# Patient Record
Sex: Female | Born: 1965 | Race: Black or African American | Hispanic: No | Marital: Single | Smoking: Never smoker
Health system: Southern US, Community
[De-identification: ages and names within clinical notes are randomized; demographics above are authoritative.]

## PROBLEM LIST (undated history)

## (undated) DIAGNOSIS — I509 Heart failure, unspecified: Secondary | ICD-10-CM

## (undated) DIAGNOSIS — I471 Supraventricular tachycardia: Secondary | ICD-10-CM

## (undated) DIAGNOSIS — I499 Cardiac arrhythmia, unspecified: Secondary | ICD-10-CM

## (undated) DIAGNOSIS — I110 Hypertensive heart disease with heart failure: Secondary | ICD-10-CM

## (undated) DIAGNOSIS — Z95810 Presence of automatic (implantable) cardiac defibrillator: Secondary | ICD-10-CM

## (undated) DIAGNOSIS — I4719 Other supraventricular tachycardia: Secondary | ICD-10-CM

## (undated) DIAGNOSIS — N920 Excessive and frequent menstruation with regular cycle: Secondary | ICD-10-CM

## (undated) DIAGNOSIS — I5022 Chronic systolic (congestive) heart failure: Secondary | ICD-10-CM

## (undated) DIAGNOSIS — D649 Anemia, unspecified: Secondary | ICD-10-CM

## (undated) DIAGNOSIS — I10 Essential (primary) hypertension: Secondary | ICD-10-CM

## (undated) HISTORY — PX: TUBAL LIGATION: SHX77

## (undated) HISTORY — DX: Excessive and frequent menstruation with regular cycle: N92.0

## (undated) HISTORY — DX: Other supraventricular tachycardia: I47.19

## (undated) HISTORY — DX: Chronic systolic (congestive) heart failure: I50.22

## (undated) HISTORY — DX: Supraventricular tachycardia: I47.1

## (undated) HISTORY — DX: Hypertensive heart disease with heart failure: I11.0

## (undated) HISTORY — PX: APPENDECTOMY: SHX54

---

## 1999-05-02 ENCOUNTER — Inpatient Hospital Stay (HOSPITAL_COMMUNITY): Admission: EM | Admit: 1999-05-02 | Discharge: 1999-05-04 | Payer: Self-pay | Admitting: Emergency Medicine

## 1999-05-02 ENCOUNTER — Encounter: Payer: Self-pay | Admitting: General Surgery

## 1999-05-03 ENCOUNTER — Encounter: Payer: Self-pay | Admitting: General Surgery

## 2002-07-30 ENCOUNTER — Inpatient Hospital Stay (HOSPITAL_COMMUNITY): Admission: EM | Admit: 2002-07-30 | Discharge: 2002-07-31 | Payer: Self-pay | Admitting: Emergency Medicine

## 2002-07-30 ENCOUNTER — Encounter: Payer: Self-pay | Admitting: Orthopedic Surgery

## 2008-09-28 ENCOUNTER — Ambulatory Visit: Payer: Self-pay | Admitting: Internal Medicine

## 2008-09-28 LAB — CONVERTED CEMR LAB
Basophils Relative: 0 % (ref 0–1)
Direct LDL: 55 mg/dL
Lymphs Abs: 2.2 10*3/uL (ref 0.7–4.0)
Monocytes Relative: 4 % (ref 3–12)
Neutro Abs: 6.1 10*3/uL (ref 1.7–7.7)
Neutrophils Relative %: 67 % (ref 43–77)
Platelets: 374 10*3/uL (ref 150–400)
RBC: 4.2 M/uL (ref 3.87–5.11)
TSH: 1.681 microintl units/mL (ref 0.350–4.50)
WBC: 9 10*3/uL (ref 4.0–10.5)

## 2009-01-31 ENCOUNTER — Emergency Department (HOSPITAL_COMMUNITY): Admission: EM | Admit: 2009-01-31 | Discharge: 2009-01-31 | Payer: Self-pay | Admitting: Emergency Medicine

## 2009-02-02 ENCOUNTER — Emergency Department (HOSPITAL_COMMUNITY): Admission: EM | Admit: 2009-02-02 | Discharge: 2009-02-02 | Payer: Self-pay | Admitting: Family Medicine

## 2011-03-25 LAB — CULTURE, ROUTINE-ABSCESS
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
## 2012-03-08 HISTORY — PX: CARDIAC CATHETERIZATION: SHX172

## 2012-03-29 ENCOUNTER — Inpatient Hospital Stay (HOSPITAL_COMMUNITY)
Admission: EM | Admit: 2012-03-29 | Discharge: 2012-04-02 | DRG: 287 | Disposition: A | Discharge status: Home / Self Care | Payer: 59 | Attending: Cardiology | Admitting: Cardiology

## 2012-03-29 ENCOUNTER — Encounter (HOSPITAL_COMMUNITY): Payer: Self-pay | Admitting: *Deleted

## 2012-03-29 ENCOUNTER — Emergency Department (HOSPITAL_COMMUNITY): Payer: Self-pay

## 2012-03-29 DIAGNOSIS — I428 Other cardiomyopathies: Secondary | ICD-10-CM | POA: Diagnosis present

## 2012-03-29 DIAGNOSIS — N920 Excessive and frequent menstruation with regular cycle: Secondary | ICD-10-CM | POA: Diagnosis present

## 2012-03-29 DIAGNOSIS — Z7982 Long term (current) use of aspirin: Secondary | ICD-10-CM

## 2012-03-29 DIAGNOSIS — R9431 Abnormal electrocardiogram [ECG] [EKG]: Secondary | ICD-10-CM | POA: Diagnosis not present

## 2012-03-29 DIAGNOSIS — D500 Iron deficiency anemia secondary to blood loss (chronic): Secondary | ICD-10-CM | POA: Diagnosis present

## 2012-03-29 DIAGNOSIS — Z79899 Other long term (current) drug therapy: Secondary | ICD-10-CM

## 2012-03-29 DIAGNOSIS — R0600 Dyspnea, unspecified: Secondary | ICD-10-CM | POA: Diagnosis present

## 2012-03-29 DIAGNOSIS — I5021 Acute systolic (congestive) heart failure: Principal | ICD-10-CM | POA: Diagnosis present

## 2012-03-29 DIAGNOSIS — I509 Heart failure, unspecified: Secondary | ICD-10-CM

## 2012-03-29 DIAGNOSIS — D649 Anemia, unspecified: Secondary | ICD-10-CM | POA: Diagnosis present

## 2012-03-29 HISTORY — DX: Heart failure, unspecified: I50.9

## 2012-03-29 HISTORY — DX: Anemia, unspecified: D64.9

## 2012-03-29 LAB — COMPREHENSIVE METABOLIC PANEL
ALT: 32 U/L (ref 0–35)
BUN: 13 mg/dL (ref 6–23)
Calcium: 9.4 mg/dL (ref 8.4–10.5)
Creatinine, Ser: 0.65 mg/dL (ref 0.50–1.10)
GFR calc Af Amer: >90 mL/min (ref >90)
GFR calc non Af Amer: >90 mL/min (ref >90)
Glucose, Bld: 107 mg/dL — ABNORMAL HIGH (ref 70–99)
Sodium: 138 mEq/L (ref 135–145)
Total Protein: 6.9 g/dL (ref 6.0–8.3)

## 2012-03-29 LAB — RETICULOCYTES
RBC.: 4.19 MIL/uL (ref 3.87–5.11)
Retic Ct Pct: 3.4 % — ABNORMAL HIGH (ref 0.4–3.1)

## 2012-03-29 LAB — POCT I-STAT TROPONIN I: Troponin i, poc: 0.03 ng/mL (ref 0.00–0.08)

## 2012-03-29 LAB — PRO B NATRIURETIC PEPTIDE: Pro B Natriuretic peptide (BNP): 1829 pg/mL — ABNORMAL HIGH (ref 0–125)

## 2012-03-29 LAB — CBC
Hemoglobin: 11.6 g/dL — ABNORMAL LOW (ref 12.0–15.0)
Hemoglobin: 10.8 g/dL — ABNORMAL LOW (ref 12.0–15.0)
MCH: 25.9 pg — ABNORMAL LOW (ref 26.0–34.0)
MCH: 25.8 pg — ABNORMAL LOW (ref 26.0–34.0)
MCV: 79 fL (ref 78.0–100.0)
Platelets: 353 10*3/uL (ref 150–400)
RBC: 4.48 MIL/uL (ref 3.87–5.11)
RBC: 4.19 MIL/uL (ref 3.87–5.11)

## 2012-03-29 LAB — BASIC METABOLIC PANEL
CO2: 21 mEq/L (ref 19–32)
Calcium: 9.4 mg/dL (ref 8.4–10.5)
Creatinine, Ser: 0.65 mg/dL (ref 0.50–1.10)
GFR calc Af Amer: >90 mL/min (ref >90)
GFR calc non Af Amer: >90 mL/min (ref >90)
Glucose, Bld: 106 mg/dL — ABNORMAL HIGH (ref 70–99)

## 2012-03-29 LAB — TROPONIN I: Troponin I: <0.3 ng/mL (ref <0.30)

## 2012-03-29 LAB — PROTIME-INR
INR: 1.05 (ref 0.00–1.49)
Prothrombin Time: 13.9 seconds (ref 11.6–15.2)

## 2012-03-29 LAB — MAGNESIUM: Magnesium: 1.9 mg/dL (ref 1.5–2.5)

## 2012-03-29 LAB — APTT: aPTT: 30 seconds (ref 24–37)

## 2012-03-29 MED ORDER — ACETAMINOPHEN 325 MG PO TABS: 650.0000 mg | ORAL_TABLET | ORAL | Status: DC | PRN

## 2012-03-29 MED ORDER — GUAIFENESIN ER 600 MG PO TB12
600.0000 mg | ORAL_TABLET | Freq: Two times a day (BID) | ORAL | Status: DC
  Administered 2012-03-29 – 2012-04-02 (×8): 600 mg via ORAL
  Filled 2012-03-29 (×10): qty 1

## 2012-03-29 MED ORDER — CARVEDILOL 3.125 MG PO TABS
3.1250 mg | ORAL_TABLET | Freq: Two times a day (BID) | ORAL | Status: DC
  Administered 2012-03-29 – 2012-04-02 (×8): 3.125 mg via ORAL
  Filled 2012-03-29 (×9): qty 1

## 2012-03-29 MED ORDER — FUROSEMIDE 10 MG/ML IJ SOLN
40.0000 mg | Freq: Once | INTRAMUSCULAR | Status: AC
  Administered 2012-03-29: 40 mg via INTRAVENOUS
  Filled 2012-03-29: qty 4

## 2012-03-29 MED ORDER — CARVEDILOL 3.125 MG PO TABS
3.1250 mg | ORAL_TABLET | Freq: Two times a day (BID) | ORAL | Status: DC
  Filled 2012-03-29 (×2): qty 1

## 2012-03-29 MED ORDER — HYDROCODONE-ACETAMINOPHEN 5-325 MG PO TABS
1.0000 | ORAL_TABLET | Freq: Four times a day (QID) | ORAL | Status: AC | PRN
  Administered 2012-03-29 – 2012-03-30 (×2): 1 via ORAL
  Filled 2012-03-29 (×2): qty 1

## 2012-03-29 MED ORDER — ACETAMINOPHEN 325 MG PO TABS
ORAL_TABLET | ORAL | Status: AC
Start: 2012-03-29 — End: 2012-03-29
  Administered 2012-03-29: 650 mg via ORAL
  Filled 2012-03-29: qty 2

## 2012-03-29 MED ORDER — ASPIRIN 325 MG PO TABS
325.0000 mg | ORAL_TABLET | ORAL | Status: AC
  Administered 2012-03-29: 325 mg via ORAL
  Filled 2012-03-29: qty 1

## 2012-03-29 MED ORDER — ASPIRIN 81 MG PO CHEW: 324.0000 mg | CHEWABLE_TABLET | Freq: Once | ORAL | Status: DC

## 2012-03-29 MED ORDER — FUROSEMIDE 10 MG/ML IJ SOLN
40.0000 mg | Freq: Two times a day (BID) | INTRAMUSCULAR | Status: DC
  Administered 2012-03-29 – 2012-03-30 (×3): 40 mg via INTRAVENOUS
  Filled 2012-03-29 (×6): qty 4

## 2012-03-29 MED ORDER — ACETAMINOPHEN 325 MG PO TABS
650.0000 mg | ORAL_TABLET | Freq: Four times a day (QID) | ORAL | Status: DC | PRN
  Administered 2012-03-29: 650 mg via ORAL

## 2012-03-29 MED ORDER — SODIUM CHLORIDE 0.9 % IJ SOLN
3.0000 mL | INTRAMUSCULAR | Status: DC | PRN
  Administered 2012-03-31: 3 mL via INTRAVENOUS

## 2012-03-29 MED ORDER — POTASSIUM CHLORIDE CRYS ER 20 MEQ PO TBCR
20.0000 meq | EXTENDED_RELEASE_TABLET | Freq: Once | ORAL | Status: AC
  Administered 2012-03-30: 20 meq via ORAL

## 2012-03-29 MED ORDER — LISINOPRIL 5 MG PO TABS
5.0000 mg | ORAL_TABLET | Freq: Two times a day (BID) | ORAL | Status: DC
  Administered 2012-03-29 – 2012-04-02 (×8): 5 mg via ORAL
  Filled 2012-03-29 (×9): qty 1

## 2012-03-29 MED ORDER — HEPARIN SODIUM (PORCINE) 5000 UNIT/ML IJ SOLN
5000.0000 [IU] | Freq: Three times a day (TID) | INTRAMUSCULAR | Status: DC
  Administered 2012-03-29 – 2012-04-02 (×11): 5000 [IU] via SUBCUTANEOUS
  Filled 2012-03-29 (×14): qty 1

## 2012-03-29 MED ORDER — ASPIRIN EC 81 MG PO TBEC
81.0000 mg | DELAYED_RELEASE_TABLET | Freq: Every day | ORAL | Status: DC
  Administered 2012-03-29 – 2012-04-01 (×4): 81 mg via ORAL
  Filled 2012-03-29 (×4): qty 1

## 2012-03-29 MED ORDER — SODIUM CHLORIDE 0.9 % IV SOLN: 250.0000 mL | INTRAVENOUS | Status: DC | PRN

## 2012-03-29 MED ORDER — SODIUM CHLORIDE 0.9 % IJ SOLN
3.0000 mL | Freq: Two times a day (BID) | INTRAMUSCULAR | Status: DC
  Administered 2012-03-29 – 2012-04-01 (×7): 3 mL via INTRAVENOUS

## 2012-03-29 MED ORDER — ZOLPIDEM TARTRATE 5 MG PO TABS
5.0000 mg | ORAL_TABLET | Freq: Every evening | ORAL | Status: AC | PRN
  Administered 2012-03-30 – 2012-03-31 (×3): 5 mg via ORAL
  Filled 2012-03-29 (×3): qty 1

## 2012-03-29 MED ORDER — POTASSIUM CHLORIDE CRYS ER 20 MEQ PO TBCR
20.0000 meq | EXTENDED_RELEASE_TABLET | Freq: Two times a day (BID) | ORAL | Status: DC
  Administered 2012-03-30: 20 meq via ORAL
  Filled 2012-03-29 (×2): qty 1
  Filled 2012-03-29: qty 2

## 2012-03-29 MED ORDER — ONDANSETRON HCL 4 MG/2ML IJ SOLN: 4.0000 mg | Freq: Four times a day (QID) | INTRAMUSCULAR | Status: DC | PRN

## 2012-03-29 MED ORDER — ADULT MULTIVITAMIN W/MINERALS CH
1.0000 | ORAL_TABLET | Freq: Every day | ORAL | Status: DC
  Administered 2012-03-29 – 2012-04-02 (×5): 1 via ORAL
  Filled 2012-03-29 (×5): qty 1

## 2012-03-29 NOTE — ED Provider Notes (Signed)
History     CSN: [id number]  Arrival date & time 03/29/12  1359   First MD Initiated Contact with Patient 03/29/12 1458      Chief Complaint  Patient presents with  . Chest Pain  . Shortness of Breath    (Consider location/radiation/quality/duration/timing/severity/associated sxs/prior treatment) HPI Comments: Patient presents with one week of progressive shortness of breath, cough, chest pain has been getting worse over the past couple days. Her cough is worse at night and she has shortness of breath and difficulty lying flat. Chest pain is substernal radiates to both sides of her chest and lasts for one hour at a time. She denies any weakness, numbness, tingling. She's had no previous heart or lung problems. She's not smoke. She's been taking ibuprofen for chest pain with some relief. Her breathing worsens if she walks around and she has to rest after walking only a few feet.  The history is provided by the patient and the spouse.    History reviewed. No pertinent past medical history.  Past Surgical History  Procedure Date  . Tubal ligation     No family history on file.  History  Substance Use Topics  . Smoking status: Never Smoker   . Smokeless tobacco: Not on file  . Alcohol Use: Yes    OB History    Grav Para Term Preterm Abortions TAB SAB Ect Mult Living                  Review of Systems  Constitutional: Positive for activity change, appetite change and fatigue. Negative for fever.  HENT: Positive for congestion and rhinorrhea.   Eyes: Negative for visual disturbance.  Respiratory: Positive for cough and shortness of breath.   Cardiovascular: Positive for chest pain.  Gastrointestinal: Negative for nausea, vomiting and abdominal pain.  Genitourinary: Negative for dysuria, hematuria, vaginal bleeding and vaginal discharge.  Musculoskeletal: Negative for back pain.  Skin: Negative for rash.  Neurological: Negative for dizziness, weakness and headaches.     Allergies  Review of patient's allergies indicates no known allergies.  Home Medications   Current Outpatient Rx  Name Route Sig Dispense Refill  . IBUPROFEN 200 MG PO TABS Oral Take 200-400 mg by mouth every 6 (six) hours as needed. For pain/headache/fever    . ADULT MULTIVITAMIN W/MINERALS CH Oral Take 1 tablet by mouth daily.      BP 142/97  Pulse 116  Temp(Src) 98.5 F (36.9 C) (Oral)  Resp 24  SpO2 99%  LMP 03/22/2012  Physical Exam  Constitutional: She is oriented to person, place, and time. She appears well-developed and well-nourished. No distress.       Mild dyspnea with talking  HENT:  Head: Normocephalic and atraumatic.  Eyes: Conjunctivae and EOM are normal. Pupils are equal, round, and reactive to light.  Neck: Normal range of motion. JVD present.       JVD to  angle of jaw  Cardiovascular: Normal rate, regular rhythm and normal heart sounds.   Pulmonary/Chest: Effort normal. No respiratory distress. She has rales.       Bibasilar crackles, decrease breath sounds  Abdominal: Soft. There is no tenderness. There is no rebound and no guarding.  Musculoskeletal: Normal range of motion. She exhibits edema.       +2 pedal edema bilaterally, +2 DP and PT pulses  Neurological: She is alert and oriented to person, place, and time. No cranial nerve deficit.  Skin: Skin is warm.    ED Course  Procedures (including critical care time)  Labs Reviewed  CBC - Abnormal; Notable for the following:    Hemoglobin 11.6 (*)    HCT 35.4 (*)    MCH 25.9 (*)    RDW 16.8 (*)    All other components within normal limits  BASIC METABOLIC PANEL - Abnormal; Notable for the following:    Glucose, Bld 106 (*)    All other components within normal limits  PRO B NATRIURETIC PEPTIDE - Abnormal; Notable for the following:    Pro B Natriuretic peptide (BNP) 1829.0 (*)    All other components within normal limits  POCT I-STAT TROPONIN I  TROPONIN I  D-DIMER, QUANTITATIVE   MAGNESIUM   Dg Chest 2 View  03/29/2012  *RADIOLOGY REPORT*  Clinical Data: Chest pain and shortness of breath.  Cough.  CHEST - 2 VIEW  Comparison: None.  Findings: Patchy airspace disease is seen at the lung bases with small bilateral pleural effusions. The cardiopericardial silhouette is enlarged. Imaged bony structures of the thorax are intact.  IMPRESSION: Cardiomegaly with vascular congestion, basilar interstitial opacity and small bilateral pleural effusions. Failure would be a consideration.  Original Report Authenticated By: ERIC A. MANSELL, M.D.     1. CHF (congestive heart failure)   2. Prolonged Q-T interval on ECG       MDM  Progressive shortness of breath with dyspnea, orthopnea, PND and chest pain.  Bibasilar opacities on chest x-ray. Peripheral edema with elevated JVD. Suspect new-onset congestive heart failure. Will administer aspirin and Lasix.  Prolonged QT on EKG monitor and etiology. Magnesium normal. Admission d/w [facility].   Date: 03/29/2012  Rate: 110  Rhythm: sinus tachycardia  QRS Axis: left  Intervals: QT prolonged  ST/T Wave abnormalities: nonspecific ST/T changes  Conduction Disutrbances:right bundle branch block  Narrative Interpretation:   Old EKG Reviewed: none available        Glynn Octave, MD 03/30/12 [phone]

## 2012-03-29 NOTE — ED Notes (Signed)
Card MD at BS 

## 2012-03-29 NOTE — ED Notes (Signed)
Pt to floor on stretcher & monitor with RN & family.

## 2012-03-29 NOTE — ED Notes (Signed)
Card MD, returned page, will see, pending arrival.

## 2012-03-29 NOTE — ED Notes (Signed)
Family at [facility], no changes, eating, NAD, calm, admitting MD paged again for pain med orders, waiting on other meds from pharmacy.
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
## 2012-03-29 NOTE — Progress Notes (Signed)
RN called because pt is requesting something for sleep.  Pt takes unisom for sleep at home, ambien 5 prn ordered.  Pt's CP is better after vicodin.  K 3.3, pt has KCL 20 meq ordered, will give an additional 20 meq.  Cr is stable.

## 2012-03-29 NOTE — ED Notes (Signed)
Unable to transfer/give report on patient at this time due to shift change.

## 2012-03-29 NOTE — ED Notes (Signed)
Patient with cold for 1 week.  She reports onset of sob and chest pain on yesterday.  She reports some production with her cough.  She has been taking ibuprofen so she is unsure if she has had a fever.

## 2012-03-29 NOTE — ED Notes (Signed)
4700 not able to take report at this time.

## 2012-03-29 NOTE — ED Notes (Signed)
C/o CP, admitting MD paged for orders.

## 2012-03-29 NOTE — ED Notes (Signed)
[id number] Ready

## 2012-03-29 NOTE — ED Notes (Signed)
Patient C/O chest pain and shortness of breath that began 1 week ago. States that her appetite has waned over the past few days. Chest pain is mid sternal, without radiation.  Pressure on sternum exacerbates pain,. Crackles noted in her left posterior lung. C/O productive cough with yellow sputum.  Does not know if she has had a fever.

## 2012-03-29 NOTE — Progress Notes (Signed)
Called by ED RN for chest pain.  On eval of pt - she reports she has had chest pain for months, this pain is similar.  The pain is in the center of her chest, reproducible on palpation of her chest, worse with cough.  She reports that NSAIDs help the pain.  Given admission for presumed CHF, will not give NSAIDs.  Try tylenol now and pt can take vicodin if this does not help.  EKG on admission with nonspec ST changes and trop negative.

## 2012-03-30 DIAGNOSIS — I509 Heart failure, unspecified: Secondary | ICD-10-CM

## 2012-03-30 LAB — BASIC METABOLIC PANEL
Calcium: 9.1 mg/dL (ref 8.4–10.5)
Calcium: 9.2 mg/dL (ref 8.4–10.5)
GFR calc Af Amer: >90 mL/min (ref >90)
GFR calc Af Amer: >90 mL/min (ref >90)
GFR calc non Af Amer: >90 mL/min (ref >90)
GFR calc non Af Amer: >90 mL/min (ref >90)
Glucose, Bld: 146 mg/dL — ABNORMAL HIGH (ref 70–99)
Potassium: 3.6 mEq/L (ref 3.5–5.1)
Potassium: 3.5 mEq/L (ref 3.5–5.1)
Sodium: 138 mEq/L (ref 135–145)
Sodium: 138 mEq/L (ref 135–145)

## 2012-03-30 LAB — DIFFERENTIAL
Basophils Absolute: 0 10*3/uL (ref 0.0–0.1)
Eosinophils Relative: 3 % (ref 0–5)
Lymphocytes Relative: 20 % (ref 12–46)
Lymphs Abs: 1.5 10*3/uL (ref 0.7–4.0)
Neutro Abs: 5.2 10*3/uL (ref 1.7–7.7)
Neutrophils Relative %: 71 % (ref 43–77)

## 2012-03-30 LAB — CARDIAC PANEL(CRET KIN+CKTOT+MB+TROPI)
CK, MB: 3 ng/mL (ref 0.3–4.0)
CK, MB: 2.6 ng/mL (ref 0.3–4.0)
Relative Index: INVALID (ref 0.0–2.5)
Relative Index: INVALID (ref 0.0–2.5)
Total CK: 82 U/L (ref 7–177)
Total CK: 76 U/L (ref 7–177)
Troponin I: <0.3 ng/mL (ref <0.30)

## 2012-03-30 LAB — FOLATE: Folate: 11.2 ng/mL

## 2012-03-30 LAB — CBC
Platelets: 344 10*3/uL (ref 150–400)
RBC: 4.13 MIL/uL (ref 3.87–5.11)
RDW: 16.8 % — ABNORMAL HIGH (ref 11.5–15.5)
WBC: 7.4 10*3/uL (ref 4.0–10.5)

## 2012-03-30 LAB — IRON AND TIBC
Saturation Ratios: 6 % — ABNORMAL LOW (ref 20–55)
TIBC: 358 ug/dL (ref 250–470)

## 2012-03-30 LAB — LIPID PANEL
Cholesterol: 124 mg/dL (ref 0–200)
Total CHOL/HDL Ratio: 2.5 RATIO

## 2012-03-30 LAB — HEMOGLOBIN A1C
Hgb A1c MFr Bld: 5.8 % — ABNORMAL HIGH (ref <5.7)
Mean Plasma Glucose: 120 mg/dL — ABNORMAL HIGH (ref <117)

## 2012-03-30 LAB — MAGNESIUM: Magnesium: 1.8 mg/dL (ref 1.5–2.5)

## 2012-03-30 LAB — VITAMIN B12: Vitamin B-12: 319 pg/mL (ref 211–911)

## 2012-03-30 MED ORDER — POTASSIUM CHLORIDE CRYS ER 20 MEQ PO TBCR
20.0000 meq | EXTENDED_RELEASE_TABLET | Freq: Three times a day (TID) | ORAL | Status: DC
  Administered 2012-03-30 – 2012-04-02 (×10): 20 meq via ORAL
  Filled 2012-03-30 (×12): qty 1

## 2012-03-30 NOTE — Progress Notes (Signed)
  Echocardiogram 2D Echocardiogram has been performed.  Sydney Shaffer 03/30/2012, 1:44 PM

## 2012-03-30 NOTE — Progress Notes (Signed)
Subjective:  She is less dyspneic this am.  Voided large amount overnight. Repeat weight pending. Cough and chest soreness is better. Sputum is less and clearer in color. 2D echo pending.  Objective:  Vital Signs in the last 24 hours: Temp:  [97.2 F (36.2 C)-99 F (37.2 C)] 97.9 F (36.6 C) (04/23 0610) Pulse Rate:  [81-116] 97  (04/23 0610) Resp:  [18-30] 18  (04/23 0610) BP: (101-146)/(65-118) 112/73 mmHg (04/23 0610) SpO2:  [95 %-100 %] 100 % (04/23 0610) Weight:  [88.2 kg (194 lb 7.1 oz)] 88.2 kg (194 lb 7.1 oz) (04/22 2115)  Intake/Output from previous day: 04/22 0701 - 04/23 0700 In: 484 [P.O.:480; IV Piggyback:4] Out: 2700 [Urine:2700] Intake/Output from this shift:       . aspirin EC  81 mg Oral Daily  . aspirin  325 mg Oral STAT  . carvedilol  3.125 mg Oral Q12H  . furosemide  40 mg Intravenous Once  . furosemide  40 mg Intravenous Q12H  . guaiFENesin  600 mg Oral BID  . heparin  5,000 Units Subcutaneous Q8H  . lisinopril  5 mg Oral BID  . mulitivitamin with minerals  1 tablet Oral Daily  . potassium chloride  20 mEq Oral BID  . potassium chloride  20 mEq Oral Once  . sodium chloride  3 mL Intravenous Q12H  . DISCONTD: aspirin  324 mg Oral Once  . DISCONTD: carvedilol  3.125 mg Oral BID WC      Physical Exam: The patient appears to be in no distress.  Head and neck exam reveals that the pupils are equal and reactive.  The extraocular movements are full.  There is no scleral icterus.  Mouth and pharynx are benign.  No lymphadenopathy.  No carotid bruits.  The jugular venous pressure is normal.  Thyroid is not enlarged or tender.  Chest reveals bilateral rales unchanged from admission.  Heart reveals no abnormal lift or heave.  First and second heart sounds are normal.  There is no murmur gallop rub or click. The S3 gallop heard on admission is no longer present.  The abdomen is soft. The hepatic tenderness is less.  Bowel sounds are normoactive.  There  is no hepatosplenomegaly or mass.  There are no abdominal bruits.  Extremities reveal no phlebitis .  1-2+ edema bilaterally. Pedal pulses are good.  There is no cyanosis or clubbing.  Neurologic exam is normal strength and no lateralizing weakness.  No sensory deficits.  Integument reveals no rash  Lab Results:  Basename 03/29/12 2200 03/29/12 1430  WBC 10.8* 10.1  HGB 10.8* 11.6*  PLT 353 348    Basename 03/30/12 0700 03/29/12 2200  NA 138 138  K 3.6 3.3*  CL 104 104  CO2 25 25  GLUCOSE 94 107*  BUN 12 13  CREATININE 0.66 0.65    Basename 03/30/12 0700 03/29/12 2155  TROPONINI <0.30 <0.30   Hepatic Function Panel  Basename 03/29/12 2200  PROT 6.9  ALBUMIN 3.3*  AST 30  ALT 32  ALKPHOS 75  BILITOT 0.9  BILIDIR --  IBILI --    Basename 03/30/12 0700  CHOL 124   No results found for this basename: PROTIME in the last 72 hours  Imaging: Dg Chest 2 View  03/29/2012  *RADIOLOGY REPORT*  Clinical Data: Chest pain and shortness of breath.  Cough.  CHEST - 2 VIEW  Comparison: None.  Findings: Patchy airspace disease is seen at the lung bases with small bilateral  pleural effusions. The cardiopericardial silhouette is enlarged. Imaged bony structures of the thorax are intact.  IMPRESSION: Cardiomegaly with vascular congestion, basilar interstitial opacity and small bilateral pleural effusions. Failure would be a consideration.  Original Report Authenticated By: ERIC A. MANSELL, M.D.    Cardiac Studies: Telemetry shows NSR.  3 beats VT last night. Assessment/Plan:  Patient Active Hospital Problem List: Dyspnea (03/29/2012)   Assessment: Improving   Plan: Continue IV lasix.  Continue mucinex.  Increase Kdur  Congestive heart failure (03/29/2012)   Assessment: Improving   Plan: 2 D echo pending  Anemia (03/29/2012)   Assessment: No history of sickle cell.     Plan: Repeat CBC pending. Anemia panel pending.   LOS: 1 day    Sydney Shaffer 03/30/2012, 9:06  AM

## 2012-03-30 NOTE — Progress Notes (Signed)
Pt had 3 beats of vtach.  Pt asymptomatic at the time resting comfortably in bed.  MD has been made aware.  Will continue to monitor. Nino Glow RN

## 2012-03-30 NOTE — Plan of Care (Signed)
Problem: Phase I Progression Outcomes Goal: Voiding-avoid urinary catheter unless indicated Outcome: Completed/Met Date Met:  03/30/12 Patient voids using BSC

## 2012-03-30 NOTE — Progress Notes (Signed)
UR Completed. Araya Roel, RN, Nurse Case Manager [phone]     

## 2012-03-31 ENCOUNTER — Inpatient Hospital Stay (HOSPITAL_COMMUNITY): Payer: Self-pay

## 2012-03-31 ENCOUNTER — Other Ambulatory Visit: Payer: Self-pay

## 2012-03-31 LAB — CARDIAC PANEL(CRET KIN+CKTOT+MB+TROPI)
Relative Index: INVALID (ref 0.0–2.5)
Total CK: 74 U/L (ref 7–177)
Total CK: 73 U/L (ref 7–177)

## 2012-03-31 LAB — BASIC METABOLIC PANEL
CO2: 26 mEq/L (ref 19–32)
Calcium: 9.3 mg/dL (ref 8.4–10.5)
Chloride: 102 mEq/L (ref 96–112)
Glucose, Bld: 95 mg/dL (ref 70–99)
Potassium: 4.1 mEq/L (ref 3.5–5.1)
Sodium: 138 mEq/L (ref 135–145)

## 2012-03-31 MED ORDER — FERROUS SULFATE 325 (65 FE) MG PO TABS
325.0000 mg | ORAL_TABLET | Freq: Every day | ORAL | Status: DC
  Administered 2012-04-01 – 2012-04-02 (×2): 325 mg via ORAL
  Filled 2012-03-31 (×3): qty 1

## 2012-03-31 MED ORDER — MORPHINE SULFATE 2 MG/ML IJ SOLN
2.0000 mg | INTRAMUSCULAR | Status: DC | PRN
  Administered 2012-03-31: 2 mg via INTRAVENOUS
  Filled 2012-03-31: qty 1

## 2012-03-31 MED ORDER — FUROSEMIDE 40 MG PO TABS
40.0000 mg | ORAL_TABLET | Freq: Two times a day (BID) | ORAL | Status: DC
  Administered 2012-03-31 (×2): 40 mg via ORAL
  Filled 2012-03-31 (×5): qty 1

## 2012-03-31 NOTE — Progress Notes (Signed)
Pt c/o chest discomfort on both sides of upper chest.  Dr.McAlhany in to see pt  Notified of EKG NSR< possible Lt. Atrial enlargement, Lt. Axis deviation, Lt ventricular hypertrophy, T waave abnormality, consider inferolateral ischemia.  Prolonged QT.  Notified of BP 97/51, P83.  PRN mso4 to be given.  Amanda Pea, Charity fundraiser.

## 2012-03-31 NOTE — Progress Notes (Signed)
Pt co/o pain to to upper chest wall on both sides.  R Barrett notified and order EKG and stated will notify Dr. Clifton James. Will cont. To monitor.  EKG to be done.  Amanda Pea, Charity fundraiser.

## 2012-03-31 NOTE — Progress Notes (Signed)
Called to see patient who is having active chest pain. Pt appears to be in mild distress. BP 97/59. HR 83. EKG unchanged with diffuse T wave inversions. Examination shows reproducibility of chest pain with palpation over the sternum. Lungs clear. No evidence of loud murmurs. C xray this am with resolving pulm edema. Echo with LVEF of 20-25%.   Based on objective evidence, I do not think her chest pain is cardiac although her LV systolic function is reduced on admission. Will give morphine now. Will check EKG in am. Will cycle cardiac markers (which have been negative during admission).   Further plans per Dr. Patty Sermons in am. Stress Celine Ahr has been planned to exclude ischemia.   Jarreau Callanan 3:49 PM 03/31/2012

## 2012-03-31 NOTE — Progress Notes (Addendum)
Subjective:  She is less dyspneic this am.  Voided large amount overnight.Weight is down only 2 lb since admission but her edema is much improved. Cough and chest soreness is better. Sputum is less and clearer in color. 2D echo done yesterday but no report in chart yet. Serum iron is low.  She gives history of heavy menses. Will add FeSO4  Objective:  Vital Signs in the last 24 hours: Temp:  [97.8 F (36.6 C)-98.2 F (36.8 C)] 98.2 F (36.8 C) (04/24 0530) Pulse Rate:  [80-89] 86  (04/24 0530) Resp:  [16-18] 16  (04/24 0530) BP: (91-112)/(58-81) 91/58 mmHg (04/24 0530) SpO2:  [96 %-100 %] 96 % (04/24 0530) Weight:  [87.2 kg (192 lb 3.9 oz)-88.3 kg (194 lb 10.7 oz)] 87.2 kg (192 lb 3.9 oz) (04/24 0530)  Intake/Output from previous day: 04/23 0701 - 04/24 0700 In: 1211 [P.O.:1200; I.V.:3; IV Piggyback:8] Out: 550 [Urine:550] Intake/Output from this shift:       . aspirin EC  81 mg Oral Daily  . carvedilol  3.125 mg Oral Q12H  . ferrous sulfate  325 mg Oral Q breakfast  . furosemide  40 mg Oral BID  . guaiFENesin  600 mg Oral BID  . heparin  5,000 Units Subcutaneous Q8H  . lisinopril  5 mg Oral BID  . mulitivitamin with minerals  1 tablet Oral Daily  . potassium chloride  20 mEq Oral TID  . sodium chloride  3 mL Intravenous Q12H  . DISCONTD: furosemide  40 mg Intravenous Q12H  . DISCONTD: potassium chloride  20 mEq Oral BID      Physical Exam: The patient appears to be in no distress.  Head and neck exam reveals that the pupils are equal and reactive.  The extraocular movements are full.  There is no scleral icterus.  Mouth and pharynx are benign.  No lymphadenopathy.  No carotid bruits.  The jugular venous pressure is normal.  Thyroid is not enlarged or tender.  Chest reveals bilateral rales unchanged from admission.  Heart reveals no abnormal lift or heave.  First and second heart sounds are normal.  There is no murmur gallop rub or click. The S3 gallop heard on  admission is no longer present.  The abdomen is soft. The hepatic tenderness is less.  Bowel sounds are normoactive.  There is no hepatosplenomegaly or mass.  There are no abdominal bruits.  Extremities reveal no phlebitis .  trace edema bilaterally. Pedal pulses are good.  There is no cyanosis or clubbing.  Neurologic exam is normal strength and no lateralizing weakness.  No sensory deficits.  Integument reveals no rash  Lab Results:  Basename 03/30/12 0857 03/29/12 2200  WBC 7.4 10.8*  HGB 10.6* 10.8*  PLT 344 353    Basename 03/31/12 0540 03/30/12 0857  NA 138 138  K 4.1 3.5  CL 102 103  CO2 26 27  GLUCOSE 95 146*  BUN 16 13  CREATININE 0.78 0.72    Basename 03/30/12 1443 03/30/12 0700  TROPONINI <0.30 <0.30   Hepatic Function Panel  Basename 03/29/12 2200  PROT 6.9  ALBUMIN 3.3*  AST 30  ALT 32  ALKPHOS 75  BILITOT 0.9  BILIDIR --  IBILI --    Basename 03/30/12 0700  CHOL 124   No results found for this basename: PROTIME in the last 72 hours  Imaging: Dg Chest 2 View  03/29/2012  *RADIOLOGY REPORT*  Clinical Data: Chest pain and shortness of breath.  Cough.  CHEST - 2 VIEW  Comparison: None.  Findings: Patchy airspace disease is seen at the lung bases with small bilateral pleural effusions. The cardiopericardial silhouette is enlarged. Imaged bony structures of the thorax are intact.  IMPRESSION: Cardiomegaly with vascular congestion, basilar interstitial opacity and small bilateral pleural effusions. Failure would be a consideration.  Original Report Authenticated By: ERIC A. MANSELL, M.D.    Cardiac Studies: Telemetry shows NSR.  Assessment/Plan:  Patient Active Hospital Problem List: Dyspnea (03/29/2012)   Assessment: Improving   Plan: Change to oral lasix  Congestive heart failure (03/29/2012)   Assessment: Improving   Plan: Chest xray today. Track down echo results.  Anemia (03/29/2012)   Assessment: Iron deficiency probably secondary to heavy  menstrual flow.    Plan: Begin iron  Walk in hall today.  Make O2 prn only.  Possibly home in am  Addendum.  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  She has Dx of acute systolic CHF. For some reason echo report not showing up in results.  Printed report in shadow chart. Will do treadmill myoview to evaluate further.   LOS: 2 days    Cassell Clement 03/31/2012, 8:23 AM

## 2012-04-01 ENCOUNTER — Other Ambulatory Visit: Payer: Self-pay

## 2012-04-01 ENCOUNTER — Inpatient Hospital Stay (HOSPITAL_COMMUNITY): Payer: Self-pay

## 2012-04-01 DIAGNOSIS — R079 Chest pain, unspecified: Secondary | ICD-10-CM

## 2012-04-01 LAB — CARDIAC PANEL(CRET KIN+CKTOT+MB+TROPI)
Relative Index: INVALID (ref 0.0–2.5)
Total CK: 59 U/L (ref 7–177)

## 2012-04-01 LAB — BASIC METABOLIC PANEL
BUN: 18 mg/dL (ref 6–23)
CO2: 29 mEq/L (ref 19–32)
Calcium: 9.5 mg/dL (ref 8.4–10.5)
Creatinine, Ser: 0.94 mg/dL (ref 0.50–1.10)
Glucose, Bld: 98 mg/dL (ref 70–99)

## 2012-04-01 MED ORDER — ASPIRIN EC 81 MG PO TBEC: 81.0000 mg | DELAYED_RELEASE_TABLET | Freq: Every day | ORAL | Status: DC

## 2012-04-01 MED ORDER — FUROSEMIDE 40 MG PO TABS
40.0000 mg | ORAL_TABLET | Freq: Every day | ORAL | Status: DC
  Administered 2012-04-01 – 2012-04-02 (×2): 40 mg via ORAL
  Filled 2012-04-01: qty 1

## 2012-04-01 MED ORDER — TECHNETIUM TC 99M TETROFOSMIN IV KIT
10.0000 | PACK | Freq: Once | INTRAVENOUS | Status: AC | PRN
  Administered 2012-04-01: 10 via INTRAVENOUS

## 2012-04-01 MED ORDER — REGADENOSON 0.4 MG/5ML IV SOLN
0.4000 mg | Freq: Once | INTRAVENOUS | Status: AC
  Administered 2012-04-01: 0.4 mg via INTRAVENOUS
  Filled 2012-04-01: qty 5

## 2012-04-01 MED ORDER — TECHNETIUM TC 99M TETROFOSMIN IV KIT
30.0000 | PACK | Freq: Once | INTRAVENOUS | Status: AC | PRN
  Administered 2012-04-01: 30 via INTRAVENOUS

## 2012-04-01 MED ORDER — ASPIRIN 81 MG PO CHEW
324.0000 mg | CHEWABLE_TABLET | ORAL | Status: AC
  Administered 2012-04-02: 324 mg via ORAL
  Filled 2012-04-01: qty 4

## 2012-04-01 NOTE — Progress Notes (Signed)
Subjective:  No further chest pain. Enzymes negative. Chest xray yesterday improved. Weight down 1 more lb.  BP borderline low. Will reduce lasix  Objective:  Vital Signs in the last 24 hours: Temp:  [98.1 F (36.7 C)-98.2 F (36.8 C)] 98.2 F (36.8 C) (04/25 0553) Pulse Rate:  [82-90] 88  (04/25 0553) Resp:  [20] 20  (04/25 0553) BP: (95-104)/(58-68) 95/58 mmHg (04/25 0553) SpO2:  [100 %] 100 % (04/25 0553) Weight:  [86.7 kg (191 lb 2.2 oz)] 86.7 kg (191 lb 2.2 oz) (04/25 0553)  Intake/Output from previous day: 04/24 0701 - 04/25 0700 In: 723 [P.O.:720; I.V.:3] Out: 600 [Urine:600] Intake/Output from this shift:       . aspirin EC  81 mg Oral Daily  . carvedilol  3.125 mg Oral Q12H  . ferrous sulfate  325 mg Oral Q breakfast  . furosemide  40 mg Oral BID  . guaiFENesin  600 mg Oral BID  . heparin  5,000 Units Subcutaneous Q8H  . lisinopril  5 mg Oral BID  . mulitivitamin with minerals  1 tablet Oral Daily  . potassium chloride  20 mEq Oral TID  . sodium chloride  3 mL Intravenous Q12H  . DISCONTD: furosemide  40 mg Intravenous Q12H      Physical Exam: The patient appears to be in no distress.  Head and neck exam reveals that the pupils are equal and reactive.  The extraocular movements are full.  There is no scleral icterus.  Mouth and pharynx are benign.  No lymphadenopathy.  No carotid bruits.  The jugular venous pressure is normal.  Thyroid is not enlarged or tender.  Chest reveals bilateral rales unchanged from admission.  Heart reveals no abnormal lift or heave.  First and second heart sounds are normal.  There is no murmur gallop rub or click. The S3 gallop heard on admission is no longer present.  The abdomen is soft. The hepatic tenderness is less.  Bowel sounds are normoactive.  There is no hepatosplenomegaly or mass.  There are no abdominal bruits.  Extremities reveal no phlebitis .  trace edema bilaterally. Pedal pulses are good.  There is no  cyanosis or clubbing.  Neurologic exam is normal strength and no lateralizing weakness.  No sensory deficits.  Integument reveals no rash  Lab Results:  Basename 03/30/12 0857 03/29/12 2200  WBC 7.4 10.8*  HGB 10.6* 10.8*  PLT 344 353    Basename 04/01/12 0351 03/31/12 0540  NA 137 138  K 4.3 4.1  CL 100 102  CO2 29 26  GLUCOSE 98 95  BUN 18 16  CREATININE 0.94 0.78    Basename 04/01/12 0347 03/31/12 2108  TROPONINI <0.30 <0.30   Hepatic Function Panel  Basename 03/29/12 2200  PROT 6.9  ALBUMIN 3.3*  AST 30  ALT 32  ALKPHOS 75  BILITOT 0.9  BILIDIR --  IBILI --    Basename 03/30/12 0700  CHOL 124   No results found for this basename: PROTIME in the last 72 hours  Imaging: Dg Chest 2 View  03/31/2012  *RADIOLOGY REPORT*  Clinical Data: CHF.  Shortness of breath and chest pain.  CHEST - 2 VIEW  Comparison: 03/29/2012  Findings: Lateral view degraded by patient arm position.  Midline trachea.  Mild cardiomegaly. Mediastinal contours otherwise within normal limits.  No pleural effusion or pneumothorax. Interstitial edema is improved.  Mild pulmonary venous congestion remains. No lobar consolidation.  IMPRESSION: Cardiomegaly with improved / resolved interstitial edema.  Mild pulmonary venous congestion remains.  Original Report Authenticated By: Consuello Bossier, M.D.    Cardiac Studies: Telemetry shows NSR.  Assessment/Plan:  Patient Active Hospital Problem List: Dyspnea (03/29/2012)   Assessment: Improving   Plan: Reduce lasix  Congestive heart failure (03/29/2012)   Assessment: Acute systolic heart failure with EF 20-25%   Plan: Treadmill myoview today.  Anemia (03/29/2012)   Assessment: Iron deficiency probably secondary to heavy menstrual flow.    Plan: Begin iron  Possible home in am     LOS: 3 days    Cassell Clement 04/01/2012, 7:24 AM

## 2012-04-02 ENCOUNTER — Encounter (HOSPITAL_COMMUNITY): Payer: Self-pay | Admitting: Physician Assistant

## 2012-04-02 ENCOUNTER — Encounter (HOSPITAL_COMMUNITY)
Admission: EM | Disposition: A | Discharge status: Home / Self Care | Payer: Self-pay | Source: Home / Self Care | Attending: Cardiology

## 2012-04-02 DIAGNOSIS — I509 Heart failure, unspecified: Secondary | ICD-10-CM

## 2012-04-02 HISTORY — PX: LEFT HEART CATHETERIZATION WITH CORONARY ANGIOGRAM: SHX5451

## 2012-04-02 LAB — BASIC METABOLIC PANEL
BUN: 19 mg/dL (ref 6–23)
Calcium: 9.9 mg/dL (ref 8.4–10.5)
Creatinine, Ser: 0.85 mg/dL (ref 0.50–1.10)
GFR calc non Af Amer: 81 mL/min — ABNORMAL LOW (ref >90)
Glucose, Bld: 97 mg/dL (ref 70–99)
Sodium: 134 mEq/L — ABNORMAL LOW (ref 135–145)

## 2012-04-02 LAB — SURGICAL PCR SCREEN: MRSA, PCR: NEGATIVE

## 2012-04-02 SURGERY — LEFT HEART CATHETERIZATION WITH CORONARY ANGIOGRAM
Anesthesia: LOCAL

## 2012-04-02 MED ORDER — LIDOCAINE HCL (PF) 1 % IJ SOLN
INTRAMUSCULAR | Status: AC
  Filled 2012-04-02: qty 30

## 2012-04-02 MED ORDER — LISINOPRIL 5 MG PO TABS: 5.0000 mg | ORAL_TABLET | Freq: Two times a day (BID) | ORAL | Status: DC

## 2012-04-02 MED ORDER — HEPARIN (PORCINE) IN NACL 2-0.9 UNIT/ML-% IJ SOLN
INTRAMUSCULAR | Status: AC
  Filled 2012-04-02: qty 2000

## 2012-04-02 MED ORDER — FERROUS SULFATE 325 (65 FE) MG PO TABS: 325.0000 mg | ORAL_TABLET | Freq: Every day | ORAL | Status: DC

## 2012-04-02 MED ORDER — NITROGLYCERIN 0.2 MG/ML ON CALL CATH LAB
INTRAVENOUS | Status: AC
  Filled 2012-04-02: qty 1

## 2012-04-02 MED ORDER — POTASSIUM CHLORIDE CRYS ER 20 MEQ PO TBCR: 20.0000 meq | EXTENDED_RELEASE_TABLET | Freq: Every day | ORAL | Status: DC

## 2012-04-02 MED ORDER — ASPIRIN 81 MG PO TBEC: 81.0000 mg | DELAYED_RELEASE_TABLET | Freq: Every day | ORAL | Status: DC

## 2012-04-02 MED ORDER — MIDAZOLAM HCL 2 MG/2ML IJ SOLN
INTRAMUSCULAR | Status: AC
  Filled 2012-04-02: qty 2

## 2012-04-02 MED ORDER — FENTANYL CITRATE 0.05 MG/ML IJ SOLN
INTRAMUSCULAR | Status: AC
  Filled 2012-04-02: qty 2

## 2012-04-02 MED ORDER — SODIUM CHLORIDE 0.9 % IV SOLN
1.0000 mL/kg/h | INTRAVENOUS | Status: AC
  Administered 2012-04-02: 1 mL/kg/h via INTRAVENOUS

## 2012-04-02 MED ORDER — CARVEDILOL 3.125 MG PO TABS: 3.1250 mg | ORAL_TABLET | Freq: Two times a day (BID) | ORAL | Status: DC

## 2012-04-02 MED ORDER — FUROSEMIDE 40 MG PO TABS: 40.0000 mg | ORAL_TABLET | Freq: Every day | ORAL | Status: DC

## 2012-04-02 NOTE — Progress Notes (Signed)
Nursing Note: Pt to be discharged after bedrest complete post cardiac catheterization. Pt 's right groin within normal limits no hematoma, bleeding or swelling. Pt has received all discharge information including post cardiac catheterization instructions. Pt verbalizes understanding. Will continue to monitor pt and discharge appropriately. Jammy Stlouis Scientist, clinical (histocompatibility and immunogenetics).

## 2012-04-02 NOTE — Progress Notes (Signed)
Nursing Note: Pt ambulated from room (4715) to nursing station with no complaints. Pt's right groin checked before ambulation with no bleeding, swelling or hematoma and after ambulation with no hematoma, bleeding or swelling. Vital signs stable at 96/61, 72. Will discharge pt per MD's order. Violetta Lavalle Scientist, clinical (histocompatibility and immunogenetics).

## 2012-04-02 NOTE — CV Procedure (Signed)
   Cardiac Catheterization Procedure Note  Name: Sydney Shaffer MRN: [id number] DOB: 10-19-1966  Procedure: Left Heart Cath, Selective Coronary Angiography, LV angiography  Indication: 46 year old white female presents with new onset of congestive heart failure. She has an ejection fraction of 25%. Myoview study suggest anteroseptal and apical ischemia.   Procedural details: The right groin was prepped, draped, and anesthetized with 1% lidocaine. Using modified Seldinger technique, a 5 French sheath was introduced into the right femoral artery. Standard Judkins catheters were used for coronary angiography and left ventriculography. Catheter exchanges were performed over a guidewire. There were no immediate procedural complications. The patient was transferred to the post catheterization recovery area for further monitoring.  Procedural Findings: Hemodynamics:  AO 98/65 with a mean of 79 mmHg LV 94/20 mmHg   Coronary angiography: Coronary dominance: right  Left mainstem: Normal  Left anterior descending (LAD): Normal  Left circumflex (LCx): Normal  Right coronary artery (RCA): Normal  Left ventriculography: [name]  Final Conclusions:   1. Normal coronary anatomy.  Recommendations: Optimize medical therapy for congestive heart failure.  Eutha Cude [place] 04/02/2012, 8:14 AM

## 2012-04-02 NOTE — Interval H&P Note (Signed)
History and Physical Interval Note:  04/02/2012 7:31 AM  Sydney Shaffer  has presented today for surgery, with the diagnosis of Abnormal cardiac stress test and CHF.  The various methods of treatment have been discussed with the patient and family. After consideration of risks, benefits and other options for treatment, the patient has consented to  Procedure(s) (LRB): LEFT HEART CATHETERIZATION WITH CORONARY ANGIOGRAM ([name]) as a surgical intervention .  The patients' history has been reviewed, patient examined, no change in status, stable for surgery.  I have reviewed the patients' chart and labs.  Questions were answered to the patient's satisfaction.     Theron Arista [facility] 04/02/2012 7:31 AM

## 2012-04-02 NOTE — Progress Notes (Signed)
Nursing Note: Pt in cath lab upon start of 0700 am shift. Will assess pt upon arrival to unit post cath. Mayling Aber Scientist, clinical (histocompatibility and immunogenetics).

## 2012-04-02 NOTE — H&P (View-Only) (Signed)
 Subjective:  No further chest pain. Enzymes negative. Chest xray yesterday improved. Weight down 1 more lb.  BP borderline low. Will reduce lasix  Objective:  Vital Signs in the last 24 hours: Temp:  [98.1 F (36.7 C)-98.2 F (36.8 C)] 98.2 F (36.8 C) (04/25 0553) Pulse Rate:  [82-90] 88  (04/25 0553) Resp:  [20] 20  (04/25 0553) BP: (95-104)/(58-68) 95/58 mmHg (04/25 0553) SpO2:  [100 %] 100 % (04/25 0553) Weight:  [86.7 kg (191 lb 2.2 oz)] 86.7 kg (191 lb 2.2 oz) (04/25 0553)  Intake/Output from previous day: 04/24 0701 - 04/25 0700 In: 723 [P.O.:720; I.V.:3] Out: 600 [Urine:600] Intake/Output from this shift:       . aspirin EC  81 mg Oral Daily  . carvedilol  3.125 mg Oral Q12H  . ferrous sulfate  325 mg Oral Q breakfast  . furosemide  40 mg Oral BID  . guaiFENesin  600 mg Oral BID  . heparin  5,000 Units Subcutaneous Q8H  . lisinopril  5 mg Oral BID  . mulitivitamin with minerals  1 tablet Oral Daily  . potassium chloride  20 mEq Oral TID  . sodium chloride  3 mL Intravenous Q12H  . DISCONTD: furosemide  40 mg Intravenous Q12H      Physical Exam: The patient appears to be in no distress.  Head and neck exam reveals that the pupils are equal and reactive.  The extraocular movements are full.  There is no scleral icterus.  Mouth and pharynx are benign.  No lymphadenopathy.  No carotid bruits.  The jugular venous pressure is normal.  Thyroid is not enlarged or tender.  Chest reveals bilateral rales unchanged from admission.  Heart reveals no abnormal lift or heave.  First and second heart sounds are normal.  There is no murmur gallop rub or click. The S3 gallop heard on admission is no longer present.  The abdomen is soft. The hepatic tenderness is less.  Bowel sounds are normoactive.  There is no hepatosplenomegaly or mass.  There are no abdominal bruits.  Extremities reveal no phlebitis .  trace edema bilaterally. Pedal pulses are good.  There is no  cyanosis or clubbing.  Neurologic exam is normal strength and no lateralizing weakness.  No sensory deficits.  Integument reveals no rash  Lab Results:  Basename 03/30/12 0857 03/29/12 2200  WBC 7.4 10.8*  HGB 10.6* 10.8*  PLT 344 353    Basename 04/01/12 0351 03/31/12 0540  NA 137 138  K 4.3 4.1  CL 100 102  CO2 29 26  GLUCOSE 98 95  BUN 18 16  CREATININE 0.94 0.78    Basename 04/01/12 0347 03/31/12 2108  TROPONINI <0.30 <0.30   Hepatic Function Panel  Basename 03/29/12 2200  PROT 6.9  ALBUMIN 3.3*  AST 30  ALT 32  ALKPHOS 75  BILITOT 0.9  BILIDIR --  IBILI --    Basename 03/30/12 0700  CHOL 124   No results found for this basename: PROTIME in the last 72 hours  Imaging: Dg Chest 2 View  03/31/2012  *RADIOLOGY REPORT*  Clinical Data: CHF.  Shortness of breath and chest pain.  CHEST - 2 VIEW  Comparison: 03/29/2012  Findings: Lateral view degraded by patient arm position.  Midline trachea.  Mild cardiomegaly. Mediastinal contours otherwise within normal limits.  No pleural effusion or pneumothorax. Interstitial edema is improved.  Mild pulmonary venous congestion remains. No lobar consolidation.  IMPRESSION: Cardiomegaly with improved / resolved interstitial edema.    Mild pulmonary venous congestion remains.  Original Report Authenticated By: KYLE D. TALBOT, M.D.    Cardiac Studies: Telemetry shows NSR.  Assessment/Plan:  Patient Active Hospital Problem List: Dyspnea (03/29/2012)   Assessment: Improving   Plan: Reduce lasix  Congestive heart failure (03/29/2012)   Assessment: Acute systolic heart failure with EF 20-25%   Plan: Treadmill myoview today.  Anemia (03/29/2012)   Assessment: Iron deficiency probably secondary to heavy menstrual flow.    Plan: Begin iron  Possible home in am     LOS: 3 days    Kamani Lewter 04/01/2012, 7:24 AM    

## 2012-04-02 NOTE — Discharge Summary (Signed)
Patient seen and examined and history reviewed. Agree with above findings and plan. See cath note findings. CHF is well compensated. Will follow up as outpatient to optimize Rx.  Thedora Hinders 04/02/2012 10:44 AM

## 2012-04-02 NOTE — Progress Notes (Signed)
Nursing Note: Pt back to room post cath. Pt has no complaints of pain at this time. No signs of distress. Vitals signs 95/72 pulse 77. Pt lying in bed with right leg straight. Pt educated on post instructions and verbalizes understanding. Right groin no hematoma, bleeding, or swelling at site. Will continue to monitor and educate pt appropriately. Najeh Credit Scientist, clinical (histocompatibility and immunogenetics).

## 2012-04-02 NOTE — Discharge Summary (Signed)
Discharge Summary   Patient ID: Sydney Shaffer MRN: [id number], DOB/AGE: [birth year] 46 y.o. Admit date: 03/29/2012 D/C date:     04/02/2012   Primary Discharge Diagnoses:  1. New onset acute systolic CHF secondary to NICM/DCM - EF 20-25% by echo 03/30/12 (report not populating in epic - see below) - abnormal nuclear stress test 04/01/12 but normal coronary arteries by cath 04/02/12 2. Anemia felt secondary to heavy menstrual flow with hx of prior anemia  Hospital Course: 46 y/o F with no prior cardiac hx was admitted from the ER with complaints of SOB and intermittent CP for 2 weeks. She reported orthopnea, PND, bilateral peripheral edema. Se has been taking Aleve and ibupfoen for CP. She denied any hx of high blood pressure although her blood pressure on presentation was elevated. She denied any illicit drugs such as cocaine or marijuana. She did endorse occasional EtOH. CXR demonstrated cardiomegaly with vascular congestion, basilar interstitial opacity and small bilateral pleural effusions and it was felt she had likely CHF. Cardiac enzymes were repeatedly cycled and negative. prBNP was 1829. EKG showed sinus tach, ICRBBB, nonspecific ST-T changes. She had an S3 gallop on admission. She was slightly anemic and this was felt secondary to her heavy menstrual flow; iron was added. She had a cough with yellow sputum was not felt to have any pneumonia; she was afebrile. She was started on Lasix, ACEI. 2D echo was obtained with formal report not yet in chart, but Dr. Patty Sermons notes that it showed dilated CM with EF 20-25 %. She underwent Myoview stress testing to exclude CAD which was abnormal demonstrating reversible perfusion in the mid anteroseptal wall, apical thinning versus old, small apical infrct, and dilated LV and hypokinesis with EF 25%. On that same day her BP was borderline low requiring down-titration of Lasix. Because of her myoview, she underwent heart cath today 04/02/12 demonstrating normal  coronary anatomy. Dr. [place] recommended to optimize med rx, and thus recommended she continue current meds per [facility] including baby ASA, lisinopril, Coreg, and Lasix. She will be allowed to return to work 1 week per that discussion. The patient was seen and examined today and felt stable for discharge by Dr. [place] pending her post-cath observation period. She was instructed to follow up closely within the next few days with PCP regarding her anemia.  Please note - if her EF should continue to be persistently low she should be considered for ICD placement.  Discharge Vitals: Blood pressure 95/72, pulse 94, temperature 98.2 F (36.8 C), temperature source Oral, resp. rate 16, height 5\' 8"  (1.727 m), weight 190 lb 14.7 oz (86.6 kg), last menstrual period 03/22/2012, SpO2 97.00%.  Labs: Lab Results  Component Value Date   WBC 7.4 03/30/2012   HGB 10.6* 03/30/2012   HCT 32.2* 03/30/2012   MCV 78.0 03/30/2012   PLT 344 03/30/2012     Lab 04/02/12 0510 03/29/12 2200  NA 134* --  K 4.6 --  CL 100 --  CO2 27 --  BUN 19 --  CREATININE 0.85 --  CALCIUM 9.9 --  PROT -- 6.9  BILITOT -- 0.9  ALKPHOS -- 75  ALT -- 32  AST -- 30  GLUCOSE 97 --    Basename 04/01/12 0347 03/31/12 2108 03/31/12 1616 03/30/12 1443  CKTOTAL 59 73 74 76  CKMB 1.8 2.0 1.9 2.6  TROPONINI <0.30 <0.30 <0.30 <0.30   Lab Results  Component Value Date   CHOL 124 03/30/2012   HDL 50 03/30/2012  LDLCALC 59 03/30/2012   TRIG 74 03/30/2012   Lab Results  Component Value Date   DDIMER <0.22 03/29/2012    Diagnostic Studies/Procedures   1. Chest 2 View 03/31/2012  *RADIOLOGY REPORT*  Clinical Data: CHF.  Shortness of breath and chest pain.  CHEST - 2 VIEW  Comparison: 03/29/2012  Findings: Lateral view degraded by patient arm position.  Midline trachea.  Mild cardiomegaly. Mediastinal contours otherwise within normal limits.  No pleural effusion or pneumothorax. Interstitial edema is improved.  Mild pulmonary venous  congestion remains. No lobar consolidation.  IMPRESSION: Cardiomegaly with improved / resolved interstitial edema.  Mild pulmonary venous congestion remains.  Original Report Authenticated By: Consuello Bossier, M.D.   2. Chest 2 View 03/29/2012  *RADIOLOGY REPORT*  Clinical Data: Chest pain and shortness of breath.  Cough.  CHEST - 2 VIEW  Comparison: None.  Findings: Patchy airspace disease is seen at the lung bases with small bilateral pleural effusions. The cardiopericardial silhouette is enlarged. Imaged bony structures of the thorax are intact.  IMPRESSION: Cardiomegaly with vascular congestion, basilar interstitial opacity and small bilateral pleural effusions. Failure would be a consideration.  Original Report Authenticated By: ERIC A. MANSELL, M.D.   3. Nm Myocar Multi W/spect W/wall Motion / Ef 04/01/2012  *RADIOLOGY REPORT*  Clinical Data:  Shortness of breath and chest pain.  MYOCARDIAL IMAGING WITH SPECT (REST AND PHARMACOLOGIC-STRESS) GATED LEFT VENTRICULAR WALL MOTION STUDY LEFT VENTRICULAR EJECTION FRACTION  Technique:  Standard myocardial SPECT imaging was performed after resting intravenous injection of 10 mCi Tc-77m tetrofosmin. Subsequently, intravenous infusion of regadenoson was performed under the supervision of the Cardiology staff.  At peak effect of the drug, 30 mCi Tc-44m tetrofosmin was injected intravenously and standard myocardial SPECT  imaging was performed.  Quantitative gated imaging was also performed to evaluate left ventricular wall motion, and estimate left ventricular ejection fraction.  Comparison:  None.  Findings: In the mid anteroseptal wall, there is an area of apparent reversible perfusion.  The patient has some apical thinning versus an old apical infarct and mild breast attenuation. The left ventricle appears dilated. Wall motion is hypokinetic. End-diastolic volume is 184 ml and end-systolic volume of 138 ml per ejection fraction of 25%.  IMPRESSION:  1.  Reversible  perfusion in the mid anteroseptal wall. 2.  Apical thinning thinning versus old, small apical infarct. 3.  Dilated left ventricle and hypokinesis with decreased ejection fraction of 25%.  Original Report Authenticated By: Bernadene Bell. D'ALESSIO, M.D.   4. 2D echo 03/30/12 Study Conclusions (transcribed from paper copy in chart) LV: Cavity size was mildly dilated. Wall thickness was increased in a pattern of mild LVH. Systolic function was severely reduced. EF 20-25% with diffuse hypokinesis. MV: Mild MR. LA: Atrium was mildly dilated. RV: systolic function was mildly to moderately reduced. Pericardium: A small pericardial effusion was identified.  Discharge Medications   Medication List  As of 04/02/2012  9:45 AM   STOP taking these medications         ibuprofen 200 MG tablet         TAKE these medications         aspirin 81 MG EC tablet   Take 1 tablet (81 mg total) by mouth daily.      carvedilol 3.125 MG tablet   Commonly known as: COREG   Take 1 tablet (3.125 mg total) by mouth 2 (two) times daily with a meal.      ferrous sulfate 325 (65 FE) MG  tablet   Take 1 tablet (325 mg total) by mouth daily with breakfast.      furosemide 40 MG tablet   Commonly known as: LASIX   Take 1 tablet (40 mg total) by mouth daily.      lisinopril 5 MG tablet   Commonly known as: PRINIVIL,ZESTRIL   Take 1 tablet (5 mg total) by mouth 2 (two) times daily.      mulitivitamin with minerals Tabs   Take 1 tablet by mouth daily.      potassium chloride SA 20 MEQ tablet   Commonly known as: K-DUR,KLOR-CON   Take 1 tablet (20 mEq total) by mouth daily.            Disposition   The patient will be discharged in stable condition to home. Discharge Orders    Future Appointments: Provider: Department: Dept Phone: Center:   04/16/2012 9:00 AM Rosalio Macadamia, NP [facility] [phone] None     Future Orders Please Complete By Expires   Diet - low sodium heart healthy      Increase  activity slowly      Comments:   No driving for 2 days. No lifting over 5 lbs for 1 week. No sexual activity for 1 week. You may return to work on 04/09/12. Keep procedure site clean & dry. If you notice increased pain, swelling, bleeding or pus, call/return!  You may shower, but no soaking baths/hot tubs/pools for 1 week.     Discharge instructions      Comments:   You will need to follow with your primary care doctor closely for your anemia. If you notice any unusual bleeding including bloody stools, black stools, blood in your urine or anything else out of the ordinary, seek medical attention.     Follow-up Information    Follow up with Primary Care Doctor. (Please see your primary care doctor within the next few days to discuss further evaluation/monitoring of your anemia (your blood counts are low and this NEEDS to be followed!))       Follow up with Norma Fredrickson, NP. (Your first follow-up will be at [facility] on 04/16/12 at 8:30am)    Contact information:   [address], [address] [place] [address] [phone]            Duration of Discharge Encounter: Greater than 30 minutes including physician and PA time.  Signed, Ronie Spies PA-C 04/02/2012, 9:45 AM

## 2012-04-05 LAB — POCT ACTIVATED CLOTTING TIME: Activated Clotting Time: 100 seconds

## 2012-04-16 ENCOUNTER — Encounter: Payer: 59 | Admitting: Nurse Practitioner

## 2012-04-22 ENCOUNTER — Encounter: Payer: 59 | Admitting: Nurse Practitioner

## 2012-04-30 ENCOUNTER — Encounter: Payer: Self-pay | Admitting: *Deleted

## 2012-04-30 ENCOUNTER — Encounter: Payer: Self-pay | Admitting: Nurse Practitioner

## 2012-05-05 ENCOUNTER — Encounter: Payer: Self-pay | Admitting: Nurse Practitioner

## 2012-05-05 ENCOUNTER — Ambulatory Visit (INDEPENDENT_AMBULATORY_CARE_PROVIDER_SITE_OTHER): Payer: Self-pay | Admitting: Nurse Practitioner

## 2012-05-05 DIAGNOSIS — R0600 Dyspnea, unspecified: Secondary | ICD-10-CM

## 2012-05-05 DIAGNOSIS — I5022 Chronic systolic (congestive) heart failure: Secondary | ICD-10-CM | POA: Insufficient documentation

## 2012-05-05 DIAGNOSIS — I5023 Acute on chronic systolic (congestive) heart failure: Secondary | ICD-10-CM

## 2012-05-05 DIAGNOSIS — R0989 Other specified symptoms and signs involving the circulatory and respiratory systems: Secondary | ICD-10-CM

## 2012-05-05 DIAGNOSIS — R0609 Other forms of dyspnea: Secondary | ICD-10-CM

## 2012-05-05 DIAGNOSIS — I5020 Unspecified systolic (congestive) heart failure: Secondary | ICD-10-CM

## 2012-05-05 HISTORY — DX: Acute on chronic systolic (congestive) heart failure: I50.23

## 2012-05-05 LAB — CBC WITH DIFFERENTIAL/PLATELET
Basophils Absolute: 0.1 10*3/uL (ref 0.0–0.1)
Basophils Relative: 0.7 % (ref 0.0–3.0)
Eosinophils Absolute: 0.4 10*3/uL (ref 0.0–0.7)
Eosinophils Relative: 5.3 % — ABNORMAL HIGH (ref 0.0–5.0)
HCT: 36.9 % (ref 36.0–46.0)
Hemoglobin: 12.1 g/dL (ref 12.0–15.0)
Lymphocytes Relative: 33.6 % (ref 12.0–46.0)
Lymphs Abs: 2.4 10*3/uL (ref 0.7–4.0)
MCHC: 32.7 g/dL (ref 30.0–36.0)
MCV: 79.8 fl (ref 78.0–100.0)
Monocytes Absolute: 0.6 10*3/uL (ref 0.1–1.0)
Monocytes Relative: 8.1 % (ref 3.0–12.0)
Neutro Abs: 3.7 10*3/uL (ref 1.4–7.7)
Neutrophils Relative %: 52.3 % (ref 43.0–77.0)
Platelets: 241 10*3/uL (ref 150.0–400.0)
RBC: 4.63 Mil/uL (ref 3.87–5.11)
RDW: 18.6 % — ABNORMAL HIGH (ref 11.5–14.6)
WBC: 7.1 10*3/uL (ref 4.5–10.5)

## 2012-05-05 LAB — BRAIN NATRIURETIC PEPTIDE: Pro B Natriuretic peptide (BNP): 227 pg/mL — ABNORMAL HIGH (ref 0.0–100.0)

## 2012-05-05 LAB — BASIC METABOLIC PANEL
BUN: 20 mg/dL (ref 6–23)
CO2: 26 mEq/L (ref 19–32)
Calcium: 9.5 mg/dL (ref 8.4–10.5)
Chloride: 106 mEq/L (ref 96–112)
Creatinine, Ser: 0.9 mg/dL (ref 0.4–1.2)
GFR: 90.99 mL/min (ref >60.00)
Glucose, Bld: 93 mg/dL (ref 70–99)
Potassium: 4 mEq/L (ref 3.5–5.1)
Sodium: 138 mEq/L (ref 135–145)

## 2012-05-05 MED ORDER — CARVEDILOL 3.125 MG PO TABS: 6.2500 mg | ORAL_TABLET | Freq: Two times a day (BID) | ORAL | Status: DC

## 2012-05-05 MED ORDER — FUROSEMIDE 40 MG PO TABS: 20.0000 mg | ORAL_TABLET | Freq: Every day | ORAL | Status: DC

## 2012-05-05 NOTE — Progress Notes (Signed)
Sydney Shaffer Date of Birth: Jul 05, 1966 Medical Record #[id number]  History of Present Illness: Sydney Shaffer is seen back today for a post hospital visit. She is seen for Dr. [place]. She has had a recent admission for dyspnea and chest pain. Has been found to have a nonischemic CM. Her EF is 20 to 25%. Normal coronaries per cath after having an abnormal Myoview. She is anemic and is felt to be related to heavy menses.   She comes in today. She is here alone. She is doing well. Has returned to work. Feels better overall and is having less shortness of breath and just occasional swelling. Does have daily alcohol use and likes salt. Seems to be tolerating her medicines. She is walking some. Weights have been stable.   Current Outpatient Prescriptions on File Prior to Visit  Medication Sig Dispense Refill  . aspirin EC 81 MG EC tablet Take 1 tablet (81 mg total) by mouth daily.      . carvedilol (COREG) 3.125 MG tablet Take 1 tablet (3.125 mg total) by mouth 2 (two) times daily with a meal.  60 tablet  6  . ferrous sulfate 325 (65 FE) MG tablet Take 1 tablet (325 mg total) by mouth daily with breakfast.  30 tablet  2  . furosemide (LASIX) 40 MG tablet Take 1 tablet (40 mg total) by mouth daily.  30 tablet  6  . lisinopril (PRINIVIL,ZESTRIL) 5 MG tablet Take 1 tablet (5 mg total) by mouth 2 (two) times daily.  60 tablet  6  . Multiple Vitamin (MULITIVITAMIN WITH MINERALS) TABS Take 1 tablet by mouth daily.      . potassium chloride SA (K-DUR,KLOR-CON) 20 MEQ tablet Take 1 tablet (20 mEq total) by mouth daily.  30 tablet  6    No Known Allergies  Past Medical History  Diagnosis Date  . CHF (congestive heart failure)     Dx 03/2012 - dilated cardiomyopathy with EF 20-25% by echo (abnl nuc but normal coronaries 04/02/12 per cath.   . Anemia     felt to be due to heavy menstrual flow    Past Surgical History  Procedure Date  . Tubal ligation   . Appendectomy   . Cardiac catheterization April  2013    normal coronaries    History  Smoking status  . Never Smoker   Smokeless tobacco  . Not on file    History  Alcohol Use  . 1.2 oz/week  . 2 Glasses of wine per week    Family History  Problem Relation Age of Onset  . Breast cancer Mother   . Cancer Father     Review of Systems: The review of systems is per the HPI.  All other systems were reviewed and are negative.  Physical Exam: BP 98/78  Pulse 80  Ht 5\' 8"  (1.727 m)  Wt 192 lb (87.091 kg)  BMI 29.19 kg/m2 Patient is very pleasant and in no acute distress. Skin is warm and dry. Color is normal.  HEENT is unremarkable. Normocephalic/atraumatic. PERRL. Sclera are nonicteric. Neck is supple. No masses. No JVD. Lungs are clear. Cardiac exam shows a regular rate and rhythm. No S3 noted. Abdomen is soft. Extremities are without edema. Gait and ROM are intact. No gross neurologic deficits noted.   LABORATORY DATA:  Lab Results  Component Value Date   WBC 7.4 03/30/2012   HGB 10.6* 03/30/2012   HCT 32.2* 03/30/2012   PLT 344 03/30/2012   GLUCOSE 97  04/02/2012   CHOL 124 03/30/2012   TRIG 74 03/30/2012   HDL 50 03/30/2012   LDLDIRECT 55 09/28/2008   LDLCALC 59 03/30/2012   ALT 32 03/29/2012   AST 30 03/29/2012   NA 134* 04/02/2012   K 4.6 04/02/2012   CL 100 04/02/2012   CREATININE 0.85 04/02/2012   BUN 19 04/02/2012   CO2 27 04/02/2012   TSH 3.864 03/29/2012   INR 1.05 03/29/2012   HGBA1C 5.8* 03/30/2012   2D echo 03/30/12 Study Conclusions   LV: Cavity size was mildly dilated. Wall thickness was increased in a pattern of mild LVH.  Systolic function was severely reduced. EF 20-25% with diffuse hypokinesis.  MV: Mild MR.  LA: Atrium was mildly dilated.  RV: systolic function was mildly to moderately reduced.  Pericardium: A small pericardial effusion was identified.   Assessment / Plan:

## 2012-05-05 NOTE — Patient Instructions (Addendum)
We will check labs today  Continue with your current medicines except cut the Lasix in half and increase the Coreg to 6.25 mg two times a day (Use two of the 3.125 mg tabs to equal this dose)  Weigh yourself each morning and record.  No alcohol  Take extra dose of diuretic (Lasix) for weight gain of 3 pounds in 24 hours.   Limit sodium intake. Goal is to have less than 2000 mg (2gm) of salt per day.  Check your blood pressure occasionally and record.   We will see you in about 2 1/2 weeks.  Call the [facility] office at [phone] if you have any questions, problems or concerns.      2 Gram Low Sodium Diet A 2 gram sodium diet restricts the amount of sodium in the diet to no more than 2 g or 2000 mg daily. Limiting the amount of sodium is often used to help lower blood pressure. It is important if you have heart, liver, or kidney problems. Many foods contain sodium for flavor and sometimes as a preservative. When the amount of sodium in a diet needs to be low, it is important to know what to look for when choosing foods and drinks. The following includes some information and guidelines to help make it easier for you to adapt to a low sodium diet. QUICK TIPS  Do not add salt to food.   Avoid convenience items and fast food.   Choose unsalted snack foods.   Buy lower sodium products, often labeled as "lower sodium" or "no salt added."   Check food labels to learn how much sodium is in 1 serving.   When eating at a restaurant, ask that your food be prepared with less salt or none, if possible.  READING FOOD LABELS FOR SODIUM INFORMATION The nutrition facts label is a good place to find how much sodium is in foods. Look for products with no more than 500 to 600 mg of sodium per meal and no more than 150 mg per serving. Remember that 2 g = 2000 mg. The food label may also list foods as:  Sodium-free: Less than 5 mg in a serving.   Very low sodium: 35 mg or less in a  serving.   Low-sodium: 140 mg or less in a serving.   Light in sodium: 50% less sodium in a serving. For example, if a food that usually has 300 mg of sodium is changed to become light in sodium, it will have 150 mg of sodium.   Reduced sodium: 25% less sodium in a serving. For example, if a food that usually has 400 mg of sodium is changed to reduced sodium, it will have 300 mg of sodium.  CHOOSING FOODS Grains  Avoid: Salted crackers and snack items. Some cereals, including instant hot cereals. Bread stuffing and biscuit mixes. Seasoned rice or pasta mixes.   Choose: Unsalted snack items. Low-sodium cereals, oats, puffed wheat and rice, shredded wheat. English muffins and bread. Pasta.  Meats  Avoid: Salted, canned, smoked, spiced, pickled meats, including fish and poultry. Bacon, ham, sausage, cold cuts, hot dogs, anchovies.   Choose: Low-sodium canned tuna and salmon. Fresh or frozen meat, poultry, and fish.  Dairy  Avoid: Processed cheese and spreads. Cottage cheese. Buttermilk and condensed milk. Regular cheese.   Choose: Milk. Low-sodium cottage cheese. Yogurt. Sour cream. Low-sodium cheese.  Fruits and Vegetables  Avoid: Regular canned vegetables. Regular canned tomato sauce and paste. Frozen vegetables  in sauces. Olives. Rosita Fire. Relishes. Sauerkraut.   Choose: Low-sodium canned vegetables. Low-sodium tomato sauce and paste. Frozen or fresh vegetables. Fresh and frozen fruit.  Condiments  Avoid: Canned and packaged gravies. Worcestershire sauce. Tartar sauce. Barbecue sauce. Soy sauce. Steak sauce. Ketchup. Onion, garlic, and table salt. Meat flavorings and tenderizers.   Choose: Fresh and dried herbs and spices. Low-sodium varieties of mustard and ketchup. Lemon juice. Tabasco sauce. Horseradish.  SAMPLE 2 GRAM SODIUM MEAL PLAN Breakfast / Sodium (mg)  1 cup low-fat milk / 143 mg   2 slices whole-wheat toast / 270 mg   1 tbs heart-healthy margarine / 153 mg   1  hard-boiled egg / 139 mg   1 small orange / 0 mg  Lunch / Sodium (mg)  1 cup raw carrots / 76 mg    cup hummus / 298 mg   1 cup low-fat milk / 143 mg    cup red grapes / 2 mg   1 whole-wheat pita bread / 356 mg  Dinner / Sodium (mg)  1 cup whole-wheat pasta / 2 mg   1 cup low-sodium tomato sauce / 73 mg   3 oz lean ground beef / 57 mg   1 small side salad (1 cup raw spinach leaves,  cup cucumber,  cup yellow bell pepper) with 1 tsp olive oil and 1 tsp red wine vinegar / 25 mg  Snack / Sodium (mg)  1 container low-fat vanilla yogurt / 107 mg   3 graham cracker squares / 127 mg  Nutrient Analysis  Calories: 2033   Protein: 77 g   Carbohydrate: 282 g   Fat: 72 g   Sodium: 1971 mg  Document Released: 11/24/2005 Document Revised: 11/13/2011 Document Reviewed: 02/25/2010 [facility] Patient Information 2012 [place], [place].

## 2012-05-05 NOTE — Assessment & Plan Note (Addendum)
Patient has chronic systolic heart failure with an EF of 20 to 25%. She is on low dose ACE/beta blocker and diuretic. We have discussed her diagnosis in detail today and the rationale for her treatment plan. Plan is to try and optimize her medicines with repeat echo. If we do not see improvement, will refer for ICD and ? BiVi pacer.  I have cut the Lasix in half to just 20 mg. I have increased her Coreg to 6.25 mg BID. We do not have a lot of blood pressure to work with but hopefully we will be able to push her doses a litle. She will check some blood pressures at home for [place]. We have discussed the need for salt restriction and stopping alcohol. She will continue with daily weights. We will check follow up labs today. Will see her back in about 2 1/2 weeks. Patient is agreeable to this plan and will call if any problems develop in the interim.

## 2012-05-25 ENCOUNTER — Ambulatory Visit (INDEPENDENT_AMBULATORY_CARE_PROVIDER_SITE_OTHER): Payer: Self-pay | Admitting: Nurse Practitioner

## 2012-05-25 ENCOUNTER — Encounter: Payer: Self-pay | Admitting: Nurse Practitioner

## 2012-05-25 VITALS — BP 122/76 | HR 70 | Ht 67.0 in | Wt 194.4 lb

## 2012-05-25 DIAGNOSIS — I519 Heart disease, unspecified: Secondary | ICD-10-CM

## 2012-05-25 LAB — BASIC METABOLIC PANEL
BUN: 11 mg/dL (ref 6–23)
CO2: 26 mEq/L (ref 19–32)
Calcium: 9.3 mg/dL (ref 8.4–10.5)
Chloride: 106 mEq/L (ref 96–112)
Creatinine, Ser: 0.8 mg/dL (ref 0.4–1.2)
GFR: 103.35 mL/min (ref >60.00)
Glucose, Bld: 90 mg/dL (ref 70–99)
Potassium: 3.5 mEq/L (ref 3.5–5.1)
Sodium: 140 mEq/L (ref 135–145)

## 2012-05-25 MED ORDER — CARVEDILOL 25 MG PO TABS: 12.5000 mg | ORAL_TABLET | Freq: Two times a day (BID) | ORAL | Status: DC

## 2012-05-25 NOTE — Assessment & Plan Note (Addendum)
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 We are trying to optimize her medicines with plans for repeat echo and possible BiV/ICD in the future if needed. We are increasing her Coreg to 12. 5 mg BID. I will see her in 3 weeks. She is on a total of 10 mg of ACE already. Will recheck a BMET today as well. Salt restriction is encouraged. She is not using alcohol. Patient is agreeable to this plan and will call if any problems develop in the interim.

## 2012-05-25 NOTE — Progress Notes (Signed)
Sydney Shaffer Date of Birth: Apr 17, 1966 Medical Record #[id number]  History of Present Illness: Sydney Shaffer is seen back today for a 3 week check. Sydney Shaffer is seen for Dr. Sw[place]il[place]. Sydney Shaffer has a nonischemic CM. EF is 20 to 25%. Normal coronaries per cath. No etiology identified but Sydney Shaffer reports that Sydney Shaffer did have a very bad cold back in the fall which was slow to resolve. Maybe this is viral. Sydney Shaffer has also had anemia. We increased her Coreg at the last visit and cut her Lasix in half. Trying to optimize her medicines with plans for repeat echo and then possible ICD/BiV. Sydney Shaffer had daily alcohol use and was advised to stop along with salt restriction.  Sydney Shaffer comes in today. Sydney Shaffer is here alone. Sydney Shaffer is doing ok for the most part. Not short of breath. Weight is ok. Some occasional chest pain. Feels like Sydney Shaffer might have gotten more salt over this past weekend since her sister was cooking. Not dizzy or lightheaded. Feels ok on her medicines. Sydney Shaffer is working with no issues. Not drinking any alcohol.   Current Outpatient Prescriptions on File Prior to Visit  Medication Sig Dispense Refill  . aspirin EC 81 MG EC tablet Take 1 tablet (81 mg total) by mouth daily.      . carvedilol (COREG) 3.125 MG tablet Take 2 tablets (6.25 mg total) by mouth 2 (two) times daily with a meal.  60 tablet  6  . ferrous sulfate 325 (65 FE) MG tablet Take 1 tablet (325 mg total) by mouth daily with breakfast.  30 tablet  2  . furosemide (LASIX) 40 MG tablet Take 0.5 tablets (20 mg total) by mouth daily.  30 tablet  6  . lisinopril (PRINIVIL,ZESTRIL) 5 MG tablet Take 1 tablet (5 mg total) by mouth 2 (two) times daily.  60 tablet  6  . Multiple Vitamin (MULITIVITAMIN WITH MINERALS) TABS Take 1 tablet by mouth daily.      . potassium chloride SA (K-DUR,KLOR-CON) 20 MEQ tablet Take 1 tablet (20 mEq total) by mouth daily.  30 tablet  6    No Known Allergies  Past Medical History  Diagnosis Date  . CHF (congestive heart failure)     Dx 03/2012 -  dilated cardiomyopathy with EF 20-25% by echo (abnl nuc but normal coronaries 04/02/12 per cath.   . Anemia     felt to be due to heavy menstrual flow    Past Surgical History  Procedure Date  . Tubal ligation   . Appendectomy   . Cardiac catheterization April 2013    normal coronaries    History  Smoking status  . Never Smoker   Smokeless tobacco  . Not on file    History  Alcohol Use  . 1.2 oz/week  . 2 Glasses of wine per week    daily    Family History  Problem Relation Age of Onset  . Breast cancer Mother   . Cancer Father     Review of Systems: The review of systems is per the HPI.  All other systems were reviewed and are negative.  Physical Exam: Ht 5\' 7"  (1.702 m)  Wt 194 lb 6.4 oz (88.179 kg)  BMI 30.45 kg/m2 Weight is up just 2 pounds.  Patient is very pleasant and in no acute distress. Sydney Shaffer is obese. Skin is warm and dry. Color is normal.  HEENT is unremarkable. Normocephalic/atraumatic. PERRL. Sclera are nonicteric. Neck is supple. No masses. No JVD. Lungs are clear. Cardiac  exam shows a regular rate and rhythm. No S3 noted. Abdomen is obese but soft. Extremities are without edema. Gait and ROM are intact. No gross neurologic deficits noted.  LABORATORY DATA: BMET is pending.    Assessment / Plan:

## 2012-05-25 NOTE — Patient Instructions (Signed)
We are going to increase the Coreg to 12.5 mg two times a day. I have sent the prescription for the 25 mg tablet size. Break these in half and use half two times a day  We are going to recheck lab today to look at your potassium.   Weigh yourself each morning and record.  Take extra dose of diuretic for weight gain of 3 pounds in 24 hours.   Limit sodium intake. Goal is to have less than 2000 mg (2gm) of salt per day.   I will see you in about 3 weeks.   Call the [facility] office at [phone] if you have any questions, problems or concerns.

## 2012-06-15 ENCOUNTER — Ambulatory Visit (INDEPENDENT_AMBULATORY_CARE_PROVIDER_SITE_OTHER): Payer: Self-pay | Admitting: Nurse Practitioner

## 2012-06-15 ENCOUNTER — Telehealth: Payer: Self-pay | Admitting: Nurse Practitioner

## 2012-06-15 ENCOUNTER — Encounter: Payer: Self-pay | Admitting: Nurse Practitioner

## 2012-06-15 VITALS — BP 122/80 | HR 79 | Ht 67.0 in | Wt 193.6 lb

## 2012-06-15 DIAGNOSIS — R0600 Dyspnea, unspecified: Secondary | ICD-10-CM

## 2012-06-15 DIAGNOSIS — I5020 Unspecified systolic (congestive) heart failure: Secondary | ICD-10-CM

## 2012-06-15 DIAGNOSIS — R0609 Other forms of dyspnea: Secondary | ICD-10-CM

## 2012-06-15 DIAGNOSIS — R0989 Other specified symptoms and signs involving the circulatory and respiratory systems: Secondary | ICD-10-CM

## 2012-06-15 LAB — BASIC METABOLIC PANEL
BUN: 19 mg/dL (ref 6–23)
CO2: 24 mEq/L (ref 19–32)
Calcium: 9.2 mg/dL (ref 8.4–10.5)
Chloride: 107 mEq/L (ref 96–112)
Creatinine, Ser: 0.8 mg/dL (ref 0.4–1.2)
GFR: 96.09 mL/min (ref >60.00)
Glucose, Bld: 92 mg/dL (ref 70–99)
Potassium: 3.7 mEq/L (ref 3.5–5.1)
Sodium: 138 mEq/L (ref 135–145)

## 2012-06-15 MED ORDER — LISINOPRIL 10 MG PO TABS: 10.0000 mg | ORAL_TABLET | Freq: Two times a day (BID) | ORAL | Status: DC

## 2012-06-15 NOTE — Progress Notes (Signed)
Darien Ramus Date of Birth: 04-25-1966 Medical Record #[id number]  History of Present Illness: Ms. Sundberg is seen back today for a 3 week check. She is seen for Dr. [place]. She has a nonischemic CM with an EF of 20 to 25%. Normal coronaries per cath back in April. No etiology identified but she had had a bad cold last fall which was slow to resolve. She has had prior daily alcohol use. We have increased her Coreg at her last visit. Had cut her Lasix back but she never followed thru and has continued taking a whole tablet. We are trying to optimize her medicines with plans for repeat echo and possible ICD/BiV.   She comes in today. She is here alone. She continues to do very well. Energy level is good. Not short of breath. Tolerating her medicines. She is working and seems to be ok overall. She does have a little cough over the past few days but thinks that is due to some heavy dusting that she did over the weekend.   Current Outpatient Prescriptions on File Prior to Visit  Medication Sig Dispense Refill  . aspirin EC 81 MG EC tablet Take 1 tablet (81 mg total) by mouth daily.      . carvedilol (COREG) 25 MG tablet Take 0.5 tablets (12.5 mg total) by mouth 2 (two) times daily.  60 tablet  3  . ferrous sulfate 325 (65 FE) MG tablet Take 1 tablet (325 mg total) by mouth daily with breakfast.  30 tablet  2  . Multiple Vitamin (MULITIVITAMIN WITH MINERALS) TABS Take 1 tablet by mouth daily.      . potassium chloride SA (K-DUR,KLOR-CON) 20 MEQ tablet Take 1 tablet (20 mEq total) by mouth daily.  30 tablet  6  . DISCONTD: furosemide (LASIX) 40 MG tablet Take 0.5 tablets (20 mg total) by mouth daily.  30 tablet  6    No Known Allergies  Past Medical History  Diagnosis Date  . CHF (congestive heart failure)     Dx 03/2012 - dilated cardiomyopathy with EF 20-25% by echo (abnl nuc but normal coronaries 04/02/12 per cath.   . Anemia     felt to be due to heavy menstrual flow    Past Surgical  History  Procedure Date  . Tubal ligation   . Appendectomy   . Cardiac catheterization April 2013    normal coronaries    History  Smoking status  . Never Smoker   Smokeless tobacco  . Not on file    History  Alcohol Use  . 1.2 oz/week  . 2 Glasses of wine per week    daily    Family History  Problem Relation Age of Onset  . Breast cancer Mother   . Cancer Father     Review of Systems: The review of systems is per the HPI.  All other systems were reviewed and are negative.  Physical Exam: BP 122/80  Pulse 79  Ht 5\' 7"  (1.702 m)  Wt 193 lb 9.6 oz (87.816 kg)  BMI 30.32 kg/m2 Patient is very pleasant and in no acute distress. Skin is warm and dry. Color is normal.  HEENT is unremarkable. Normocephalic/atraumatic. PERRL. Sclera are nonicteric. Neck is supple. No masses. No JVD. Lungs are clear. Cardiac exam shows a regular rate and rhythm. No S3 noted. Abdomen is soft. Extremities are without edema. Gait and ROM are intact. No gross neurologic deficits noted.  LABORATORY DATA: BMET is pending.   Assessment /  Plan:

## 2012-06-15 NOTE — Patient Instructions (Addendum)
We are going to increase the Lisinopril to 10 mg two times a day. Use 2 of your 5 mg tabs two times a day to use them up. The prescription for the 10 mg tab is at the drug store.  We are going to set up an ultrasound of your heart for 3 weeks  See Dr. [place] in 4 weeks to discuss  Monitor your cough and let us know if it gets worse  Stay away from the salt.  Call the [facility] office at [phone] if you have any questions, problems or concerns.

## 2012-06-15 NOTE — Assessment & Plan Note (Signed)
She looks well compensated. We still have room to continue to up titrate her medicines. Will increase the Lisinopril today to 10 mg BID. Will plan on repeating her echo towards the end of the month. Hopefully we will see improvement. If not, will need to consider referral to EP for an ICD evaluation. Will check a BMET today. Will have her see Dr. [place] in about a month. Patient is agreeable to this plan and will call if any problems develop in the interim.

## 2012-06-15 NOTE — Assessment & Plan Note (Signed)
No complaints verbalized.

## 2012-06-15 NOTE — Telephone Encounter (Signed)
New problem:  Patient calling back to speak with Marchelle Folks.

## 2012-06-21 NOTE — Telephone Encounter (Signed)
Fu call Pt calling back to talk to you

## 2012-06-21 NOTE — Telephone Encounter (Signed)
Patient called no answer.LMTC. 

## 2012-06-22 NOTE — Telephone Encounter (Signed)
Patient called no answer.LMTC. 

## 2012-06-22 NOTE — Telephone Encounter (Signed)
Patient called was told echo scheduled 07/05/12 at 10:30 am.Appointment scheduled with Dr.Jordan 07/29/12 at 4:15 pm.

## 2012-07-05 ENCOUNTER — Ambulatory Visit (HOSPITAL_COMMUNITY): Payer: Self-pay | Attending: Cardiology

## 2012-07-05 DIAGNOSIS — I079 Rheumatic tricuspid valve disease, unspecified: Secondary | ICD-10-CM | POA: Insufficient documentation

## 2012-07-05 DIAGNOSIS — I5020 Unspecified systolic (congestive) heart failure: Secondary | ICD-10-CM

## 2012-07-05 DIAGNOSIS — I428 Other cardiomyopathies: Secondary | ICD-10-CM | POA: Insufficient documentation

## 2012-07-05 DIAGNOSIS — I059 Rheumatic mitral valve disease, unspecified: Secondary | ICD-10-CM | POA: Insufficient documentation

## 2012-07-05 NOTE — Progress Notes (Signed)
Echocardiogram performed.  

## 2012-07-29 ENCOUNTER — Ambulatory Visit (INDEPENDENT_AMBULATORY_CARE_PROVIDER_SITE_OTHER): Payer: Self-pay | Admitting: Cardiology

## 2012-07-29 ENCOUNTER — Encounter: Payer: Self-pay | Admitting: Cardiology

## 2012-07-29 VITALS — BP 102/62 | HR 74 | Ht 67.0 in | Wt 195.0 lb

## 2012-07-29 DIAGNOSIS — I420 Dilated cardiomyopathy: Secondary | ICD-10-CM

## 2012-07-29 DIAGNOSIS — D649 Anemia, unspecified: Secondary | ICD-10-CM

## 2012-07-29 DIAGNOSIS — I428 Other cardiomyopathies: Secondary | ICD-10-CM

## 2012-07-29 DIAGNOSIS — I509 Heart failure, unspecified: Secondary | ICD-10-CM

## 2012-07-29 NOTE — Progress Notes (Signed)
Sydney Shaffer Date of Birth: [birth year] Medical Record #[id number]  History of Present Illness: Sydney Shaffer is seen back today for a followup visit. She has a nonischemic CM with an EF of 20 to 25%. Normal coronaries per cath back in April. No etiology identified but she had had a bad cold last fall which was slow to resolve. She has had prior daily alcohol use. Her medications including carvedilol and fosinopril have been optimized. She still has good days and bad days. Some day she is more short of breath than others. She denies any increased swelling and reports that her weight has been stable but by her scales her weight has increased by 2 pounds. Her energy level is still fairly good. 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 She has 3 children who are supportive. She still complains of some cough in the evenings.   Current Outpatient Prescriptions on File Prior to Visit  Medication Sig Dispense Refill  . aspirin EC 81 MG EC tablet Take 1 tablet (81 mg total) by mouth daily.      . carvedilol (COREG) 25 MG tablet Take 0.5 tablets (12.5 mg total) by mouth 2 (two) times daily.  60 tablet  3  . ferrous sulfate 325 (65 FE) MG tablet Take 1 tablet (325 mg total) by mouth daily with breakfast.  30 tablet  2  . furosemide (LASIX) 40 MG tablet Take 40 mg by mouth daily.      Marland Kitchen lisinopril (PRINIVIL,ZESTRIL) 10 MG tablet Take 1 tablet (10 mg total) by mouth 2 (two) times daily.  60 tablet  11  . Multiple Vitamin (MULITIVITAMIN WITH MINERALS) TABS Take 1 tablet by mouth daily.      . potassium chloride SA (K-DUR,KLOR-CON) 20 MEQ tablet Take 1 tablet (20 mEq total) by mouth daily.  30 tablet  6    No Known Allergies  Past Medical History  Diagnosis Date  . CHF (congestive heart failure)     Dx 03/2012 - dilated cardiomyopathy with EF 20-25% by echo (abnl nuc but normal coronaries 04/02/12 per cath.   . Anemia     felt to be due to heavy  menstrual flow    Past Surgical History  Procedure Date  . Tubal ligation   . Appendectomy   . Cardiac catheterization April 2013    normal coronaries    History  Smoking status  . Never Smoker   Smokeless tobacco  . Not on file    History  Alcohol Use  . 1.2 oz/week  . 2 Glasses of wine per week    daily    Family History  Problem Relation Age of Onset  . Breast cancer Mother   . Cancer Father     Review of Systems: The review of systems is per the HPI.  All other systems were reviewed and are negative.  Physical Exam: BP 102/62  Pulse 74  Ht 5\' 7"  (1.702 m)  Wt 195 lb (88.451 kg)  BMI 30.54 kg/m2  SpO2 97% Patient is very pleasant and in no acute distress. Skin is warm and dry. Color is normal.  HEENT is unremarkable. Normocephalic/atraumatic. PERRL. Sclera are nonicteric. Neck is supple. No masses. No JVD. Lungs are clear. Cardiac exam shows a regular rate and rhythm. No S3 noted. Abdomen is soft. Extremities are without edema. Gait and ROM are intact. No gross neurologic deficits noted.  LABORATORY DATA: Transthoracic Echocardiography  Patient:  Sydney Shaffer, Sydney Shaffer MR #: [id number] Study Date: 07/05/2012 Gender: F Age: 9 Height: 170.2cm Weight: 87.5kg BSA: 1.47m^2 Pt. Status: Room:  ORDERING Sw[place], Peter ATTENDING Crenshaw, Arlys John PERFORMING [facility], [facility] ORDERING Tyrone Sage, Lori REFERRING Norma Fredrickson SONOGRAPHER Philomena Course, RDCS cc:  ------------------------------------------------------------ LV EF: 20% - 25%  ------------------------------------------------------------ Indications: Other primary cardiomyopathy 425.4  ------------------------------------------------------------ History: PMH: Acquired from the patient and from the patient's chart. Non-ischemic cardiomyopathy. Congestive heart failure.  ------------------------------------------------------------ Study Conclusions  - Left ventricle: The cavity size was mildly  dilated. Wall thickness was normal. Systolic function was severely reduced. The estimated ejection fraction was in the range of 20% to 25%. Diffuse hypokinesis. - Mitral valve: Mild regurgitation. - Left atrium: The atrium was mildly dilated. - Pulmonary arteries: Systolic pressure was mildly increased. PA peak pressure: 32mm Hg (S).  ------------------------------------------------------------ Labs, prior tests, procedures, and surgery: Catheterization (April 2013).  Transthoracic echocardiography. M-mode, complete 2D, spectral Doppler, and color Doppler. Height: Height: 170.2cm. Height: 67in. Weight: Weight: 87.5kg. Weight: 192.6lb. Body mass index: BMI: 30.2kg/m^2. Body surface area: BSA: 1.53m^2. Blood pressure: 122/80. Patient status: Outpatient. Location: [place] [facility]  ------------------------------------------------------------  ------------------------------------------------------------ Left ventricle: The cavity size was mildly dilated. Wall thickness was normal. Systolic function was severely reduced. The estimated ejection fraction was in the range of 20% to 25%. Diffuse hypokinesis.  ------------------------------------------------------------ Aortic valve: Trileaflet; mildly thickened leaflets. Cusp separation was normal. Doppler: Transvalvular velocity was within the normal range. There was no stenosis. No regurgitation.  ------------------------------------------------------------ Aorta: Aortic root: The aortic root was normal in size.  ------------------------------------------------------------ Mitral valve: Structurally normal valve. Leaflet separation was normal. Doppler: Transvalvular velocity was within the normal range. There was no evidence for stenosis. Mild regurgitation. Peak gradient: 4mm Hg (D).  ------------------------------------------------------------ Left atrium: The atrium was mildly  dilated.  ------------------------------------------------------------ Right ventricle: The cavity size was normal. Systolic function was normal.  ------------------------------------------------------------ Pulmonic valve: Structurally normal valve. Cusp separation was normal. Doppler: Transvalvular velocity was within the normal range. No significant regurgitation.  ------------------------------------------------------------ Tricuspid valve: Structurally normal valve. Leaflet separation was normal. Doppler: Transvalvular velocity was within the normal range. Trivial regurgitation.  ------------------------------------------------------------ Pulmonary artery: Systolic pressure was mildly increased.  ------------------------------------------------------------ Right atrium: The atrium was normal in size.  ------------------------------------------------------------ Pericardium: There was no pericardial effusion.  ------------------------------------------------------------ Systemic veins: Inferior vena cava: The vessel was normal in size; the respirophasic diameter changes were in the normal range (= 50%); findings are consistent with normal central venous pressure.  ------------------------------------------------------------  2D measurements Normal Doppler measurements Norma Left ventricle l LVID ED, 54.8 mm 43-52 Main pulmonary artery chord, Pressure, 32 mm Hg =30 PLAX S LVID ES, 45.1 mm 23-38 Left ventricle chord, Ea, lat 8.7 cm/s ----- PLAX ann, tiss 7 FS, chord, 18 % >29 DP PLAX E/Ea, lat 11. ----- LVPW, ED 10.8 mm ------ ann, tiss 86 IVS/LVPW 0.92 <1.3 DP ratio, ED Ea, med 6.0 cm/s ----- Ventricular septum ann, tiss 3 IVS, ED 9.98 mm ------ DP LVOT E/Ea, med 17. ----- Diam, S 22 mm ------ ann, tiss 25 Area 3.8 cm^2 ------ DP Diam 22 mm ------ LVOT Aorta Peak vel, 72. cm/s ----- Root diam, 28 mm ------ S 2 ED VTI, S 14. cm ----- Left atrium 1 AP dim 44  mm ------ HR 65 bpm ----- AP dim 2.21 cm/m^2 <2.2 Stroke vol 53. ml ----- index 6 Cardiac 3.5 L/min ----- output Cardiac 1.8 L/(min-m ----- index ^2) Stroke 26. ml/m^2 ----- index 9 Mitral valve Peak E vel 104  cm/s ----- Peak A vel 49. cm/s ----- 9 Decelerati 120 ms 150-2 on time 30 Peak 4 mm Hg ----- gradient, D Peak E/A 2.1 ----- ratio Tricuspid valve Regurg 258 cm/s ----- peak vel Peak RV-RA 27 mm Hg ----- gradient, S Systemic veins Estimated 5 mm Hg ----- CVP Right ventricle Pressure, 32 mm Hg <30 S Sa vel, 9.7 cm/s ----- lat ann, 6 tiss DP  ------------------------------------------------------------ Prepared and Electronically Authenticated by  Olga Millers 2013-07-29T13:[id number]:24.270   Assessment / Plan: 1. Congestive heart failure, chronic systolic. This is secondary to a dilated nonischemic cardiomyopathy. Despite optimization of medications her ejection fraction remains very poor at 20-25%. She has class II symptoms. I recommended a cardiopulmonary stress test to further assess her functional limitation and to calculate her max V02. If poor she will need to be considered for transfer failure therapies such as possible transplant evaluation. I've also recommended referral for electrophysiology evaluation for an ICD implant. I don't think she would be a candidate for biventricular pacing since she does not have significant QRS widening.

## 2012-07-29 NOTE — Patient Instructions (Signed)
Continue your current medication  We will schedule you for a cardiopulmonary stress test.  We will refer you to electrophysiology for consideration of a defibrillator.  I will see you after this

## 2012-08-03 ENCOUNTER — Ambulatory Visit (HOSPITAL_COMMUNITY): Payer: Self-pay | Attending: Cardiology

## 2012-08-03 DIAGNOSIS — D649 Anemia, unspecified: Secondary | ICD-10-CM

## 2012-08-03 DIAGNOSIS — R0602 Shortness of breath: Secondary | ICD-10-CM

## 2012-08-03 DIAGNOSIS — I509 Heart failure, unspecified: Secondary | ICD-10-CM

## 2012-08-03 DIAGNOSIS — I420 Dilated cardiomyopathy: Secondary | ICD-10-CM

## 2012-08-04 ENCOUNTER — Encounter: Payer: Self-pay | Admitting: Internal Medicine

## 2012-08-04 ENCOUNTER — Ambulatory Visit (INDEPENDENT_AMBULATORY_CARE_PROVIDER_SITE_OTHER): Payer: Self-pay | Admitting: Internal Medicine

## 2012-08-04 VITALS — BP 145/93 | HR 72 | Ht 67.0 in | Wt 196.8 lb

## 2012-08-04 DIAGNOSIS — I420 Dilated cardiomyopathy: Secondary | ICD-10-CM

## 2012-08-04 DIAGNOSIS — I110 Hypertensive heart disease with heart failure: Secondary | ICD-10-CM | POA: Insufficient documentation

## 2012-08-04 DIAGNOSIS — I5022 Chronic systolic (congestive) heart failure: Secondary | ICD-10-CM

## 2012-08-04 DIAGNOSIS — I428 Other cardiomyopathies: Secondary | ICD-10-CM

## 2012-08-04 DIAGNOSIS — I509 Heart failure, unspecified: Secondary | ICD-10-CM

## 2012-08-04 MED ORDER — SPIRONOLACTONE 12.5 MG HALF TABLET: 12.5000 mg | ORAL_TABLET | Freq: Every day | ORAL | Status: DC

## 2012-08-04 NOTE — Patient Instructions (Addendum)
Your physician has recommended that you have a defibrillator inserted. An implantable cardioverter defibrillator (ICD) is a small device that is placed in your chest or, in rare cases, your abdomen. This device uses electrical pulses or shocks to help control life-threatening, irregular heartbeats that could lead the heart to suddenly stop beating (sudden cardiac arrest). Leads are attached to the ICD that goes into your heart. This is done in the hospital and usually requires an overnight stay. Please see the instruction sheet given to you today for more information.  Your physician has recommended you make the following change in your medication:   Start aldactone 12.5 mg daily  YOU WILL RECEIVE A CALL FROM OFFICE FOR DATE/ TIME AND ADDITIONAL QUESTIONS FROM DR Odessa Fleming NURSE TODAY OR TOMORROW.

## 2012-08-04 NOTE — Assessment & Plan Note (Signed)
The addition of Aldactone will help her blood pressure.  Potassium in July 3.7 and renal function was normal

## 2012-08-04 NOTE — Assessment & Plan Note (Signed)
She is stable class II heart failure. We'll adjust medications as above

## 2012-08-04 NOTE — Assessment & Plan Note (Signed)
The patient has nonischemic cardiomyopathy with persistent left ventricular dysfunction and a narrow QRS. She has class II heart failure symptoms and as such is an appropriate candidate for consideration of ICD implantation.  Have reviewed the potential benefits and risks of ICD implantation including but not limited to death, perforation of heart or lung, lead dislodgement, infection,  device malfunction and inappropriate shocks.  The patient and family express understanding  and are willing to proceed.    In addition with her heart failure symptoms she is an appropriate candidate for the use of aldosterone receptor antagonist therapy. We will start her on Aldactone 12.5 mg. We reviewed side effects.

## 2012-08-04 NOTE — Progress Notes (Signed)
ELECTROPHYSIOLOGY CONSULT NOTE  Patient ID: Sydney Shaffer, MRN: [id number], DOB/AGE: [birth year] 46 y.o. Admit date: (Not on file) Date of Consult: 08/04/2012  Primary Physician: Sheila Oats, MD Primary Cardiologist: PJ  Chief Complaint: icd   HPI  Mr if I had him on an as s [place] isSeen at the request of Dr. [place] for consideration of ICD implantation for nonischemic cardiomyopathy. She underwent catheterization April 2013 demonstrating normal coronaries. There is some question as to whether she has a viral cardiomyopathy following a prolonged call last fall. Electrocardiogram April 2013 demonstrated a narrow QRS  She is short of breath with significant exertion. She says she can do most of her cleaning with pushing and carrying with only mild shortness of breath. She has occasional nocturnal dyspnea/orthopnea although she sleeps on 3 pillows chronically.  Chest intermittent palpitations which are associated with diaphoresis lightheadedness and shortness of breath. These lasted seconds. In   Past Medical History  Diagnosis Date  . CHF (congestive heart failure)     Dx 03/2012 - dilated cardiomyopathy with EF 20-25% by echo (abnl nuc but normal coronaries 04/02/12 per cath.   . Anemia     felt to be due to heavy menstrual flow  . Chronic systolic heart failure   . Hypertensive heart disease with CHF       Surgical History:  Past Surgical History  Procedure Date  . Tubal ligation   . Appendectomy   . Cardiac catheterization April 2013    normal coronaries     Home Meds: Prior to Admission medications   Medication Sig Start Date End Date Taking? Authorizing Provider  aspirin EC 81 MG EC tablet Take 1 tablet (81 mg total) by mouth daily. 04/02/12 04/02/13 Yes Dayna N Dunn, PA  carvedilol (COREG) 25 MG tablet Take 0.5 tablets (12.5 mg total) by mouth 2 (two) times daily. 05/25/12 05/25/13 Yes Rosalio Macadamia, NP  ferrous sulfate 325 (65 FE) MG tablet Take 1 tablet (325 mg  total) by mouth daily with breakfast. 04/02/12 04/02/13 Yes Dayna N Dunn, PA  furosemide (LASIX) 40 MG tablet Take 40 mg by mouth daily. 05/05/12 05/05/13 Yes Rosalio Macadamia, NP  lisinopril (PRINIVIL,ZESTRIL) 10 MG tablet Take 1 tablet (10 mg total) by mouth 2 (two) times daily. 06/15/12 06/15/13 Yes Rosalio Macadamia, NP  Multiple Vitamin (MULITIVITAMIN WITH MINERALS) TABS Take 1 tablet by mouth daily.   Yes Historical Provider, MD  potassium chloride SA (K-DUR,KLOR-CON) 20 MEQ tablet Take 1 tablet (20 mEq total) by mouth daily. 04/02/12 04/02/13 Yes Dayna N Dunn, PA      Allergies: No Known Allergies  History   Social History  . Marital Status: Single    Spouse Name: [name]    Number of Children: 3  . Years of Education: [name]   Occupational History  . receptionist   . office cleaning    Social History Main Topics  . Smoking status: Never Smoker   . Smokeless tobacco: Never Used  . Alcohol Use: 1.2 oz/week    2 Glasses of wine per week     daily  . Drug Use: No  . Sexually Active: Yes    Birth Control/ Protection: Condom   Other Topics Concern  . Not on file   Social History Narrative  . No narrative on file     Family History  Problem Relation Age of Onset  . Breast cancer Mother   . Cancer Father      ROS:  Please see  the history of present illness.     All other systems reviewed and negative.    Physical Exam: Blood pressure 145/93, pulse 72, height 5\' 7"  (1.702 m), weight 196 lb 12.8 oz (89.268 kg). General: Well developed, well nourished afircan [place] female in no acute distress. Head: Normocephalic, atraumatic, sclera non-icteric, no xanthomas, nares are without discharge. Lymph Nodes:  none Back: without scoliosis/kyphosis, no CVA tendersness Neck: Negative for carotid bruits. JVD not elevated. Lungs: Clear bilaterally to auscultation without wheezes, rales, or rhonchi. Breathing is unlabored. Heart: RRR with S1 S2. No / murmur , rubs, or gallops  appreciated. Abdomen: Soft, non-tender, non-distended with normoactive bowel sounds. No hepatomegaly. No rebound/guarding. No obvious abdominal masses. Msk:  Strength and tone appear normal for age. Extremities: No clubbing or cyanosis. No edema.  Distal pedal pulses are 2+ and equal bilaterally. Skin: Warm and Dry Neuro: Alert and oriented X 3. CN III-XII intact Grossly normal sensory and motor function . Psych:  Responds to questions appropriately with a normal affect.      Labs: Cardiac Enzymes No results found for this basename: CKTOTAL:4,CKMB:4,TROPONINI:4 in the last 72 hours CBC Lab Results  Component Value Date   WBC 7.1 05/05/2012   HGB 12.1 05/05/2012   HCT 36.9 05/05/2012   MCV 79.8 05/05/2012   PLT 241.0 05/05/2012   PROTIME: No results found for this basename: LABPROT:3,INR:3 in the last 72 hours Chemistry No results found for this basename: NA,K,CL,CO2,BUN,CREATININE,CALCIUM,LABALBU,PROT,BILITOT,ALKPHOS,ALT,AST,GLUCOSE in the last 168 hours Lipids Lab Results  Component Value Date   CHOL 124 03/30/2012   HDL 50 03/30/2012   LDLCALC 59 03/30/2012   TRIG 74 03/30/2012   BNP Pro B Natriuretic peptide (BNP)  Date/Time Value Range Status  05/05/2012  9:56 AM 227.0* 0.0 - 100.0 pg/mL Final  03/29/2012  3:09 PM 1829.0* 0 - 125 pg/mL Final   Miscellaneous Lab Results  Component Value Date   DDIMER <0.22 03/29/2012    Radiology/Studies:  No results found.  EKG: Sinus with narrow QRS   Assessment and Plan:  Sydney Shaffer

## 2012-08-05 ENCOUNTER — Telehealth: Payer: Self-pay | Admitting: *Deleted

## 2012-08-05 DIAGNOSIS — I5022 Chronic systolic (congestive) heart failure: Secondary | ICD-10-CM

## 2012-08-05 DIAGNOSIS — I420 Dilated cardiomyopathy: Secondary | ICD-10-CM

## 2012-08-05 NOTE — Telephone Encounter (Signed)
I left a message for the patient to call to schedule her ICD implant.

## 2012-08-06 ENCOUNTER — Encounter: Payer: Self-pay | Admitting: *Deleted

## 2012-08-06 ENCOUNTER — Encounter (HOSPITAL_COMMUNITY): Payer: Self-pay | Admitting: Pharmacy Technician

## 2012-08-06 NOTE — Telephone Encounter (Signed)
I spoke with the patient. She is scheduled for 08/13/12 with Dr. Graciela Husbands for her ICD implant. Pre-procedure instructions reviewed with the patient. She will come on 9/3 for labs to be done. She will also pick up a written copy of her instructions at the front desk then.

## 2012-08-06 NOTE — Telephone Encounter (Signed)
F/u   Pt calling for status on this issue,  She can be reached at [phone].

## 2012-08-06 NOTE — Telephone Encounter (Signed)
I left a message for the patient I will call her back.

## 2012-08-06 NOTE — Addendum Note (Signed)
Addended by: Sherri Rad C on: 08/06/2012 03:57 PM   Modules accepted: Orders

## 2012-08-06 NOTE — Telephone Encounter (Signed)
Patient returning nurse call regarding defib placement, she can be reached at [phone].

## 2012-08-10 ENCOUNTER — Ambulatory Visit (INDEPENDENT_AMBULATORY_CARE_PROVIDER_SITE_OTHER): Payer: Self-pay | Admitting: *Deleted

## 2012-08-10 DIAGNOSIS — I5022 Chronic systolic (congestive) heart failure: Secondary | ICD-10-CM

## 2012-08-10 DIAGNOSIS — I428 Other cardiomyopathies: Secondary | ICD-10-CM

## 2012-08-10 DIAGNOSIS — I420 Dilated cardiomyopathy: Secondary | ICD-10-CM

## 2012-08-10 LAB — CBC WITH DIFFERENTIAL/PLATELET
Eosinophils Relative: 3.7 % (ref 0.0–5.0)
HCT: 37.6 % (ref 36.0–46.0)
Hemoglobin: 12.1 g/dL (ref 12.0–15.0)
Lymphs Abs: 2.2 10*3/uL (ref 0.7–4.0)
MCV: 86.7 fl (ref 78.0–100.0)
Monocytes Absolute: 0.5 10*3/uL (ref 0.1–1.0)
Monocytes Relative: 5.3 % (ref 3.0–12.0)
Neutro Abs: 5.8 10*3/uL (ref 1.4–7.7)
RDW: 14.9 % — ABNORMAL HIGH (ref 11.5–14.6)
WBC: 8.8 10*3/uL (ref 4.5–10.5)

## 2012-08-10 LAB — BASIC METABOLIC PANEL
BUN: 16 mg/dL (ref 6–23)
Chloride: 102 mEq/L (ref 96–112)
GFR: 101.73 mL/min (ref >60.00)
Glucose, Bld: 109 mg/dL — ABNORMAL HIGH (ref 70–99)
Potassium: 3.8 mEq/L (ref 3.5–5.1)
Sodium: 134 mEq/L — ABNORMAL LOW (ref 135–145)

## 2012-08-11 ENCOUNTER — Other Ambulatory Visit: Payer: Self-pay | Admitting: Internal Medicine

## 2012-08-11 DIAGNOSIS — I428 Other cardiomyopathies: Secondary | ICD-10-CM

## 2012-08-12 MED ORDER — SODIUM CHLORIDE 0.9 % IR SOLN
80.0000 mg | Status: DC
  Filled 2012-08-12: qty 2

## 2012-08-12 MED ORDER — CEFAZOLIN SODIUM-DEXTROSE 2-3 GM-% IV SOLR
2.0000 g | INTRAVENOUS | Status: DC
  Filled 2012-08-12 (×2): qty 50

## 2012-08-13 ENCOUNTER — Encounter (HOSPITAL_COMMUNITY): Payer: Self-pay | Admitting: General Practice

## 2012-08-13 ENCOUNTER — Other Ambulatory Visit: Payer: Self-pay

## 2012-08-13 ENCOUNTER — Ambulatory Visit (HOSPITAL_COMMUNITY)
Admission: RE | Admit: 2012-08-13 | Discharge: 2012-08-14 | Disposition: A | Discharge status: Home / Self Care | Payer: MEDICAID | Source: Ambulatory Visit | Attending: Internal Medicine | Admitting: Internal Medicine

## 2012-08-13 ENCOUNTER — Encounter (HOSPITAL_COMMUNITY)
Admission: RE | Disposition: A | Discharge status: Home / Self Care | Payer: Self-pay | Source: Ambulatory Visit | Attending: Internal Medicine

## 2012-08-13 DIAGNOSIS — I509 Heart failure, unspecified: Secondary | ICD-10-CM | POA: Insufficient documentation

## 2012-08-13 DIAGNOSIS — I428 Other cardiomyopathies: Secondary | ICD-10-CM | POA: Insufficient documentation

## 2012-08-13 DIAGNOSIS — Z95810 Presence of automatic (implantable) cardiac defibrillator: Secondary | ICD-10-CM

## 2012-08-13 DIAGNOSIS — R0602 Shortness of breath: Secondary | ICD-10-CM

## 2012-08-13 DIAGNOSIS — I498 Other specified cardiac arrhythmias: Secondary | ICD-10-CM | POA: Insufficient documentation

## 2012-08-13 DIAGNOSIS — I110 Hypertensive heart disease with heart failure: Secondary | ICD-10-CM | POA: Insufficient documentation

## 2012-08-13 DIAGNOSIS — I5022 Chronic systolic (congestive) heart failure: Secondary | ICD-10-CM | POA: Insufficient documentation

## 2012-08-13 HISTORY — PX: OTHER SURGICAL HISTORY: SHX169

## 2012-08-13 HISTORY — DX: Cardiac arrhythmia, unspecified: I49.9

## 2012-08-13 HISTORY — DX: Essential (primary) hypertension: I10

## 2012-08-13 HISTORY — DX: Presence of automatic (implantable) cardiac defibrillator: Z95.810

## 2012-08-13 HISTORY — PX: IMPLANTABLE CARDIOVERTER DEFIBRILLATOR IMPLANT: SHX5473

## 2012-08-13 LAB — SURGICAL PCR SCREEN: Staphylococcus aureus: NEGATIVE

## 2012-08-13 SURGERY — IMPLANTABLE CARDIOVERTER DEFIBRILLATOR IMPLANT
Anesthesia: LOCAL

## 2012-08-13 MED ORDER — ONDANSETRON HCL 4 MG/2ML IJ SOLN
4.0000 mg | Freq: Four times a day (QID) | INTRAMUSCULAR | Status: DC | PRN
  Administered 2012-08-13: 4 mg via INTRAVENOUS
  Filled 2012-08-13: qty 2

## 2012-08-13 MED ORDER — MUPIROCIN 2 % EX OINT
TOPICAL_OINTMENT | Freq: Two times a day (BID) | CUTANEOUS | Status: DC
Start: 2012-08-13 — End: 2012-08-13
  Administered 2012-08-13: 1 via NASAL
  Administered 2012-08-13: 09:00:00 via NASAL

## 2012-08-13 MED ORDER — SODIUM CHLORIDE 0.9 % IV SOLN
250.0000 mL | INTRAVENOUS | Status: DC
  Administered 2012-08-13: 09:00:00 via INTRAVENOUS

## 2012-08-13 MED ORDER — HEPARIN SODIUM (PORCINE) 1000 UNIT/ML IJ SOLN
INTRAMUSCULAR | Status: AC
  Filled 2012-08-13: qty 1

## 2012-08-13 MED ORDER — MUPIROCIN 2 % EX OINT
TOPICAL_OINTMENT | CUTANEOUS | Status: AC
  Filled 2012-08-13: qty 22

## 2012-08-13 MED ORDER — PNEUMOCOCCAL VAC POLYVALENT 25 MCG/0.5ML IJ INJ
0.5000 mL | INJECTION | INTRAMUSCULAR | Status: AC
  Administered 2012-08-14: 0.5 mL via INTRAMUSCULAR
  Filled 2012-08-13: qty 0.5

## 2012-08-13 MED ORDER — FERROUS SULFATE 325 (65 FE) MG PO TABS
325.0000 mg | ORAL_TABLET | Freq: Every day | ORAL | Status: DC
  Administered 2012-08-14: 325 mg via ORAL
  Filled 2012-08-13 (×2): qty 1

## 2012-08-13 MED ORDER — POTASSIUM CHLORIDE CRYS ER 20 MEQ PO TBCR
20.0000 meq | EXTENDED_RELEASE_TABLET | Freq: Every day | ORAL | Status: DC
  Administered 2012-08-13 – 2012-08-14 (×2): 20 meq via ORAL
  Filled 2012-08-13 (×2): qty 1

## 2012-08-13 MED ORDER — LISINOPRIL 10 MG PO TABS
10.0000 mg | ORAL_TABLET | Freq: Two times a day (BID) | ORAL | Status: DC
  Administered 2012-08-13 – 2012-08-14 (×3): 10 mg via ORAL
  Filled 2012-08-13 (×5): qty 1

## 2012-08-13 MED ORDER — ADULT MULTIVITAMIN W/MINERALS CH
1.0000 | ORAL_TABLET | Freq: Every day | ORAL | Status: DC
  Administered 2012-08-13 – 2012-08-14 (×2): 1 via ORAL
  Filled 2012-08-13 (×2): qty 1

## 2012-08-13 MED ORDER — CARVEDILOL 12.5 MG PO TABS
12.5000 mg | ORAL_TABLET | Freq: Two times a day (BID) | ORAL | Status: DC
  Administered 2012-08-13 – 2012-08-14 (×2): 12.5 mg via ORAL
  Filled 2012-08-13 (×4): qty 1

## 2012-08-13 MED ORDER — SODIUM CHLORIDE 0.45 % IV SOLN
INTRAVENOUS | Status: DC
  Administered 2012-08-13: 09:00:00 via INTRAVENOUS

## 2012-08-13 MED ORDER — MIDAZOLAM HCL 5 MG/5ML IJ SOLN
INTRAMUSCULAR | Status: AC
  Filled 2012-08-13: qty 5

## 2012-08-13 MED ORDER — ASPIRIN EC 81 MG PO TBEC
81.0000 mg | DELAYED_RELEASE_TABLET | Freq: Every day | ORAL | Status: DC
  Administered 2012-08-13 – 2012-08-14 (×2): 81 mg via ORAL
  Filled 2012-08-13 (×2): qty 1

## 2012-08-13 MED ORDER — LIDOCAINE HCL (PF) 1 % IJ SOLN
INTRAMUSCULAR | Status: AC
  Filled 2012-08-13: qty 30

## 2012-08-13 MED ORDER — CHLORHEXIDINE GLUCONATE 4 % EX LIQD: 60.0000 mL | Freq: Once | CUTANEOUS | Status: DC

## 2012-08-13 MED ORDER — LIDOCAINE HCL (PF) 1 % IJ SOLN
INTRAMUSCULAR | Status: AC
  Filled 2012-08-13: qty 60

## 2012-08-13 MED ORDER — SPIRONOLACTONE 12.5 MG HALF TABLET
12.5000 mg | ORAL_TABLET | Freq: Every day | ORAL | Status: DC
  Administered 2012-08-13 – 2012-08-14 (×2): 12.5 mg via ORAL
  Filled 2012-08-13 (×2): qty 1

## 2012-08-13 MED ORDER — SODIUM CHLORIDE 0.9 % IJ SOLN: 3.0000 mL | Freq: Two times a day (BID) | INTRAMUSCULAR | Status: DC

## 2012-08-13 MED ORDER — CEFAZOLIN SODIUM 1-5 GM-% IV SOLN
1.0000 g | Freq: Four times a day (QID) | INTRAVENOUS | Status: AC
  Administered 2012-08-13 (×2): 1 g via INTRAVENOUS
  Filled 2012-08-13 (×2): qty 50

## 2012-08-13 MED ORDER — FUROSEMIDE 40 MG PO TABS
40.0000 mg | ORAL_TABLET | Freq: Every day | ORAL | Status: DC
  Administered 2012-08-13 – 2012-08-14 (×2): 40 mg via ORAL
  Filled 2012-08-13 (×2): qty 1

## 2012-08-13 MED ORDER — ACETAMINOPHEN 325 MG PO TABS: 325.0000 mg | ORAL_TABLET | ORAL | Status: DC | PRN

## 2012-08-13 MED ORDER — SODIUM CHLORIDE 0.9 % IJ SOLN: 3.0000 mL | INTRAMUSCULAR | Status: DC | PRN

## 2012-08-13 MED ORDER — FENTANYL CITRATE 0.05 MG/ML IJ SOLN
INTRAMUSCULAR | Status: AC
  Filled 2012-08-13: qty 2

## 2012-08-13 MED ORDER — OXYCODONE-ACETAMINOPHEN 5-325 MG PO TABS
1.0000 | ORAL_TABLET | Freq: Four times a day (QID) | ORAL | Status: DC | PRN
  Administered 2012-08-13 (×3): 1 via ORAL
  Administered 2012-08-14: 2 via ORAL
  Filled 2012-08-13 (×3): qty 1
  Filled 2012-08-13: qty 2

## 2012-08-13 MED ORDER — SODIUM CHLORIDE 0.9 % IV SOLN
INTRAVENOUS | Status: DC
  Administered 2012-08-13: 13:00:00 via INTRAVENOUS

## 2012-08-13 NOTE — Interval H&P Note (Signed)
History and Physical Interval Note:  08/13/2012 9:49 AM  Sydney Shaffer  has presented today for surgery, with the diagnosis of Heart block  The various methods of treatment have been discussed with the patient and family. After consideration of risks, benefits and other options for treatment, the patient has consented to  Procedure(s) (LRB) with comments: IMPLANTABLE CARDIOVERTER DEFIBRILLATOR IMPLANT ([name]) as a surgical intervention .  The patient's history has been reviewed, patient examined, no change in status, stable for surgery.  I have reviewed the patient's chart and labs.  Questions were answered to the patient's satisfaction.     Sherryl Manges

## 2012-08-13 NOTE — CV Procedure (Signed)
Virdia Ziesmer [id number]  [id number]  Preop NW:GNFA and Bradycardia  HR <50 Postop Dx same/  Procedure: dual  chamber ICD implantation with intraoperative defibrillation threshold testing  Following the obtaining of informed consent the patient was brought to the electrophysiology laboratory in place of the fluoroscopic table in the supine position. After routine prep and drape, lidocaine was infiltrated in the prepectoral subclavicular region and an incision was made and carried down to the layer of the prepectoral fascia using electrocautery and sharp dissection. A pocket was formed similarly.  Thereafter  attention was turned to gaining access to the extrathoracic left subclavian vein which was accomplished without  difficulty and without the aspiration of air or puncture of the artery.Two separate venipunctures were accomplished  Sequentially  A 9 French sheath  And 45F sheath were placed through which were   passed a [employer] single coil  active fixation defibrillator lead, model 0181 serial number [id number] and a Medtronic 5076 active fixation atrial lead, serial number [id number] .  They  Were   passed under fluoroscopic guidance to the right ventricular apex and R atrial appendage  respectively.    In its location the bipolar R wave was 9.9  millivolts, impedance was 681 ohms, the pacing threshold was 0.8 volts at 0.5 msec. Current at threshold was 1.2 mA.  There was no diaphragmatic pacing at 10 V. The current of injury was  MDERATE.  The bipolar P wave was 3.9 millivolts, impedance was 768 ohms, the pacing threshold was 0.8 volts at 0.5 msec. Current at threshold was 1.0 mA.  There was no diaphragmatic pacing at 10 V. The current of injury was brisk.   The leads were secured to the prepectoral fascia and then attached to a Medtronic ICD, serial number  [id number].  Through the device, the bipolar R wave was 10.6  millivolts, impedance was 513 ohms, the pacing threshold was 1.0 volts at  0.4 msec.  The bipolar P wave was 2.8 millivolts, impedance was 513 ohms, the pacing threshold was 0.5 volts at 0.4 msec. High-voltage impedance was  62 ohms.      The pocket was copiously irrigated with antibiotic containing saline solution. Hemostasis was assured, and the device and the leads were placed in the pocket and secured to the prepectoral fascia.  The wound was closed in 2 layers in normal fashion. The wound was washed dried and a benzoin Steri-Strips dressing was then applied. Needle counts, sponge counts and instrument counts were correct at the end of the procedure according to the staff.  At this point, I scrubbed out of the procedure and defibrillation threshold testing was undertaken.   VF was induced via the t-wave shock.   After a total duration of 8 sec, a 15.3 joule shock was delivered through a resistance of 68 ohms terminating VF and restoring sinus rhythm

## 2012-08-13 NOTE — H&P (View-Only) (Signed)
 ELECTROPHYSIOLOGY CONSULT NOTE  Patient ID: Sydney Shaffer, MRN: [id number], DOB/AGE: 47/25/1967 47 y.o. Admit date: (Not on file) Date of Consult: 08/04/2012  Primary Physician: DEFAULT,PROVIDER, MD Primary Cardiologist: PJ  Chief Complaint: icd   HPI  Mr if I had him on an as s [place] isSeen at the request of Dr. Jordan for consideration of ICD implantation for nonischemic cardiomyopathy. She underwent catheterization April 2013 demonstrating normal coronaries. There is some question as to whether she has a viral cardiomyopathy following a prolonged call last fall. Electrocardiogram April 2013 demonstrated a narrow QRS  She is short of breath with significant exertion. She says she can do most of her cleaning with pushing and carrying with only mild shortness of breath. She has occasional nocturnal dyspnea/orthopnea although she sleeps on 3 pillows chronically.  Chest intermittent palpitations which are associated with diaphoresis lightheadedness and shortness of breath. These lasted seconds. In   Past Medical History  Diagnosis Date  . CHF (congestive heart failure)     Dx 03/2012 - dilated cardiomyopathy with EF 20-25% by echo (abnl nuc but normal coronaries 04/02/12 per cath.   . Anemia     felt to be due to heavy menstrual flow  . Chronic systolic heart failure   . Hypertensive heart disease with CHF       Surgical History:  Past Surgical History  Procedure Date  . Tubal ligation   . Appendectomy   . Cardiac catheterization April 2013    normal coronaries     Home Meds: Prior to Admission medications   Medication Sig Start Date End Date Taking? Authorizing Provider  aspirin EC 81 MG EC tablet Take 1 tablet (81 mg total) by mouth daily. 04/02/12 04/02/13 Yes Dayna N Dunn, PA  carvedilol (COREG) 25 MG tablet Take 0.5 tablets (12.5 mg total) by mouth 2 (two) times daily. 05/25/12 05/25/13 Yes Lori C Gerhardt, NP  ferrous sulfate 325 (65 FE) MG tablet Take 1 tablet (325 mg  total) by mouth daily with breakfast. 04/02/12 04/02/13 Yes Dayna N Dunn, PA  furosemide (LASIX) 40 MG tablet Take 40 mg by mouth daily. 05/05/12 05/05/13 Yes Lori C Gerhardt, NP  lisinopril (PRINIVIL,ZESTRIL) 10 MG tablet Take 1 tablet (10 mg total) by mouth 2 (two) times daily. 06/15/12 06/15/13 Yes Lori C Gerhardt, NP  Multiple Vitamin (MULITIVITAMIN WITH MINERALS) TABS Take 1 tablet by mouth daily.   Yes Historical Provider, MD  potassium chloride SA (K-DUR,KLOR-CON) 20 MEQ tablet Take 1 tablet (20 mEq total) by mouth daily. 04/02/12 04/02/13 Yes Dayna N Dunn, PA      Allergies: No Known Allergies  History   Social History  . Marital Status: Single    Spouse Name: [name]    Number of Children: 3  . Years of Education: [name]   Occupational History  . receptionist   . office cleaning    Social History Main Topics  . Smoking status: Never Smoker   . Smokeless tobacco: Never Used  . Alcohol Use: 1.2 oz/week    2 Glasses of wine per week     daily  . Drug Use: No  . Sexually Active: Yes    Birth Control/ Protection: Condom   Other Topics Concern  . Not on file   Social History Narrative  . No narrative on file     Family History  Problem Relation Age of Onset  . Breast cancer Mother   . Cancer Father      ROS:  Please see   the history of present illness.     All other systems reviewed and negative.    Physical Exam: Blood pressure 145/93, pulse 72, height 5' 7" (1.702 m), weight 196 lb 12.8 oz (89.268 kg). General: Well developed, well nourished afircan [place] female in no acute distress. Head: Normocephalic, atraumatic, sclera non-icteric, no xanthomas, nares are without discharge. Lymph Nodes:  none Back: without scoliosis/kyphosis, no CVA tendersness Neck: Negative for carotid bruits. JVD not elevated. Lungs: Clear bilaterally to auscultation without wheezes, rales, or rhonchi. Breathing is unlabored. Heart: RRR with S1 S2. No / murmur , rubs, or gallops  appreciated. Abdomen: Soft, non-tender, non-distended with normoactive bowel sounds. No hepatomegaly. No rebound/guarding. No obvious abdominal masses. Msk:  Strength and tone appear normal for age. Extremities: No clubbing or cyanosis. No edema.  Distal pedal pulses are 2+ and equal bilaterally. Skin: Warm and Dry Neuro: Alert and oriented X 3. CN III-XII intact Grossly normal sensory and motor function . Psych:  Responds to questions appropriately with a normal affect.      Labs: Cardiac Enzymes No results found for this basename: CKTOTAL:4,CKMB:4,TROPONINI:4 in the last 72 hours CBC Lab Results  Component Value Date   WBC 7.1 05/05/2012   HGB 12.1 05/05/2012   HCT 36.9 05/05/2012   MCV 79.8 05/05/2012   PLT 241.0 05/05/2012   PROTIME: No results found for this basename: LABPROT:3,INR:3 in the last 72 hours Chemistry No results found for this basename: NA,K,CL,CO2,BUN,CREATININE,CALCIUM,LABALBU,PROT,BILITOT,ALKPHOS,ALT,AST,GLUCOSE in the last 168 hours Lipids Lab Results  Component Value Date   CHOL 124 03/30/2012   HDL 50 03/30/2012   LDLCALC 59 03/30/2012   TRIG 74 03/30/2012   BNP Pro B Natriuretic peptide (BNP)  Date/Time Value Range Status  05/05/2012  9:56 AM 227.0* 0.0 - 100.0 pg/mL Final  03/29/2012  3:09 PM 1829.0* 0 - 125 pg/mL Final   Miscellaneous Lab Results  Component Value Date   DDIMER <0.22 03/29/2012    Radiology/Studies:  No results found.  EKG: Sinus with narrow QRS   Assessment and Plan:  Steven Klein   

## 2012-08-14 ENCOUNTER — Ambulatory Visit (HOSPITAL_COMMUNITY): Payer: Self-pay

## 2012-08-14 DIAGNOSIS — I428 Other cardiomyopathies: Secondary | ICD-10-CM

## 2012-08-14 MED ORDER — OXYCODONE-ACETAMINOPHEN 5-325 MG PO TABS: 1.0000 | ORAL_TABLET | Freq: Four times a day (QID) | ORAL | Status: AC | PRN

## 2012-08-14 NOTE — Discharge Summary (Signed)
CARDIOLOGY DISCHARGE SUMMARY   Patient ID: Sydney Shaffer MRN: [id number] DOB/AGE: 1966/10/29 46 y.o.  Admit date: 08/13/2012 Discharge date: 08/14/2012  Primary Discharge Diagnosis:  Nonischemic cardiomyopathy and bradycardia - s/p Medtronic ICD, serial number [id number]  Secondary Discharge Diagnosis:  Past Medical History  Diagnosis Date  . CHF (congestive heart failure)     Dx 03/2012 - dilated cardiomyopathy with EF 20-25% by echo (abnl nuc but normal coronaries 04/02/12 per cath.   . Anemia     felt to be due to heavy menstrual flow  . Chronic systolic heart failure   . Hypertensive heart disease with CHF   . Hypertension   . Dysrhythmia     Bradycardia  . 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 Shortness of breath    Procedures: dual chamber ICD implantation with intraoperative defibrillation threshold testing  Hospital Course: Sydney Shaffer is a 46 year old female with a history of nonischemic cardiomyopathy. She also had some bradycardia. She was evaluated by Dr. Graciela Husbands and an ICD was indicated. She came to the hospital for the procedure on 08/13/2012.  She had successful implantation of a Medtronic, dual-lead ICD without immediate complication. On 08/14/2012, her chest x-ray did not show any acute abnormalities. The device was checked and functioning normally. Her pain was under adequate control with oral medications. She was seen by Dr. Dietrich Pates and considered stable for discharge, to follow up as an outpatient.  Labs:   Lab Results  Component Value Date   WBC 8.8 08/10/2012   HGB 12.1 08/10/2012   HCT 37.6 08/10/2012   MCV 86.7 08/10/2012   PLT 273.0 08/10/2012    Lab 08/10/12 1436  NA 134*  K 3.8  CL 102  CO2 23  BUN 16  CREATININE 0.8  CALCIUM 9.5  PROT --  BILITOT --  ALKPHOS --  ALT --  AST --  GLUCOSE 109*   Radiology: Dg Chest 2 View  08/14/2012  *RADIOLOGY REPORT*  Clinical Data: Post pacemaker insertion  CHEST - 2 VIEW  Comparison:  03/31/2012  Findings:  Grossly unchanged cardiac silhouette and mediastinal contours. Interval placement of left anterior chest wall dual lead AICD / pacemaker with tips overlying expected location of the right atrium and ventricle.  Improved inspiratory effort.  No focal airspace opacities.  No definite evidence of pulmonary edema.  No pneumothorax.  Unchanged bones.  IMPRESSION: 1.  Interval placement of left anterior chest wall AICD/pacemaker without evidence of complication. 2.  No acute cardiopulmonary disease.   Original Report Authenticated By: Waynard Reeds, M.D.    FOLLOW UP PLANS AND APPOINTMENTS No Known Allergies Medication List  As of 08/14/2012 11:29 AM   TAKE these medications         aspirin EC 81 MG tablet   Take 81 mg by mouth daily.      carvedilol 25 MG tablet   Commonly known as: COREG   Take 12.5 mg by mouth 2 (two) times daily with a meal.      ferrous sulfate 325 (65 FE) MG tablet   Take 325 mg by mouth daily with breakfast.      furosemide 40 MG tablet   Commonly known as: LASIX   Take 40 mg by mouth daily.      lisinopril 10 MG tablet   Commonly known as: PRINIVIL,ZESTRIL   Take 10 mg by mouth 2 (two) times daily.      multivitamin with minerals Tabs   Take 1 tablet  by mouth daily.      oxyCODONE-acetaminophen 5-325 MG per tablet   Commonly known as: PERCOCET/ROXICET   Take 1-2 tablets by mouth every 6 (six) hours as needed.      potassium chloride SA 20 MEQ tablet   Commonly known as: K-DUR,KLOR-CON   Take 20 mEq by mouth daily.      SLEEP AID PO   Take 1 tablet by mouth at bedtime as needed. For sleep.      spironolactone 25 MG tablet   Commonly known as: ALDACTONE   Take 12.5 mg by mouth daily.            Discharge Orders    Future Orders Please Complete By Expires   Diet - low sodium heart healthy      Increase activity slowly        Follow-up Information    Follow up with  [facility] (Wound check and device check, the  office will call.)    Contact information:   [address] [place] [address]       Follow up with Lewayne Bunting, MD. (See in 3 months, the office will call)    Contact information:   [address] [place] [address] [phone]          BRING ALL MEDICATIONS WITH YOU TO FOLLOW UP APPOINTMENTS  Time spent with patient to include physician time: 32 min Signed: Theodore Demark 08/14/2012, 11:29 AM Co-Sign MD

## 2012-08-14 NOTE — Progress Notes (Signed)
Sydney Shaffer  46 y.o.  female  Subjective: No significant problems overnight.  Allergy: Review of patient's allergies indicates no known allergies.  Objective: Vital signs in last 24 hours: Temp:  [98 F (36.7 C)-98.8 F (37.1 C)] 98.8 F (37.1 C) (09/07 0500) Pulse Rate:  [61-71] 64  (09/07 0500) Resp:  [18] 18  (09/07 0500) BP: (108-121)/(70-88) 117/87 mmHg (09/07 0500) SpO2:  [92 %-100 %] 99 % (09/07 0500)  89.812 kg (198 lb) Body mass index is 31.01 kg/(m^2).  Weight change:  Last BM Date: 08/12/12   General- Well developed; no acute distress  Neck- No JVD, no carotid bruits Lungs- clear lung fields; normal I:E ratio Cardiovascular- normal PMI; normal S1 and S2 Chest- benign device insertion site with minimal blood on dressing, no drainage and no swelling. Abdomen- normal bowel sounds; soft and non-tender without masses or organomegaly  Imaging Studies/Results: Dg Chest 2 View  08/14/2012  *RADIOLOGY REPORT*  Clinical Data: Post pacemaker insertion  CHEST - 2 VIEW  Comparison: 03/31/2012  Findings:  Grossly unchanged cardiac silhouette and mediastinal contours. Interval placement of left anterior chest wall dual lead AICD / pacemaker with tips overlying expected location of the right atrium and ventricle.  Improved inspiratory effort.  No focal airspace opacities.  No definite evidence of pulmonary edema.  No pneumothorax.  Unchanged bones.  IMPRESSION: 1.  Interval placement of left anterior chest wall AICD/pacemaker without evidence of complication. 2.  No acute cardiopulmonary disease.   Original Report Authenticated By: Waynard Reeds, M.D.    Assessment/Plan: Doing well following AICD implantation. Patient advised regarding care of the operative site and activity. Return office visit will be arranged.   LOS: 1 day   Davison Bing 08/14/2012, 11:01 AM

## 2012-08-14 NOTE — Plan of Care (Signed)
Problem: Phase II Progression Outcomes Goal: No evidence of ICD malfunction Outcome: Completed/Met Date Met:  08/14/12 Rep from [employer] here this AM to check device, c-xray completed Goal: Pain controlled Outcome: Adequate for Discharge Pain med effective in taking pain from 7/10 to 3/10 Goal: Site without bleeding or hematoma Outcome: Completed/Met Date Met:  08/14/12 No increase in stain mark on LUC dsg Goal: Limited arm movement per orders Outcome: Completed/Met Date Met:  08/14/12 Sling in place to left arm

## 2012-08-16 ENCOUNTER — Encounter: Payer: Self-pay | Admitting: *Deleted

## 2012-08-16 ENCOUNTER — Telehealth: Payer: Self-pay | Admitting: *Deleted

## 2012-08-16 DIAGNOSIS — Z95810 Presence of automatic (implantable) cardiac defibrillator: Secondary | ICD-10-CM | POA: Insufficient documentation

## 2012-08-16 NOTE — Telephone Encounter (Signed)
Left message for patient to call and schedule PFT, per Dr. [place].

## 2012-08-23 ENCOUNTER — Encounter: Payer: Self-pay | Admitting: Internal Medicine

## 2012-08-23 ENCOUNTER — Ambulatory Visit (INDEPENDENT_AMBULATORY_CARE_PROVIDER_SITE_OTHER): Payer: Self-pay | Admitting: *Deleted

## 2012-08-23 DIAGNOSIS — I428 Other cardiomyopathies: Secondary | ICD-10-CM

## 2012-08-23 DIAGNOSIS — I420 Dilated cardiomyopathy: Secondary | ICD-10-CM

## 2012-08-23 LAB — ICD DEVICE OBSERVATION
AL THRESHOLD: 0.375 V
BAMS-0001: 170 {beats}/min
BATTERY VOLTAGE: 3.2062 V
FVT: 0
HV IMPEDENCE: 58 Ohm
PACEART VT: 0
RV LEAD AMPLITUDE: 12.6 mv
TOT-0002: 0
TZAT-0001SLOWVT: 1
TZAT-0001SLOWVT: 2
TZAT-0002ATACH: NEGATIVE
TZAT-0002FASTVT: NEGATIVE
TZAT-0004SLOWVT: 8
TZAT-0004SLOWVT: 8
TZAT-0005SLOWVT: 88 pct
TZAT-0005SLOWVT: 91 pct
TZAT-0012ATACH: 150 ms
TZAT-0012FASTVT: 170 ms
TZAT-0013SLOWVT: 3
TZAT-0018FASTVT: NEGATIVE
TZAT-0019ATACH: 6 V
TZAT-0019ATACH: 6 V
TZAT-0019FASTVT: 8 V
TZAT-0019SLOWVT: 8 V
TZAT-0020ATACH: 1.5 ms
TZAT-0020ATACH: 1.5 ms
TZAT-0020SLOWVT: 1.5 ms
TZON-0004SLOWVT: 32
TZST-0001ATACH: 4
TZST-0001ATACH: 5
TZST-0001FASTVT: 6
TZST-0001SLOWVT: 4
TZST-0002ATACH: NEGATIVE
TZST-0002ATACH: NEGATIVE
TZST-0002FASTVT: NEGATIVE
TZST-0002FASTVT: NEGATIVE
TZST-0002FASTVT: NEGATIVE
TZST-0002FASTVT: NEGATIVE
TZST-0003SLOWVT: 35 J
TZST-0003SLOWVT: 35 J

## 2012-08-23 NOTE — Progress Notes (Signed)
Wound check defib in clinic  

## 2012-08-25 ENCOUNTER — Ambulatory Visit (INDEPENDENT_AMBULATORY_CARE_PROVIDER_SITE_OTHER): Payer: Self-pay | Admitting: Internal Medicine

## 2012-08-25 DIAGNOSIS — R0600 Dyspnea, unspecified: Secondary | ICD-10-CM

## 2012-08-25 DIAGNOSIS — R0989 Other specified symptoms and signs involving the circulatory and respiratory systems: Secondary | ICD-10-CM

## 2012-08-25 LAB — PULMONARY FUNCTION TEST

## 2012-08-25 NOTE — Progress Notes (Signed)
PFT done today. 

## 2012-09-02 ENCOUNTER — Encounter: Payer: Self-pay | Admitting: Cardiology

## 2012-09-30 ENCOUNTER — Telehealth: Payer: Self-pay | Admitting: Internal Medicine

## 2012-09-30 NOTE — Telephone Encounter (Signed)
Sydney Shaffer noticed she was hot and mildly lightheaded while showering. She got out of shower and went to turn off heater when she felt her chest get heavy and she received  an ICD firing. She did not pass out before or after. She called the answering service and was transferred to the [facility] pager. She feels well after the shock - just shaken up. She has no lightheadedness, no chest pain, no presyncope. She has not been unwell and she's not currently ill. She's never had an ICD firing. We talked about typical  isolated ICD shocks - take it easy, ok to stay at home (she lives ~ 10 minutes away and is not home alone) unless any symptoms change or ICD fires again or can come in for observation. She is not home alone. She prefers to stay home right now to settle down and she may decide to come in. She knows to call immediately if any symptoms change or repeat ICD firing. Either way, she will come in to clinic first thing in the morning for ICD interrogation. Differential is EMI, t wave oversensing or ventricular arrhythmias - per records and her report she is not known to have ventricular arrhythmias but she has non-ischemic cardiomyopathy with EF 20% on ace-, bb, aldosterone antagonist. Will leave message with [facility] answering service to ensure appointment in AM for device interrogation.

## 2012-10-01 ENCOUNTER — Encounter: Payer: Self-pay | Admitting: Internal Medicine

## 2012-10-01 ENCOUNTER — Ambulatory Visit (INDEPENDENT_AMBULATORY_CARE_PROVIDER_SITE_OTHER): Payer: Self-pay | Admitting: Internal Medicine

## 2012-10-01 ENCOUNTER — Ambulatory Visit (INDEPENDENT_AMBULATORY_CARE_PROVIDER_SITE_OTHER): Payer: Self-pay | Admitting: *Deleted

## 2012-10-01 DIAGNOSIS — I471 Supraventricular tachycardia: Secondary | ICD-10-CM

## 2012-10-01 DIAGNOSIS — I498 Other specified cardiac arrhythmias: Secondary | ICD-10-CM

## 2012-10-01 DIAGNOSIS — I428 Other cardiomyopathies: Secondary | ICD-10-CM

## 2012-10-01 LAB — ICD DEVICE OBSERVATION
AL THRESHOLD: 0.375 V
BATTERY VOLTAGE: 3.179 V
DEV-0020ICD: NEGATIVE
PACEART VT: 3
RV LEAD AMPLITUDE: 12.125 mv
RV LEAD THRESHOLD: 1 V
TZAT-0001FASTVT: 2
TZAT-0002FASTVT: NEGATIVE
TZAT-0002FASTVT: NEGATIVE
TZAT-0004ATACH: 15
TZAT-0004ATACH: 6
TZAT-0011ATACH: 10 ms
TZAT-0011SLOWVT: 10 ms
TZAT-0011SLOWVT: 10 ms
TZAT-0012ATACH: 150 ms
TZAT-0012FASTVT: 170 ms
TZAT-0012SLOWVT: 170 ms
TZAT-0012SLOWVT: 170 ms
TZAT-0013SLOWVT: 3
TZAT-0013SLOWVT: 3
TZAT-0018ATACH: NEGATIVE
TZAT-0018ATACH: NEGATIVE
TZAT-0018ATACH: NEGATIVE
TZAT-0018FASTVT: NEGATIVE
TZAT-0018SLOWVT: NEGATIVE
TZAT-0018SLOWVT: NEGATIVE
TZAT-0019ATACH: 6 V
TZAT-0019ATACH: 6 V
TZAT-0019ATACH: 6 V
TZAT-0019FASTVT: 8 V
TZAT-0019FASTVT: 8 V
TZAT-0019SLOWVT: 8 V
TZAT-0019SLOWVT: 8 V
TZAT-0020SLOWVT: 1.5 ms
TZAT-0020SLOWVT: 1.5 ms
TZON-0003ATACH: 350 ms
TZON-0003FASTVT: 270 ms
TZON-0003SLOWVT: 280 ms
TZON-0003VSLOWVT: 350 ms
TZON-0004SLOWVT: 44
TZON-0005SLOWVT: 12
TZST-0001ATACH: 4
TZST-0001ATACH: 5
TZST-0001FASTVT: 3
TZST-0001FASTVT: 4
TZST-0001SLOWVT: 4
TZST-0001SLOWVT: 5
TZST-0002ATACH: NEGATIVE
TZST-0002FASTVT: NEGATIVE
TZST-0002FASTVT: NEGATIVE
TZST-0003SLOWVT: 35 J
TZST-0003SLOWVT: 35 J
VENTRICULAR PACING ICD: 0.04 pct
VF: 0

## 2012-10-01 MED ORDER — METOPROLOL SUCCINATE ER 50 MG PO TB24: ORAL_TABLET | ORAL | Status: DC

## 2012-10-01 NOTE — Assessment & Plan Note (Signed)
The patient had atrial tachycardia. This is clarified by the development of the association during ventricular pacing undertaken for presumed ventricular tachycardia with a transition of rhythm from 1:1>> 2:1. Because of the modest amount of aberration, the device was not able to distinguish SVT for VT episode the morphology discriminator was inactivated.  The device was reprogrammed to try and accomplish pace termination of her atrial tachycardia as well as the patient receiving adjunctive beta blocker therapy with metoprolol succinate to try to prevent 1:1 conduction

## 2012-10-01 NOTE — Progress Notes (Signed)
Patient Care Team: Provider Default, MD as PCP - General (Orthopedic Surgery)   HPI  Sydney Shaffer is a 46 y.o. female Seen as an add-on today because of ICD discharge. She noted tachycardia palpitations prior to the onset of ICD discharge. Retrospectively, she has had these spells over the last couple of years. The abrupt in onset as well as offset and are associated with lightheadedness fatigue and shortness of breath.  Past Medical History  Diagnosis Date  . CHF (congestive heart failure)     Dx 03/2012 - dilated cardiomyopathy with EF 20-25% by echo (abnl nuc but normal coronaries 04/02/12 per cath.   . Anemia     felt to be due to heavy menstrual flow  . Chronic systolic heart failure   . Hypertensive heart disease with CHF   . Hypertension   . Dysrhythmia     Bradycardia  . 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 Shortness of breath     Past Surgical History  Procedure Date  . Tubal ligation   . Appendectomy   . Cardiac catheterization April 2013    normal coronaries  . Icd 08/13/2012    Current Outpatient Prescriptions  Medication Sig Dispense Refill  . aspirin EC 81 MG tablet Take 81 mg by mouth daily.      . carvedilol (COREG) 25 MG tablet Take 12.5 mg by mouth 2 (two) times daily with a meal.      . Doxylamine Succinate, Sleep, (SLEEP AID PO) Take 1 tablet by mouth at bedtime as needed. For sleep.      . ferrous sulfate 325 (65 FE) MG tablet Take 325 mg by mouth daily with breakfast.      . furosemide (LASIX) 40 MG tablet Take 40 mg by mouth daily.      Marland Kitchen lisinopril (PRINIVIL,ZESTRIL) 10 MG tablet Take 10 mg by mouth 2 (two) times daily.      . metoprolol succinate (TOPROL-XL) 50 MG 24 hr tablet Take 1/2 po daily  30 tablet  0  . Multiple Vitamin (MULTIVITAMIN WITH MINERALS) TABS Take 1 tablet by mouth daily.      . potassium chloride SA (K-DUR,KLOR-CON) 20 MEQ tablet Take 20 mEq by mouth daily.      Marland Kitchen spironolactone (ALDACTONE) 25 MG tablet  Take 12.5 mg by mouth daily.        No Known Allergies  Review of Systems negative except from HPI and PMH  Physical Exam Alert and NAD     Assessment and  Plan

## 2012-10-29 ENCOUNTER — Encounter: Payer: Self-pay | Admitting: Internal Medicine

## 2012-10-29 ENCOUNTER — Ambulatory Visit (INDEPENDENT_AMBULATORY_CARE_PROVIDER_SITE_OTHER): Payer: Self-pay | Admitting: Internal Medicine

## 2012-10-29 VITALS — BP 110/68 | HR 77 | Ht 67.0 in | Wt 197.0 lb

## 2012-10-29 DIAGNOSIS — I498 Other specified cardiac arrhythmias: Secondary | ICD-10-CM

## 2012-10-29 DIAGNOSIS — I420 Dilated cardiomyopathy: Secondary | ICD-10-CM

## 2012-10-29 DIAGNOSIS — I5022 Chronic systolic (congestive) heart failure: Secondary | ICD-10-CM

## 2012-10-29 DIAGNOSIS — I428 Other cardiomyopathies: Secondary | ICD-10-CM

## 2012-10-29 DIAGNOSIS — I4719 Other supraventricular tachycardia: Secondary | ICD-10-CM

## 2012-10-29 DIAGNOSIS — Z95810 Presence of automatic (implantable) cardiac defibrillator: Secondary | ICD-10-CM

## 2012-10-29 DIAGNOSIS — I471 Supraventricular tachycardia: Secondary | ICD-10-CM

## 2012-10-29 LAB — ICD DEVICE OBSERVATION
AL AMPLITUDE: 1.9 mv
BAMS-0001: 170 {beats}/min
BATTERY VOLTAGE: 3.21 V
DEV-0020ICD: NEGATIVE
RV LEAD AMPLITUDE: 10 mv
RV LEAD IMPEDENCE ICD: 361 Ohm
RV LEAD THRESHOLD: 0.75 V
TZAT-0001ATACH: 1
TZAT-0001ATACH: 2
TZAT-0001SLOWVT: 2
TZAT-0002FASTVT: NEGATIVE
TZAT-0004ATACH: 15
TZAT-0004SLOWVT: 8
TZAT-0004SLOWVT: 8
TZAT-0005SLOWVT: 88 pct
TZAT-0005SLOWVT: 91 pct
TZAT-0011ATACH: 10 ms
TZAT-0011SLOWVT: 10 ms
TZAT-0011SLOWVT: 10 ms
TZAT-0012ATACH: 150 ms
TZAT-0012FASTVT: 170 ms
TZAT-0012FASTVT: 170 ms
TZAT-0012SLOWVT: 170 ms
TZAT-0012SLOWVT: 170 ms
TZAT-0013SLOWVT: 3
TZAT-0013SLOWVT: 3
TZAT-0018ATACH: NEGATIVE
TZAT-0018SLOWVT: NEGATIVE
TZAT-0018SLOWVT: NEGATIVE
TZAT-0019ATACH: 6 V
TZAT-0019FASTVT: 8 V
TZAT-0020ATACH: 1.5 ms
TZAT-0020ATACH: 1.5 ms
TZAT-0020FASTVT: 1.5 ms
TZAT-0020FASTVT: 1.5 ms
TZON-0003ATACH: 350 ms
TZON-0003SLOWVT: 280 ms
TZON-0004SLOWVT: 44
TZON-0004VSLOWVT: 48
TZON-0005SLOWVT: 12
TZST-0001FASTVT: 3
TZST-0001FASTVT: 6
TZST-0001SLOWVT: 3
TZST-0001SLOWVT: 4
TZST-0001SLOWVT: 6
TZST-0002FASTVT: NEGATIVE
TZST-0002FASTVT: NEGATIVE
TZST-0003SLOWVT: 35 J

## 2012-10-29 MED ORDER — METOPROLOL SUCCINATE ER 50 MG PO TB24: 50.0000 mg | ORAL_TABLET | Freq: Every day | ORAL | Status: DC

## 2012-10-29 NOTE — Assessment & Plan Note (Signed)
Continue current medications. She may be a candidate for [place]

## 2012-10-29 NOTE — Progress Notes (Signed)
Patient Care Team: Provider Default, MD as PCP - General (Orthopedic Surgery)   HPI  Sydney Shaffer is a 46 y.o. female Seen as an add-on today because of ICD discharge.   She had an ICD implanted Sept 2013 for primary prevention in the setting of nonischemic cardiomyopathy. QRS was narrow.  She noted tachycardia palpitations prior to the onset of ICD discharge. Retrospectively, she has had these spells over the last couple of years. The abrupt in onset as well as offset and are associated with lightheadedness fatigue and shortness of breath.  She continues to have episodes although she is feeling somewhat better on the addition of metoprolol succinate. these recurring episodes however remained associated with a significant concern regarding the possibility of impending shock  Past Medical History  Diagnosis Date  . CHF (congestive heart failure)     Dx 03/2012 - dilated cardiomyopathy with EF 20-25% by echo (abnl nuc but normal coronaries 04/02/12 per cath.   . Anemia     felt to be due to heavy menstrual flow  . Chronic systolic heart failure   . Hypertensive heart disease with CHF   . Hypertension   . Dysrhythmia     Bradycardia  . 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 Shortness of breath     Past Surgical History  Procedure Date  . Tubal ligation   . Appendectomy   . Cardiac catheterization April 2013    normal coronaries  . Icd 08/13/2012    Current Outpatient Prescriptions  Medication Sig Dispense Refill  . aspirin EC 81 MG tablet Take 81 mg by mouth daily.      . carvedilol (COREG) 25 MG tablet Take 12.5 mg by mouth 2 (two) times daily with a meal.      . Doxylamine Succinate, Sleep, (SLEEP AID PO) Take 1 tablet by mouth at bedtime as needed. For sleep.      . ferrous sulfate 325 (65 FE) MG tablet Take 325 mg by mouth daily with breakfast.      . furosemide (LASIX) 40 MG tablet Take 40 mg by mouth daily.      Marland Kitchen lisinopril (PRINIVIL,ZESTRIL) 10  MG tablet Take 10 mg by mouth 2 (two) times daily.      . metoprolol succinate (TOPROL-XL) 50 MG 24 hr tablet Take 1/2 po daily  30 tablet  0  . Multiple Vitamin (MULTIVITAMIN WITH MINERALS) TABS Take 1 tablet by mouth daily.      . potassium chloride SA (K-DUR,KLOR-CON) 20 MEQ tablet Take 20 mEq by mouth daily.      Marland Kitchen spironolactone (ALDACTONE) 25 MG tablet Take 12.5 mg by mouth daily.        No Known Allergies  Review of Systems negative except from HPI and PMH  Physical Exam  Well developed and nourished in no acute distress HENT normal Neck supple with JVP-flat   Device pocket well healed; without hematoma or erythema  Clear Regular rate and rhythm, no murmurs or gallops Abd-soft with active BS without hepatomegaly No Clubbing cyanosis edema Skin-warm and dry;small keloid A & Oriented  Grossly normal sensory and motor function  Electrocardiogram demonstrates sinus rhythm at 77 Intervals 15/09/42 Axis is leftward at -19 Nonspecific ST-T changes inferolaterally Left ventricular hypertrophy  Assessment and  Plan

## 2012-10-29 NOTE — Assessment & Plan Note (Signed)
The patient's device was interrogated.  The information was reviewed. No changes were made in the programming.    

## 2012-10-29 NOTE — Assessment & Plan Note (Signed)
Patient continues to have episodes of atrial tachycardia that are quite fast with cycle lengths 270-350 ms. We will increase her beta blocker for right now, metoprolol succinate 25--50.  I will also arrange for her to see Dr. Johney Frame for consideration of ablation.

## 2012-10-29 NOTE — Assessment & Plan Note (Signed)
Stable class II symptoms 

## 2012-10-29 NOTE — Patient Instructions (Addendum)
Your physician wants you to follow-up in: 3 months in the device clinic with Belenda Cruise and [place]. You will receive a reminder letter in the mail two months in advance. If you don't receive a letter, please call our office to schedule the follow-up appointment.  You have been referred to Dr. Hillis Range to be evaluated for a possible Carto Ablation for Atrial Tachycardia.  Increase Metoprolol to 50mg  daily.

## 2012-11-22 ENCOUNTER — Ambulatory Visit (INDEPENDENT_AMBULATORY_CARE_PROVIDER_SITE_OTHER): Payer: Self-pay | Admitting: Internal Medicine

## 2012-11-22 ENCOUNTER — Encounter: Payer: Self-pay | Admitting: *Deleted

## 2012-11-22 ENCOUNTER — Encounter: Payer: Self-pay | Admitting: Internal Medicine

## 2012-11-22 VITALS — BP 122/78 | HR 72 | Ht 67.0 in | Wt 196.0 lb

## 2012-11-22 DIAGNOSIS — I471 Supraventricular tachycardia: Secondary | ICD-10-CM

## 2012-11-22 DIAGNOSIS — I420 Dilated cardiomyopathy: Secondary | ICD-10-CM

## 2012-11-22 DIAGNOSIS — I428 Other cardiomyopathies: Secondary | ICD-10-CM

## 2012-11-22 DIAGNOSIS — I5022 Chronic systolic (congestive) heart failure: Secondary | ICD-10-CM

## 2012-11-22 DIAGNOSIS — I498 Other specified cardiac arrhythmias: Secondary | ICD-10-CM

## 2012-11-22 LAB — ICD DEVICE OBSERVATION
AL AMPLITUDE: 2.5 mv
ATRIAL PACING ICD: 2.3 pct
BAMS-0001: 170 {beats}/min
DEV-0020ICD: NEGATIVE
FVT: 0
RV LEAD IMPEDENCE ICD: 361 Ohm
RV LEAD THRESHOLD: 0.5 V
TZAT-0001ATACH: 1
TZAT-0001SLOWVT: 1
TZAT-0001SLOWVT: 2
TZAT-0004ATACH: 15
TZAT-0004ATACH: 6
TZAT-0011ATACH: 10 ms
TZAT-0011ATACH: 10 ms
TZAT-0012ATACH: 150 ms
TZAT-0012ATACH: 150 ms
TZAT-0013SLOWVT: 3
TZAT-0013SLOWVT: 3
TZAT-0018ATACH: NEGATIVE
TZAT-0018SLOWVT: NEGATIVE
TZAT-0018SLOWVT: NEGATIVE
TZAT-0019ATACH: 6 V
TZAT-0019ATACH: 6 V
TZAT-0019FASTVT: 8 V
TZAT-0020ATACH: 1.5 ms
TZAT-0020ATACH: 1.5 ms
TZAT-0020ATACH: 1.5 ms
TZAT-0020FASTVT: 1.5 ms
TZAT-0020FASTVT: 1.5 ms
TZAT-0020SLOWVT: 1.5 ms
TZAT-0020SLOWVT: 1.5 ms
TZON-0004SLOWVT: 44
TZON-0004VSLOWVT: 48
TZON-0005SLOWVT: 12
TZST-0001ATACH: 4
TZST-0001ATACH: 5
TZST-0001FASTVT: 3
TZST-0001FASTVT: 4
TZST-0001SLOWVT: 3
TZST-0001SLOWVT: 5
TZST-0001SLOWVT: 6
TZST-0002ATACH: NEGATIVE
TZST-0002ATACH: NEGATIVE
TZST-0002FASTVT: NEGATIVE
TZST-0003SLOWVT: 35 J
TZST-0003SLOWVT: 35 J
VENTRICULAR PACING ICD: 0.03 pct
VF: 0

## 2012-11-22 NOTE — Patient Instructions (Addendum)

## 2012-11-23 ENCOUNTER — Other Ambulatory Visit: Payer: Self-pay | Admitting: *Deleted

## 2012-11-23 ENCOUNTER — Encounter: Payer: Self-pay | Admitting: Internal Medicine

## 2012-11-25 NOTE — Assessment & Plan Note (Signed)
The patient presents today for consideration of ablation for complex SVT.  She has had symptoms of SVT and documentation of initially 2:1 and subsequently 1:1 tachycardia by her ICD. This is clearly SVT and not VT.  The atrial cycle length is 310 msec. Unfortunately, she has received ICD shocks for this in the past.  She is concerned about the possiblity of recurrence though she has had no further episodes by today's interrogation. Therapeutic strategies for supraventricular tachycardia including medicine and ablation were discussed in detail with the patient today. Risk, benefits, and alternatives to EP study and radiofrequency ablation were also discussed in detail today. These risks include but are not limited to stroke, bleeding, vascular damage, tamponade, perforation, damage to the heart and other structures, AV block , worsening renal function, and death. The patient understands these risk and wishes to proceed.  We will therefore proceed with catheter ablation with Carto at the next available time.

## 2012-11-25 NOTE — Assessment & Plan Note (Signed)
Stable No change required today  

## 2012-11-25 NOTE — Assessment & Plan Note (Signed)
euvolemic Continue current medical regimen

## 2012-11-25 NOTE — Progress Notes (Signed)
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  She was recently seen by Dr Graciela Husbands (his office notes are reviewed) following ICD shock therapy.  She recently underwent ICD implantation for primary prevention 08/2012.   She reports that she has done well since her ICD was implanted.  She has unfortunately received ICD shock therapy for SVT. She reports having several episodes of abrupt onset/offset of tachypalpitations over the past few years. These episodes are typically associated with lightheadedness fatigue and shortness of breath. She reports similar palpitations prior to her recent ICD shocks.    She continues to have episodes although she is feeling somewhat better on the addition of metoprolol succinate.  These recurring episodes however remained associated with a significant concern regarding the possibility of impending shock  Today, she denies symptoms of chest pain, shortness of breath, orthopnea, PND, lower extremity edema, dizziness, presyncope, syncope, or neurologic sequela. The patient is tolerating medications without difficulties and is otherwise without complaint today.   Past Medical History  Diagnosis Date  . CHF (congestive heart failure)     Dx 03/2012 - dilated cardiomyopathy with EF 20-25% by echo (abnl nuc but normal coronaries 04/02/12 per cath.   . Anemia     felt to be due to heavy menstrual flow  . Chronic systolic heart failure   . Hypertensive heart disease with CHF   . Hypertension   . Dysrhythmia     Bradycardia  . 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 Shortness of breath    Past Surgical History  Procedure Date  . Tubal ligation   . Appendectomy   . Cardiac catheterization April 2013    normal coronaries  . Icd 08/13/2012    Current Outpatient Prescriptions  Medication Sig Dispense Refill    . aspirin EC 81 MG tablet Take 81 mg by mouth daily.      . carvedilol (COREG) 25 MG tablet Take 12.5 mg by mouth 2 (two) times daily with a meal.      . Doxylamine Succinate, Sleep, (SLEEP AID PO) Take 1 tablet by mouth at bedtime as needed. For sleep.      . ferrous sulfate 325 (65 FE) MG tablet Take 325 mg by mouth daily with breakfast.      . furosemide (LASIX) 40 MG tablet Take 40 mg by mouth daily.      Marland Kitchen lisinopril (PRINIVIL,ZESTRIL) 10 MG tablet Take 10 mg by mouth 2 (two) times daily.      . metoprolol succinate (TOPROL-XL) 50 MG 24 hr tablet Take 1 tablet (50 mg total) by mouth daily.  90 tablet  3  . Multiple Vitamin (MULTIVITAMIN WITH MINERALS) TABS Take 1 tablet by mouth daily.      . potassium chloride SA (K-DUR,KLOR-CON) 20 MEQ tablet Take 20 mEq by mouth daily.      Marland Kitchen spironolactone (ALDACTONE) 25 MG tablet Take 12.5 mg by mouth daily.        No Known Allergies  History   Social History  . Marital Status: Single    Spouse Name: [name]    Number of Children: 3  . Years of Education: [name]   Occupational History  . receptionist   . office cleaning    Social History Main Topics  . Smoking status: Never Smoker   . Smokeless tobacco: Never  Used  . Alcohol Use: 1.2 oz/week    2 Glasses of wine per week     Comment: daily  . Drug Use: No  . Sexually Active: Yes    Birth Control/ Protection: Condom   Other Topics Concern  . Not on file   Social History Narrative  . No narrative on file    Family History  Problem Relation Age of Onset  . Breast cancer Mother   . Cancer Father     ROS- All systems are reviewed and negative except as per the HPI above  Physical Exam: Filed Vitals:   11/22/12 1404  BP: 122/78  Pulse: 72  Height: 5\' 7"  (1.702 m)  Weight: 196 lb (88.905 kg)    GEN- The patient is well appearing, alert and oriented x 3 today.   Head- normocephalic, atraumatic Eyes-  Sclera clear, conjunctiva pink Ears- hearing intact Oropharynx-  clear Neck- supple  Lungs- Clear to ausculation bilaterally, normal work of breathing Heart- Regular rate and rhythm, no murmurs, rubs or gallops, PMI not laterally displaced GI- soft, NT, ND, + BS Extremities- no clubbing, cyanosis, or edema MS- no significant deformity or atrophy Skin- no rash or lesion Psych- euthymic mood, full affect Neuro- strength and sensation are intact  EKG reveals sinus rhythm 72 bpm, PR 146 msec, QRS 94 msec, nonspecific ST/ T changes ICD interrogation is reviewed today, see paceart  Assessment and Plan:

## 2012-12-14 ENCOUNTER — Encounter: Payer: Self-pay | Admitting: Internal Medicine

## 2012-12-24 ENCOUNTER — Encounter (HOSPITAL_COMMUNITY): Payer: Self-pay | Admitting: Pharmacy Technician

## 2012-12-31 ENCOUNTER — Other Ambulatory Visit: Payer: Self-pay

## 2013-01-06 ENCOUNTER — Telehealth: Payer: Self-pay | Admitting: Internal Medicine

## 2013-01-06 NOTE — Telephone Encounter (Signed)
I do not have paperwork for this patient unless she is bringing to the office today. She is scheduled for an ablation with Dr. Johney Frame tomorrow. Will follow up with patient if needed when she comes for lab work.

## 2013-01-06 NOTE — Telephone Encounter (Signed)
New problem:  Upcoming procedure on 1/31 need paperwork for filing  & work   Will be by the office today to do lab work  .

## 2013-01-07 ENCOUNTER — Encounter (HOSPITAL_COMMUNITY): Payer: Self-pay | Admitting: Anesthesiology

## 2013-01-07 ENCOUNTER — Encounter (HOSPITAL_COMMUNITY): Payer: Self-pay | Admitting: Family Medicine

## 2013-01-07 ENCOUNTER — Ambulatory Visit (HOSPITAL_COMMUNITY)
Admission: RE | Admit: 2013-01-07 | Discharge: 2013-01-08 | Disposition: A | Discharge status: Home / Self Care | Payer: Self-pay | Source: Ambulatory Visit | Attending: Internal Medicine | Admitting: Internal Medicine

## 2013-01-07 ENCOUNTER — Ambulatory Visit (HOSPITAL_COMMUNITY): Payer: Self-pay | Admitting: Anesthesiology

## 2013-01-07 ENCOUNTER — Encounter (HOSPITAL_COMMUNITY)
Admission: RE | Disposition: A | Discharge status: Home / Self Care | Payer: Self-pay | Source: Ambulatory Visit | Attending: Internal Medicine

## 2013-01-07 DIAGNOSIS — I498 Other specified cardiac arrhythmias: Secondary | ICD-10-CM | POA: Insufficient documentation

## 2013-01-07 DIAGNOSIS — I428 Other cardiomyopathies: Secondary | ICD-10-CM | POA: Insufficient documentation

## 2013-01-07 DIAGNOSIS — I110 Hypertensive heart disease with heart failure: Secondary | ICD-10-CM

## 2013-01-07 DIAGNOSIS — Z95810 Presence of automatic (implantable) cardiac defibrillator: Secondary | ICD-10-CM

## 2013-01-07 DIAGNOSIS — I10 Essential (primary) hypertension: Secondary | ICD-10-CM | POA: Insufficient documentation

## 2013-01-07 DIAGNOSIS — R0600 Dyspnea, unspecified: Secondary | ICD-10-CM

## 2013-01-07 DIAGNOSIS — I420 Dilated cardiomyopathy: Secondary | ICD-10-CM | POA: Diagnosis present

## 2013-01-07 DIAGNOSIS — I509 Heart failure, unspecified: Secondary | ICD-10-CM | POA: Insufficient documentation

## 2013-01-07 DIAGNOSIS — I471 Supraventricular tachycardia: Secondary | ICD-10-CM | POA: Diagnosis present

## 2013-01-07 DIAGNOSIS — D649 Anemia, unspecified: Secondary | ICD-10-CM

## 2013-01-07 DIAGNOSIS — I5022 Chronic systolic (congestive) heart failure: Secondary | ICD-10-CM | POA: Insufficient documentation

## 2013-01-07 HISTORY — PX: SUPRAVENTRICULAR TACHYCARDIA ABLATION: SHX5492

## 2013-01-07 HISTORY — PX: OTHER SURGICAL HISTORY: SHX169

## 2013-01-07 LAB — PROTIME-INR
INR: 1.02 (ref 0.00–1.49)
Prothrombin Time: 13.3 seconds (ref 11.6–15.2)

## 2013-01-07 LAB — BASIC METABOLIC PANEL
CO2: 21 mEq/L (ref 19–32)
Calcium: 8.7 mg/dL (ref 8.4–10.5)
Chloride: 106 mEq/L (ref 96–112)
Sodium: 135 mEq/L (ref 135–145)

## 2013-01-07 LAB — CBC
Platelets: 238 10*3/uL (ref 150–400)
RBC: 4.1 MIL/uL (ref 3.87–5.11)
WBC: 6 10*3/uL (ref 4.0–10.5)

## 2013-01-07 SURGERY — SUPRAVENTRICULAR TACHYCARDIA ABLATION
Anesthesia: Monitor Anesthesia Care

## 2013-01-07 MED ORDER — FENTANYL CITRATE 0.05 MG/ML IJ SOLN
INTRAMUSCULAR | Status: DC | PRN
  Administered 2013-01-07: 50 ug via INTRAVENOUS
  Administered 2013-01-07: 25 ug via INTRAVENOUS
  Administered 2013-01-07: 50 ug via INTRAVENOUS
  Administered 2013-01-07 (×2): 25 ug via INTRAVENOUS

## 2013-01-07 MED ORDER — PROPOFOL 10 MG/ML IV BOLUS
INTRAVENOUS | Status: DC | PRN
  Administered 2013-01-07: 30 mg via INTRAVENOUS
  Administered 2013-01-07: 20 mg via INTRAVENOUS

## 2013-01-07 MED ORDER — SODIUM CHLORIDE 0.9 % IJ SOLN
3.0000 mL | Freq: Two times a day (BID) | INTRAMUSCULAR | Status: DC
  Administered 2013-01-07: 3 mL via INTRAVENOUS

## 2013-01-07 MED ORDER — ASPIRIN EC 81 MG PO TBEC
81.0000 mg | DELAYED_RELEASE_TABLET | Freq: Every day | ORAL | Status: DC
  Administered 2013-01-07 – 2013-01-08 (×2): 81 mg via ORAL
  Filled 2013-01-07 (×2): qty 1

## 2013-01-07 MED ORDER — MIDAZOLAM HCL 5 MG/5ML IJ SOLN
INTRAMUSCULAR | Status: DC | PRN
  Administered 2013-01-07: 2 mg via INTRAVENOUS

## 2013-01-07 MED ORDER — SODIUM CHLORIDE 0.9 % IJ SOLN: 3.0000 mL | INTRAMUSCULAR | Status: DC | PRN

## 2013-01-07 MED ORDER — HYDROMORPHONE HCL PF 1 MG/ML IJ SOLN: 0.2500 mg | INTRAMUSCULAR | Status: DC | PRN

## 2013-01-07 MED ORDER — SODIUM CHLORIDE 0.9 % IV SOLN
1000.0000 ug | INTRAVENOUS | Status: DC | PRN
  Administered 2013-01-07: 4 ug/min via INTRAVENOUS

## 2013-01-07 MED ORDER — HYDROCODONE-ACETAMINOPHEN 5-325 MG PO TABS
1.0000 | ORAL_TABLET | ORAL | Status: DC | PRN
  Administered 2013-01-07 (×2): 2 via ORAL
  Filled 2013-01-07 (×2): qty 2

## 2013-01-07 MED ORDER — SODIUM CHLORIDE 0.9 % IV SOLN: 250.0000 mL | INTRAVENOUS | Status: DC | PRN

## 2013-01-07 MED ORDER — FERROUS SULFATE 325 (65 FE) MG PO TABS
325.0000 mg | ORAL_TABLET | Freq: Every day | ORAL | Status: DC
  Administered 2013-01-08: 325 mg via ORAL
  Filled 2013-01-07 (×2): qty 1

## 2013-01-07 MED ORDER — LISINOPRIL 10 MG PO TABS
10.0000 mg | ORAL_TABLET | Freq: Two times a day (BID) | ORAL | Status: DC
  Administered 2013-01-07 – 2013-01-08 (×2): 10 mg via ORAL
  Filled 2013-01-07 (×3): qty 1

## 2013-01-07 MED ORDER — METOPROLOL SUCCINATE ER 50 MG PO TB24
50.0000 mg | ORAL_TABLET | Freq: Every day | ORAL | Status: DC
  Administered 2013-01-08: 50 mg via ORAL
  Filled 2013-01-07: qty 1

## 2013-01-07 MED ORDER — SPIRONOLACTONE 12.5 MG HALF TABLET
12.5000 mg | ORAL_TABLET | Freq: Every day | ORAL | Status: DC
  Administered 2013-01-07 – 2013-01-08 (×2): 12.5 mg via ORAL
  Filled 2013-01-07 (×2): qty 1

## 2013-01-07 MED ORDER — ACETAMINOPHEN 325 MG PO TABS: 650.0000 mg | ORAL_TABLET | ORAL | Status: DC | PRN

## 2013-01-07 MED ORDER — HYDROXYUREA 500 MG PO CAPS
ORAL_CAPSULE | ORAL | Status: AC
  Filled 2013-01-07: qty 1

## 2013-01-07 MED ORDER — LIDOCAINE HCL (CARDIAC) 20 MG/ML IV SOLN
INTRAVENOUS | Status: DC | PRN
  Administered 2013-01-07: 10 mg via INTRAVENOUS
  Administered 2013-01-07: 40 mg via INTRAVENOUS

## 2013-01-07 MED ORDER — FUROSEMIDE 40 MG PO TABS
40.0000 mg | ORAL_TABLET | Freq: Every day | ORAL | Status: DC
  Administered 2013-01-07 – 2013-01-08 (×2): 40 mg via ORAL
  Filled 2013-01-07 (×2): qty 1

## 2013-01-07 MED ORDER — INFLUENZA VIRUS VACC SPLIT PF IM SUSP
0.5000 mL | INTRAMUSCULAR | Status: AC
  Administered 2013-01-08: 0.5 mL via INTRAMUSCULAR
  Filled 2013-01-07: qty 0.5

## 2013-01-07 MED ORDER — PROPOFOL 10 MG/ML IV EMUL
INTRAVENOUS | Status: DC | PRN
  Administered 2013-01-07: 100 ug/kg/min via INTRAVENOUS

## 2013-01-07 MED ORDER — LACTATED RINGERS IV SOLN
INTRAVENOUS | Status: DC | PRN
  Administered 2013-01-07: 13:00:00 via INTRAVENOUS

## 2013-01-07 MED ORDER — CARVEDILOL 12.5 MG PO TABS
12.5000 mg | ORAL_TABLET | Freq: Two times a day (BID) | ORAL | Status: DC
  Administered 2013-01-07 – 2013-01-08 (×2): 12.5 mg via ORAL
  Filled 2013-01-07 (×5): qty 1

## 2013-01-07 MED ORDER — ONDANSETRON HCL 4 MG/2ML IJ SOLN: 4.0000 mg | Freq: Once | INTRAMUSCULAR | Status: AC | PRN

## 2013-01-07 MED ORDER — DOCUSATE SODIUM 100 MG PO CAPS
100.0000 mg | ORAL_CAPSULE | Freq: Two times a day (BID) | ORAL | Status: DC | PRN
  Administered 2013-01-07: 100 mg via ORAL
  Filled 2013-01-07 (×2): qty 1

## 2013-01-07 MED ORDER — POTASSIUM CHLORIDE CRYS ER 20 MEQ PO TBCR
20.0000 meq | EXTENDED_RELEASE_TABLET | Freq: Every day | ORAL | Status: DC
  Administered 2013-01-07 – 2013-01-08 (×2): 20 meq via ORAL
  Filled 2013-01-07 (×2): qty 1

## 2013-01-07 MED ORDER — BUPIVACAINE HCL (PF) 0.25 % IJ SOLN
INTRAMUSCULAR | Status: AC
  Filled 2013-01-07: qty 30

## 2013-01-07 MED ORDER — ONDANSETRON HCL 4 MG/2ML IJ SOLN: 4.0000 mg | Freq: Four times a day (QID) | INTRAMUSCULAR | Status: DC | PRN

## 2013-01-07 MED ORDER — ADULT MULTIVITAMIN W/MINERALS CH
1.0000 | ORAL_TABLET | Freq: Every day | ORAL | Status: DC
  Administered 2013-01-07 – 2013-01-08 (×2): 1 via ORAL
  Filled 2013-01-07 (×2): qty 1

## 2013-01-07 NOTE — H&P (Signed)
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 She was recently seen by Dr Sydney Shaffer (his office notes are reviewed) following ICD shock therapy. She recently underwent ICD implantation for primary prevention 08/2012.  She reports that she has done well since her ICD was implanted. She has unfortunately received ICD shock therapy for SVT.  She reports having several episodes of abrupt onset/offset of tachypalpitations over the past few years. These episodes are typically associated with lightheadedness fatigue and shortness of breath. She reports similar palpitations prior to her recent ICD shocks.  She continues to have episodes although she is feeling somewhat better on the addition of metoprolol succinate. These recurring episodes however remained associated with a significant concern regarding the possibility of impending shock  Today, she denies symptoms of chest pain, shortness of breath, orthopnea, PND, lower extremity edema, dizziness, presyncope, syncope, or neurologic sequela. The patient is tolerating medications without difficulties and is otherwise without complaint today.    Past Medical History   Diagnosis  Date   .  CHF (congestive heart failure)      Dx 03/2012 - dilated cardiomyopathy with EF 20-25% by echo (abnl nuc but normal coronaries 04/02/12 per cath.   .  Anemia      felt to be due to heavy menstrual flow   .  Chronic systolic heart failure    .  Hypertensive heart disease with CHF    .  Hypertension    .  Dysrhythmia      Bradycardia   .  ICD (implantable cardiac defibrillator) in place  08/13/2012   .  Shortness of breath     Past Surgical History   Procedure  Date   .  Tubal ligation    .  Appendectomy    .  Cardiac catheterization  April 2013     normal coronaries   .  Icd  08/13/2012    Current Outpatient Prescriptions    Medication  Sig  Dispense  Refill   .  aspirin EC 81 MG tablet  Take 81 mg by mouth daily.     .  carvedilol (COREG) 25 MG tablet  Take 12.5 mg by mouth 2 (two) times daily with a meal.     .  Doxylamine Succinate, Sleep, (SLEEP AID PO)  Take 1 tablet by mouth at bedtime as needed. For sleep.     .  ferrous sulfate 325 (65 FE) MG tablet  Take 325 mg by mouth daily with breakfast.     .  furosemide (LASIX) 40 MG tablet  Take 40 mg by mouth daily.     Marland Kitchen  lisinopril (PRINIVIL,ZESTRIL) 10 MG tablet  Take 10 mg by mouth 2 (two) times daily.     .  metoprolol succinate (TOPROL-XL) 50 MG 24 hr tablet  Take 1 tablet (50 mg total) by mouth daily.  90 tablet  3   .  Multiple Vitamin (MULTIVITAMIN WITH MINERALS) TABS  Take 1 tablet by mouth daily.     .  potassium chloride SA (K-DUR,KLOR-CON) 20 MEQ tablet  Take 20 mEq by mouth daily.     Marland Kitchen  spironolactone (ALDACTONE) 25 MG tablet  Take 12.5 mg by mouth daily.      No Known Allergies  History    Social History   .  Marital Status:  Single  Spouse Name:  [name]     Number of Children:  3   .  Years of Education:  [name]    Occupational History   .  receptionist    .  office cleaning     Social History Main Topics   .  Smoking status:  Never Smoker   .  Smokeless tobacco:  Never Used   .  Alcohol Use:  1.2 oz/week     2 Glasses of wine per week      Comment: daily   .  Drug Use:  No   .  Sexually Active:  Yes     Birth Control/ Protection:  Condom    Other Topics  Concern   .  Not on file    Social History Narrative   .  No narrative on file    Family History   Problem  Relation  Age of Onset   .  Breast cancer  Mother    .  Cancer  Father     ROS- All systems are reviewed and negative except as per the HPI above  Physical Exam:  Filed Vitals:    11/22/12 1404   BP:  122/78   Pulse:  72   Height:  5\' 7"  (1.702 m)   Weight:  196 lb (88.905 kg)    GEN- The patient is well appearing, alert and oriented x 3 today.  Head-  normocephalic, atraumatic  Eyes- Sclera clear, conjunctiva pink  Ears- hearing intact  Oropharynx- clear  Neck- supple  Lungs- Clear to ausculation bilaterally, normal work of breathing  Heart- Regular rate and rhythm, no murmurs, rubs or gallops, PMI not laterally displaced  GI- soft, NT, ND, + BS  Extremities- no clubbing, cyanosis, or edema  MS- no significant deformity or atrophy  Skin- no rash or lesion  Psych- euthymic mood, full affect  Neuro- strength and sensation are intact    Assessment and Plan:   Atrial tachycardia  The patient presents today for ablation for complex SVT. She has had symptoms of SVT and documentation of initially 2:1 and subsequently 1:1 tachycardia by her ICD. This is clearly SVT and not VT. The atrial cycle length is 310 msec. Unfortunately, she has received ICD shocks for this in the past. She is concerned about the possiblity of recurrence though she has had no further episodes by today's interrogation.  Therapeutic strategies for supraventricular tachycardia including medicine and ablation were discussed in detail with the patient today. Risk, benefits, and alternatives to EP study and radiofrequency ablation were also discussed in detail today. These risks include but are not limited to stroke, bleeding, vascular damage, tamponade, perforation, damage to the heart and other structures, AV block , worsening renal function, and death. The patient understands these risk and wishes to proceed. We will therefore proceed with catheter ablation with Carto.

## 2013-01-07 NOTE — Anesthesia Preprocedure Evaluation (Addendum)
Anesthesia Evaluation  Patient identified by MRN, date of birth, ID band Patient awake    Reviewed: Allergy & Precautions, H&P , NPO status , Patient's Chart, lab work & pertinent test results  Airway Mallampati: I TM Distance: >3 FB Neck ROM: full    Dental   Pulmonary          Cardiovascular hypertension, +CHF + dysrhythmias + Cardiac Defibrillator Rhythm:regular Rate:Normal     Neuro/Psych    GI/Hepatic   Endo/Other    Renal/GU      Musculoskeletal   Abdominal   Peds  Hematology   Anesthesia Other Findings   Reproductive/Obstetrics                          Anesthesia Physical Anesthesia Plan  ASA: IV  Anesthesia Plan: MAC   Post-op Pain Management:    Induction: Intravenous  Airway Management Planned: Simple Face Mask  Additional Equipment:   Intra-op Plan:   Post-operative Plan:   Informed Consent: I have reviewed the patients History and Physical, chart, labs and discussed the procedure including the risks, benefits and alternatives for the proposed anesthesia with the patient or authorized representative who has indicated his/her understanding and acceptance.     Plan Discussed with: CRNA, Anesthesiologist and Surgeon  Anesthesia Plan Comments:       Anesthesia Quick Evaluation

## 2013-01-07 NOTE — Transfer of Care (Signed)
Immediate Anesthesia Transfer of Care Note  Patient: Sydney Shaffer  Procedure(s) Performed: Procedure(s) (LRB) with comments: SUPRAVENTRICULAR TACHYCARDIA ABLATION ([name])  Patient Location: PACU  Anesthesia Type:MAC  Level of Consciousness: awake, alert  and oriented  Airway & Oxygen Therapy: Patient Spontanous Breathing  Post-op Assessment: Report given to PACU RN  Post vital signs: stable  Complications: No apparent anesthesia complications

## 2013-01-07 NOTE — Preoperative (Signed)
Beta Blockers   Reason not to administer Beta Blockers:Not Applicable 

## 2013-01-07 NOTE — Anesthesia Postprocedure Evaluation (Signed)
  Anesthesia Post-op Note  Patient: Sydney Shaffer  Procedure(s) Performed: Procedure(s) (LRB) with comments: SUPRAVENTRICULAR TACHYCARDIA ABLATION ([name])  Patient Location: PACU  Anesthesia Type:MAC  Level of Consciousness: awake, oriented and patient cooperative  Airway and Oxygen Therapy: Patient Spontanous Breathing  Post-op Pain: none  Post-op Assessment: Post-op Vital signs reviewed, Patient's Cardiovascular Status Stable, Respiratory Function Stable, Patent Airway, No signs of Nausea or vomiting and Pain level controlled  Post-op Vital Signs: stable  Complications: No apparent anesthesia complications

## 2013-01-08 DIAGNOSIS — I471 Supraventricular tachycardia: Secondary | ICD-10-CM

## 2013-01-08 NOTE — Discharge Summary (Signed)
CARDIOLOGY DISCHARGE SUMMARY    Patient ID: Sydney Shaffer,  MRN: [id number], DOB/AGE: Mar 25, 1966 47 y.o.  Admit date: 01/07/2013 Discharge date: 01/08/2013  Primary Cardiologist: Peter Swaziland, MD Primary EP: Berton Mount, MD  Primary Discharge Diagnosis:  1. Atrial tachycardia s/p EPS +RF ablation 2. AVNRT s/p EPS +RF ablation  Secondary Discharge Diagnoses:  1. Nonischemic CM, has ICD in place 2. Chronic systolic HF 3. HTN 4. Anemia  Procedures This Admission:  PROCEDURES:  1. Comprehensive EP study.  2. Coronary sinus pacing and recording.  3. Three-dimensional mapping of supraventricular tachycardia with 2 discrete foci mapped.  4. Radiofrequency ablation of supraventricular tachycardia with 2  discrete foci mapped.  5. ICD interrogation and reprogramming.  6. Isoproterenol infusion. CONCLUSIONS:  1. Sinus rhythm upon presentation with easily inducible and incessant classic AV nodal reentrant tachycardia.  2. Successful slow pathway ablation.  3. Easily inducible atrial tachycardia arising from the anteroseptal right atrium 12 mm above the His, successfully ablated in this location.  4. No inducible arrhythmias following ablation.  5. No early apparent complications.  History and Hospital Course:  Sydney Shaffer is a pleasant 47 year old female with a longstanding nonischemic cardiomyopathy status post prior implantation of a Medtronic Protecta XT ICD by Dr. Graciela Husbands who has recently received an ICD therapy inappropriately for supraventricular tachycardia. She has failed medical therapy, and therefore presented yesterday for EP study and  radiofrequency ablation. Please see procedure conclusions/details as outlined above. Sydney Shaffer tolerated this procedure well without any immediate complication. She remains hemodynamically stable and afebrile. Her groin site is intact without significant bleeding or hematoma. She has been given discharge instructions including wound care and  activity restrictions. She will follow-up with Dr. Johney Frame in clinic in 4 weeks. She will return to work in one week on Monday, Feb 10th. There were no changes made to her medications. She has been seen, examined and deemed stable for discharge today by Dr. Hillis Range.  Discharge Vitals: Blood pressure 101/64, pulse 67, temperature 98.4 F (36.9 C), temperature source Oral, resp. rate 20, height 5' 7.5" (1.715 m), weight 198 lb 4.8 oz (89.948 kg), last menstrual period 12/13/2012, SpO2 97.00%.   Labs: Lab Results  Component Value Date   WBC 6.0 01/07/2013   HGB 11.0* 01/07/2013   HCT 33.3* 01/07/2013   MCV 81.2 01/07/2013   PLT 238 01/07/2013     Lab 01/07/13 1110  NA 135  K 3.7  CL 106  CO2 21  BUN 16  CREATININE 0.68  CALCIUM 8.7  PROT --  BILITOT --  ALKPHOS --  ALT --  AST --  GLUCOSE 97    Basename 01/07/13 1110  INR 1.02    Disposition:  The patient is being discharged in stable condition.  Follow-up:     Follow-up Information    Follow up with Hillis Range, MD. On 02/07/2013. (At 10:15 AM)    Contact information:   [address] [place] [address] [phone]      Follow up with [facility]. On 01/27/2013. (At 10:00 AM)    Contact information:   [address] [place] [address] [phone]       Discharge Medications:    Medication List     As of 01/08/2013 11:24 AM    TAKE these medications         aspirin EC 81 MG tablet   Take 81 mg by mouth daily.  carvedilol 25 MG tablet   Commonly known as: COREG   Take 12.5 mg by mouth 2 (two) times daily with a meal.      ferrous sulfate 325 (65 FE) MG tablet   Take 325 mg by mouth daily with breakfast.      furosemide 40 MG tablet   Commonly known as: LASIX   Take 40 mg by mouth daily.      lisinopril 10 MG tablet   Commonly known as: PRINIVIL,ZESTRIL   Take 10 mg by mouth 2 (two) times daily.      metoprolol succinate 50 MG 24 hr tablet    Commonly known as: TOPROL-XL   Take 50 mg by mouth daily. Take with or immediately following a meal.      multivitamin with minerals Tabs   Take 1 tablet by mouth daily.      potassium chloride SA 20 MEQ tablet   Commonly known as: K-DUR,KLOR-CON   Take 20 mEq by mouth daily.      SLEEP AID PO   Take 1 tablet by mouth at bedtime as needed. For sleep.      spironolactone 25 MG tablet   Commonly known as: ALDACTONE   Take 12.5 mg by mouth daily.       Duration of Discharge Encounter: Greater than 30 minutes including physician time.  Limmie Patricia, PA-C 01/08/2013, 11:24 AM   Hillis Range, MD

## 2013-01-08 NOTE — Op Note (Signed)
NAMEBELMA, DYCHES NO.:  [id number]  MEDICAL RECORD NO.:  [id number]  LOCATION:  3W10C                        FACILITY:  [facility]  PHYSICIAN:  Hillis Range, MD       DATE OF BIRTH:  09-06-1966  DATE OF PROCEDURE: DATE OF DISCHARGE:                              OPERATIVE REPORT   PREPROCEDURE DIAGNOSIS:  Supraventricular tachycardia.  POSTPROCEDURE DIAGNOSES: 1. Atrial tachycardia arising from the anteroseptal right atrium. 2. Atrioventricular nodal reentrant tachycardia.  PROCEDURES: 1. Comprehensive EP study. 2. Coronary sinus pacing and recording. 3. Three-dimensional mapping of supraventricular tachycardia with 2     discrete foci mapped. 4. Radiofrequency ablation of supraventricular tachycardia with 2     discrete foci mapped. 5. ICD interrogation and reprogramming. 6. Isoproterenol infusion.  INTRODUCTION:  Ms. Hrivnak is a pleasant 47 year old female with a longstanding cardiomyopathy status post prior implantation of a Medtronic Protecta XT ICD by Dr. Graciela Husbands who has recently received an ICD therapy inappropriately for supraventricular tachycardia.  She has failed medical therapy, and therefore presents today for EP study and radiofrequency ablation.  DESCRIPTION OF PROCEDURE:  Informed written consent was obtained, and the patient was brought to the electrophysiology lab in the fasting state.  She was adequately sedated with intravenous medications as outlined in the anesthesia report.  The patient's right neck and groin were prepped and draped in the usual sterile fashion by the EP lab staff.  Using a percutaneous Seldinger technique, one 6-French hemostasis sheath was placed into the right internal jugular vein.  A 6- French curved Hexapolar Damato catheter was introduced through the right internal jugular vein and advanced into the coronary sinus for recording and pacing from this location.  Using a percutaneous Seldinger technique, two  6-French and one 8-French hemostasis sheaths were placed into the right common femoral vein.  Two 6-French quadripolar Josephson catheters were introduced through the right common femoral vein and advanced into the His bundle and right ventricular apex positions respectively.  The patient's ICD was interrogated, and she was confirmed to have multiple episodes of supraventricular tachycardia with a cycle length of 290 milliseconds with predominantly 1:1 AV conduction, but occasionally 2:1 AV conduction, suggestive of likely an atrial tachycardia.  The ICD detections were programed off for the procedure today.  The patient's PR interval measured 140 milliseconds with a QRS duration of 106 milliseconds and a QT interval of 456 milliseconds.  Her RR interval measured 962 milliseconds.  Her AH interval measured 77 milliseconds with an HV interval of 51 milliseconds.  Ventricular pacing was performed, which revealed midline concentric VA conduction.  The patient was observed to have a nearly incessant tachycardia with a cycle length of 590 milliseconds.  This was a 1:1 tachycardia with a VA time measuring 0 milliseconds with the earliest retrograde activation recorded from the His electrogram.  This occurred almost incessantly with ventricular pacing.  Ventricular pacing was performed during this tachycardia, which revealed a VAV response.  This tachycardia was felt to likely represent classic AV nodal reentrant tachycardia.  Ventricular pacing was continued down to a retrograde VA block cycle length of 300 milliseconds with intermittent sustained tachycardia observed. Ventricular extra stimulus testing was  performed, which revealed decremental VA conduction with a retrograde jump and easily inducible tachycardia as previously described.  The ventricular ERP was 500/270 milliseconds.  Rapid atrial pacing was performed, which revealed PR greater than RR with no tachycardia induced.  Atrial extra  stimulus testing was performed, which revealed decremental AV conduction with no AH jumps, echo beats, or tachycardias observed.  There was intermittent right bundle-branch block morphology, QRS conduction observed with rapid atrial pacing.  The atrial ERP was 500/270 milliseconds.  Isoproterenol was then infused at 4 mcg/minute initially with an adequate acceleration in heart rate observed.  Isoproterenol was then decreased to 2 mcg/minute for EP testing.  Rapid atrial pacing was performed, which revealed PR greater than RR with intermittent right bundle-branch aberrant conduction observed.  Rapid atrial pacing was continued down to a cycle length of 210 milliseconds with no arrhythmias observed.  Atrial extra stimulus testing was then performed during 2 mcg/minute of isoproterenol infusion.  There was no AH jump or echo beats observed though the patient did have onset of tachycardia with 450/210 milliseconds.  This was a two-to-one tachycardia with a coronary sinus activation sequence, which was proximal to distal and suggestive of right atrial tachycardia.  I therefore elected to perform three- dimensional mapping of this tachycardia.  A [employer]-Webster EZ Steer 4 mm 7-French ablation catheter was introduced through the right common femoral vein and advanced into the right atrium.  Three-dimensional electroanatomical mapping was performed using the [employer]. This demonstrated that the earliest atrial activation arose from the anteroseptal region of the right atrium 12 mm above the His electrogram. Extensive mapping was performed in this region.  The local activation proceeded the surface QRS in this location by 28 milliseconds.  A strong QS depolarization was observed from the unipolar electrode. Radiofrequency current was delivered, and the tachycardia immediately terminated.  Several additional radiofrequency applications were delivered with a target temperature of 60  degrees at 50 watts. Catheters stability was difficult; however, and lesions were quite short in duration (predominantly 15-20 seconds).  Isoproterenol was continued, and additional atrial pacing was performed with return of tachycardia. Additional mapping was performed, and in the same region at which the tachycardia appeared to arise, similar findings were found, and this was clearly the earliest atrial activation site.  A single radiofrequency application was therefore delivered in this area for 60 seconds.  The tachycardia quickly terminated with RF.  Atrial extra stimulus testing was again performed during isoproterenol infusion with tachycardia no longer rendered.  Rapid atrial pacing was performed, which revealed PR greater than RR, but no sustained arrhythmias.  The patient continued to have episodes of classic AV nodal reentrant tachycardia and I therefore elected to then turn my attention to the slow pathway.  Mapping of Koch's triangle was performed and was felt to be a standard Koch's triangle.  A series of 3 radiofrequency applications were delivered between sites 9 in 7 in Koch's triangle.  Accelerated junctional rhythm was observed with intact VA conduction during the third radiofrequency application.  Two single dropped beats were observed and RF was discontinued.  Ventricular extra stimulus testing was again performed, which revealed a single retrograde jump, but no echo beats and tachycardia no longer inducible.  The ventricular ERP was 450/230 milliseconds.  Rapid atrial pacing was again performed, which revealed no evidence of PR greater than RR, and the AV Wenckebach cycle length was now 330 milliseconds.  Rapid atrial pacing was continued down to a cycle length of 200  milliseconds with no arrhythmias observed.  Atrial extra stimulus testing was again performed several times, which revealed no AH jumps, echo beats, or tachycardias observed.  Atrial tachycardia was no  longer inducible, both on and off of isoproterenol.  Following ablation today, the AH interval measured 45 milliseconds with an HV interval of 56 milliseconds.  The procedure was therefore considered completed.  All catheters were removed, and the sheaths were aspirated and flushed.  The sheaths were removed, and hemostasis was assured. There were no early apparent complications.  The patient's defibrillator was again interrogated following the case, and atrial and ventricular lead impedance sensing and pacing parameters were confirmed to be stable.  Tachycardia detections and therapies were then turned back on. There were no early apparent complications.  CONCLUSIONS: 1. Sinus rhythm upon presentation with easily inducible and incessant     classic AV nodal reentrant tachycardia. 2. Successful slow pathway ablation. 3. Easily inducible atrial tachycardia arising from the anteroseptal     right atrium 12 mm above the His,     successfully ablated in this location. 4. No inducible arrhythmias following ablation. 5. No early apparent complications.     Hillis Range, MD     JA/MEDQ  D:  01/07/2013  T:  01/08/2013  Job:  [id number]  cc:   Duke Salvia, MD, [facility]

## 2013-01-08 NOTE — Progress Notes (Signed)
   SUBJECTIVE: The patient is doing well today.  At this time, she denies chest pain, shortness of breath, or any new concerns.     Marland Kitchen aspirin EC  81 mg Oral Daily  . carvedilol  12.5 mg Oral BID WC  . ferrous sulfate  325 mg Oral Q breakfast  . furosemide  40 mg Oral Daily  . influenza  inactive virus vaccine  0.5 mL Intramuscular Tomorrow-1000  . lisinopril  10 mg Oral BID  . metoprolol succinate  50 mg Oral Daily  . multivitamin with minerals  1 tablet Oral Daily  . potassium chloride SA  20 mEq Oral Daily  . sodium chloride  3 mL Intravenous Q12H  . spironolactone  12.5 mg Oral Daily      OBJECTIVE: Physical Exam: Filed Vitals:   01/07/13 1830 01/07/13 2100 01/08/13 0500 01/08/13 0857  BP: 126/66 118/72 104/66 94/60  Pulse: 84 82 60 60  Temp:  98.6 F (37 C) 98.4 F (36.9 C)   TempSrc:  Oral Oral   Resp:  20 20   Height:      Weight:   198 lb 4.8 oz (89.948 kg)   SpO2:  98% 97%     Intake/Output Summary (Last 24 hours) at 01/08/13 0913 Last data filed at 01/07/13 1456  Gross per 24 hour  Intake    600 ml  Output      0 ml  Net    600 ml    Telemetry reveals sinus rhythm  GEN- The patient is well appearing, alert and oriented x 3 today.   Head- normocephalic, atraumatic Eyes-  Sclera clear, conjunctiva pink Ears- hearing intact Oropharynx- clear Neck- supple, no JVP Lymph- no cervical lymphadenopathy Lungs- Clear to ausculation bilaterally, normal work of breathing Heart- Regular rate and rhythm, no murmurs, rubs or gallops, PMI not laterally displaced GI- soft, NT, ND, + BS Extremities- no clubbing, cyanosis, or edema, no hematoma/bruit  LABS: Basic Metabolic Panel:  Basename 01/07/13 1110  NA 135  K 3.7  CL 106  CO2 21  GLUCOSE 97  BUN 16  CREATININE 0.68  CALCIUM 8.7  MG --  PHOS --   Liver Function Tests: No results found for this basename: AST:2,ALT:2,ALKPHOS:2,BILITOT:2,PROT:2,ALBUMIN:2 in the last 72 hours No results found for this  basename: LIPASE:2,AMYLASE:2 in the last 72 hours CBC:  Basename 01/07/13 1110  WBC 6.0  NEUTROABS --  HGB 11.0*  HCT 33.3*  MCV 81.2  PLT 238   ASSESSMENT AND PLAN:  Active Problems:  Nonischemic dilated cardiomyopathy  Atrial tachycardia  1. Atrial tachycardia/ AVNRT Doing well s/p ablation Continue current medicines at discharge Follow-up with me in 4weeks    Hillis Range, MD 01/08/2013 9:13 AM

## 2013-01-28 ENCOUNTER — Encounter: Payer: Self-pay | Admitting: Internal Medicine

## 2013-01-28 ENCOUNTER — Ambulatory Visit (INDEPENDENT_AMBULATORY_CARE_PROVIDER_SITE_OTHER): Payer: Self-pay | Admitting: Cardiology

## 2013-01-28 ENCOUNTER — Encounter: Payer: Self-pay | Admitting: Cardiology

## 2013-01-28 DIAGNOSIS — I5022 Chronic systolic (congestive) heart failure: Secondary | ICD-10-CM

## 2013-01-28 LAB — ICD DEVICE OBSERVATION
AL AMPLITUDE: 2 mv
AL IMPEDENCE ICD: 361 Ohm
BAMS-0001: 170 {beats}/min
DEV-0020ICD: NEGATIVE
RV LEAD IMPEDENCE ICD: 342 Ohm
RV LEAD THRESHOLD: 1 V
TZAT-0001ATACH: 1
TZAT-0001FASTVT: 2
TZAT-0001SLOWVT: 1
TZAT-0001SLOWVT: 2
TZAT-0011ATACH: 10 ms
TZAT-0011ATACH: 10 ms
TZAT-0012ATACH: 150 ms
TZAT-0012ATACH: 150 ms
TZAT-0012FASTVT: 170 ms
TZAT-0013SLOWVT: 3
TZAT-0013SLOWVT: 3
TZAT-0018ATACH: NEGATIVE
TZAT-0018SLOWVT: NEGATIVE
TZAT-0018SLOWVT: NEGATIVE
TZAT-0019ATACH: 6 V
TZAT-0019FASTVT: 8 V
TZAT-0019SLOWVT: 8 V
TZAT-0020ATACH: 1.5 ms
TZAT-0020ATACH: 1.5 ms
TZAT-0020ATACH: 1.5 ms
TZAT-0020FASTVT: 1.5 ms
TZAT-0020FASTVT: 1.5 ms
TZAT-0020SLOWVT: 1.5 ms
TZAT-0020SLOWVT: 1.5 ms
TZON-0004SLOWVT: 44
TZON-0004VSLOWVT: 48
TZON-0005SLOWVT: 12
TZST-0001ATACH: 4
TZST-0001ATACH: 5
TZST-0001FASTVT: 3
TZST-0001FASTVT: 4
TZST-0001FASTVT: 5
TZST-0001FASTVT: 6
TZST-0001SLOWVT: 3
TZST-0001SLOWVT: 5
TZST-0001SLOWVT: 6
TZST-0002ATACH: NEGATIVE
TZST-0002FASTVT: NEGATIVE
TZST-0003SLOWVT: 35 J

## 2013-01-28 NOTE — Progress Notes (Signed)
ICD check/device clinic only. See PaceArt report. Follow-up scheduled with Dr. Johney Frame on 02/07/2013.

## 2013-02-07 ENCOUNTER — Ambulatory Visit (INDEPENDENT_AMBULATORY_CARE_PROVIDER_SITE_OTHER): Payer: Self-pay | Admitting: Internal Medicine

## 2013-02-07 ENCOUNTER — Encounter: Payer: Self-pay | Admitting: Internal Medicine

## 2013-02-07 VITALS — BP 117/70 | HR 66 | Wt 202.4 lb

## 2013-02-07 DIAGNOSIS — Z95810 Presence of automatic (implantable) cardiac defibrillator: Secondary | ICD-10-CM

## 2013-02-07 DIAGNOSIS — I420 Dilated cardiomyopathy: Secondary | ICD-10-CM

## 2013-02-07 DIAGNOSIS — I5022 Chronic systolic (congestive) heart failure: Secondary | ICD-10-CM

## 2013-02-07 DIAGNOSIS — I471 Supraventricular tachycardia: Secondary | ICD-10-CM

## 2013-02-07 LAB — ICD DEVICE OBSERVATION
BAMS-0001: 170 {beats}/min
HV IMPEDENCE: 61 Ohm
RV LEAD IMPEDENCE ICD: 361 Ohm
RV LEAD THRESHOLD: 0.75 V
TZAT-0001ATACH: 1
TZAT-0001ATACH: 2
TZAT-0001ATACH: 3
TZAT-0001SLOWVT: 1
TZAT-0001SLOWVT: 2
TZAT-0002FASTVT: NEGATIVE
TZAT-0004SLOWVT: 8
TZAT-0004SLOWVT: 8
TZAT-0012ATACH: 150 ms
TZAT-0012ATACH: 150 ms
TZAT-0013SLOWVT: 3
TZAT-0013SLOWVT: 3
TZAT-0018ATACH: NEGATIVE
TZAT-0018ATACH: NEGATIVE
TZAT-0018SLOWVT: NEGATIVE
TZAT-0019FASTVT: 8 V
TZAT-0019SLOWVT: 8 V
TZAT-0020FASTVT: 1.5 ms
TZAT-0020FASTVT: 1.5 ms
TZAT-0020SLOWVT: 1.5 ms
TZON-0003ATACH: 350 ms
TZON-0003FASTVT: 270 ms
TZON-0004SLOWVT: 44
TZST-0001ATACH: 4
TZST-0001ATACH: 6
TZST-0001FASTVT: 4
TZST-0001FASTVT: 6
TZST-0001SLOWVT: 4
TZST-0001SLOWVT: 6
TZST-0002ATACH: NEGATIVE
TZST-0002FASTVT: NEGATIVE
TZST-0002FASTVT: NEGATIVE
TZST-0003SLOWVT: 25 J
TZST-0003SLOWVT: 35 J

## 2013-02-07 NOTE — Assessment & Plan Note (Signed)
Clinically improved s/p ablation, though ICD interrogation today reveals that she does still have some atach.  She is clinically improved.  We will therefore continue her current regimen.  Given the proximity of her atrial tach focus to the AV nodal, I would be reluctant to return to the EP lab unless she has significant problems.

## 2013-02-07 NOTE — Progress Notes (Signed)
Primary EP:  Dr Graciela Husbands  The patient presents today for routine electrophysiology followup.  Since her ablation, the patient reports doing very well.  She denies palpitations and feels that her SOB is also improved.  Today, she denies symptoms of exertional chest pain, orthopnea, PND, lower extremity edema, dizziness, presyncope, syncope, or neurologic sequela.  The patient feels that she is tolerating medications without difficulties and is otherwise without complaint today.   Past Medical History  Diagnosis Date  . CHF (congestive heart failure)     Dx 03/2012 - dilated cardiomyopathy with EF 20-25% by echo (abnl nuc but normal coronaries 04/02/12 per cath.   . Anemia     felt to be due to heavy menstrual flow  . Chronic systolic heart failure   . Hypertensive heart disease with CHF   . Hypertension   . Dysrhythmia     Bradycardia  . 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 Atrial tachycardia     s/p ablation  . 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 Tubal ligation    . Appendectomy    . Cardiac catheterization  April 2013    normal coronaries  . Icd  08/13/2012  . Ep study and ablation  01/07/13    Ablation of AVNRT and atrial tachycardia (arising from the anteroseptal RA 12mm above the HIS)    Current Outpatient Prescriptions  Medication Sig Dispense Refill  . aspirin EC 81 MG tablet Take 81 mg by mouth daily.      . carvedilol (COREG) 25 MG tablet Take 12.5 mg by mouth 2 (two) times daily with a meal.      . Doxylamine Succinate, Sleep, (SLEEP AID PO) Take 1 tablet by mouth at bedtime as needed. For sleep.      . ferrous sulfate 325 (65 FE) MG tablet Take 325 mg by mouth daily with breakfast.      . furosemide (LASIX) 40 MG tablet Take 40 mg by mouth daily.      Marland Kitchen lisinopril (PRINIVIL,ZESTRIL) 10 MG tablet Take 10 mg by mouth 2 (two) times daily.      . metoprolol succinate  (TOPROL-XL) 50 MG 24 hr tablet Take 50 mg by mouth daily. Take with or immediately following a meal.      . Multiple Vitamin (MULTIVITAMIN WITH MINERALS) TABS Take 1 tablet by mouth daily.      . potassium chloride SA (K-DUR,KLOR-CON) 20 MEQ tablet Take 20 mEq by mouth daily.      Marland Kitchen spironolactone (ALDACTONE) 25 MG tablet Take 12.5 mg by mouth daily.       No current facility-administered medications for this visit.    No Known Allergies  History   Social History  . Marital Status: Single    Spouse Name: [name]    Number of Children: 3  . Years of Education: [name]   Occupational History  . receptionist   . office cleaning    Social History Main Topics  . Smoking status: Never Smoker   . Smokeless tobacco: Never Used  . Alcohol Use: 1.2 oz/week    2 Glasses of wine per week     Comment: daily  . Drug Use: No  . Sexually Active: Yes    Birth Control/ Protection: Condom   Other Topics Concern  . Not on file   Social History Narrative  . No narrative on file  Family History  Problem Relation Age of Onset  . Breast cancer Mother   . Cancer Father     Physical Exam: Filed Vitals:   02/07/13 1042  BP: 117/70  Pulse: 66  Weight: 202 lb 6.4 oz (91.808 kg)    GEN- The patient is well appearing, alert and oriented x 3 today.   Head- normocephalic, atraumatic Eyes-  Sclera clear, conjunctiva pink Ears- hearing intact Oropharynx- clear Neck- supple, no JVP Lymph- no cervical lymphadenopathy Lungs- Clear to ausculation bilaterally, normal work of breathing Heart- Regular rate and rhythm, no murmurs, rubs or gallops, PMI not laterally displaced GI- soft, NT, ND, + BS Extremities- no clubbing, cyanosis, or edema  ekg today reveals sinus rhythm 64 bpm, PR 146, diffuse t wave inversions  Assessment and Plan:

## 2013-02-07 NOTE — Patient Instructions (Addendum)
Your physician wants you to follow-up in: 6 months with Dr Logan Bores will receive a reminder letter in the mail two months in advance. If you don't receive a letter, please call our office to schedule the follow-up appointment.   Remote monitoring is used to monitor your Pacemaker of ICD from home. This monitoring reduces the number of office visits required to check your device to one time per year. It allows [place] to keep an eye on the functioning of your device to ensure it is working properly. You are scheduled for a device check from home on 05/09/13. You may send your transmission at any time that day. If you have a wireless device, the transmission will be sent automatically. After your physician reviews your transmission, you will receive a postcard with your next transmission date.

## 2013-02-07 NOTE — Assessment & Plan Note (Signed)
Stable No change required today  Normal ICD function See Pace Art report No changes today  Will enroll in [facility] Return to see Dr Graciela Husbands in 6 months

## 2013-02-28 ENCOUNTER — Other Ambulatory Visit: Payer: Self-pay | Admitting: Internal Medicine

## 2013-03-18 ENCOUNTER — Other Ambulatory Visit: Payer: Self-pay | Admitting: Internal Medicine

## 2013-03-27 ENCOUNTER — Other Ambulatory Visit: Payer: Self-pay | Admitting: Internal Medicine

## 2013-03-28 ENCOUNTER — Encounter: Payer: Self-pay | Admitting: Emergency Medicine

## 2013-05-09 ENCOUNTER — Encounter: Payer: Self-pay | Admitting: *Deleted

## 2013-05-13 ENCOUNTER — Encounter: Payer: Self-pay | Admitting: *Deleted

## 2013-05-18 ENCOUNTER — Other Ambulatory Visit: Payer: Self-pay

## 2013-05-18 MED ORDER — POTASSIUM CHLORIDE CRYS ER 20 MEQ PO TBCR: 20.0000 meq | EXTENDED_RELEASE_TABLET | Freq: Every day | ORAL | Status: DC

## 2013-06-15 ENCOUNTER — Other Ambulatory Visit: Payer: Self-pay | Admitting: *Deleted

## 2013-06-15 MED ORDER — FUROSEMIDE 40 MG PO TABS: 40.0000 mg | ORAL_TABLET | Freq: Every day | ORAL | Status: DC

## 2013-06-23 ENCOUNTER — Other Ambulatory Visit: Payer: Self-pay

## 2013-06-23 ENCOUNTER — Other Ambulatory Visit: Payer: Self-pay | Admitting: Nurse Practitioner

## 2013-06-23 MED ORDER — LISINOPRIL 10 MG PO TABS: 10.0000 mg | ORAL_TABLET | Freq: Two times a day (BID) | ORAL | Status: DC

## 2013-08-27 ENCOUNTER — Emergency Department (INDEPENDENT_AMBULATORY_CARE_PROVIDER_SITE_OTHER)
Admission: EM | Admit: 2013-08-27 | Discharge: 2013-08-27 | Disposition: A | Discharge status: Home / Self Care | Payer: No Typology Code available for payment source | Source: Home / Self Care

## 2013-08-27 ENCOUNTER — Encounter (HOSPITAL_COMMUNITY): Payer: Self-pay

## 2013-08-27 DIAGNOSIS — K029 Dental caries, unspecified: Secondary | ICD-10-CM

## 2013-08-27 MED ORDER — CEPHALEXIN 500 MG PO CAPS: 500.0000 mg | ORAL_CAPSULE | Freq: Two times a day (BID) | ORAL | Status: DC

## 2013-08-27 MED ORDER — TRAMADOL HCL 50 MG PO TABS
50.0000 mg | ORAL_TABLET | Freq: Four times a day (QID) | ORAL | Status: DC | PRN
Start: 2013-08-27 — End: 2014-02-04

## 2013-08-27 NOTE — ED Provider Notes (Signed)
CSN: [id number]     Arrival date & time 08/27/13  1758 History   None    Chief Complaint  Patient presents with  . Dental Pain    approx 2 weeks   (Consider location/radiation/quality/duration/timing/severity/associated sxs/prior Treatment) Patient is a 47 y.o. female presenting with tooth pain.  Dental Pain Location:  Upper Quality:  Aching Severity:  Severe Duration:  2 weeks Context: dental caries   Relieved by:  Nothing   Past Medical History  Diagnosis Date  . CHF (congestive heart failure)     Dx 03/2012 - dilated cardiomyopathy with EF 20-25% by echo (abnl nuc but normal coronaries 04/02/12 per cath.   . Anemia     felt to be due to heavy menstrual flow  . Chronic systolic heart failure   . Hypertensive heart disease with CHF   . Hypertension   . Dysrhythmia     Bradycardia  . 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 Atrial tachycardia     s/p ablation  . 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 Tubal ligation    . Appendectomy    . Cardiac catheterization  April 2013    normal coronaries  . Icd  08/13/2012  . Ep study and ablation  01/07/13    Ablation of AVNRT and atrial tachycardia (arising from the anteroseptal RA 12mm above the HIS)   Family History  Problem Relation Age of Onset  . Breast cancer Mother   . Cancer Father    History  Substance Use Topics  . Smoking status: Never Smoker   . Smokeless tobacco: Never Used  . Alcohol Use: 1.2 oz/week    2 Glasses of wine per week     Comment: daily   OB History   Grav Para Term Preterm Abortions TAB SAB Ect Mult Living                 Review of Systems  Constitutional: Negative.   HENT: Positive for dental problem.        History of poor dentition with multiple caries and extractions.  Patient states she does not have dental insurance but is trying to get some. Patient states she was told quite a while back  that she needed to have this particular tooth removed.  Eyes: Negative.   Respiratory: Negative.   Cardiovascular:       Patient has a history of heart failure.  Gastrointestinal: Negative.   Endocrine: Negative.   Genitourinary: Negative.   Musculoskeletal: Negative.   Skin: Negative.   Allergic/Immunologic: Negative.   Neurological: Negative.   Hematological: Negative.   Psychiatric/Behavioral: Negative.     Allergies  Review of patient's allergies indicates no known allergies.  Home Medications   Current Outpatient Rx  Name  Route  Sig  Dispense  Refill  . aspirin EC 81 MG tablet   Oral   Take 81 mg by mouth daily.         . carvedilol (COREG) 25 MG tablet      TAKE ONE-HALF TABLET BY MOUTH TWICE DAILY   30 tablet   3   . Doxylamine Succinate, Sleep, (SLEEP AID PO)   Oral   Take 1 tablet by mouth at bedtime as needed. For sleep.         . ferrous sulfate 325 (65 FE) MG tablet   Oral   Take 325 mg by mouth  daily with breakfast.         . furosemide (LASIX) 40 MG tablet   Oral   Take 1 tablet (40 mg total) by mouth daily.   30 tablet   5   . lisinopril (PRINIVIL,ZESTRIL) 10 MG tablet   Oral   Take 1 tablet (10 mg total) by mouth 2 (two) times daily.   60 tablet   5   . metoprolol succinate (TOPROL-XL) 25 MG 24 hr tablet      TAKE ONE-HALF TABLET BY MOUTH EVERY DAY   30 tablet   0   . metoprolol succinate (TOPROL-XL) 50 MG 24 hr tablet   Oral   Take 50 mg by mouth daily. Take with or immediately following a meal.         . Multiple Vitamin (MULTIVITAMIN WITH MINERALS) TABS   Oral   Take 1 tablet by mouth daily.         . potassium chloride SA (K-DUR,KLOR-CON) 20 MEQ tablet   Oral   Take 1 tablet (20 mEq total) by mouth daily.   30 tablet   5   . spironolactone (ALDACTONE) 25 MG tablet      TAKE ONE-HALF TABLET BY MOUTH EVERY DAY   45 tablet   3   . cephALEXin (KEFLEX) 500 MG capsule   Oral   Take 1 capsule (500 mg total) by  mouth 2 (two) times daily.   10 capsule   0   . traMADol (ULTRAM) 50 MG tablet   Oral   Take 1 tablet (50 mg total) by mouth every 6 (six) hours as needed for pain.   15 tablet   0    BP 146/69  Pulse 76  Temp(Src) 99 F (37.2 C) (Oral)  Resp 16  SpO2 99%  LMP 08/12/2013 Physical Exam  Nursing note and vitals reviewed. Constitutional: She is oriented to person, place, and time. She appears well-developed and well-nourished.  HENT:  Head: Normocephalic and atraumatic.  Cardiovascular: Normal rate, regular rhythm, normal heart sounds and intact distal pulses.  Exam reveals no gallop and no friction rub.   No murmur heard. Patient has a defibrillator placed in left anterior chest.  Pulmonary/Chest: Effort normal and breath sounds normal. No respiratory distress.  Neurological: She is alert and oriented to person, place, and time.  Skin: Skin is warm and dry. No rash noted. No erythema. No pallor.  Psychiatric: She has a normal mood and affect. Her behavior is normal.    ED Course  Procedures (including critical care time) Labs Review Labs Reviewed - No data to display Imaging Review No results found.  MDM   1. Dental caries    Meds ordered this encounter  Medications  . cephALEXin (KEFLEX) 500 MG capsule    Sig: Take 1 capsule (500 mg total) by mouth 2 (two) times daily.    Dispense:  10 capsule    Refill:  0  . traMADol (ULTRAM) 50 MG tablet    Sig: Take 1 tablet (50 mg total) by mouth every 6 (six) hours as needed for pain.    Dispense:  15 tablet    Refill:  0   Discussed plan of care with Dr. Artis Flock.  Patient to followup with the on-call dentist or with other dental clinic in the area. Patient given information on 3-4 low-cost/free options for dental care.    Weber Cooks, NP 08/27/13 1910  Weber Cooks, NP 08/28/13 2017

## 2013-08-27 NOTE — ED Notes (Signed)
C/o tooth pain and swelling x 2 weeks , unable to see dentist at this time, waiting on dental insurance

## 2013-08-30 IMAGING — CR DG CHEST 2V
2 series · 2 of 2 positions shown · non-contrast
Comparison: None.

CLINICAL DATA: Chest pain and shortness of breath.  Cough.

CHEST - 2 VIEW

[w chest pa]
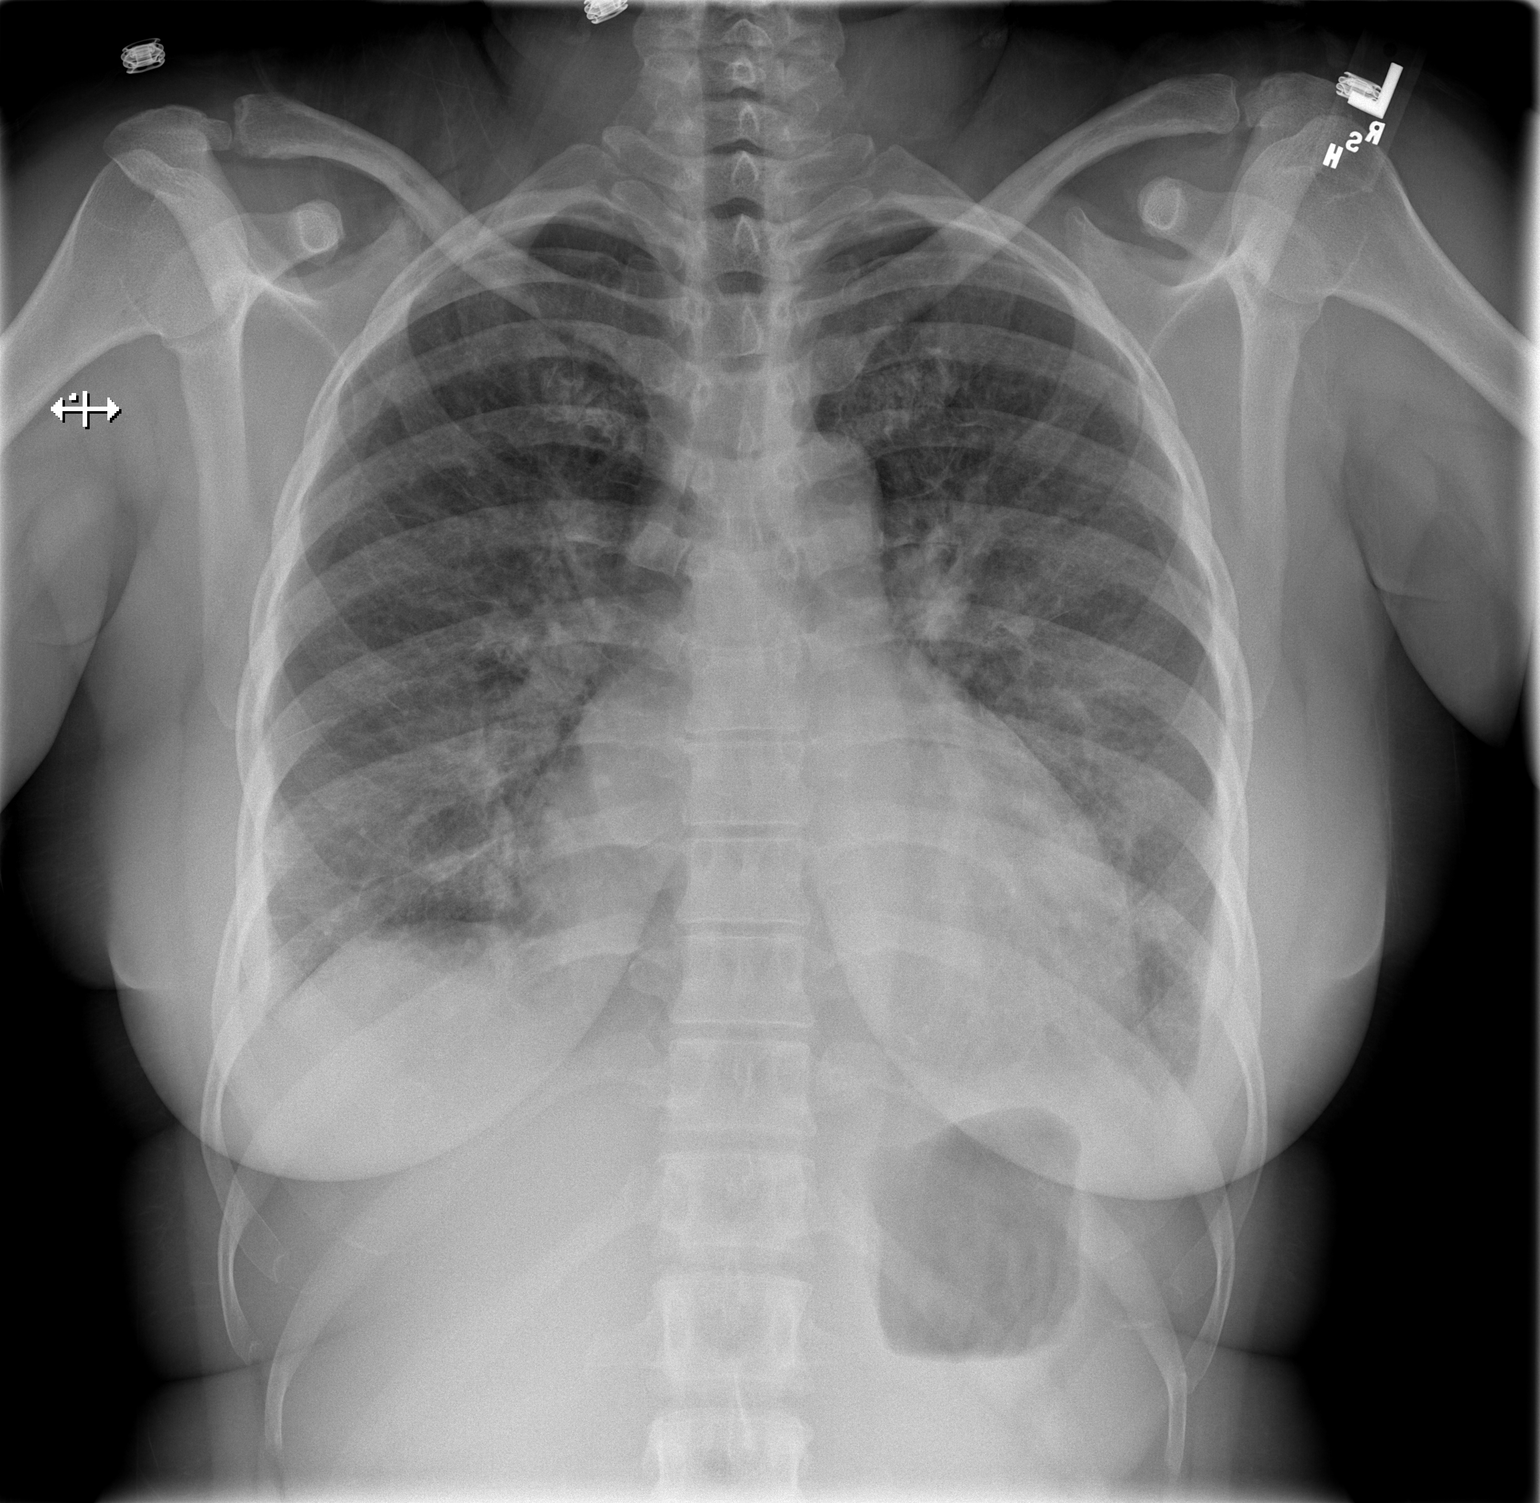

[w chest lat]
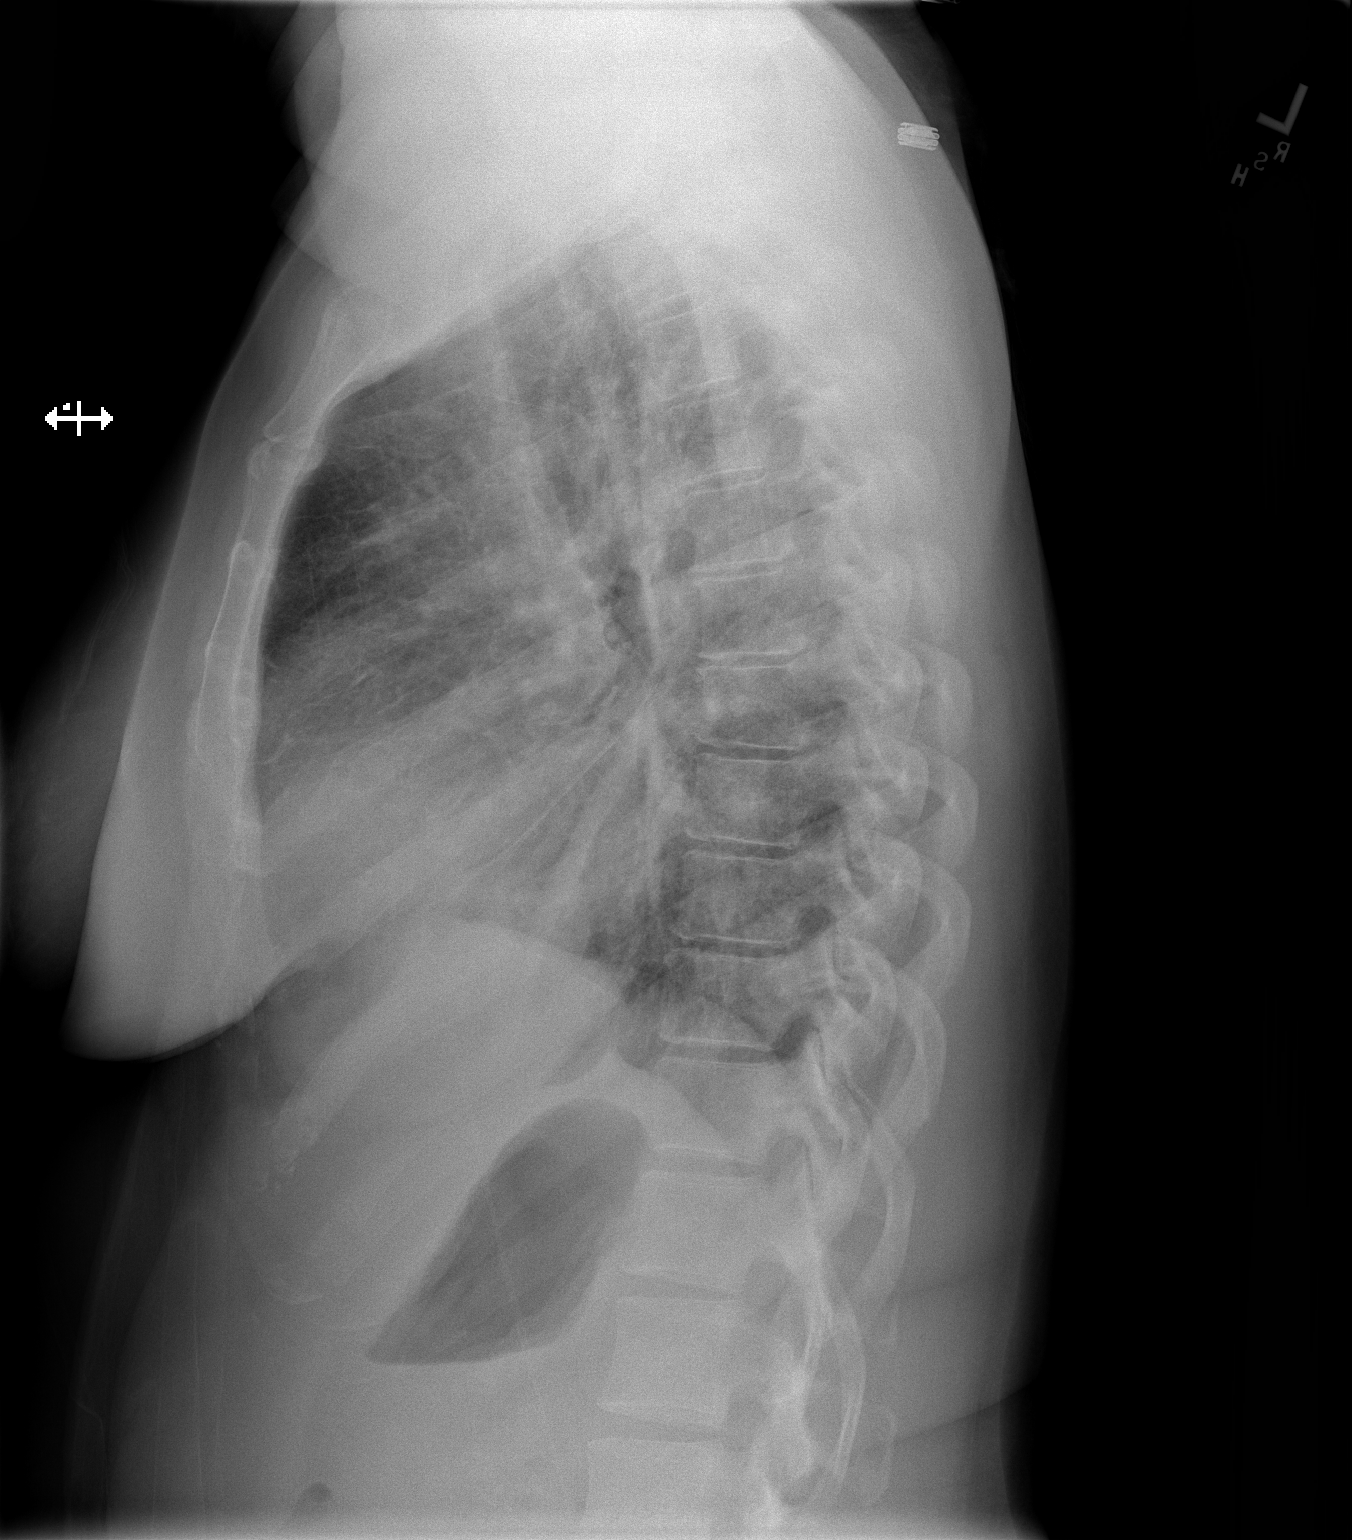

[2 of 2 positions shown; findings below may reference images not displayed]

FINDINGS: Patchy airspace disease is seen at the lung bases with
small bilateral pleural effusions. The cardiopericardial silhouette
is enlarged. Imaged bony structures of the thorax are intact.
IMPRESSION: Cardiomegaly with vascular congestion, basilar interstitial opacity
and small bilateral pleural effusions. Failure would be a
consideration.

## 2013-08-31 NOTE — ED Provider Notes (Signed)
Medical screening examination/treatment/procedure(s) were performed by resident physician or non-physician practitioner and as supervising physician I was immediately available for consultation/collaboration.   Barkley Bruns MD.   Linna Hoff, MD 08/31/13 [id number]

## 2013-09-02 ENCOUNTER — Telehealth: Payer: Self-pay | Admitting: Internal Medicine

## 2013-09-02 NOTE — Telephone Encounter (Signed)
New Problem:  Pt states she needs cardiac clearance for a dental procedure. Please contact the pt.

## 2013-09-06 NOTE — Telephone Encounter (Signed)
Explained to patient that dental office would send [place] paperwork for cardiac clearance, and she stated she would call them to remind them.

## 2013-09-07 ENCOUNTER — Ambulatory Visit (INDEPENDENT_AMBULATORY_CARE_PROVIDER_SITE_OTHER): Payer: No Typology Code available for payment source | Admitting: Internal Medicine

## 2013-09-07 VITALS — BP 142/70 | HR 84 | Ht 67.5 in | Wt 197.0 lb

## 2013-09-07 DIAGNOSIS — Z95810 Presence of automatic (implantable) cardiac defibrillator: Secondary | ICD-10-CM

## 2013-09-07 DIAGNOSIS — I428 Other cardiomyopathies: Secondary | ICD-10-CM

## 2013-09-07 DIAGNOSIS — I471 Supraventricular tachycardia: Secondary | ICD-10-CM

## 2013-09-07 DIAGNOSIS — I4719 Other supraventricular tachycardia: Secondary | ICD-10-CM

## 2013-09-07 DIAGNOSIS — I5022 Chronic systolic (congestive) heart failure: Secondary | ICD-10-CM

## 2013-09-07 DIAGNOSIS — I498 Other specified cardiac arrhythmias: Secondary | ICD-10-CM

## 2013-09-07 DIAGNOSIS — I420 Dilated cardiomyopathy: Secondary | ICD-10-CM

## 2013-09-07 LAB — ICD DEVICE OBSERVATION
AL AMPLITUDE: 1.3 mv
ATRIAL PACING ICD: 6.3 pct
BATTERY VOLTAGE: 3.16 V
RV LEAD AMPLITUDE: 10.4 mv
TZAT-0001ATACH: 1
TZAT-0001ATACH: 2
TZAT-0001ATACH: 3
TZAT-0001FASTVT: 1
TZAT-0004SLOWVT: 8
TZAT-0004SLOWVT: 8
TZAT-0005SLOWVT: 88 pct
TZAT-0005SLOWVT: 91 pct
TZAT-0011ATACH: 10 ms
TZAT-0011SLOWVT: 10 ms
TZAT-0011SLOWVT: 10 ms
TZAT-0012ATACH: 150 ms
TZAT-0012ATACH: 150 ms
TZAT-0012FASTVT: 170 ms
TZAT-0012FASTVT: 170 ms
TZAT-0018ATACH: NEGATIVE
TZAT-0018FASTVT: NEGATIVE
TZAT-0018SLOWVT: NEGATIVE
TZAT-0018SLOWVT: NEGATIVE
TZAT-0020ATACH: 1.5 ms
TZAT-0020ATACH: 1.5 ms
TZAT-0020ATACH: 1.5 ms
TZAT-0020FASTVT: 1.5 ms
TZON-0004SLOWVT: 44
TZON-0004VSLOWVT: 48
TZON-0005SLOWVT: 12
TZST-0001ATACH: 4
TZST-0001ATACH: 6
TZST-0001FASTVT: 3
TZST-0001FASTVT: 5
TZST-0001SLOWVT: 4
TZST-0001SLOWVT: 6
TZST-0002ATACH: NEGATIVE
TZST-0002FASTVT: NEGATIVE
TZST-0003SLOWVT: 25 J
TZST-0003SLOWVT: 35 J
VENTRICULAR PACING ICD: 0.1 pct

## 2013-09-07 NOTE — Progress Notes (Signed)
Patient Care Team: Provider Default, MD as PCP - General (Orthopedic Surgery)   HPI  Sydney Shaffer is a 47 y.o. female Seen in followup for  ICD implanted Sept 2013 for primary prevention in the setting of nonischemic cardiomyopathy. QRS was narrow.  She noted tachycardia palpitations prior to the onset of ICD discharge. Retrospectively, she has had these spells over the last couple of years. The abrupt in onset as well as offset and are associated with lightheadedness fatigue and shortness of breath. She underwent mapping and ablation by Dr. Johney Frame was able to modify the eliminate the focus. He was concerned about its proximity to be AV node. When he saw her last 3/14 she was doing better.     Past Medical History  Diagnosis Date  . CHF (congestive heart failure)     Dx 03/2012 - dilated cardiomyopathy with EF 20-25% by echo (abnl nuc but normal coronaries 04/02/12 per cath.   . Anemia     felt to be due to heavy menstrual flow  . Chronic systolic heart failure   . Hypertensive heart disease with CHF   . Hypertension   . Dysrhythmia     Bradycardia  . 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 Atrial tachycardia     s/p ablation  . 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 Tubal ligation    . Appendectomy    . Cardiac catheterization  April 2013    normal coronaries  . Icd  08/13/2012  . Ep study and ablation  01/07/13    Ablation of AVNRT and atrial tachycardia (arising from the anteroseptal RA 12mm above the HIS)    Current Outpatient Prescriptions  Medication Sig Dispense Refill  . aspirin EC 81 MG tablet Take 81 mg by mouth daily.      . carvedilol (COREG) 25 MG tablet TAKE ONE-HALF TABLET BY MOUTH TWICE DAILY  30 tablet  3  . cephALEXin (KEFLEX) 500 MG capsule Take 1 capsule (500 mg total) by mouth 2 (two) times daily.  10 capsule  0  . Doxylamine Succinate, Sleep, (SLEEP AID PO)  Take 1 tablet by mouth at bedtime as needed. For sleep.      . ferrous sulfate 325 (65 FE) MG tablet Take 325 mg by mouth daily with breakfast.      . furosemide (LASIX) 40 MG tablet Take 1 tablet (40 mg total) by mouth daily.  30 tablet  5  . lisinopril (PRINIVIL,ZESTRIL) 10 MG tablet Take 1 tablet (10 mg total) by mouth 2 (two) times daily.  60 tablet  5  . metoprolol succinate (TOPROL-XL) 25 MG 24 hr tablet TAKE ONE-HALF TABLET BY MOUTH EVERY DAY  30 tablet  0  . metoprolol succinate (TOPROL-XL) 50 MG 24 hr tablet Take 50 mg by mouth daily. Take with or immediately following a meal.      . Multiple Vitamin (MULTIVITAMIN WITH MINERALS) TABS Take 1 tablet by mouth daily.      . potassium chloride SA (K-DUR,KLOR-CON) 20 MEQ tablet Take 1 tablet (20 mEq total) by mouth daily.  30 tablet  5  . spironolactone (ALDACTONE) 25 MG tablet TAKE ONE-HALF TABLET BY MOUTH EVERY DAY  45 tablet  3  . traMADol (ULTRAM) 50 MG tablet Take 1 tablet (50 mg total) by mouth every 6 (six) hours as needed for pain.  15 tablet  0   No  current facility-administered medications for this visit.    No Known Allergies  Review of Systems negative except from HPI and PMH  Physical Exam  Well developed and nourished in no acute distress HENT normal Neck supple with JVP-flat   Device pocket well healed; without hematoma or erythema  Clear Regular rate and rhythm, no murmurs or gallops Abd-soft with active BS without hepatomegaly No Clubbing cyanosis edema Skin-warm and dry;small keloid A & Oriented  Grossly normal sensory and motor function  Electrocardiogram demonstrates sinus rhythm at 77 Intervals 15/09/42 Axis is leftward at -19 Nonspecific ST-T changes inferolaterally Left ventricular hypertrophy  Assessment and  Plan

## 2013-09-07 NOTE — Assessment & Plan Note (Signed)
The patient's device was interrogated.  The information was reviewed. No changes were made in the programming.    

## 2013-09-07 NOTE — Assessment & Plan Note (Signed)
Recurrences noted we'll without symptoms. Her device has been reprogrammed to activate wavelet and to increase the rate detection to 225. Hopefully we will thereby avoid inappropriate shocks

## 2013-09-07 NOTE — Patient Instructions (Addendum)
Remote monitoring is used to monitor your Pacemaker of ICD from home. This monitoring reduces the number of office visits required to check your device to one time per year. It allows [place] to keep an eye on the functioning of your device to ensure it is working properly. You are scheduled for a device check from home on December 09, 2013. You may send your transmission at any time that day. If you have a wireless device, the transmission will be sent automatically. After your physician reviews your transmission, you will receive a postcard with your next transmission date.  Your physician wants you to follow-up in: 1 year with Dr Graciela Husbands.  You will receive a reminder letter in the mail two months in advance. If you don't receive a letter, please call our office to schedule the follow-up appointment.

## 2013-09-07 NOTE — Assessment & Plan Note (Signed)
Stable

## 2013-09-14 ENCOUNTER — Telehealth: Payer: Self-pay | Admitting: Internal Medicine

## 2013-09-14 ENCOUNTER — Encounter: Payer: Self-pay | Admitting: Internal Medicine

## 2013-09-14 NOTE — Telephone Encounter (Signed)
New message   Need clearance to have dental work--- Dr Earlene Plater faxed over clearance note but have not received an ok

## 2013-09-14 NOTE — Telephone Encounter (Signed)
Spoke with patient who states she needs to have dental work (4-part extraction) beginning Tuesday 10/14 (there may be multiple procedures) and needs clearance sent to Dr. Earlene Plater.  I looked for the form that the patient said Dr. Earlene Plater faxed and did not find it.  I advised patient that I would forward message to Dr. Graciela Husbands and his primary nurse, Dory Horn, RN for follow-up tomorrow.   Patient verbalized understanding and agreement.

## 2013-09-15 ENCOUNTER — Encounter: Payer: Self-pay | Admitting: *Deleted

## 2013-09-15 NOTE — Telephone Encounter (Signed)
Pt may have procedures on mouth from cardiac perspective

## 2013-09-15 NOTE — Telephone Encounter (Signed)
Explained to patient that I spoke with her dentist office earlier in the week. I will fax paperwork to them today. Patient expressed her gratitude and agreeable to plan.

## 2013-09-19 ENCOUNTER — Other Ambulatory Visit: Payer: Self-pay | Admitting: Internal Medicine

## 2013-11-28 ENCOUNTER — Other Ambulatory Visit: Payer: Self-pay | Admitting: Internal Medicine

## 2013-12-09 ENCOUNTER — Encounter: Payer: No Typology Code available for payment source | Admitting: *Deleted

## 2013-12-15 ENCOUNTER — Encounter: Payer: Self-pay | Admitting: *Deleted

## 2014-01-15 IMAGING — CR DG CHEST 2V
3 series · 3 of 3 positions shown · non-contrast
Comparison: 03/31/2012

CLINICAL DATA: Post pacemaker insertion

CHEST - 2 VIEW

[w chest pa]
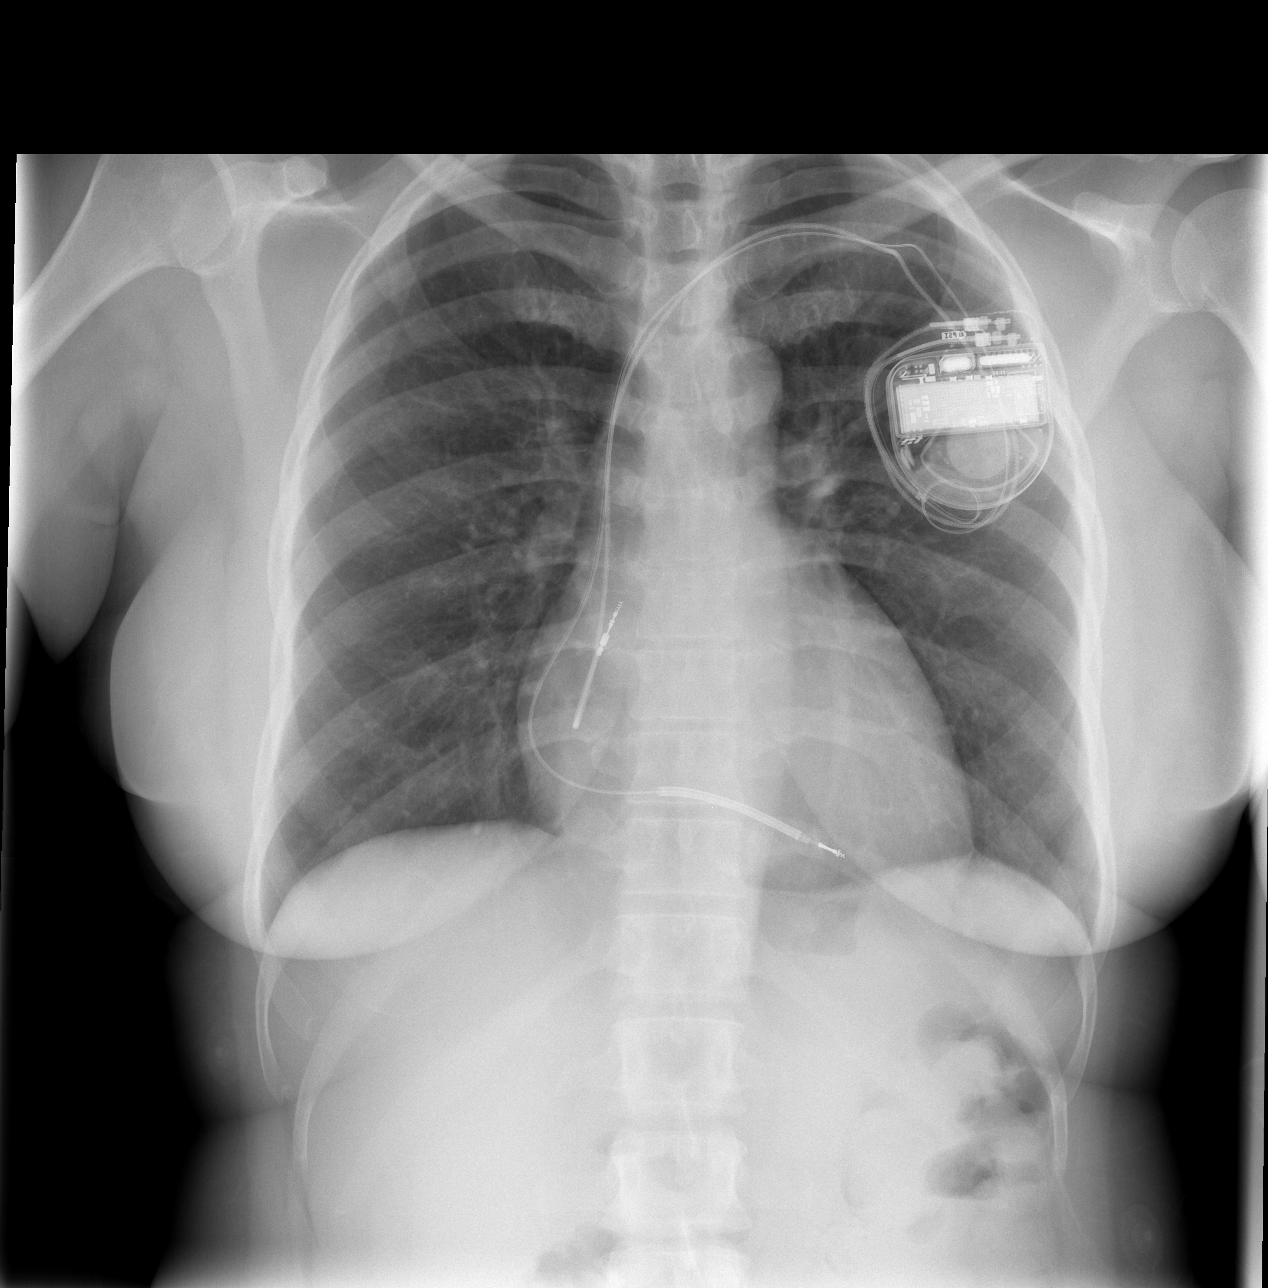

[w chest lat (1 of 2)]
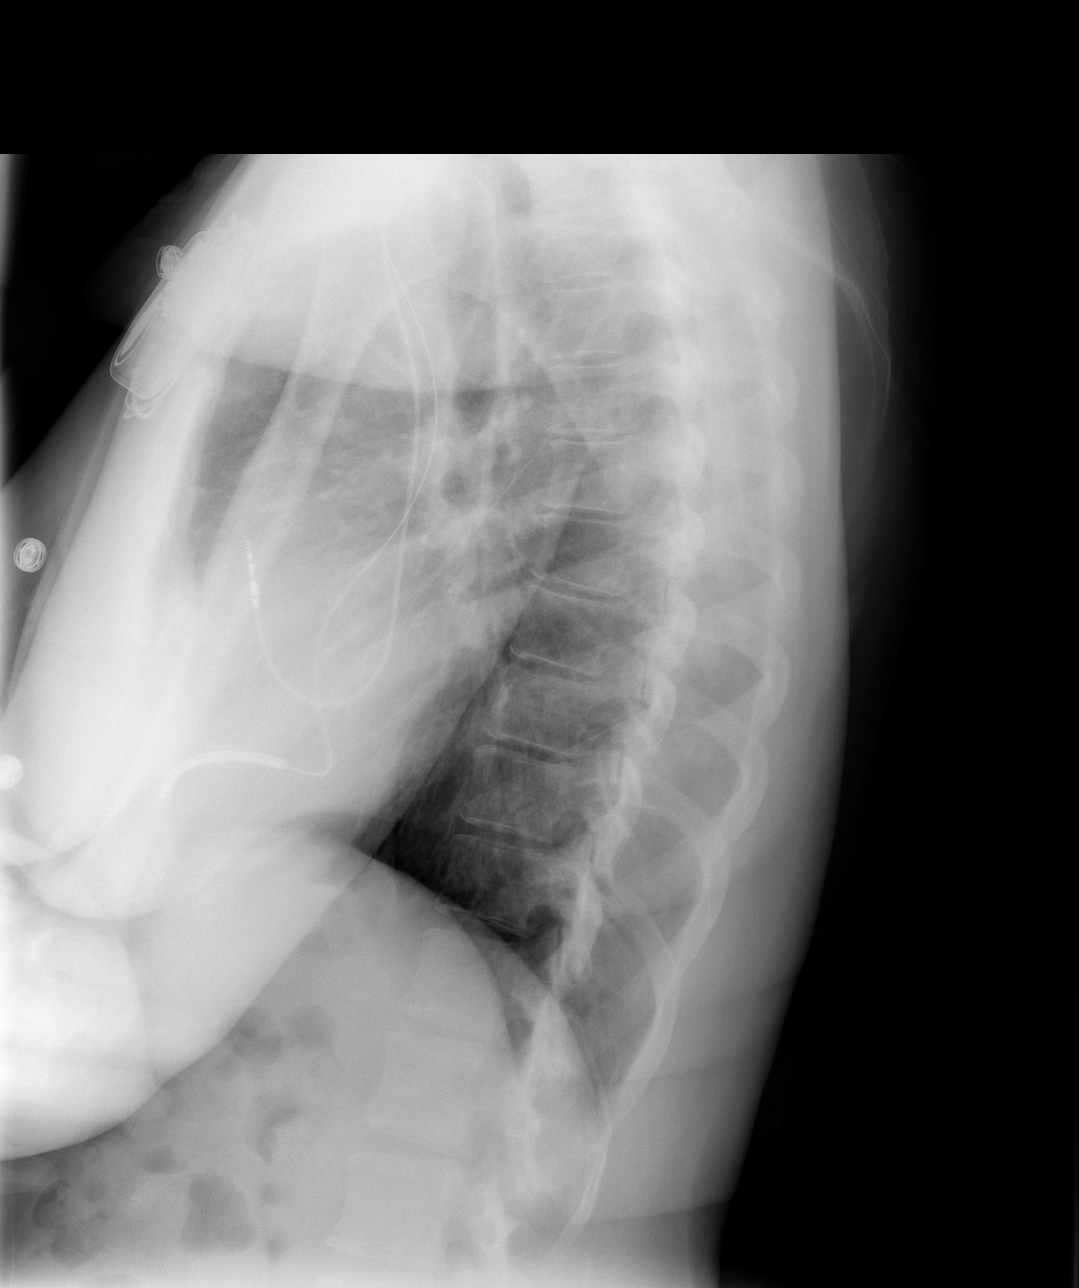

[w chest lat (2 of 2)]
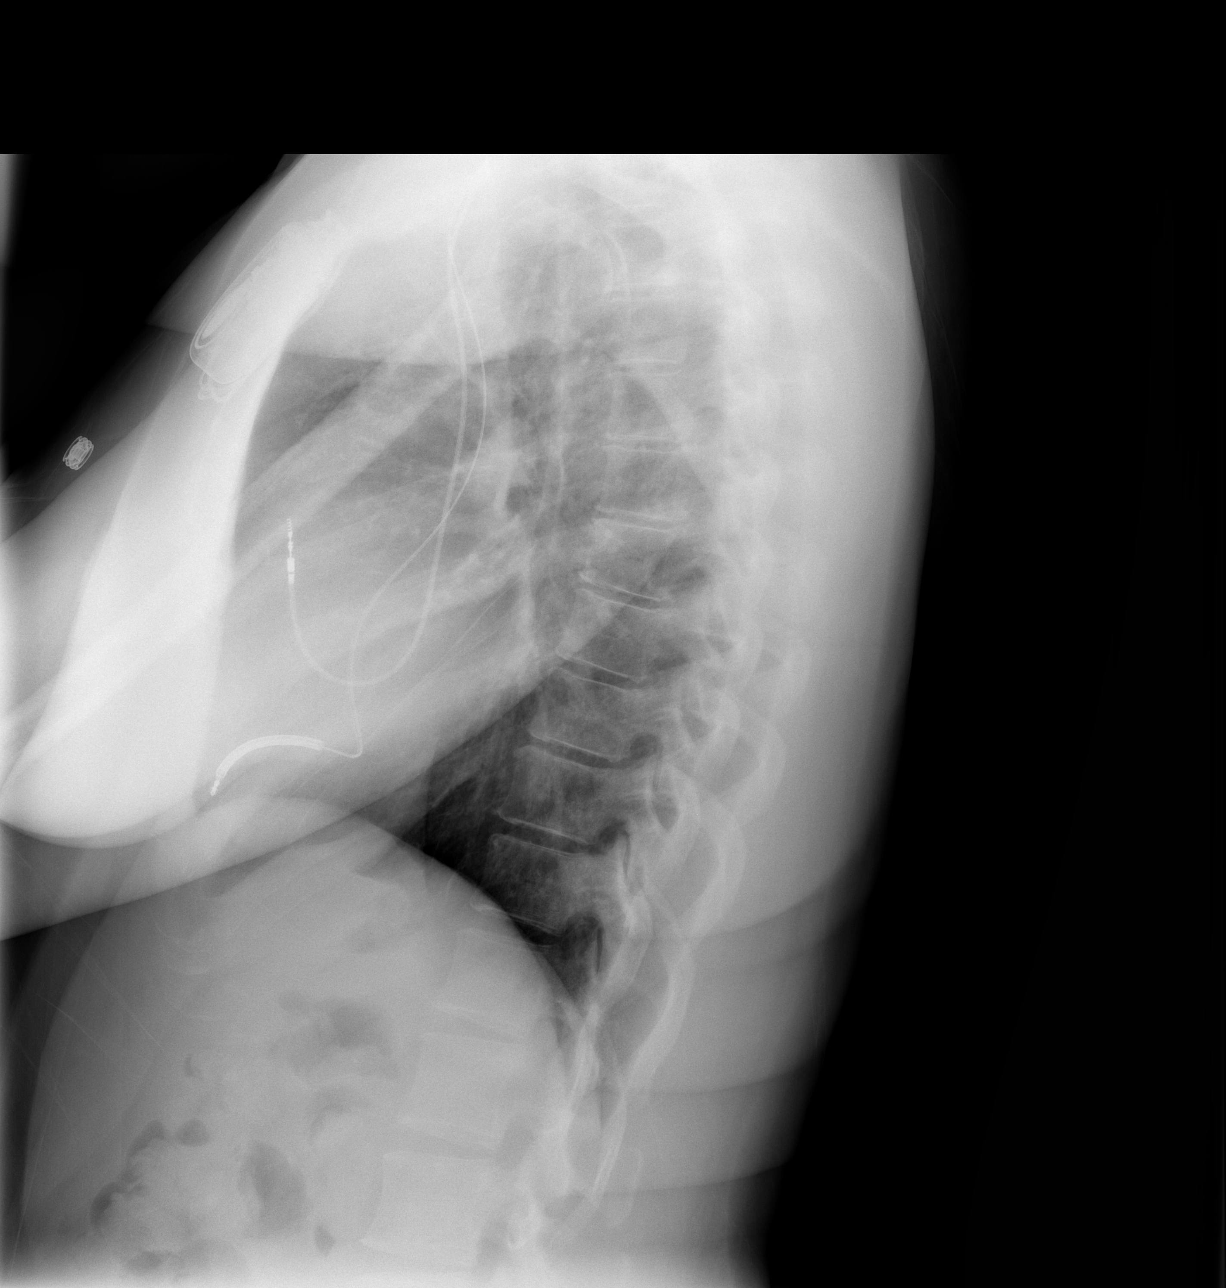

[3 of 3 positions shown; findings below may reference images not displayed]

FINDINGS: Grossly unchanged cardiac silhouette and mediastinal contours.
Interval placement of left anterior chest wall dual lead AICD /
pacemaker with tips overlying expected location of the right atrium
and ventricle.  Improved inspiratory effort.  No focal airspace
opacities.  No definite evidence of pulmonary edema.  No
pneumothorax.  Unchanged bones.
IMPRESSION: 1.  Interval placement of left anterior chest wall AICD/pacemaker
without evidence of complication.
2.  No acute cardiopulmonary disease.

## 2014-02-02 ENCOUNTER — Encounter: Payer: No Typology Code available for payment source | Admitting: Internal Medicine

## 2014-02-03 ENCOUNTER — Observation Stay (HOSPITAL_COMMUNITY)
Admission: EM | Admit: 2014-02-03 | Discharge: 2014-02-05 | DRG: 292 | Disposition: A | Discharge status: Home / Self Care | Payer: No Typology Code available for payment source | Attending: Internal Medicine | Admitting: Internal Medicine

## 2014-02-03 ENCOUNTER — Encounter (HOSPITAL_COMMUNITY): Payer: Self-pay | Admitting: Emergency Medicine

## 2014-02-03 ENCOUNTER — Emergency Department (HOSPITAL_COMMUNITY): Payer: No Typology Code available for payment source

## 2014-02-03 DIAGNOSIS — I110 Hypertensive heart disease with heart failure: Secondary | ICD-10-CM | POA: Diagnosis present

## 2014-02-03 DIAGNOSIS — D649 Anemia, unspecified: Secondary | ICD-10-CM

## 2014-02-03 DIAGNOSIS — Z95810 Presence of automatic (implantable) cardiac defibrillator: Secondary | ICD-10-CM | POA: Diagnosis present

## 2014-02-03 DIAGNOSIS — I509 Heart failure, unspecified: Secondary | ICD-10-CM | POA: Diagnosis present

## 2014-02-03 DIAGNOSIS — R079 Chest pain, unspecified: Secondary | ICD-10-CM

## 2014-02-03 DIAGNOSIS — Z7982 Long term (current) use of aspirin: Secondary | ICD-10-CM

## 2014-02-03 DIAGNOSIS — I428 Other cardiomyopathies: Secondary | ICD-10-CM

## 2014-02-03 DIAGNOSIS — Z79899 Other long term (current) drug therapy: Secondary | ICD-10-CM

## 2014-02-03 DIAGNOSIS — I4719 Other supraventricular tachycardia: Secondary | ICD-10-CM | POA: Diagnosis present

## 2014-02-03 DIAGNOSIS — I498 Other specified cardiac arrhythmias: Secondary | ICD-10-CM | POA: Diagnosis present

## 2014-02-03 DIAGNOSIS — I471 Supraventricular tachycardia: Secondary | ICD-10-CM | POA: Diagnosis present

## 2014-02-03 DIAGNOSIS — I420 Dilated cardiomyopathy: Secondary | ICD-10-CM | POA: Diagnosis present

## 2014-02-03 DIAGNOSIS — I5022 Chronic systolic (congestive) heart failure: Secondary | ICD-10-CM

## 2014-02-03 DIAGNOSIS — I5023 Acute on chronic systolic (congestive) heart failure: Principal | ICD-10-CM | POA: Diagnosis present

## 2014-02-03 DIAGNOSIS — Z803 Family history of malignant neoplasm of breast: Secondary | ICD-10-CM

## 2014-02-03 DIAGNOSIS — K219 Gastro-esophageal reflux disease without esophagitis: Secondary | ICD-10-CM

## 2014-02-03 LAB — BASIC METABOLIC PANEL
BUN: 18 mg/dL (ref 6–23)
CALCIUM: 9.2 mg/dL (ref 8.4–10.5)
CO2: 27 mEq/L (ref 19–32)
CREATININE: 0.92 mg/dL (ref 0.50–1.10)
Chloride: 104 mEq/L (ref 96–112)
GFR, EST AFRICAN AMERICAN: 84 mL/min — AB (ref >90)
GFR, EST NON AFRICAN AMERICAN: 72 mL/min — AB (ref >90)
Glucose, Bld: 102 mg/dL — ABNORMAL HIGH (ref 70–99)
POTASSIUM: 3.8 meq/L (ref 3.7–5.3)
Sodium: 142 mEq/L (ref 137–147)

## 2014-02-03 LAB — CBC
HEMATOCRIT: 33.1 % — AB (ref 36.0–46.0)
Hemoglobin: 11.1 g/dL — ABNORMAL LOW (ref 12.0–15.0)
MCH: 28.2 pg (ref 26.0–34.0)
MCHC: 33.5 g/dL (ref 30.0–36.0)
MCV: 84 fL (ref 78.0–100.0)
Platelets: 295 10*3/uL (ref 150–400)
RBC: 3.94 MIL/uL (ref 3.87–5.11)
RDW: 15.1 % (ref 11.5–15.5)
WBC: 7.2 10*3/uL (ref 4.0–10.5)

## 2014-02-03 LAB — LIPID PANEL
CHOL/HDL RATIO: 1.8 ratio
CHOLESTEROL: 151 mg/dL (ref 0–200)
HDL: 86 mg/dL (ref >39)
LDL Cholesterol: 51 mg/dL (ref 0–99)
Triglycerides: 69 mg/dL (ref <150)
VLDL: 14 mg/dL (ref 0–40)

## 2014-02-03 LAB — TROPONIN I: Troponin I: <0.3 ng/mL (ref <0.30)

## 2014-02-03 LAB — I-STAT TROPONIN, ED: Troponin i, poc: 0.01 ng/mL (ref 0.00–0.08)

## 2014-02-03 LAB — PRO B NATRIURETIC PEPTIDE: Pro B Natriuretic peptide (BNP): 305.2 pg/mL — ABNORMAL HIGH (ref 0–125)

## 2014-02-03 MED ORDER — SODIUM CHLORIDE 0.9 % IJ SOLN
3.0000 mL | Freq: Two times a day (BID) | INTRAMUSCULAR | Status: DC
  Administered 2014-02-04 – 2014-02-05 (×2): 3 mL via INTRAVENOUS

## 2014-02-03 MED ORDER — NITROGLYCERIN 0.4 MG SL SUBL: 0.4000 mg | SUBLINGUAL_TABLET | SUBLINGUAL | Status: DC | PRN

## 2014-02-03 MED ORDER — MORPHINE SULFATE 2 MG/ML IJ SOLN: 1.0000 mg | INTRAMUSCULAR | Status: DC | PRN

## 2014-02-03 MED ORDER — ASPIRIN 81 MG PO CHEW
243.0000 mg | CHEWABLE_TABLET | Freq: Once | ORAL | Status: AC
  Administered 2014-02-03: 243 mg via ORAL

## 2014-02-03 MED ORDER — MORPHINE SULFATE 4 MG/ML IJ SOLN
4.0000 mg | Freq: Once | INTRAMUSCULAR | Status: AC
  Administered 2014-02-03: 4 mg via INTRAVENOUS
  Filled 2014-02-03: qty 1

## 2014-02-03 MED ORDER — DOXYLAMINE SUCCINATE (SLEEP) 25 MG PO TABS: 25.0000 mg | ORAL_TABLET | Freq: Every evening | ORAL | Status: DC | PRN

## 2014-02-03 MED ORDER — ASPIRIN 81 MG PO CHEW
324.0000 mg | CHEWABLE_TABLET | Freq: Once | ORAL | Status: DC
  Filled 2014-02-03: qty 4

## 2014-02-03 MED ORDER — ENOXAPARIN SODIUM 40 MG/0.4ML ~~LOC~~ SOLN
40.0000 mg | Freq: Every day | SUBCUTANEOUS | Status: DC
  Administered 2014-02-03 – 2014-02-04 (×2): 40 mg via SUBCUTANEOUS
  Filled 2014-02-03 (×3): qty 0.4

## 2014-02-03 MED ORDER — NITROGLYCERIN 0.4 MG SL SUBL
0.4000 mg | SUBLINGUAL_TABLET | SUBLINGUAL | Status: DC | PRN
  Administered 2014-02-03 (×2): 0.4 mg via SUBLINGUAL
  Filled 2014-02-03: qty 25

## 2014-02-03 MED ORDER — CARVEDILOL 12.5 MG PO TABS
12.5000 mg | ORAL_TABLET | Freq: Two times a day (BID) | ORAL | Status: DC
  Administered 2014-02-04 – 2014-02-05 (×3): 12.5 mg via ORAL
  Filled 2014-02-03 (×5): qty 1

## 2014-02-03 MED ORDER — ADULT MULTIVITAMIN W/MINERALS CH
1.0000 | ORAL_TABLET | Freq: Every day | ORAL | Status: DC
  Administered 2014-02-04 – 2014-02-05 (×2): 1 via ORAL
  Filled 2014-02-03 (×2): qty 1

## 2014-02-03 MED ORDER — FERROUS SULFATE 325 (65 FE) MG PO TABS
325.0000 mg | ORAL_TABLET | Freq: Every day | ORAL | Status: DC
  Administered 2014-02-04 – 2014-02-05 (×2): 325 mg via ORAL
  Filled 2014-02-03 (×3): qty 1

## 2014-02-03 MED ORDER — ONDANSETRON HCL 4 MG/2ML IJ SOLN
4.0000 mg | Freq: Once | INTRAMUSCULAR | Status: AC
  Administered 2014-02-03: 4 mg via INTRAVENOUS
  Filled 2014-02-03: qty 2

## 2014-02-03 MED ORDER — ASPIRIN EC 81 MG PO TBEC
81.0000 mg | DELAYED_RELEASE_TABLET | Freq: Every day | ORAL | Status: DC
  Administered 2014-02-04 – 2014-02-05 (×2): 81 mg via ORAL
  Filled 2014-02-03 (×2): qty 1

## 2014-02-03 MED ORDER — SODIUM CHLORIDE 0.9 % IV SOLN: 250.0000 mL | INTRAVENOUS | Status: DC | PRN

## 2014-02-03 MED ORDER — ACETAMINOPHEN 325 MG PO TABS: 650.0000 mg | ORAL_TABLET | Freq: Four times a day (QID) | ORAL | Status: DC | PRN

## 2014-02-03 MED ORDER — FUROSEMIDE 40 MG PO TABS
40.0000 mg | ORAL_TABLET | Freq: Every day | ORAL | Status: DC
  Administered 2014-02-04: 40 mg via ORAL
  Filled 2014-02-03: qty 1

## 2014-02-03 MED ORDER — SODIUM CHLORIDE 0.9 % IJ SOLN
3.0000 mL | Freq: Two times a day (BID) | INTRAMUSCULAR | Status: DC
  Administered 2014-02-04: 3 mL via INTRAVENOUS

## 2014-02-03 MED ORDER — SODIUM CHLORIDE 0.9 % IJ SOLN: 3.0000 mL | INTRAMUSCULAR | Status: DC | PRN

## 2014-02-03 MED ORDER — METOPROLOL SUCCINATE 12.5 MG HALF TABLET
12.5000 mg | ORAL_TABLET | Freq: Every day | ORAL | Status: DC
  Administered 2014-02-04: 12.5 mg via ORAL
  Filled 2014-02-03: qty 1

## 2014-02-03 MED ORDER — ZOLPIDEM TARTRATE 5 MG PO TABS
5.0000 mg | ORAL_TABLET | Freq: Every evening | ORAL | Status: DC | PRN
  Administered 2014-02-04 (×2): 5 mg via ORAL
  Filled 2014-02-03 (×2): qty 1

## 2014-02-03 MED ORDER — POTASSIUM CHLORIDE CRYS ER 20 MEQ PO TBCR
20.0000 meq | EXTENDED_RELEASE_TABLET | Freq: Every day | ORAL | Status: DC
  Administered 2014-02-04 – 2014-02-05 (×2): 20 meq via ORAL
  Filled 2014-02-03 (×2): qty 1

## 2014-02-03 MED ORDER — TRAMADOL HCL 50 MG PO TABS: 50.0000 mg | ORAL_TABLET | Freq: Four times a day (QID) | ORAL | Status: DC | PRN

## 2014-02-03 MED ORDER — ACETAMINOPHEN 650 MG RE SUPP: 650.0000 mg | Freq: Four times a day (QID) | RECTAL | Status: DC | PRN

## 2014-02-03 NOTE — ED Notes (Signed)
Chest pain started Monday, persisted intermittently, worsened today with "tightness and sob, dizziness and sweating", also c/o left arm pain for several weeks, described as a "real hard numb pain".

## 2014-02-03 NOTE — ED Notes (Signed)
Paged [employer]  

## 2014-02-03 NOTE — Consult Note (Signed)
Reason for Consult: chest pain  Chief Complaint: chest pain Primary Cardiologist: Dr. Caryl Comes  Referring Physician: Dr. Clyda Greener Sydney Shaffer is an 48 y.o. female.  HPI: Sydney Shaffer is a 48 yo woman with PMH of dilated cardiomyopathy with last known EF 20-25% 4/13, ICD placement 4/13, atrial arrhythmia and AVNRT ablation 1/14, LHC '13 with clean coronaries who presents with chest pain. Cardiology consulted given significant cardiac history. She states her CP is substernal, central and intermittent over the last 3-4 days as bad as 7-8/10. She says her pain is similar to when she was diagnosed with HF in 2013. The chest pressure comes on more with walking. She tells me until the pain came on she could probably walk a mile before DOE and now probably 1/4 - 1/2 mile. NTG in the ER provided some improvement in symptoms in the ER. She currently is CP free.    Past Medical History  Diagnosis Date  . CHF (congestive heart failure)     Dx 03/2012 - dilated cardiomyopathy with EF 20-25% by echo (abnl nuc but normal coronaries 04/02/12 per cath.   . Anemia     felt to be due to heavy menstrual flow  . Chronic systolic heart failure   . Hypertensive heart disease with CHF   . Hypertension   . Dysrhythmia     Bradycardia  . 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 Atrial tachycardia     s/p ablation  . 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 Tubal ligation    . Appendectomy    . Cardiac catheterization  April 2013    normal coronaries  . Icd  08/13/2012  . Ep study and ablation  01/07/13    Ablation of AVNRT and atrial tachycardia (arising from the anteroseptal RA 35m above the HIS)    Family History  Problem Relation Age of Onset  . Breast cancer Mother   . Cancer Father     Social History:  reports that she has never smoked. She has never used smokeless tobacco. She reports that she drinks about  1.2 ounces of alcohol per week. She reports that she does not use illicit drugs.  Allergies: No Known Allergies  Medications: I have reviewed the patient's current medications. Prior to Admission:  (Not in a hospital admission) Scheduled:   Results for orders placed during the hospital encounter of 02/03/14 (from the past 48 hour(s))  CBC     Status: Abnormal   Collection Time    02/03/14  6:20 PM      Result Value Ref Range   WBC 7.2  4.0 - 10.5 K/uL   RBC 3.94  3.87 - 5.11 MIL/uL   Hemoglobin 11.1 (*) 12.0 - 15.0 g/dL   HCT 33.1 (*) 36.0 - 46.0 %   MCV 84.0  78.0 - 100.0 fL   MCH 28.2  26.0 - 34.0 pg   MCHC 33.5  30.0 - 36.0 g/dL   RDW 15.1  11.5 - 15.5 %   Platelets 295  150 - 400 K/uL  BASIC METABOLIC PANEL     Status: Abnormal   Collection Time    02/03/14  6:20 PM      Result Value Ref Range   Sodium 142  137 - 147 mEq/L   Potassium 3.8  3.7 - 5.3 mEq/L   Chloride 104  96 - 112 mEq/L  CO2 27  19 - 32 mEq/L   Glucose, Bld 102 (*) 70 - 99 mg/dL   BUN 18  6 - 23 mg/dL   Creatinine, Ser 0.92  0.50 - 1.10 mg/dL   Calcium 9.2  8.4 - 10.5 mg/dL   GFR calc non Af Amer 72 (*) >90 mL/min   GFR calc Af Amer 84 (*) >90 mL/min   Comment: (NOTE)     The eGFR has been calculated using the CKD EPI equation.     This calculation has not been validated in all clinical situations.     eGFR's persistently <90 mL/min signify possible Chronic Kidney     Disease.  Randolm Idol, ED     Status: None   Collection Time    02/03/14  6:35 PM      Result Value Ref Range   Troponin i, poc 0.01  0.00 - 0.08 ng/mL   Comment 3            Comment: Due to the release kinetics of cTnI,     a negative result within the first hours     of the onset of symptoms does not rule out     myocardial infarction with certainty.     If myocardial infarction is still suspected,     repeat the test at appropriate intervals.  PRO B NATRIURETIC PEPTIDE     Status: Abnormal   Collection Time    02/03/14   6:51 PM      Result Value Ref Range   Pro B Natriuretic peptide (BNP) 305.2 (*) 0 - 125 pg/mL    Dg Chest 2 View  02/03/2014   CLINICAL DATA:  Shortness of breath, chest pain  EXAM: CHEST - 2 VIEW  COMPARISON:  08/14/2012  FINDINGS: Left subclavian AICD stable. Lungs clear. Heart size upper limits normal. . No pneumothorax. No effusion. Visualized skeletal structures are unremarkable.  IMPRESSION: No acute cardiopulmonary disease.   Electronically Signed   By: Arne Cleveland M.D.   On: 02/03/2014 19:57    Review of Systems  Constitutional: Positive for malaise/fatigue. Negative for fever, chills and weight loss.  HENT: Negative for ear pain.   Eyes: Negative for double vision and pain.  Respiratory: Positive for shortness of breath. Negative for cough and hemoptysis.   Cardiovascular: Positive for chest pain and leg swelling. Negative for palpitations.  Gastrointestinal: Negative for nausea, vomiting and abdominal pain.  Genitourinary: Negative for dysuria, frequency and hematuria.  Musculoskeletal: Negative for myalgias and neck pain.  Skin: Negative for rash.  Neurological: Negative for dizziness, tingling and headaches.  Endo/Heme/Allergies: Negative for polydipsia. Does not bruise/bleed easily.  Psychiatric/Behavioral: Negative for depression and suicidal ideas.   Blood pressure 119/61, pulse 79, temperature 98.4 F (36.9 C), temperature source Oral, resp. rate 19, last menstrual period 01/09/2014, SpO2 100.00%. Physical Exam  Nursing note and vitals reviewed. Constitutional: She is oriented to person, place, and time. She appears well-developed and well-nourished. No distress.  HENT:  Head: Normocephalic and atraumatic.  Nose: Nose normal.  Mouth/Throat: Oropharynx is clear and moist. No oropharyngeal exudate.  Eyes: Conjunctivae and EOM are normal. Pupils are equal, round, and reactive to light. No scleral icterus.  Neck: Normal range of motion. Neck supple. JVD present. No  thyromegaly present.  JVP 3 cm above clavicle with slight HJR  Cardiovascular: Normal rate, regular rhythm and intact distal pulses.  Exam reveals no gallop.   No murmur heard. Respiratory: Effort normal and breath sounds normal.  No respiratory distress. She has no wheezes. She has no rales.  GI: Soft. Bowel sounds are normal. She exhibits no distension. There is no tenderness. There is no rebound.  Musculoskeletal: Normal range of motion. She exhibits edema. She exhibits no tenderness.  Trace LEE bilaterally  Neurological: She is alert and oriented to person, place, and time. No cranial nerve deficit. Coordination normal.  Skin: Skin is warm and dry. No rash noted. She is not diaphoretic. No erythema.  Psychiatric: She has a normal mood and affect. Her behavior is normal.   Labs reviewed; na 142, K 3.8, bun/cxr 18/0.92, glucose 102, Trop 0.01, proBNP 305 Wbc 72, h/h 11.1/33.3 EKG: poor r-wave progression, IRBB, NSR, nonspecific ST changes inf/lateral but improved compared with 10/14 ECG Chest x-ray: dual-chamber ICD, no acute process  Problem List Chest Pain Nonischemic cardiomyopathy s/p ICD '13 Atrial arrhythmias and AVNRT s/p ablation 1/14  Assessment/Plan: 48 yo woman with PMH of nonischemic cardiomyopathy, atrial arrhythmias/AVNRT with ablations, anemia here with chest pain. Differential diagnosis is atypical, ACS/NSTEMI, unstable angina, HF related, microvascular ischemia, among other etiologies. Given clean coronary arteries as recently as 2013, I favor a microvascular ischemia or HF. She has mildly elevated JVP. I will write for 40 mg IV lasix x1, trend cardiac markers, observe overnight and consider stress testing in AM vs. Review new echocardiogram.  - trend cardiac biomarkers, telemetry - large aspirin - agree with echocardiogram in AM; consider stress testing based on findings - continue coreg and metoprolol XL as this is historical in multiple prior notes - restart  lisinopril 5 mg - plan to gradually restart spironolactone after lisinopril    Sydney Shaffer, Sydney Shaffer 02/03/2014, 9:46 PM

## 2014-02-03 NOTE — ED Notes (Signed)
Accidentally clicked off urine rapid drug screen on pt, still need to collect. Will inform floor RN.

## 2014-02-03 NOTE — ED Notes (Signed)
Informed floor RN, urine still needs to be collected, pt aware as well. Requisite sent with pt to floor.

## 2014-02-03 NOTE — ED Provider Notes (Signed)
CSN: [id number]     Arrival date & time 02/03/14  1752 History   First MD Initiated Contact with Patient 02/03/14 1827     Chief Complaint  Patient presents with  . 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  Pt has been having intermittent chest pressure for a week.  Today symptoms started at 12 PM.  She describes central chest pressure while lifting boxes at work, nonradiating, constant, not relieved by rest, with associated diaphoresis, SOB, lightheadedness.  She also states her left arm hurts, especially with ROM, however this has been going on for 3 weeks now.  She denies any other symptoms.  Pt took her Rx medications today including a baby ASA.  Pt states last cardiac cath, stress where in 2013.    The history is provided by the patient. No language interpreter was used.    Past Medical History  Diagnosis Date  . CHF (congestive heart failure)     Dx 03/2012 - dilated cardiomyopathy with EF 20-25% by echo (abnl nuc but normal coronaries 04/02/12 per cath.   . Anemia     felt to be due to heavy menstrual flow  . Chronic systolic heart failure   . Hypertensive heart disease with CHF   . Hypertension   . Dysrhythmia     Bradycardia  . 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 Atrial tachycardia     s/p ablation  . 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 Tubal ligation    . Appendectomy    . Cardiac catheterization  April 2013    normal coronaries  . Icd  08/13/2012  . Ep study and ablation  01/07/13    Ablation of AVNRT and atrial tachycardia (arising from the anteroseptal RA 20mm above the HIS)   Family History  Problem Relation Age of Onset  . Breast cancer Mother   . Cancer Father    History  Substance Use Topics  . Smoking  status: Never Smoker   . Smokeless tobacco: Never Used  . Alcohol Use: 1.2 oz/week    2 Glasses of wine per week     Comment: daily   OB History   Grav Para Term Preterm Abortions TAB SAB Ect Mult Living                 Review of Systems  Constitutional: Positive for diaphoresis. Negative for fever and chills.  Respiratory: Positive for shortness of breath. Negative for cough, wheezing and stridor.   Cardiovascular: Positive for chest pain. Negative for palpitations and leg swelling.  Gastrointestinal: Negative for nausea, vomiting, abdominal pain, diarrhea and constipation.  Musculoskeletal: Negative for myalgias.  Skin: Negative for rash.  Neurological: Positive for light-headedness. Negative for dizziness, weakness, numbness and headaches.  Hematological: Negative for adenopathy. Does not bruise/bleed easily.  All other systems reviewed and are negative.      Allergies  Review of patient's allergies indicates no known allergies.  Home Medications   Current Outpatient Rx  Name  Route  Sig  Dispense  Refill  . aspirin EC 81 MG tablet   Oral   Take 81 mg by mouth daily.         . carvedilol (COREG) 25 MG tablet  TAKE ONE-HALF TABLET BY MOUTH TWICE DAILY   30 tablet   3   . cephALEXin (KEFLEX) 500 MG capsule   Oral   Take 1 capsule (500 mg total) by mouth 2 (two) times daily.   10 capsule   0   . Doxylamine Succinate, Sleep, (SLEEP AID PO)   Oral   Take 1 tablet by mouth at bedtime as needed. For sleep.         . ferrous sulfate 325 (65 FE) MG tablet   Oral   Take 325 mg by mouth daily with breakfast.         . furosemide (LASIX) 40 MG tablet   Oral   Take 1 tablet (40 mg total) by mouth daily.   30 tablet   5   . metoprolol succinate (TOPROL-XL) 25 MG 24 hr tablet      TAKE ONE-HALF TABLET BY MOUTH EVERY DAY   30 tablet   0   . metoprolol succinate (TOPROL-XL) 50 MG 24 hr tablet   Oral   Take 50 mg by mouth daily. Take with or  immediately following a meal.         . metoprolol succinate (TOPROL-XL) 50 MG 24 hr tablet      TAKE ONE TABLET BY MOUTH EVERY DAY   90 tablet   2   . Multiple Vitamin (MULTIVITAMIN WITH MINERALS) TABS   Oral   Take 1 tablet by mouth daily.         . potassium chloride SA (K-DUR,KLOR-CON) 20 MEQ tablet   Oral   Take 1 tablet (20 mEq total) by mouth daily.   30 tablet   5   . spironolactone (ALDACTONE) 25 MG tablet      TAKE ONE-HALF TABLET BY MOUTH ONCE DAILY   45 tablet   3   . traMADol (ULTRAM) 50 MG tablet   Oral   Take 1 tablet (50 mg total) by mouth every 6 (six) hours as needed for pain.   15 tablet   0    BP 128/74  Pulse 80  Temp(Src) 98.4 F (36.9 C) (Oral)  Resp 14  SpO2 99%  LMP 01/09/2014 Physical Exam  Nursing note and vitals reviewed. Constitutional: She is oriented to person, place, and time. She appears well-developed and well-nourished.  HENT:  Head: Normocephalic and atraumatic.  Right Ear: External ear normal.  Left Ear: External ear normal.  Nose: Nose normal.  Mouth/Throat: Oropharynx is clear and moist.  Eyes: Conjunctivae and EOM are normal. Pupils are equal, round, and reactive to light.  Neck: Normal range of motion. Neck supple.  Cardiovascular: Normal rate, regular rhythm and intact distal pulses.  Exam reveals no gallop and no friction rub.   No murmur heard. Pulmonary/Chest: Effort normal and breath sounds normal. No respiratory distress. She has no wheezes. She has no rales. She exhibits no tenderness.  Abdominal: Soft. Bowel sounds are normal. She exhibits no distension and no mass. There is no tenderness. There is no rebound and no guarding.  Musculoskeletal: Normal range of motion.  Neurological: She is alert and oriented to person, place, and time.  Skin: Skin is warm and dry.    ED Course  Procedures (including critical care time) Labs Review Labs Reviewed  CBC - Abnormal; Notable for the following:    Hemoglobin  11.1 (*)    HCT 33.1 (*)    All other components within normal limits  BASIC METABOLIC PANEL - Abnormal; Notable for the  following:    Glucose, Bld 102 (*)    GFR calc non Af Amer 72 (*)    GFR calc Af Amer 84 (*)    All other components within normal limits  PRO B NATRIURETIC PEPTIDE - Abnormal; Notable for the following:    Pro B Natriuretic peptide (BNP) 305.2 (*)    All other components within normal limits  MRSA PCR SCREENING  TROPONIN I  LIPID PANEL  TROPONIN I  TROPONIN I  URINE RAPID DRUG SCREEN (HOSP PERFORMED)  BASIC METABOLIC PANEL  CBC  I-STAT TROPOININ, ED   Imaging Review Dg Chest 2 View  02/03/2014   CLINICAL DATA:  Shortness of breath, chest pain  EXAM: CHEST - 2 VIEW  COMPARISON:  08/14/2012  FINDINGS: Left subclavian AICD stable. Lungs clear. Heart size upper limits normal. . No pneumothorax. No effusion. Visualized skeletal structures are unremarkable.  IMPRESSION: No acute cardiopulmonary disease.   Electronically Signed   By: Arne Cleveland M.D.   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   Filed Vitals:   02/03/14 1800  BP: 128/74  Pulse: 80  Temp: 98.4 F (36.9 C)  Resp: 14   Physical exam as above.  VS WNL.  Pt c/o of central and left sided CP currently 6/10.  Pt received 2 SL Nitro without any relief.  Morphine added with slight improvement, but continued pain.  ASA given.  EKG NSR, incomplete RBBB,  Nonspecific T changes, prolonged QT (QTc 512), no ST elevation or depression.  CXR WNL, no acute cardiopulmonary findings.  Tropnin < 0.30.  BNP 305.2, similar to previous.  CBC shows mild normocytic anemia, similar to previous.  BMP WNL.    Given on going CP, and cardiac hx, decision to bring pt into hospital.  Pt to be admitted to  medicine service.  Cardiology consulted.  Pt understands and agrees with plan.  Pt's care plan discussed with Dr. Jeneen Rinks.  Sinda Du, MD     Sinda Du, MD 02/04/14 [phone]

## 2014-02-03 NOTE — ED Notes (Signed)
Presents with a couple day of intermittent chest tightness last episode occurred at 17:30 with radiation into left arm associated with SOB, dizziness, diaphoresis and "hot feeling". Denies nausea. Last episode lasted longer than previous episodes. Usually lasts about 5-10 minutes today lasted a lot longer. Nothing makes pain worse. Rest makes pain better.

## 2014-02-03 NOTE — H&P (Signed)
[facility] History and Physical  Sydney Shaffer [id number] DOB: 02-02-1966 DOA: 02/03/2014  Referring physician: Dr Justin Mend.  PCP: Default, Provider, MD   Chief Complaint: Chest pressure.   HPI: Sydney Shaffer is a 48 y.o. female with PMH significant for Non ischemic Cardiomyopathy, Last EF 25 % by ECHO 2013, SP ICD placement 2013, who presents to ED complaining of chest  Pain, middle of chest, pressure in quality that started 4 days prior to admission, intermittent, 7/10 in intensity. The chest pain got worse today on exertion. She has been taking her medications. The only medication that she ran out was metoprolol. She received nitroglycerin, in the ED which relieved some of the pressure. This chest pain episode is similar to episode she had on 2013 when she was diagnosed with cardiomyopathy.    Review of Systems:  Negative, except as per HPI.   Past Medical History  Diagnosis Date  . CHF (congestive heart failure)     Dx 03/2012 - dilated cardiomyopathy with EF 20-25% by echo (abnl nuc but normal coronaries 04/02/12 per cath.   . Anemia     felt to be due to heavy menstrual flow  . Chronic systolic heart failure   . Hypertensive heart disease with CHF   . Hypertension   . Dysrhythmia     Bradycardia  . 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 Atrial tachycardia     s/p ablation  . 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 Tubal ligation    . Appendectomy    . Cardiac catheterization  April 2013    normal coronaries  . Icd  08/13/2012  . Ep study and ablation  01/07/13    Ablation of AVNRT and atrial tachycardia (arising from the anteroseptal RA 12mm above the HIS)   Social History:  reports that she has never smoked. She has never used smokeless tobacco. She reports that she drinks about 1.2 ounces of alcohol per week. She reports that she does not use illicit drugs.  No Known  Allergies  Family History  Problem Relation Age of Onset  . Breast cancer Mother   . Cancer Father      Prior to Admission medications   Medication Sig Start Date End Date Taking? Authorizing Provider  aspirin EC 81 MG tablet Take 81 mg by mouth daily.    Historical Provider, MD  carvedilol (COREG) 25 MG tablet TAKE ONE-HALF TABLET BY MOUTH TWICE DAILY 06/23/13   Deboraha Sprang, MD  cephALEXin (KEFLEX) 500 MG capsule Take 1 capsule (500 mg total) by mouth 2 (two) times daily. 08/27/13   Jacqualyn Posey, NP  Doxylamine Succinate, Sleep, (SLEEP AID PO) Take 1 tablet by mouth at bedtime as needed. For sleep.    Historical Provider, MD  ferrous sulfate 325 (65 FE) MG tablet Take 325 mg by mouth daily with breakfast.    Historical Provider, MD  furosemide (LASIX) 40 MG tablet Take 1 tablet (40 mg total) by mouth daily. 06/15/13   Deboraha Sprang, MD  metoprolol succinate (TOPROL-XL) 25 MG 24 hr tablet TAKE ONE-HALF TABLET BY MOUTH EVERY DAY 03/27/13   Deboraha Sprang, MD  metoprolol succinate (TOPROL-XL) 50 MG 24 hr tablet Take 50 mg by mouth daily. Take with or immediately following a meal.    Historical Provider, MD  metoprolol succinate (TOPROL-XL) 50 MG 24 hr tablet TAKE ONE TABLET BY MOUTH EVERY  DAY 11/28/13   Deboraha Sprang, MD  Multiple Vitamin (MULTIVITAMIN WITH MINERALS) TABS Take 1 tablet by mouth daily.    Historical Provider, MD  potassium chloride SA (K-DUR,KLOR-CON) 20 MEQ tablet Take 1 tablet (20 mEq total) by mouth daily. 05/18/13   Thompson Grayer, MD  spironolactone (ALDACTONE) 25 MG tablet TAKE ONE-HALF TABLET BY MOUTH ONCE DAILY 09/19/13   Deboraha Sprang, MD  traMADol (ULTRAM) 50 MG tablet Take 1 tablet (50 mg total) by mouth every 6 (six) hours as needed for pain. 08/27/13   Jacqualyn Posey, NP   Physical Exam: Filed Vitals:   02/03/14 2030  BP: 115/66  Pulse: 73  Temp:   Resp: 23    BP 115/66  Pulse 73  Temp(Src) 98.4 F (36.9 C) (Oral)  Resp 23  SpO2 100%  LMP  01/09/2014  General:  Appears calm and comfortable Eyes: PERRL, normal lids, irises & conjunctiva ENT: grossly normal hearing, lips & tongue Neck: no LAD, masses or thyromegaly Cardiovascular: RRR, no m/r/g. No LE edema. Telemetry: SR, no arrhythmias  Respiratory: CTA bilaterally, no w/r/r. Normal respiratory effort. Abdomen: soft, ntnd Skin: no rash or induration seen on limited exam Musculoskeletal: grossly normal tone BUE/BLE Psychiatric: grossly normal mood and affect, speech fluent and appropriate Neurologic: grossly non-focal.          Labs on Admission:  Basic Metabolic Panel:  Recent Labs Lab 02/03/14 1820  NA 142  K 3.8  CL 104  CO2 27  GLUCOSE 102*  BUN 18  CREATININE 0.92  CALCIUM 9.2   Liver Function Tests: No results found for this basename: AST, ALT, ALKPHOS, BILITOT, PROT, ALBUMIN,  in the last 168 hours No results found for this basename: LIPASE, AMYLASE,  in the last 168 hours No results found for this basename: AMMONIA,  in the last 168 hours CBC:  Recent Labs Lab 02/03/14 1820  WBC 7.2  HGB 11.1*  HCT 33.1*  MCV 84.0  PLT 295   Cardiac Enzymes: No results found for this basename: CKTOTAL, CKMB, CKMBINDEX, TROPONINI,  in the last 168 hours  BNP (last 3 results)  Recent Labs  02/03/14 1851  PROBNP 305.2*   CBG: No results found for this basename: GLUCAP,  in the last 168 hours  Radiological Exams on Admission: Dg Chest 2 View  02/03/2014   CLINICAL DATA:  Shortness of breath, chest pain  EXAM: CHEST - 2 VIEW  COMPARISON:  08/14/2012  FINDINGS: Left subclavian AICD stable. Lungs clear. Heart size upper limits normal. . No pneumothorax. No effusion. Visualized skeletal structures are unremarkable.  IMPRESSION: No acute cardiopulmonary disease.   Electronically Signed   By: Arne Cleveland M.D.   On: 02/03/2014 19:57    EKG: Independently reviewed. Sinus Rhythm, Incomplete RBBB.   Assessment/Plan Active Problems:   Chronic systolic  heart failure   Nonischemic dilated cardiomyopathy   Hypertensive heart disease with CHF (congestive heart failure)   Implantable defibrillator-[employer]   Chest pain  1-Chest pain, angina; Chest x ray negative, first set troponin negative, EKG sinus rhythm, BNP not significant elevated at 305.  Admit to step down unit. Continue with nitroglycerin, morphine, coreg, aspirin. Cardiology consulted. Will defer to cardio start heparin. Will order ECHO cardiogram. Cycle cardiac enzymes. Might need long acting nitrates if blood pressure tolerates.   2-Cardiomyopathy, EF 25 %; continue with lasix. She is not taking spironolactone. She will probably need to be started on ACE. Continue with metoprolol, and coreg. Patient relates her cardiologist  put her on both.   3-Anemia; continue with ferrous sulfate.   Code Status: Full Code.  Family Communication: Care discussed with patient.  Disposition Plan:  Expect less than 2 nights.   Time spent: 75 minutes,.   [facility] Pager [phone]

## 2014-02-03 NOTE — ED Notes (Signed)
Dr. Webb at bedside

## 2014-02-03 NOTE — ED Notes (Signed)
Spoke with a representative at [employer] about interrogated pt's pacemaker.

## 2014-02-03 NOTE — ED Notes (Addendum)
[employer] states they do not interrogate pt's pacemaker as they only manufacture one of the pt's leads.

## 2014-02-03 NOTE — ED Notes (Signed)
Pt states she does not want any more nitroglycerin.

## 2014-02-04 ENCOUNTER — Encounter (HOSPITAL_COMMUNITY): Payer: Self-pay

## 2014-02-04 ENCOUNTER — Inpatient Hospital Stay (HOSPITAL_COMMUNITY): Payer: No Typology Code available for payment source

## 2014-02-04 DIAGNOSIS — I110 Hypertensive heart disease with heart failure: Secondary | ICD-10-CM

## 2014-02-04 DIAGNOSIS — I509 Heart failure, unspecified: Secondary | ICD-10-CM

## 2014-02-04 DIAGNOSIS — I369 Nonrheumatic tricuspid valve disorder, unspecified: Secondary | ICD-10-CM

## 2014-02-04 DIAGNOSIS — I428 Other cardiomyopathies: Secondary | ICD-10-CM

## 2014-02-04 LAB — CBC
HCT: 31.1 % — ABNORMAL LOW (ref 36.0–46.0)
HEMOGLOBIN: 10.5 g/dL — AB (ref 12.0–15.0)
MCH: 28.4 pg (ref 26.0–34.0)
MCHC: 33.8 g/dL (ref 30.0–36.0)
MCV: 84.1 fL (ref 78.0–100.0)
Platelets: 268 10*3/uL (ref 150–400)
RBC: 3.7 MIL/uL — ABNORMAL LOW (ref 3.87–5.11)
RDW: 15.3 % (ref 11.5–15.5)
WBC: 6.3 10*3/uL (ref 4.0–10.5)

## 2014-02-04 LAB — RAPID URINE DRUG SCREEN, HOSP PERFORMED
Amphetamines: NOT DETECTED
BARBITURATES: NOT DETECTED
BENZODIAZEPINES: NOT DETECTED
Cocaine: NOT DETECTED
OPIATES: POSITIVE — AB
Tetrahydrocannabinol: NOT DETECTED

## 2014-02-04 LAB — BASIC METABOLIC PANEL
BUN: 20 mg/dL (ref 6–23)
CO2: 26 mEq/L (ref 19–32)
CREATININE: 0.82 mg/dL (ref 0.50–1.10)
Calcium: 9.3 mg/dL (ref 8.4–10.5)
Chloride: 101 mEq/L (ref 96–112)
GFR calc Af Amer: >90 mL/min (ref >90)
GFR, EST NON AFRICAN AMERICAN: 83 mL/min — AB (ref >90)
GLUCOSE: 122 mg/dL — AB (ref 70–99)
Potassium: 3.4 mEq/L — ABNORMAL LOW (ref 3.7–5.3)
Sodium: 139 mEq/L (ref 137–147)

## 2014-02-04 LAB — MRSA PCR SCREENING: MRSA by PCR: NEGATIVE

## 2014-02-04 LAB — TROPONIN I
Troponin I: <0.3 ng/mL (ref <0.30)
Troponin I: <0.3 ng/mL (ref <0.30)

## 2014-02-04 MED ORDER — LISINOPRIL 5 MG PO TABS
5.0000 mg | ORAL_TABLET | Freq: Every day | ORAL | Status: DC
  Administered 2014-02-04 – 2014-02-05 (×2): 5 mg via ORAL
  Filled 2014-02-04 (×2): qty 1

## 2014-02-04 MED ORDER — IOHEXOL 350 MG/ML SOLN
80.0000 mL | Freq: Once | INTRAVENOUS | Status: AC | PRN
  Administered 2014-02-04: 70 mL via INTRAVENOUS

## 2014-02-04 MED ORDER — FUROSEMIDE 40 MG PO TABS
40.0000 mg | ORAL_TABLET | Freq: Two times a day (BID) | ORAL | Status: DC
  Administered 2014-02-04 – 2014-02-05 (×2): 40 mg via ORAL
  Filled 2014-02-04 (×4): qty 1

## 2014-02-04 MED ORDER — SPIRONOLACTONE 12.5 MG HALF TABLET
12.5000 mg | ORAL_TABLET | Freq: Every day | ORAL | Status: DC
  Administered 2014-02-04 – 2014-02-05 (×2): 12.5 mg via ORAL
  Filled 2014-02-04 (×2): qty 1

## 2014-02-04 MED ORDER — POTASSIUM CHLORIDE CRYS ER 20 MEQ PO TBCR
40.0000 meq | EXTENDED_RELEASE_TABLET | Freq: Once | ORAL | Status: AC
  Administered 2014-02-04: 40 meq via ORAL
  Filled 2014-02-04: qty 2

## 2014-02-04 MED ORDER — DOXYLAMINE SUCCINATE (SLEEP) 25 MG PO TABS
25.0000 mg | ORAL_TABLET | Freq: Every evening | ORAL | Status: DC | PRN
  Filled 2014-02-04: qty 1

## 2014-02-04 MED ORDER — FUROSEMIDE 10 MG/ML IJ SOLN
40.0000 mg | Freq: Once | INTRAMUSCULAR | Status: AC
  Administered 2014-02-04: 40 mg via INTRAVENOUS
  Filled 2014-02-04: qty 4

## 2014-02-04 NOTE — Progress Notes (Signed)
Patient ID: Sydney Shaffer, female   DOB: August 10, 1966, 48 y.o.   MRN: [id number]   SUBJECTIVE: Patient feels much better this morning.  No further chest pain.  No dyspnea.   Scheduled Meds: . aspirin EC  81 mg Oral Daily  . carvedilol  12.5 mg Oral BID WC  . enoxaparin (LOVENOX) injection  40 mg Subcutaneous QHS  . ferrous sulfate  325 mg Oral Q breakfast  . furosemide  40 mg Oral BID  . lisinopril  5 mg Oral Daily  . multivitamin with minerals  1 tablet Oral Daily  . potassium chloride SA  20 mEq Oral Daily  . sodium chloride  3 mL Intravenous Q12H  . sodium chloride  3 mL Intravenous Q12H  . spironolactone  12.5 mg Oral Daily   Continuous Infusions:  PRN Meds:.sodium chloride, acetaminophen, acetaminophen, morphine injection, nitroGLYCERIN, sodium chloride, traMADol, zolpidem    Filed Vitals:   02/04/14 0200 02/04/14 0400 02/04/14 0500 02/04/14 0822  BP: 112/63 117/73  117/57  Pulse: 66 69 68 80  Temp:   98.1 F (36.7 C) 98.5 F (36.9 C)  TempSrc:   Oral Oral  Resp: 16 16 17    Height:      Weight:   197 lb 12 oz (89.7 kg)   SpO2: 100% 99% 100% 100%    Intake/Output Summary (Last 24 hours) at 02/04/14 1110 Last data filed at 02/04/14 0500  Gross per 24 hour  Intake    240 ml  Output   2500 ml  Net  -2260 ml    LABS: Basic Metabolic Panel:  Recent Labs  02/03/14 1820 02/04/14 0314  NA 142 139  K 3.8 3.4*  CL 104 101  CO2 27 26  GLUCOSE 102* 122*  BUN 18 20  CREATININE 0.92 0.82  CALCIUM 9.2 9.3   Liver Function Tests: No results found for this basename: AST, ALT, ALKPHOS, BILITOT, PROT, ALBUMIN,  in the last 72 hours No results found for this basename: LIPASE, AMYLASE,  in the last 72 hours CBC:  Recent Labs  02/03/14 1820 02/04/14 0314  WBC 7.2 6.3  HGB 11.1* 10.5*  HCT 33.1* 31.1*  MCV 84.0 84.1  PLT 295 268   Cardiac Enzymes:  Recent Labs  02/03/14 2256 02/04/14 0314 02/04/14 0918  TROPONINI <0.30 <0.30 <0.30   BNP: No components  found with this basename: POCBNP,  D-Dimer: No results found for this basename: DDIMER,  in the last 72 hours Hemoglobin A1C: No results found for this basename: HGBA1C,  in the last 72 hours Fasting Lipid Panel:  Recent Labs  02/03/14 2256  CHOL 151  HDL 86  LDLCALC 51  TRIG 69  CHOLHDL 1.8   Thyroid Function Tests: No results found for this basename: TSH, T4TOTAL, FREET3, T3FREE, THYROIDAB,  in the last 72 hours Anemia Panel: No results found for this basename: VITAMINB12, FOLATE, FERRITIN, TIBC, IRON, RETICCTPCT,  in the last 72 hours  RADIOLOGY: Dg Chest 2 View  02/03/2014   CLINICAL DATA:  Shortness of breath, chest pain  EXAM: CHEST - 2 VIEW  COMPARISON:  08/14/2012  FINDINGS: Left subclavian AICD stable. Lungs clear. Heart size upper limits normal. . No pneumothorax. No effusion. Visualized skeletal structures are unremarkable.  IMPRESSION: No acute cardiopulmonary disease.   Electronically Signed   By: Arne Cleveland M.D.   On: 02/03/2014 19:57   Ct Angio Chest Pe W/cm &/or Wo Cm  02/04/2014   CLINICAL DATA:  Chest pain, shortness of Breath  EXAM: CT ANGIOGRAPHY CHEST WITH CONTRAST  TECHNIQUE: Multidetector CT imaging of the chest was performed using the standard protocol during bolus administration of intravenous contrast. Multiplanar CT image reconstructions and MIPs were obtained to evaluate the vascular anatomy.  CONTRAST:  31mL OMNIPAQUE IOHEXOL 350 MG/ML SOLN  COMPARISON:  None.  FINDINGS: Sagittal images of the spine shows mild degenerative changes thoracic spine.  The study is of excellent technical quality. No pulmonary embolus is noted. No pericardial effusion. The visualized upper abdomen is unremarkable.  There is no mediastinal hematoma or adenopathy. Two leads cardiac pacemaker in place.  Images of the lung parenchyma shows no acute infiltrate or pulmonary edema. No focal consolidation. No pulmonary nodules. I  Review of the MIP images confirms the above findings.   IMPRESSION: 1. No pulmonary embolus. 2. No acute infiltrate or pulmonary edema. 3. No mediastinal hematoma or adenopathy.   Electronically Signed   By: Lahoma Crocker M.D.   On: 02/04/2014 10:50    PHYSICAL EXAM General: NAD Neck: No JVD, no thyromegaly or thyroid nodule.  Lungs: Clear to auscultation bilaterally with normal respiratory effort. CV: Nondisplaced PMI.  Heart regular S1/S2, no S3/S4, no murmur.  No peripheral edema.  No carotid bruit.  Normal pedal pulses.  Abdomen: Soft, nontender, no hepatosplenomegaly, no distention.  Neurologic: Alert and oriented x 3.  Psych: Normal affect. Extremities: No clubbing or cyanosis.   TELEMETRY: Reviewed telemetry pt in NSR  ASSESSMENT AND PLAN: 48 yo with nonischemic cardiomyopathy presented with atypical chest pain and increased exertional dyspnea.  1. Chest pain: Suspect noncardiac.  Had clean coronaries in 2013.  Negative cardiac enzymes, chest pain was atypical/not exertional.  She has had atypical chest pain before when volume overloaded.  CTA chest negative for PE.  I do not think that she needs further ischemic workup.  2. Acute on chronic systolic CHF: I think that the patient's symptoms (dyspnea, chest pain) were triggered by mild volume overload.  She feels much better today after getting IV Lasix. This morning, she does not look particularly volume overloaded.  - Can increase her home dose of po Lasix to 40 mg po bid.  Would increase KCl at home to 40 daily.  - Continue Coreg, lisinopril, and spironolactone at home doses for now.  - Does not need to be on Toprol XL when she is already on carvedilol.  - Echo was done, pending read.  3. Disposition: From my standpoint, I think that she can go home today.  She should continue her home meds but stop Toprol XL and increase Lasix to 40 mg bid.  She will need to increase KCl to 40 daily.  She should have followup within 1 week in CHF clinic.   Sydney Shaffer 02/04/2014 11:15 AM

## 2014-02-04 NOTE — Progress Notes (Signed)
  Echocardiogram 2D Echocardiogram has been performed.  Sydney Shaffer 02/04/2014, 9:56 AM

## 2014-02-04 NOTE — Progress Notes (Addendum)
[facility] Progress Note [place] TEAM 1 - Stepdown/ICU TEAM   Sydney Shaffer LFY:[id number] DOB: [birth year] DOA: 02/03/2014 PCP: Default, Provider, MD  Brief narrative: Sydney Shaffer is a 48 y.o. female presenting on 02/03/2014 with  has a past medical history of CHF - EF 25% on ECHO in 2013,  Anemia; Hypertensive heart disease,  ICD (implantable cardiac defibrillator) in place (08/13/2012); Atrial tachycardia; and AVNRT (AV nodal re-entry tachycardia) who presents with chest pressure.  Pain, middle of chest, pressure in quality that started 4 days prior to admission, intermittent, 7/10 in intensity. The chest pain got worse on exertion. She has been taking her medications but she ran out of metoprolol. She received nitroglycerin in the ED which relieved some of the pressure. This chest pain episode is similar to episode she had on 2013 when she was diagnosed with cardiomyopathy.    Subjective: Still having chest pressure in upper substernal area. Improved from yesterday.   Assessment/Plan: Principal Problem:   Chest pressure- unspecified- atypical - cardiology following with plans for ECHO today - obtain CT chest.   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 rate 17, height 5\' 7"  (1.702 m), weight 89.7 kg (197 lb 12 oz), last menstrual period 01/09/2014,  SpO2 100.00%.  Intake/Output Summary (Last 24 hours) at 02/04/14 0852 Last data filed at 02/04/14 0500  Gross per 24 hour  Intake    240 ml  Output   2500 ml  Net  -2260 ml     Exam: General: No acute respiratory distress Lungs: Clear to auscultation bilaterally without wheezes or crackles Chest: tenderness in chest wall in area of upper sternum Cardiovascular: Regular rate and rhythm without murmur gallop or rub normal S1 and S2 Abdomen: Nontender, nondistended, soft, bowel sounds positive, no rebound, no ascites, no appreciable mass Extremities: No significant cyanosis, clubbing, or edema bilateral lower extremities  Data Reviewed: Basic Metabolic Panel:  Recent Labs Lab 02/03/14 1820 02/04/14 0314  NA 142 139  K 3.8 3.4*  CL 104 101  CO2 27 26  GLUCOSE 102* 122*  BUN 18 20  CREATININE 0.92 0.82  CALCIUM 9.2 9.3   Liver Function Tests: No results found for this basename: AST, ALT, ALKPHOS, BILITOT, PROT, ALBUMIN,  in the last 168 hours No results found for this basename: LIPASE, AMYLASE,  in the last 168 hours No results found for this basename: AMMONIA,  in the last 168 hours CBC:  Recent Labs Lab 02/03/14 1820 02/04/14 0314  WBC 7.2 6.3  HGB 11.1* 10.5*  HCT 33.1* 31.1*  MCV 84.0 84.1  PLT 295 268   Cardiac Enzymes:  Recent Labs Lab 02/03/14 2256 02/04/14 0314  TROPONINI <0.30 <0.30   BNP (last 3 results)  Recent Labs  02/03/14 1851  PROBNP 305.2*   CBG: No results found for this basename: GLUCAP,  in  the last 168 hours  Recent Results (from the past 240 hour(s))  MRSA PCR SCREENING     Status: None   Collection Time    02/03/14 10:26 PM      Result Value Ref Range Status   MRSA by PCR NEGATIVE  NEGATIVE Final   Comment:            The GeneXpert MRSA Assay (FDA     approved for NASAL specimens     only), is one component of a     comprehensive MRSA colonization     surveillance program. It is not     intended to diagnose MRSA      infection nor to guide or     monitor treatment for     MRSA infections.     Studies:  Recent x-ray studies have been reviewed in detail by the Attending Physician  Scheduled Meds:  Scheduled Meds: . aspirin EC  81 mg Oral Daily  . carvedilol  12.5 mg Oral BID WC  . enoxaparin (LOVENOX) injection  40 mg Subcutaneous QHS  . ferrous sulfate  325 mg Oral Q breakfast  . furosemide  40 mg Oral Daily  . lisinopril  5 mg Oral Daily  . metoprolol succinate  12.5 mg Oral Daily  . multivitamin with minerals  1 tablet Oral Daily  . potassium chloride SA  20 mEq Oral Daily  . sodium chloride  3 mL Intravenous Q12H  . sodium chloride  3 mL Intravenous Q12H   Continuous Infusions:   Time spent on care of this patient: >35 min   Debbe Odea, MD  [facility] Office  [phone] Pager - Text Page per Shea Evans as per below:  On-Call/Text Page:      [email or web address]  If 7PM-7AM, please contact night-coverage [email or web address] 02/04/2014, 8:52 AM   LOS: 1 day

## 2014-02-04 NOTE — Progress Notes (Signed)
Pt transferred to  [facility] via wheelchair.

## 2014-02-05 DIAGNOSIS — K219 Gastro-esophageal reflux disease without esophagitis: Secondary | ICD-10-CM

## 2014-02-05 DIAGNOSIS — I5023 Acute on chronic systolic (congestive) heart failure: Principal | ICD-10-CM

## 2014-02-05 MED ORDER — PANTOPRAZOLE SODIUM 40 MG PO TBEC: 40.0000 mg | DELAYED_RELEASE_TABLET | Freq: Every day | ORAL | Status: DC

## 2014-02-05 MED ORDER — FUROSEMIDE 40 MG PO TABS: 40.0000 mg | ORAL_TABLET | Freq: Two times a day (BID) | ORAL | Status: DC

## 2014-02-05 MED ORDER — LISINOPRIL 5 MG PO TABS: 5.0000 mg | ORAL_TABLET | Freq: Every day | ORAL | Status: DC

## 2014-02-05 MED ORDER — TRAMADOL HCL 50 MG PO TABS: 50.0000 mg | ORAL_TABLET | Freq: Four times a day (QID) | ORAL | Status: DC | PRN

## 2014-02-05 MED ORDER — POTASSIUM CHLORIDE CRYS ER 20 MEQ PO TBCR: 40.0000 meq | EXTENDED_RELEASE_TABLET | Freq: Every day | ORAL | Status: DC

## 2014-02-05 NOTE — ED Provider Notes (Signed)
I saw and evaluated the patient, reviewed the resident's note and I agree with the findings and plan.   EKG Interpretation   Date/Time:  Friday February 03 2014 17:56:06 EST Ventricular Rate:  84 PR Interval:  146 QRS Duration: 94 QT Interval:  434 QTC Calculation: 512 R Axis:   17 Text Interpretation:  Normal sinus rhythm Incomplete right bundle branch  block Nonspecific T wave abnormality Prolonged QT Abnormal ECG Confirmed  by Jeneen Rinks  MD, [place] ([id number]) on 02/03/2014 6:58:03 PM       Tanna Furry, MD 02/05/14 1640

## 2014-02-05 NOTE — Discharge Summary (Signed)
Physician Discharge Summary  Sydney Shaffer EHU:[id number] DOB: Feb 06, 1966 DOA: 02/03/2014  PCP: Default, Provider, MD  Admit date: 02/03/2014 Discharge date: 02/05/2014  Time spent: >30 minutes  Recommendations for Outpatient Follow-up:  1. Basic metabolic panel to follow electrolytes and renal function 2. Reassess blood pressure and adjust antihypertensive agents as needed   BNP    Component Value Date/Time   PROBNP 305.2* 02/03/2014 1851   Filed Weights   02/04/14 0500 02/04/14 1413 02/05/14 0539  Weight: 89.7 kg (197 lb 12 oz) 87.7 kg (193 lb 5.5 oz) 88.5 kg (195 lb 1.7 oz)     Discharge Diagnoses:  Chest pain/Chest pressure- noncardiac Acute on Chronic systolic heart failure Nonischemic dilated cardiomyopathy Hypertensive heart disease with CHF (congestive heart failure) Implantable defibrillator-Medtronic Atrial tachycardia GERD  Discharge Condition: Stable and improved. Will discharge home with instructions to follow a low-sodium diet, to be compliant with her medications and to follow at the heart failure clinic in 10 days.  Diet recommendation: Low-sodium diet  History of present illness:  48 y.o. female with PMH significant for Non ischemic Cardiomyopathy, Last EF 25 % by ECHO 2013, SP ICD placement 2013, who presents to ED complaining of chest Pain, middle of chest, pressure in quality that started 4 days prior to admission, intermittent, 7/10 in intensity. The chest pain got worse today on exertion. She has been taking her medications. The only medication that she ran out was metoprolol. She received nitroglycerin, in the ED which relieved some of the pressure. This chest pain episode is similar to episode she had on 2013 when she was diagnosed with cardiomyopathy.    Hospital Course:  1-Chest pain/chest pressure: Atypical in nature and pleuritic. -CT angio neg for PE -Negative cardiac enzymes  -Patient with similar presentation in the past secondary to fluid  overload. -2-D echo demonstrated diffuse hypokinesis and ejection fraction 20-25% (unchanged from 2013). -Per cardiology recommendation no further workup for ischemia is necessary at this point. -Will continue coreg, aspirin and ACE inhibitor. -Patient advised to follow a low-sodium diet.  2-Acute on chronic systolic heart failure/non-ischemic cardiomyopathy : Compensated at the moment of discharge. Ejection fraction 20-25%. -Lasix has been adjusted to 40 mg by mouth twice a day -Patient started on lisinopril 5 mg by mouth daily -Continue coreg 12.5 mg by mouth twice a day -Spironolactone 12.5 mg by mouth daily -Patient has been advised to follow low sodium diet (less than 2 g daily) -Encourage to monitor her fluid intake and to check her weight daily. -Patient will follow in 10 days at the heart failure clinic.  3-hypertension: Stable. Will continue current medication regimen.  4-status post AICD: Medtronic. No arrhythmias appreciated  5-gastroesophageal reflux disease: Patient started on Protonix. -Advised to decrease the use of NSAIDs.  6-atrial tachycardia: Continue beta blockers. -Currently normal sinus rhythm.  *The rest of patient's medical problems remains stable during this admission and the plan is to continue current medication regimen.  Procedures:  2-D echo: 02/04/2014 - Moderate LV chamber dilatation with LVEF approximately 20-25%, relatively diffuse hypokinesis, grade 2 diastolic dysfunction. Mild left atrial enlargement. ICD wire in right heart. Mild tricuspid regurgitation with PASP 26 mmHg. Intermittently noted bubble contrast in right atrium, likely from peripheral IV.  Consultations:  Cardiology  Discharge Exam: Filed Vitals:   02/05/14 0933  BP: 113/79  Pulse: 78  Temp: 97.7 F (36.5 C)  Resp: 16    General: Afebrile, denies chest pain or shortness of breath. Cardiovascular: Regular rate, S1 and S2,  no rubs, no gallops or murmurs. No JVD and  Jos trace lower extremity edema bilaterally Respiratory: Clear to auscultation bilaterally Abdomen: Soft, nontender, nondistended, positive bowel sounds -Neurologic exam: Nonfocal  Discharge Instructions  Discharge Orders   Future Appointments Provider Department Dept Phone   02/15/2014 9:30 AM [facility] [place] [phone]   Future Orders Complete By Expires   Diet - low sodium heart healthy  As directed    Discharge instructions  As directed    Comments:     Take medications as prescribed Follow with cardiology as instructed Follow a low sodium diet (< 2 gram daily) Check your weight daily (notify cardiology office for weight gain of more than 3 pounds overnight and/or more than 5 pounds in 1 week)       Medication List    STOP taking these medications       ibuprofen 200 MG tablet  Commonly known as:  ADVIL,MOTRIN     metoprolol succinate 50 MG 24 hr tablet  Commonly known as:  TOPROL-XL      TAKE these medications       aspirin EC 81 MG tablet  Take 81 mg by mouth daily.     carvedilol 25 MG tablet  Commonly known as:  COREG  Take 12.5 mg by mouth 2 (two) times daily with a meal.     ferrous sulfate 325 (65 FE) MG tablet  Take 325 mg by mouth daily with breakfast.     furosemide 40 MG tablet  Commonly known as:  LASIX  Take 1 tablet (40 mg total) by mouth 2 (two) times daily.     lisinopril 5 MG tablet  Commonly known as:  PRINIVIL,ZESTRIL  Take 1 tablet (5 mg total) by mouth daily.     multivitamin with minerals Tabs tablet  Take 1 tablet by mouth daily.     naproxen sodium 220 MG tablet  Commonly known as:  ANAPROX  Take 220 mg by mouth 2 (two) times daily as needed (pain, headache).     potassium chloride SA 20 MEQ tablet  Commonly known as:  K-DUR,KLOR-CON  Take 2 tablets (40 mEq total) by mouth daily.     SLEEP AID PO  Take 1 tablet by mouth at bedtime as needed. For sleep.     spironolactone  25 MG tablet  Commonly known as:  ALDACTONE  Take 12.5 mg by mouth daily.     traMADol 50 MG tablet  Commonly known as:  ULTRAM  Take 1 tablet (50 mg total) by mouth every 6 (six) hours as needed for moderate pain.       No Known Allergies     Follow-up Information   Follow up with [facility] On 02/15/2014. (At 9:30 AM for CHF follow-up)    Specialty:  Cardiology   Contact information:   [address] [id number] Danice Goltz [place] [address] [phone]       The results of significant diagnostics from this hospitalization (including imaging, microbiology, ancillary and laboratory) are listed below for reference.    Significant Diagnostic Studies: Dg Chest 2 View  02/03/2014   CLINICAL DATA:  Shortness of breath, chest pain  EXAM: CHEST - 2 VIEW  COMPARISON:  08/14/2012  FINDINGS: Left subclavian AICD stable. Lungs clear. Heart size upper limits normal. . No pneumothorax. No effusion. Visualized skeletal structures are unremarkable.  IMPRESSION: No acute cardiopulmonary disease.   Electronically Signed  By: Arne Cleveland M.D.   On: 02/03/2014 19:57   Ct Angio Chest Pe W/cm &/or Wo Cm  02/04/2014   CLINICAL DATA:  Chest pain, shortness of Breath  EXAM: CT ANGIOGRAPHY CHEST WITH CONTRAST  TECHNIQUE: Multidetector CT imaging of the chest was performed using the standard protocol during bolus administration of intravenous contrast. Multiplanar CT image reconstructions and MIPs were obtained to evaluate the vascular anatomy.  CONTRAST:  45mL OMNIPAQUE IOHEXOL 350 MG/ML SOLN  COMPARISON:  None.  FINDINGS: Sagittal images of the spine shows mild degenerative changes thoracic spine.  The study is of excellent technical quality. No pulmonary embolus is noted. No pericardial effusion. The visualized upper abdomen is unremarkable.  There is no mediastinal hematoma or adenopathy. Two leads cardiac pacemaker in place.  Images of the lung parenchyma  shows no acute infiltrate or pulmonary edema. No focal consolidation. No pulmonary nodules. I  Review of the MIP images confirms the above findings.  IMPRESSION: 1. No pulmonary embolus. 2. No acute infiltrate or pulmonary edema. 3. No mediastinal hematoma or adenopathy.   Electronically Signed   By: Lahoma Crocker M.D.   On: 02/04/2014 10:50    Microbiology: Recent Results (from the past 240 hour(s))  MRSA PCR SCREENING     Status: None   Collection Time    02/03/14 10:26 PM      Result Value Ref Range Status   MRSA by PCR NEGATIVE  NEGATIVE Final   Comment:            The GeneXpert MRSA Assay (FDA     approved for NASAL specimens     only), is one component of a     comprehensive MRSA colonization     surveillance program. It is not     intended to diagnose MRSA     infection nor to guide or     monitor treatment for     MRSA infections.     Labs: Basic Metabolic Panel:  Recent Labs Lab 02/03/14 1820 02/04/14 0314  NA 142 139  K 3.8 3.4*  CL 104 101  CO2 27 26  GLUCOSE 102* 122*  BUN 18 20  CREATININE 0.92 0.82  CALCIUM 9.2 9.3   CBC:  Recent Labs Lab 02/03/14 1820 02/04/14 0314  WBC 7.2 6.3  HGB 11.1* 10.5*  HCT 33.1* 31.1*  MCV 84.0 84.1  PLT 295 268   Cardiac Enzymes:  Recent Labs Lab 02/03/14 2256 02/04/14 0314 02/04/14 0918  TROPONINI <0.30 <0.30 <0.30   BNP: BNP (last 3 results)  Recent Labs  02/03/14 1851  PROBNP 305.2*    Signed:  Kamea Dacosta  [facility] 02/05/2014, 12:32 PM

## 2014-02-13 ENCOUNTER — Encounter: Payer: No Typology Code available for payment source | Admitting: Internal Medicine

## 2014-02-15 ENCOUNTER — Inpatient Hospital Stay (HOSPITAL_COMMUNITY): Admission: RE | Admit: 2014-02-15 | Payer: No Typology Code available for payment source | Source: Ambulatory Visit

## 2014-02-23 ENCOUNTER — Encounter (HOSPITAL_COMMUNITY): Payer: Self-pay

## 2014-02-23 ENCOUNTER — Ambulatory Visit (HOSPITAL_COMMUNITY)
Admission: RE | Admit: 2014-02-23 | Discharge: 2014-02-23 | Disposition: A | Discharge status: Home / Self Care | Payer: No Typology Code available for payment source | Source: Ambulatory Visit | Attending: Internal Medicine | Admitting: Internal Medicine

## 2014-02-23 VITALS — BP 137/84 | HR 84 | Resp 18 | Wt 196.4 lb

## 2014-02-23 DIAGNOSIS — I110 Hypertensive heart disease with heart failure: Secondary | ICD-10-CM

## 2014-02-23 DIAGNOSIS — I5022 Chronic systolic (congestive) heart failure: Secondary | ICD-10-CM

## 2014-02-23 DIAGNOSIS — Z95810 Presence of automatic (implantable) cardiac defibrillator: Secondary | ICD-10-CM

## 2014-02-23 DIAGNOSIS — I509 Heart failure, unspecified: Secondary | ICD-10-CM

## 2014-02-23 MED ORDER — POTASSIUM CHLORIDE CRYS ER 20 MEQ PO TBCR: 20.0000 meq | EXTENDED_RELEASE_TABLET | Freq: Every day | ORAL | Status: DC

## 2014-02-23 MED ORDER — LISINOPRIL 5 MG PO TABS: 5.0000 mg | ORAL_TABLET | Freq: Two times a day (BID) | ORAL | Status: DC

## 2014-02-23 NOTE — Progress Notes (Signed)
Patient ID: Sydney Shaffer, female   DOB: 1966/02/13, 48 y.o.   MRN: [id number]  Weight Range   Baseline proBNP   EP: Dr Caryl Comes Medtronic ICD  HPI: Sydney Shaffer is a 48 y.o. female with a history of NICM,  Chronic Systolic Heart Failure EF 20-25%,  S/P ICD, SVT and  S/P SVT 01/08/14 ablation.    Admitted 02/03/14 with chest pain. through 02/05/14. CEs negative. She had been out of metoprolol. She was discharged on coreg 12.5 mg twice a day, lasix 40mg  twice a day, spiro 12.5 mg daily, and  lisinopril 5 mg daily.  Discharge weight 195 pounds.    She returns for follow up.  Mild dyspnea with exertion. Able to walk 2 blocks but does get SOB. . SOB and dizziness going up steps. She is able walk up 3 flights of stairs. Denies PND. + Orthopnea sleeps on 3 pillows. Has all medications   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 She does not drink alcohol . Lives alone. She has 3 grown children  FH: Mom breast cancer . Father cancer      ROS: All systems negative except as listed in HPI, PMH and Problem List.  Past Medical History  Diagnosis Date  . CHF (congestive heart failure)     Dx 03/2012 - dilated cardiomyopathy with EF 20-25% by echo (abnl nuc but normal coronaries 04/02/12 per cath.   . Anemia     felt to be due to heavy menstrual flow  . Chronic systolic heart failure   . Hypertensive heart disease with CHF   . Hypertension   . Dysrhythmia     Bradycardia  . 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 Atrial tachycardia     s/p ablation  . AVNRT (AV nodal re-entry tachycardia)     s/p ablation    Current Outpatient Prescriptions  Medication Sig Dispense Refill  . aspirin EC 81 MG tablet Take 81 mg by mouth daily.      . carvedilol (COREG) 25 MG tablet Take 12.5 mg by mouth 2 (two) times daily with a meal.      . Doxylamine Succinate, Sleep, (SLEEP AID PO) Take 1 tablet by mouth at  bedtime as needed. For sleep.      . ferrous sulfate 325 (65 FE) MG tablet Take 325 mg by mouth daily with breakfast.      . furosemide (LASIX) 40 MG tablet Take 1 tablet (40 mg total) by mouth 2 (two) times daily.  60 tablet  2  . lisinopril (PRINIVIL,ZESTRIL) 5 MG tablet Take 1 tablet (5 mg total) by mouth daily.  30 tablet  1  . Multiple Vitamin (MULTIVITAMIN WITH MINERALS) TABS Take 1 tablet by mouth daily.      . naproxen sodium (ANAPROX) 220 MG tablet Take 220 mg by mouth 2 (two) times daily as needed (pain, headache).      . pantoprazole (PROTONIX) 40 MG tablet Take 1 tablet (40 mg total) by mouth daily at 12 noon.  30 tablet  1  . potassium chloride SA (K-DUR,KLOR-CON) 20 MEQ tablet Take 2 tablets (40 mEq total) by mouth daily.  60 tablet  1  . spironolactone (ALDACTONE) 25 MG tablet Take 12.5 mg by mouth daily.      . traMADol (ULTRAM) 50 MG tablet Take 1 tablet (50 mg total) by mouth every 6 (six) hours as  needed for moderate pain.  40 tablet  0   No current facility-administered medications for this encounter.     PHYSICAL EXAM: Filed Vitals:   02/23/14 0914  BP: 137/84  Pulse: 84  Resp: 18  Weight: 196 lb 6 oz (89.075 kg)  SpO2: 99%    General:  Well appearing. No resp difficulty  HEENT: normal Neck: supple. JVP 5-6 . Carotids 2+ bilaterally; no bruits. No lymphadenopathy or thryomegaly appreciated. Cor: PMI normal. Regular rate & rhythm. No rubs, gallops or murmurs. Lungs: clear Abdomen: soft, nontender, nondistended. No hepatosplenomegaly. No bruits or masses. Good bowel sounds. Extremities: no cyanosis, clubbing, rash, edema Neuro: alert & orientedx3, cranial nerves grossly intact. Moves all 4 extremities w/o difficulty. Affect pleasant.      ASSESSMENT & PLAN:  1. Chronic Systolic Heart Failure EF 20-25% NICM . Has Medtronic ICD Reviewed discharge summary from 02/05/14 to include lab work.  Optivol- activity ~ 4hours per day. Fluid well below threshold. No  VT. NYHA II-III. Volume status stable. Discharged on lasix 40 mg twice a day but she has been taking it once a day with stable volume status. Continue lasix 40 mg daily and 12.5 mg spironolactone. Cut back potassium to 20 meq daily.  Continue carvedilol 12.5 mg twice a day Increase lisnopril 5 mg twice a day.Check BMET in 10 days.  Will repeat ECHO in 3-4 months after HF meds optimized. If EF remains down and she remains symptomatic will need CPX to determine if limitations are cardiac versus pulmonary.  Reinforced daily weights, low salt food choices, limiting fluid intake < 2 liters, and medication titration.   2. S/P ICD 2013 Medtronic - optivol  3. HTN- Continue current dose of carvedilol, spironolactone, and lasix. Increase lisinopril to 5 mg twice a day. Check BMET in 10 days.   Follow up in 1 month and continue titrating HF meds.    CLEGG,AMY NP-C  9:47 AM

## 2014-02-23 NOTE — Patient Instructions (Addendum)
Follow  Up in 1 month  Take lisinopril 5 mg twice a day  Please get lab work at Dr Olin Pia office next Thursday anytime!  We enjoyed having you with [place] today!  Do the following things EVERYDAY: 1) Weigh yourself in the morning before breakfast. Write it down and keep it in a log. 2) Take your medicines as prescribed 3) Eat low salt foods-Limit salt (sodium) to 2000 mg per day.  4) Stay as active as you can everyday 5) Limit all fluids for the day to less than 2 liters

## 2014-03-07 ENCOUNTER — Encounter: Payer: Self-pay | Admitting: Internal Medicine

## 2014-03-13 ENCOUNTER — Ambulatory Visit (INDEPENDENT_AMBULATORY_CARE_PROVIDER_SITE_OTHER): Payer: No Typology Code available for payment source | Admitting: Internal Medicine

## 2014-03-13 ENCOUNTER — Encounter: Payer: Self-pay | Admitting: Internal Medicine

## 2014-03-13 VITALS — BP 105/68 | HR 68 | Ht 67.0 in | Wt 202.0 lb

## 2014-03-13 DIAGNOSIS — I471 Supraventricular tachycardia: Secondary | ICD-10-CM

## 2014-03-13 DIAGNOSIS — I498 Other specified cardiac arrhythmias: Secondary | ICD-10-CM

## 2014-03-13 DIAGNOSIS — I5022 Chronic systolic (congestive) heart failure: Secondary | ICD-10-CM

## 2014-03-13 DIAGNOSIS — I420 Dilated cardiomyopathy: Secondary | ICD-10-CM

## 2014-03-13 DIAGNOSIS — I428 Other cardiomyopathies: Secondary | ICD-10-CM

## 2014-03-13 LAB — MDC_IDC_ENUM_SESS_TYPE_INCLINIC
Battery Voltage: 3.14 V
Brady Statistic AP VP Percent: 0 %
Brady Statistic AP VS Percent: 1.95 %
Brady Statistic AS VP Percent: 0.03 %
Brady Statistic AS VS Percent: 98.02 %
Brady Statistic RA Percent Paced: 1.95 %
Brady Statistic RV Percent Paced: 0.03 %
Date Time Interrogation Session: 20150406162613
HIGH POWER IMPEDANCE MEASURED VALUE: 19 Ohm
HIGH POWER IMPEDANCE MEASURED VALUE: 69 Ohm
HighPow Impedance: 342 Ohm
Lead Channel Impedance Value: 342 Ohm
Lead Channel Impedance Value: 399 Ohm
Lead Channel Pacing Threshold Pulse Width: 0.4 ms
Lead Channel Sensing Intrinsic Amplitude: 2.25 mV
Lead Channel Sensing Intrinsic Amplitude: 9.625 mV
Lead Channel Setting Pacing Amplitude: 2 V
Lead Channel Setting Sensing Sensitivity: 0.3 mV
MDC IDC MSMT LEADCHNL RA PACING THRESHOLD AMPLITUDE: 0.375 V
MDC IDC MSMT LEADCHNL RV PACING THRESHOLD AMPLITUDE: 0.875 V
MDC IDC MSMT LEADCHNL RV PACING THRESHOLD PULSEWIDTH: 0.4 ms
MDC IDC SET LEADCHNL RV PACING AMPLITUDE: 2.5 V
MDC IDC SET LEADCHNL RV PACING PULSEWIDTH: 0.4 ms
MDC IDC SET ZONE DETECTION INTERVAL: 350 ms
MDC IDC SET ZONE DETECTION INTERVAL: 350 ms
Zone Setting Detection Interval: 240 ms
Zone Setting Detection Interval: 270 ms
Zone Setting Detection Interval: 280 ms

## 2014-03-13 NOTE — Progress Notes (Signed)
Patient Care Team: Provider Default, MD as PCP - General (Orthopedic Surgery)   HPI  Sydney Shaffer is a 48 y.o. female Seen in followup for ICD implanted Sept 2013 for primary prevention in the setting of nonischemic cardiomyopathy. QRS was narrow.   She has a history of atrial tachycardia ablation. This was noted then approximated of the AV node ECHO 07/05/12 EF 20-25%  ECHO 02/04/14 EF 20-25%    Past Medical History  Diagnosis Date  . CHF (congestive heart failure)     Dx 03/2012 - dilated cardiomyopathy with EF 20-25% by echo (abnl nuc but normal coronaries 04/02/12 per cath.   . Anemia     felt to be due to heavy menstrual flow  . Chronic systolic heart failure   . Hypertensive heart disease with CHF   . Hypertension   . Dysrhythmia     Bradycardia  . 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 Atrial tachycardia     s/p ablation  . 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 Tubal ligation    . Appendectomy    . Cardiac catheterization  April 2013    normal coronaries  . Icd  08/13/2012  . Ep study and ablation  01/07/13    Ablation of AVNRT and atrial tachycardia (arising from the anteroseptal RA 68mm above the HIS)    Current Outpatient Prescriptions  Medication Sig Dispense Refill  . aspirin EC 81 MG tablet Take 81 mg by mouth daily.      . carvedilol (COREG) 25 MG tablet Take 12.5 mg by mouth 2 (two) times daily with a meal.      . Doxylamine Succinate, Sleep, (SLEEP AID PO) Take 1 tablet by mouth at bedtime as needed. For sleep.      . ferrous sulfate 325 (65 FE) MG tablet Take 325 mg by mouth daily with breakfast.      . furosemide (LASIX) 40 MG tablet Take 40 mg by mouth daily.      Marland Kitchen lisinopril (PRINIVIL,ZESTRIL) 5 MG tablet Take 1 tablet (5 mg total) by mouth 2 (two) times daily before a meal.  60 tablet  6  . Multiple Vitamin (MULTIVITAMIN WITH MINERALS) TABS Take  1 tablet by mouth daily.      . naproxen sodium (ANAPROX) 220 MG tablet Take 220 mg by mouth 2 (two) times daily as needed (pain, headache).      . pantoprazole (PROTONIX) 40 MG tablet Take 1 tablet (40 mg total) by mouth daily at 12 noon.  30 tablet  1  . potassium chloride SA (K-DUR,KLOR-CON) 20 MEQ tablet Take 1 tablet (20 mEq total) by mouth daily.  30 tablet  6  . spironolactone (ALDACTONE) 25 MG tablet Take 12.5 mg by mouth daily.      . traMADol (ULTRAM) 50 MG tablet Take 1 tablet (50 mg total) by mouth every 6 (six) hours as needed for moderate pain.  40 tablet  0   No current facility-administered medications for this visit.    No Known Allergies  Review of Systems negative except from HPI and PMH  Physical Exam BP 105/68  Pulse 68  Ht 5\' 7"  (1.702 m)  Wt 202 lb (91.627 kg)  BMI 31.63 kg/m2 Well developed and well nourished in no acute distress HENT normal E scleral and icterus clear Neck Supple JVP flat; carotids brisk and full  Clear to ausculation    Regular rate and rhythm, no murmurs gallops or rub Soft with active bowel sounds No clubbing cyanosis  Edema Alert and oriented, grossly normal motor and sensory function Skin Warm and Dry    Assessment and  Plan  Nonischemic cardiomyopathy  Atrial Tachycardia/Flutter (CL270-80)  ICD Medtronic The patient's device was interrogated and the information was fully reviewed.  The device was reprogrammed to  Lower rate limit from 60--50  CHF chronic systolic  She is euvolemic. She had an episode of atrial tachycardia in October which was treated with ATP associated with increase in the degree of block and full in the lower rate detection.  She is questions and anxiety related to her cardiomyopathy and her atrial arrhythmia.  We'll continue her current medications.

## 2014-03-13 NOTE — Patient Instructions (Addendum)
Your physician recommends that you continue on your current medications as directed. Please refer to the Current Medication list given to you today.  Your physician recommends that you return for lab work today: BMET  Your physician recommends that you schedule a follow-up appointment in: 3 months with device clinic.  Your physician wants you to follow-up in: 1 year with Dr. Caryl Comes.  You will receive a reminder letter in the mail two months in advance. If you don't receive a letter, please call our office to schedule the follow-up appointment.

## 2014-03-14 LAB — BASIC METABOLIC PANEL
BUN: 23 mg/dL (ref 6–23)
CO2: 26 mEq/L (ref 19–32)
Calcium: 9.3 mg/dL (ref 8.4–10.5)
Chloride: 107 mEq/L (ref 96–112)
Creatinine, Ser: 1 mg/dL (ref 0.4–1.2)
GFR: 79.52 mL/min (ref >60.00)
Glucose, Bld: 95 mg/dL (ref 70–99)
Potassium: 3.6 mEq/L (ref 3.5–5.1)
SODIUM: 138 meq/L (ref 135–145)

## 2014-03-16 ENCOUNTER — Telehealth (HOSPITAL_COMMUNITY): Payer: Self-pay

## 2014-03-16 NOTE — Telephone Encounter (Signed)
Patient called and made aware of stable lab results. Sydney Shaffer

## 2014-03-23 ENCOUNTER — Encounter (HOSPITAL_COMMUNITY): Payer: Self-pay

## 2014-03-23 ENCOUNTER — Inpatient Hospital Stay (HOSPITAL_COMMUNITY): Admission: RE | Admit: 2014-03-23 | Payer: No Typology Code available for payment source | Source: Ambulatory Visit

## 2014-03-30 ENCOUNTER — Ambulatory Visit (HOSPITAL_COMMUNITY)
Admission: RE | Admit: 2014-03-30 | Discharge: 2014-03-30 | Disposition: A | Discharge status: Home / Self Care | Payer: No Typology Code available for payment source | Source: Ambulatory Visit | Attending: Internal Medicine | Admitting: Internal Medicine

## 2014-03-30 VITALS — BP 140/80 | HR 94 | Wt 201.2 lb

## 2014-03-30 DIAGNOSIS — I5022 Chronic systolic (congestive) heart failure: Secondary | ICD-10-CM | POA: Insufficient documentation

## 2014-03-30 DIAGNOSIS — I498 Other specified cardiac arrhythmias: Secondary | ICD-10-CM | POA: Insufficient documentation

## 2014-03-30 DIAGNOSIS — I471 Supraventricular tachycardia, unspecified: Secondary | ICD-10-CM | POA: Insufficient documentation

## 2014-03-30 DIAGNOSIS — I10 Essential (primary) hypertension: Secondary | ICD-10-CM | POA: Insufficient documentation

## 2014-03-30 HISTORY — DX: Supraventricular tachycardia, unspecified: I47.10

## 2014-03-30 MED ORDER — LISINOPRIL 5 MG PO TABS: ORAL_TABLET | ORAL | Status: DC

## 2014-03-30 MED ORDER — SPIRONOLACTONE 25 MG PO TABS: 25.0000 mg | ORAL_TABLET | Freq: Every day | ORAL | Status: DC

## 2014-03-30 NOTE — Progress Notes (Signed)
Patient ID: Sydney Shaffer, female   DOB: 01-05-1966, 48 y.o.   MRN: [id number]  EP: Dr Caryl Comes Medtronic ICD PCP: [name]  HPI: Sydney Shaffer is a 48 y.o. female with a history of NICM s/p ICD chronic systolic heart failure, and SVT  S/P SVT 01/08/14 ablation.    Admitted 02/03/14 with chest pain. through 02/05/14. CEs negative. She had been out of metoprolol. She was discharged on coreg 12.5 mg twice a day, lasix 40mg  twice a day, spiro 12.5 mg daily, and  lisinopril 5 mg daily.  Discharge weight 195 pounds.    Follow up:  Last visit increased lisinopril to 5 mg BID which she tolerated. Overall feeling very fatigued. She has her good days and bad days. On her bad days has to take frequent rest breaks d/t fatigue and SOB. In 2 weeks span she has about 4 bad days. DOE with moderate exertion. Can walk about 500 ft before fatigue and SOB. Can go up 4 flights of stairs. Denies CP, palpitations, LE edema or PND. +orthopnea 3 pillows. Trying to follow a low salt diet and drink less than 2L a day. Weight has been trending up 198-201 lbs.   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 She does not drink alcohol . Lives alone. She has 3 grown children   FH: Mom breast cancer . Father cancer   ROS: All systems negative except as listed in HPI, PMH and Problem List.  Past Medical History  Diagnosis Date  . CHF (congestive heart failure)     Dx 03/2012 - dilated cardiomyopathy with EF 20-25% by echo (abnl nuc but normal coronaries 04/02/12 per cath.   . Anemia     felt to be due to heavy menstrual flow  . Chronic systolic heart failure   . Hypertensive heart disease with CHF   . Hypertension   . Dysrhythmia     Bradycardia  . 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 Atrial tachycardia     s/p ablation  . AVNRT (AV nodal re-entry tachycardia)     s/p ablation    Current Outpatient Prescriptions  Medication Sig  Dispense Refill  . aspirin EC 81 MG tablet Take 81 mg by mouth daily.      . carvedilol (COREG) 25 MG tablet Take 12.5 mg by mouth 2 (two) times daily with a meal.      . Doxylamine Succinate, Sleep, (SLEEP AID PO) Take 1 tablet by mouth at bedtime as needed. For sleep.      . ferrous sulfate 325 (65 FE) MG tablet Take 325 mg by mouth daily with breakfast.      . furosemide (LASIX) 40 MG tablet Take 40 mg by mouth daily.      Marland Kitchen lisinopril (PRINIVIL,ZESTRIL) 5 MG tablet Take 1 tablet (5 mg total) by mouth 2 (two) times daily before a meal.  60 tablet  6  . Multiple Vitamin (MULTIVITAMIN WITH MINERALS) TABS Take 1 tablet by mouth daily.      . naproxen sodium (ANAPROX) 220 MG tablet Take 220 mg by mouth 2 (two) times daily as needed (pain, headache).      . pantoprazole (PROTONIX) 40 MG tablet Take 1 tablet (40 mg total) by mouth daily at 12 noon.  30 tablet  1  . potassium chloride SA (K-DUR,KLOR-CON) 20 MEQ tablet Take 1 tablet (20 mEq total) by mouth daily.  30 tablet  6  . spironolactone (ALDACTONE) 25 MG tablet Take 12.5 mg by mouth daily.      . traMADol (ULTRAM) 50 MG tablet Take 1 tablet (50 mg total) by mouth every 6 (six) hours as needed for moderate pain.  40 tablet  0   No current facility-administered medications for this encounter.    Filed Vitals:   03/30/14 1543  BP: 140/80  Pulse: 94  Weight: 201 lb 3.2 oz (91.264 kg)  SpO2: 100%    PHYSICAL EXAM: General:  Well appearing. No resp difficulty  HEENT: normal Neck: supple. JVP 8. Carotids 2+ bilaterally; no bruits. No lymphadenopathy or thryomegaly appreciated. Cor: PMI normal. Regular rate & rhythm. No rubs, gallops or murmurs. Lungs: clear Abdomen: soft, nontender, nondistended. No hepatosplenomegaly. No bruits or masses. Good bowel sounds. Extremities: no cyanosis, clubbing, rash, trace edema Neuro: alert & orientedx3, cranial nerves grossly intact. Moves all 4 extremities w/o difficulty. Affect  pleasant.    ASSESSMENT & PLAN:  1. Chronic Systolic Heart Failure: NICM s/p Medtronic ICD. EF 20-25% (01/2014) - NYHA II-III symptoms and volume status elevated. Instructed to take an extra 40 mg of lasix for the next 2 days and whenever her weight is greater than 199 lbs at home. Instructed to call if she is having to take a lot of extra lasix.  - Optivol: No crossings over threshold however approaching line and thoracic impedence down. Patient activity 4-6 hrs a day. Good HR variability. - Continue coreg 12.5 mg BID, will not increase with fatigue. - Will increase lisinopril to 5 mg q am and 10 mg qpm and spironolactone to 25 mg daily. - Will have her take 20 meq of potassium for next 2 days with increased lasix and then she can stop with increased ACE-I and spiro. - Recheck BMET 2 weeks. - Will schedule CPX to assess functional capacity - Reinforced the need and importance of daily weights, a low sodium diet, and fluid restriction (less than 2 L a day). Instructed to call the HF clinic if weight increases more than 3 lbs overnight or 5 lbs in a week.  2. SVT  - s/p ICD 2013 Medtronic; no complaints of palpitations 3. HTN - as above will increase lisinopril and spiro. BMET 2 weeks.   SW going to work on trying to find a PCP for the patient.   CPX 2 weeks and F/U 1 month Rande Brunt NP-C  3:58 PM

## 2014-03-30 NOTE — Patient Instructions (Addendum)
Increase your spironolactone to 25 mg (1 tablet) daily.  Increase your lisinopril to 5 mg (1 tablet) in the morning and 10 mg (2 tablets) in the evening.  Take an extra lasix for 2 days. Whenever your weight is greater than 199 lbs on your scale take an extra lasix. Call the clinic if you are having to increase a lot.  For the next 2 days take an extra potassium and then you can stop taking.  Will schedule CPX test in 2 weeks with recheck in labs.  Call any issues [phone].  F/u 1 month  Do the following things EVERYDAY: 1) Weigh yourself in the morning before breakfast. Write it down and keep it in a log. 2) Take your medicines as prescribed 3) Eat low salt foods-Limit salt (sodium) to 2000 mg per day.  4) Stay as active as you can everyday 5) Limit all fluids for the day to less than 2 liters 6)

## 2014-04-03 ENCOUNTER — Telehealth: Payer: Self-pay | Admitting: Licensed Clinical Social Worker

## 2014-04-03 NOTE — Telephone Encounter (Signed)
CSW referred to assist with obtaining a PCP. CSW checked insurance and patient is assigned to [facility] for primary care. CSW contacted patient by phone who reports she has been unable to explore options for primary care. CSW explored resources for [facility] primary care offices and provided patient with some options in [place]. Patient will follow up with office to schedule appointment. Patient verbalized understanding of information provided and denies any other concerns at this time. CSW available if further needs arise. Raquel Sarna, [place]

## 2014-04-13 ENCOUNTER — Ambulatory Visit (HOSPITAL_COMMUNITY): Payer: No Typology Code available for payment source | Attending: Internal Medicine

## 2014-04-13 DIAGNOSIS — I5022 Chronic systolic (congestive) heart failure: Secondary | ICD-10-CM

## 2014-04-24 ENCOUNTER — Encounter: Payer: Self-pay | Admitting: Internal Medicine

## 2014-04-27 ENCOUNTER — Encounter (HOSPITAL_COMMUNITY): Payer: No Typology Code available for payment source

## 2014-05-11 ENCOUNTER — Ambulatory Visit (HOSPITAL_COMMUNITY)
Admission: RE | Admit: 2014-05-11 | Discharge: 2014-05-11 | Disposition: A | Discharge status: Home / Self Care | Payer: Self-pay | Source: Ambulatory Visit | Attending: Internal Medicine | Admitting: Internal Medicine

## 2014-05-11 ENCOUNTER — Encounter (HOSPITAL_COMMUNITY): Payer: Self-pay

## 2014-05-11 ENCOUNTER — Telehealth (HOSPITAL_COMMUNITY): Payer: Self-pay | Admitting: Anesthesiology

## 2014-05-11 VITALS — BP 113/71 | HR 82 | Resp 18 | Wt 200.4 lb

## 2014-05-11 DIAGNOSIS — I509 Heart failure, unspecified: Secondary | ICD-10-CM | POA: Insufficient documentation

## 2014-05-11 DIAGNOSIS — I498 Other specified cardiac arrhythmias: Secondary | ICD-10-CM

## 2014-05-11 DIAGNOSIS — R0989 Other specified symptoms and signs involving the circulatory and respiratory systems: Secondary | ICD-10-CM

## 2014-05-11 DIAGNOSIS — Z79899 Other long term (current) drug therapy: Secondary | ICD-10-CM | POA: Insufficient documentation

## 2014-05-11 DIAGNOSIS — I428 Other cardiomyopathies: Secondary | ICD-10-CM

## 2014-05-11 DIAGNOSIS — I471 Supraventricular tachycardia: Secondary | ICD-10-CM

## 2014-05-11 DIAGNOSIS — I420 Dilated cardiomyopathy: Secondary | ICD-10-CM

## 2014-05-11 DIAGNOSIS — I5022 Chronic systolic (congestive) heart failure: Secondary | ICD-10-CM

## 2014-05-11 DIAGNOSIS — R0609 Other forms of dyspnea: Secondary | ICD-10-CM

## 2014-05-11 DIAGNOSIS — I10 Essential (primary) hypertension: Secondary | ICD-10-CM

## 2014-05-11 DIAGNOSIS — R0600 Dyspnea, unspecified: Secondary | ICD-10-CM

## 2014-05-11 DIAGNOSIS — Z95810 Presence of automatic (implantable) cardiac defibrillator: Secondary | ICD-10-CM | POA: Insufficient documentation

## 2014-05-11 LAB — BASIC METABOLIC PANEL
BUN: 17 mg/dL (ref 6–23)
CHLORIDE: 100 meq/L (ref 96–112)
CO2: 24 meq/L (ref 19–32)
Calcium: 9.8 mg/dL (ref 8.4–10.5)
Creatinine, Ser: 0.84 mg/dL (ref 0.50–1.10)
GFR calc Af Amer: >90 mL/min (ref >90)
GFR calc non Af Amer: 81 mL/min — ABNORMAL LOW (ref >90)
GLUCOSE: 119 mg/dL — AB (ref 70–99)
POTASSIUM: 3.3 meq/L — AB (ref 3.7–5.3)
SODIUM: 139 meq/L (ref 137–147)

## 2014-05-11 MED ORDER — LISINOPRIL 10 MG PO TABS: 10.0000 mg | ORAL_TABLET | Freq: Every day | ORAL | Status: DC

## 2014-05-11 MED ORDER — POTASSIUM CHLORIDE CRYS ER 20 MEQ PO TBCR: 20.0000 meq | EXTENDED_RELEASE_TABLET | Freq: Every day | ORAL | Status: DC

## 2014-05-11 NOTE — Telephone Encounter (Signed)
Reviewed patients labs and K+ 3.3. Increase lisinopril to 10 mg BID and start potassium 20 meq daily. Repeat blood work 7-10 days. Left message for patient to call clinic.

## 2014-05-11 NOTE — Patient Instructions (Signed)
Will call about lab results.  Follow up in 1 month.  Will have Social Worker call you about disability.  Do the following things EVERYDAY: 1) Weigh yourself in the morning before breakfast. Write it down and keep it in a log. 2) Take your medicines as prescribed 3) Eat low salt foods-Limit salt (sodium) to 2000 mg per day.  4) Stay as active as you can everyday 5) Limit all fluids for the day to less than 2 liters 6)

## 2014-05-11 NOTE — Progress Notes (Signed)
Patient ID: Sydney Shaffer, female   DOB: May 25, 1966, 48 y.o.   MRN: [id number]  EP: Dr Caryl Comes Medtronic ICD PCP: [name] (looking for one)  HPI: Sydney Shaffer is a 48 y.o. female with a history of NICM s/p ICD chronic systolic heart failure, and SVT S/P SVT 01/08/14 ablation.    Admitted 02/03/14 with chest pain. through 02/05/14. CEs negative. She had been out of metoprolol. She was discharged on coreg 12.5 mg twice a day, lasix 40mg  twice a day, spiro 12.5 mg daily, and  lisinopril 5 mg daily.  Discharge weight 195 pounds.    ECHO 07/05/12 EF 20-25%  ECHO 02/04/14 EF 20-25%   CPX 06/03/14: FVC 1.69 (51%)  FEV1 1.24 (47%)  FEV1/FVC 74%  Peak VO2: 12.6 (57.8% predicted peak VO2 VE/VCO2 slope: 28.6 OUES: 1.60 Peak RER: 1.01 Peak VO2 adjusted to the patient's ideal body weight of 150 lb (67.8 kg) the peak VO2 is 16.5 ml/kg (ibw)/min (62% of the ibw-adjusted predicted).  Follow up for Heart Failure: Last visit increased lisinopril to 5 mg q am and 10 mg q pm and we increased spironolactone to 25 mg daily which she tolerated. Had CPX test showing submaximal test and moderate functional limitation d/t restricitve ventilation. Reports feeling fatigued. Not sleeping well. +SOB and PND. Denies orthopnea or CP. Weight at home 195-200 lbs. Following a low salt diet and drinking less than 2L a day.   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 She does not drink alcohol . Lives alone. She has 3 grown children   FH: Mom breast cancer . Father cancer   ROS: All systems negative except as listed in HPI, PMH and Problem List.  Past Medical History  Diagnosis Date  . CHF (congestive heart failure)     Dx 03/2012 - dilated cardiomyopathy with EF 20-25% by echo (abnl nuc but normal coronaries 04/02/12 per cath.   . Anemia     felt to be due to heavy menstrual flow  . Chronic systolic heart failure   . Hypertensive heart disease with CHF   . Hypertension   . Dysrhythmia     Bradycardia   . 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 Atrial tachycardia     s/p ablation  . AVNRT (AV nodal re-entry tachycardia)     s/p ablation    Current Outpatient Prescriptions  Medication Sig Dispense Refill  . aspirin EC 81 MG tablet Take 81 mg by mouth daily.      . carvedilol (COREG) 25 MG tablet Take 12.5 mg by mouth 2 (two) times daily with a meal.      . Doxylamine Succinate, Sleep, (SLEEP AID PO) Take 1 tablet by mouth at bedtime as needed. For sleep.      . ferrous sulfate 325 (65 FE) MG tablet Take 325 mg by mouth daily with breakfast.      . furosemide (LASIX) 40 MG tablet Take 40 mg by mouth daily.      Marland Kitchen lisinopril (PRINIVIL,ZESTRIL) 5 MG tablet Take 5 mg (1 tablet) in the morning and 10 mg (2 tablets) in the evening.  270 tablet  3  . Multiple Vitamin (MULTIVITAMIN WITH MINERALS) TABS Take 1 tablet by mouth daily.      . naproxen sodium (ANAPROX) 220 MG tablet Take 220 mg by mouth 2 (two) times daily as needed (pain, headache).      . pantoprazole (PROTONIX) 40 MG tablet Take 1 tablet (40 mg  total) by mouth daily at 12 noon.  30 tablet  1  . spironolactone (ALDACTONE) 25 MG tablet Take 1 tablet (25 mg total) by mouth daily.  90 tablet  3  . traMADol (ULTRAM) 50 MG tablet Take 1 tablet (50 mg total) by mouth every 6 (six) hours as needed for moderate pain.  40 tablet  0   No current facility-administered medications for this encounter.    Filed Vitals:   05/11/14 1527  BP: 113/71  Pulse: 82  Resp: 18  Weight: 200 lb 6 oz (90.89 kg)  SpO2: 97%    PHYSICAL EXAM: General:  Well appearing. No resp difficulty  HEENT: normal Neck: supple. JVP 8. Carotids 2+ bilaterally; no bruits. No lymphadenopathy or thryomegaly appreciated. Cor: PMI normal. Regular rate & rhythm. No rubs, gallops or murmurs. Lungs: clear Abdomen: soft, nontender, nondistended. No hepatosplenomegaly. No bruits or masses. Good bowel sounds. Extremities: no cyanosis, clubbing, rash,  trace edema Neuro: alert & orientedx3, cranial nerves grossly intact. Moves all 4 extremities w/o difficulty. Affect pleasant.    ASSESSMENT & PLAN:  1. Chronic Systolic Heart Failure: NICM s/p Medtronic ICD. EF 20-25% (01/2014) - NYHA II-III symptoms and volume status stable. Will continue lasix 40 mg daily. -Optivol interrogated and fluid below threshold and thoracic impendence up. Activity 4-5 hrs a day. - Continue coreg 12.5 mg BID, will not increase with fatigue. - Check BMET and if K+ and Cr stable will increase lisinopril to 10 mg BID. Continue spiro at current dose.  - Discussed results from CPX test with patient and patient will eventually need RHC to assess hemodynamics since she continues to be fatigued and had sub-optimal test. Will discuss with MD for timing.  - Reinforced the need and importance of daily weights, a low sodium diet, and fluid restriction (less than 2 L a day). Instructed to call the HF clinic if weight increases more than 3 lbs overnight or 5 lbs in a week.  2. SVT  - s/p ICD 2013 Medtronic; no complaints of palpitations 3. HTN - Stable. If BMET stable will increase lisinopril as above.   F/U 1 month Rande Brunt NP-C  3:42 PM

## 2014-05-12 ENCOUNTER — Telehealth (HOSPITAL_COMMUNITY): Payer: Self-pay

## 2014-05-12 NOTE — Telephone Encounter (Signed)
Labs reviewed with patient.  Instructed to start potassium 20 meq once daily as well as increasing lisinopril to 10mg  twice daily.  Aware and agreeable.  Rx sent to preferred pharmacy electronically.

## 2014-05-17 ENCOUNTER — Encounter: Payer: Self-pay | Admitting: Internal Medicine

## 2014-05-22 ENCOUNTER — Telehealth: Payer: Self-pay | Admitting: Licensed Clinical Social Worker

## 2014-05-22 NOTE — Telephone Encounter (Signed)
CSW contacted patient via phone. Patient states it is getting harder to work due to declining health and SOB. 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 Patient states that she often takes breaks and not sure how long her employer will allow the breaks. Patient has inquired at work about disability and informed that there are no disability benefits through employer. She has appointment for [employer] application. CSW will follow up with patient at next clinic appointment on 7/2 at 1:30 pm. Raquel Sarna, [place]

## 2014-06-07 ENCOUNTER — Other Ambulatory Visit: Payer: Self-pay | Admitting: Nurse Practitioner

## 2014-06-08 ENCOUNTER — Encounter (HOSPITAL_COMMUNITY): Payer: No Typology Code available for payment source

## 2014-06-22 ENCOUNTER — Encounter (HOSPITAL_COMMUNITY): Payer: No Typology Code available for payment source

## 2014-07-03 ENCOUNTER — Encounter (HOSPITAL_COMMUNITY): Payer: Self-pay

## 2014-07-03 ENCOUNTER — Encounter: Payer: Self-pay | Admitting: Internal Medicine

## 2014-07-03 ENCOUNTER — Ambulatory Visit (HOSPITAL_COMMUNITY)
Admission: RE | Admit: 2014-07-03 | Discharge: 2014-07-03 | Disposition: A | Discharge status: Home / Self Care | Payer: Self-pay | Source: Ambulatory Visit | Attending: Cardiology | Admitting: Cardiology

## 2014-07-03 VITALS — BP 116/64 | HR 106 | Resp 18 | Wt 196.4 lb

## 2014-07-03 DIAGNOSIS — I472 Ventricular tachycardia, unspecified: Secondary | ICD-10-CM

## 2014-07-03 DIAGNOSIS — I5022 Chronic systolic (congestive) heart failure: Secondary | ICD-10-CM | POA: Insufficient documentation

## 2014-07-03 DIAGNOSIS — I4729 Other ventricular tachycardia: Secondary | ICD-10-CM

## 2014-07-03 MED ORDER — AMIODARONE HCL 200 MG PO TABS: 200.0000 mg | ORAL_TABLET | Freq: Two times a day (BID) | ORAL | Status: DC

## 2014-07-03 MED ORDER — RIVAROXABAN 20 MG PO TABS: 20.0000 mg | ORAL_TABLET | Freq: Every day | ORAL | Status: DC

## 2014-07-03 MED ORDER — AMIODARONE HCL 200 MG PO TABS: 400.0000 mg | ORAL_TABLET | Freq: Two times a day (BID) | ORAL | Status: DC

## 2014-07-03 NOTE — Progress Notes (Signed)
Patient ID: Sydney Shaffer, female   DOB: 01-12-1966, 48 y.o.   MRN: [id number]  EP: Dr Caryl Comes Medtronic ICD PCP: [name] (looking for one)  HPI: Sydney Shaffer is a 48 y.o. female with a history of NICM s/p ICD, chronic systolic heart failure and SVT S/P SVT 01/08/14 ablation.    Admitted 02/03/14 with chest pain. through 02/05/14. CEs negative. She had been out of metoprolol. She was discharged on coreg 12.5 mg twice a day, lasix 40mg  twice a day, spiro 12.5 mg daily, and  lisinopril 5 mg daily.  Discharge weight 195 pounds.    ECHO 07/05/12 EF 20-25%  ECHO 02/04/14 EF 20-25%   CPX 06/03/14: FVC 1.69 (51%)  FEV1 1.24 (47%)  FEV1/FVC 74%  Peak VO2: 12.6 (57.8% predicted peak VO2 VE/VCO2 slope: 28.6 OUES: 1.60 Peak RER: 1.01 Peak VO2 adjusted to the patient's ideal body weight of 150 lb (67.8 kg) the peak VO2 is 16.5 ml/kg (ibw)/min (62% of the ibw-adjusted predicted).  Follow up for Heart Failure: Last visit increased lisinopril to 10 mg BID. Today felt awful this am, dizzy, nauseous and fatigue. Past couple of weeks after working the whole week will come home on weekends and do absolutely nothing. Minimal SOB. Denies orthopnea or CP. Weight at home 195 lbs. Following a low salt diet and drinking slightly more than 2L a day. Still working FT.    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 She does not drink alcohol . Lives alone. She has 3 grown children   FH: Mom breast cancer . Father cancer   ROS: All systems negative except as listed in HPI, PMH and Problem List.  Past Medical History  Diagnosis Date  . CHF (congestive heart failure)     Dx 03/2012 - dilated cardiomyopathy with EF 20-25% by echo (abnl nuc but normal coronaries 04/02/12 per cath.   . Anemia     felt to be due to heavy menstrual flow  . Chronic systolic heart failure   . Hypertensive heart disease with CHF   . Hypertension   . Dysrhythmia     Bradycardia  . ICD (implantable cardiac defibrillator)  in place 08/13/2012  . Atrial tachycardia     s/p ablation  . AVNRT (AV nodal re-entry tachycardia)     s/p ablation    Current Outpatient Prescriptions  Medication Sig Dispense Refill  . aspirin EC 81 MG tablet Take 81 mg by mouth daily.      . carvedilol (COREG) 3.125 MG tablet TAKE TWO TABLETS BY MOUTH TWICE DAILY WITH MEALS  60 tablet  2  . Doxylamine Succinate, Sleep, (SLEEP AID PO) Take 1 tablet by mouth at bedtime as needed. For sleep.      . ferrous sulfate 325 (65 FE) MG tablet Take 325 mg by mouth daily with breakfast.      . furosemide (LASIX) 40 MG tablet Take 40 mg by mouth daily.      Marland Kitchen lisinopril (PRINIVIL,ZESTRIL) 10 MG tablet Take 1 tablet (10 mg total) by mouth daily.  180 tablet  3  . Multiple Vitamin (MULTIVITAMIN WITH MINERALS) TABS Take 1 tablet by mouth daily.      . naproxen sodium (ANAPROX) 220 MG tablet Take 220 mg by mouth 2 (two) times daily as needed (pain, headache).      . pantoprazole (PROTONIX) 40 MG tablet Take 1 tablet (40 mg total) by mouth daily at 12 noon.  30 tablet  1  .  potassium chloride SA (K-DUR,KLOR-CON) 20 MEQ tablet Take 1 tablet (20 mEq total) by mouth daily.  90 tablet  3  . spironolactone (ALDACTONE) 25 MG tablet Take 1 tablet (25 mg total) by mouth daily.  90 tablet  3  . traMADol (ULTRAM) 50 MG tablet Take 1 tablet (50 mg total) by mouth every 6 (six) hours as needed for moderate pain.  40 tablet  0   No current facility-administered medications for this encounter.    Filed Vitals:   07/03/14 1517  BP: 116/64  Pulse: 106  Resp: 18  Weight: 196 lb 6 oz (89.075 kg)  SpO2: 97%    PHYSICAL EXAM: General:  Fatigued appearing. No resp difficulty  HEENT: normal Neck: supple. JVP 8-9; Carotids 2+ bilaterally; no bruits. No lymphadenopathy or thryomegaly appreciated. Cor: PMI normal. Tachy & regular rhythm. No rubs, gallops or murmurs. Lungs: clear Abdomen: soft, nontender, nondistended. No hepatosplenomegaly. No bruits or masses. Good  bowel sounds. Extremities: no cyanosis, clubbing, rash, trace edema Neuro: alert & orientedx3, cranial nerves grossly intact. Moves all 4 extremities w/o difficulty. Affect pleasant.   ASSESSMENT & PLAN:  1. Chronic Systolic Heart Failure: NICM s/p Medtronic ICD. EF 20-25% (01/2014) - NYHA III symptoms. Optivol interrogated and volume status trending up, will have her take and extra 20 mg of lasix for 2 days. On Optivol had brief bursts of AT/AF and rep came down to interrogate device (refer below) - Continue coreg 3.125 mg BID, will not titrate with continued fatigue. - Continue Lisinopril 10 mg daily and spironolactone 25 mg daily.  - With continued increased fatigue and NYHA III symptoms will order RHC to assess hemodynamics. Her CPX was suboptimal and she does have some lung restriction. Concerned that she may have low output.  - Reinforced the need and importance of daily weights, a low sodium diet, and fluid restriction (less than 2 L a day). Instructed to call the HF clinic if weight increases more than 3 lbs overnight or 5 lbs in a week.  2. SVT  - s/p ICD 2013 Medtronic; + Palpitations. ICD interrogated and she is having atrial and ventricular ectopy. No treatment. Longest episode lasted for 45 minutes and appeared to start off as VT and then switched to SVT. Will start Amoidarone 400 mg BID for 2 weeks and then 200 mg BID. Start Xarelto 20 mg daily. Will refer back to EP. 3. HTN - Stable. Continue current medications.   F/U 1 month Sydney Brunt NP-C  3:21 PM    Patient seen and examined with Sydney Bame, NP. We discussed all aspects of the encounter. I agree with the assessment and plan as stated above.   She has persistent NYHA III-IIIB symptoms but these seem to be out of proportion to objective findings. CPX with submaximal effort so not diagnostic. VE/VCO2 slope is normal. Agree with plan for RHC. I discussed this with her. Also agree with starting amio or VT/SVT. Will need  f/u with EP.   Optivol and ICD parameters reviewed personally.  Sydney Prosser,MD 9:04 PM

## 2014-07-03 NOTE — Patient Instructions (Signed)
Make follow up with Dr. Virl Axe at [facility] office to discuss recent ICD interrogation that shows arrhythmias [phone] Extension 2  Start amiodarone 400mg  twice daily for 2 weeks, then decrease dosage to 200mg  twice daily.  Start xarelto 20mg  once daily. Use 30 day copay card.  Right heart catheterization this Friday July 31st at 9:00 am (see instruction sheet).  Follow up with our office in 1 month.  Do the following things EVERYDAY: 1) Weigh yourself in the morning before breakfast. Write it down and keep it in a log. 2) Take your medicines as prescribed 3) Eat low salt foods-Limit salt (sodium) to 2000 mg per day.  4) Stay as active as you can everyday 5) Limit all fluids for the day to less than 2 liters

## 2014-07-04 ENCOUNTER — Telehealth (HOSPITAL_COMMUNITY): Payer: Self-pay

## 2014-07-04 NOTE — Telephone Encounter (Signed)
Advised patient to take extra 20mg  lasix once today and once tomorrow in addition to regular dose per [name] NP-C.  Aware and agreeable. [name]

## 2014-07-06 ENCOUNTER — Encounter (HOSPITAL_COMMUNITY): Payer: Self-pay | Admitting: Pharmacy Technician

## 2014-07-06 ENCOUNTER — Telehealth (HOSPITAL_COMMUNITY): Payer: Self-pay | Admitting: Vascular Surgery

## 2014-07-06 NOTE — Telephone Encounter (Signed)
Pt has questions about her cath tomorrow .Marland Kitchen She said that it is Urgent

## 2014-07-06 NOTE — Telephone Encounter (Signed)
Attempted to return call concerning questions about cath tomorrow, please forward to my phone if she returns call [phone]

## 2014-07-07 ENCOUNTER — Ambulatory Visit (HOSPITAL_COMMUNITY): Admission: RE | Admit: 2014-07-07 | Payer: Self-pay | Source: Ambulatory Visit | Admitting: Internal Medicine

## 2014-07-07 ENCOUNTER — Encounter: Payer: Self-pay | Admitting: Internal Medicine

## 2014-07-07 ENCOUNTER — Encounter (HOSPITAL_COMMUNITY): Admission: RE | Payer: Self-pay | Source: Ambulatory Visit

## 2014-07-07 SURGERY — RIGHT HEART CATH
Anesthesia: LOCAL

## 2014-07-14 ENCOUNTER — Encounter (HOSPITAL_COMMUNITY): Payer: Self-pay

## 2014-07-14 ENCOUNTER — Ambulatory Visit (HOSPITAL_COMMUNITY): Admit: 2014-07-14 | Payer: Self-pay | Admitting: Internal Medicine

## 2014-07-14 SURGERY — RIGHT HEART CATH
Anesthesia: LOCAL | Laterality: Right

## 2014-07-22 DIAGNOSIS — I472 Ventricular tachycardia, unspecified: Secondary | ICD-10-CM

## 2014-07-22 HISTORY — DX: Ventricular tachycardia, unspecified: I47.20

## 2014-07-22 NOTE — Addendum Note (Signed)
Encounter addended by: Jolaine Artist, MD on: 07/22/2014  9:06 PM<BR>     Documentation filed: Charges VN, Problem List, Follow-up Section, LOS Section, Notes Section

## 2014-08-03 ENCOUNTER — Inpatient Hospital Stay (HOSPITAL_COMMUNITY): Admission: RE | Admit: 2014-08-03 | Payer: Self-pay | Source: Ambulatory Visit

## 2014-08-03 ENCOUNTER — Encounter (HOSPITAL_COMMUNITY): Payer: Self-pay

## 2014-11-07 ENCOUNTER — Telehealth (HOSPITAL_COMMUNITY): Payer: Self-pay | Admitting: Cardiology

## 2014-11-07 NOTE — Telephone Encounter (Signed)
Pt called with concerns regarding chest pain that radiate into arm and increased LE edema Denies increased SOB Pt does report taking extra diuretics x 3 days to help weight decrease however weight is stable @ 198 (normal 194-195) Pt states this is an emergency and would like to speak directly to Sydney Bame, NP Pt states she does to think this is serious enough to report to ER  Advised pt Deatra Canter Cosgrove,NP is not available however I would speak to next available provider and return her call as soon as possible

## 2014-11-07 NOTE — Telephone Encounter (Signed)
Per vo Ali Cosgrove,NP Pt should report to ER fur further evaluation of chest pains and arm pain Pt can come in for appt 12/2 for further evaluation of LE edema and weight gain  Pt aware and voiced understanding appt 12/2 @ 1145

## 2014-11-08 ENCOUNTER — Encounter (HOSPITAL_COMMUNITY): Payer: Self-pay

## 2014-11-08 ENCOUNTER — Ambulatory Visit (HOSPITAL_COMMUNITY)
Admission: RE | Admit: 2014-11-08 | Discharge: 2014-11-08 | Disposition: A | Discharge status: Home / Self Care | Payer: Self-pay | Source: Ambulatory Visit | Attending: Cardiology | Admitting: Cardiology

## 2014-11-08 VITALS — BP 134/70 | HR 94 | Resp 18 | Wt 199.1 lb

## 2014-11-08 DIAGNOSIS — I429 Cardiomyopathy, unspecified: Secondary | ICD-10-CM

## 2014-11-08 DIAGNOSIS — I471 Supraventricular tachycardia: Secondary | ICD-10-CM

## 2014-11-08 DIAGNOSIS — R079 Chest pain, unspecified: Secondary | ICD-10-CM | POA: Insufficient documentation

## 2014-11-08 DIAGNOSIS — I110 Hypertensive heart disease with heart failure: Secondary | ICD-10-CM

## 2014-11-08 DIAGNOSIS — I509 Heart failure, unspecified: Secondary | ICD-10-CM

## 2014-11-08 DIAGNOSIS — I420 Dilated cardiomyopathy: Secondary | ICD-10-CM

## 2014-11-08 DIAGNOSIS — I10 Essential (primary) hypertension: Secondary | ICD-10-CM | POA: Insufficient documentation

## 2014-11-08 DIAGNOSIS — I5022 Chronic systolic (congestive) heart failure: Secondary | ICD-10-CM

## 2014-11-08 LAB — BASIC METABOLIC PANEL
Anion gap: 13 (ref 5–15)
BUN: 18 mg/dL (ref 6–23)
CHLORIDE: 99 meq/L (ref 96–112)
CO2: 27 meq/L (ref 19–32)
Calcium: 9.5 mg/dL (ref 8.4–10.5)
Creatinine, Ser: 0.8 mg/dL (ref 0.50–1.10)
GFR calc non Af Amer: 85 mL/min — ABNORMAL LOW (ref >90)
Glucose, Bld: 85 mg/dL (ref 70–99)
POTASSIUM: 3.1 meq/L — AB (ref 3.7–5.3)
Sodium: 139 mEq/L (ref 137–147)

## 2014-11-08 LAB — PRO B NATRIURETIC PEPTIDE: Pro B Natriuretic peptide (BNP): 185.1 pg/mL — ABNORMAL HIGH (ref 0–125)

## 2014-11-08 LAB — TROPONIN I

## 2014-11-08 LAB — TSH: TSH: 1.99 u[IU]/mL (ref 0.350–4.500)

## 2014-11-08 LAB — T4, FREE: Free T4: 0.84 ng/dL (ref 0.80–1.80)

## 2014-11-08 MED ORDER — LISINOPRIL 10 MG PO TABS: 10.0000 mg | ORAL_TABLET | Freq: Two times a day (BID) | ORAL | Status: DC

## 2014-11-08 NOTE — Patient Instructions (Signed)
Increase Lisinopril to 10mg  TWICE daily.  Will call you with any abnormal lab results from today's visit.  Return for more lab work in 7-10 days.  Follow up visit in 1 month.  Call us with insurance to schedule cardiac cath.  *GO TO EMERGENCY ROOM IF YOU HAVE CHEST PAIN.  Do the following things EVERYDAY: 1) Weigh yourself in the morning before breakfast. Write it down and keep it in a log. 2) Take your medicines as prescribed 3) Eat low salt foods-Limit salt (sodium) to 2000 mg per day.  4) Stay as active as you can everyday 5) Limit all fluids for the day to less than 2 liters

## 2014-11-08 NOTE — Progress Notes (Addendum)
Patient ID: Sydney Shaffer, female   DOB: Feb 04, 1966, 48 y.o.   MRN: [id number]  EP: Dr Caryl Comes Medtronic ICD PCP: [name] (looking for one)  HPI: Sydney Shaffer is a 48 y.o. female with a history of NICM s/p ICD, HTN, chronic systolic heart failure and SVT S/P SVT 01/08/14 ablation.    Admitted 02/03/14 with chest pain. through 02/05/14. CEs negative. She had been out of metoprolol. She was discharged on coreg 12.5 mg twice a day, lasix 40mg  twice a day, spiro 12.5 mg daily, and  lisinopril 5 mg daily.  Discharge weight 195 pounds.    ECHO 07/05/12 EF 20-25%  ECHO 02/04/14 EF 20-25%   CPX 06/03/14: FVC 1.69 (51%)  FEV1 1.24 (47%)  FEV1/FVC 74%  Peak VO2: 12.6 (57.8% predicted peak VO2 VE/VCO2 slope: 28.6 OUES: 1.60 Peak RER: 1.01 Peak VO2 adjusted to the patient's ideal body weight of 150 lb (67.8 kg) the peak VO2 is 16.5 ml/kg (ibw)/min (62% of the ibw-adjusted predicted).  Acute Visit for CP: Last visit started Amiodarone 200 mg BID for VT/SVT. Patient called yesterday for increased weight gain even with increased diuretics and CP. Last visit scheduled RHC to determine hemodynamics but patient cancelled and never rescheduled. Reports she has not been feeling well since this week very fatigued and SOB. Weight is usually 195 lbs and reports it has increased to 198-199 lbs. +DOE from walking from garage to clinic. Denies dizziness, PND or orthopnea. +minimal LE edema. Has been having some CP that is sharp under L breast that lasts 2-3 minutes and then goes away. Getting rest makes it better. Walking makes it worse. Still working FT. Following a low salt diet and drinking less than 2L a day. + palpitations.   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 She does not drink alcohol . Lives alone. She has 3 grown children   FH: Mom breast cancer . Father cancer   ROS: All systems negative except as listed in HPI, PMH and Problem List.  Past Medical History  Diagnosis Date  . CHF  (congestive heart failure)     Dx 03/2012 - dilated cardiomyopathy with EF 20-25% by echo (abnl nuc but normal coronaries 04/02/12 per cath.   . Anemia     felt to be due to heavy menstrual flow  . Chronic systolic heart failure   . Hypertensive heart disease with CHF   . Hypertension   . Dysrhythmia     Bradycardia  . 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 Atrial tachycardia     s/p ablation  . AVNRT (AV nodal re-entry tachycardia)     s/p ablation    Current Outpatient Prescriptions  Medication Sig Dispense Refill  . amiodarone (PACERONE) 200 MG tablet Take 1 tablet (200 mg total) by mouth 2 (two) times daily. 90 tablet 3  . aspirin EC 81 MG tablet Take 81 mg by mouth daily.    . carvedilol (COREG) 3.125 MG tablet Take 6.25 mg by mouth 2 (two) times daily with a meal.    . Doxylamine Succinate, Sleep, (SLEEP AID PO) Take 1 tablet by mouth at bedtime as needed. For sleep.    . ferrous sulfate 325 (65 FE) MG tablet Take 325 mg by mouth daily with breakfast.    . furosemide (LASIX) 40 MG tablet Take 40 mg by mouth daily.    Marland Kitchen lisinopril (PRINIVIL,ZESTRIL) 10 MG tablet Take 1 tablet (10 mg total) by mouth daily. [place]  tablet 3  . Multiple Vitamin (MULTIVITAMIN WITH MINERALS) TABS Take 1 tablet by mouth daily.    . pantoprazole (PROTONIX) 40 MG tablet Take 1 tablet (40 mg total) by mouth daily at 12 noon. 30 tablet 1  . potassium chloride SA (K-DUR,KLOR-CON) 20 MEQ tablet Take 1 tablet (20 mEq total) by mouth daily. 90 tablet 3  . rivaroxaban (XARELTO) 20 MG TABS tablet Take 1 tablet (20 mg total) by mouth daily with supper. 30 tablet 3  . spironolactone (ALDACTONE) 25 MG tablet Take 1 tablet (25 mg total) by mouth daily. 90 tablet 3   No current facility-administered medications for this encounter.    Filed Vitals:   11/08/14 1153  BP: 134/70  Pulse: 94  Resp: 18  Weight: 199 lb 2 oz (90.323 kg)  SpO2: 97%    PHYSICAL EXAM: General:  Fatigued appearing. No  resp difficulty  HEENT: normal Neck: supple. JVP flat; Carotids 2+ bilaterally; no bruits. No lymphadenopathy or thryomegaly appreciated. Cor: PMI normal. Regular rate and rhythm. No rubs, gallops or murmurs. Lungs: clear Abdomen: soft, nontender, nondistended. No hepatosplenomegaly. No bruits or masses. Good bowel sounds. Extremities: no cyanosis, clubbing, rash, trace edema Neuro: alert & orientedx3, cranial nerves grossly intact. Moves all 4 extremities w/o difficulty. Affect pleasant.  EKG: NSR 76 bpm  ASSESSMENT & PLAN:  1. Chronic Systolic Heart Failure: NICM s/p Medtronic ICD. EF 20-25% (01/2014) - NYHA IIIb symptoms. Optivol interrogated and volume status stable. Thoracic impedence up and no volume below threshold on Optivol. Decreased atrial arrtyhmias but still having some ventricular. Very active 4-6 hrs a day. Continue lasix 40 mg daily. Check BMET and pro-BNP today.  - Continue coreg 3.125 mg BID, will not titrate with continued fatigue. Would like to try corlanor on patient but she currently does not have insurance. She is hoping to have insurance in next 4-6 weeks which at that time would like to try 5 mg BID. - Will increase lisinopril today to 10 mg BID. Check BMET today and in 7-10 days.  - Continue Spironolactone 25 mg daily.  - With continued increased fatigue and NYHA IIIb symptoms would like to do RHC to assess hemodynamics. She currently does not want to do any procedures until she gets insurance and said she would call us to schedule once she has insurance in the next couple of weeks. At time of doing RHC would also do LHC with CP. Last LHC 2013 and coronaries normal.  - Reinforced the need and importance of daily weights, a low sodium diet, and fluid restriction (less than 2 L a day). Instructed to call the HF clinic if weight increases more than 3 lbs overnight or 5 lbs in a week.  2. SVT  - s/p ICD 2013 Medtronic; + Palpitations. Still having some bursts of ventricular  ectopy and some atrial but seems to have decreased on Optivol. Will continue amiodarone 200 mg BID. Check TSH and free T4 today. Follow up with Dr. Caryl Comes this month. Discussed getting yearly eye exams. Continue Xarelto 20 mg daily, provided with some samples today.  3. HTN - Stable. As above increase lisinopril 10 mg BID for afterload reduction, recheck BMET in 7-10 days.  4. CP - Pt had nl cath in 2013. EKG today unremarkable and NSR 76 bpm. Discussed doing myoview stress test but she does not want to currently with no insurance. We also have discussed doing RHC to assess hemodynamics which she is holding off until she has insurance so  when she does obtain would do L/RHC at the same time. Check troponin today.   F/U 1 month [name] B NP-C  12:17 PM

## 2014-11-09 ENCOUNTER — Telehealth: Payer: Self-pay

## 2014-11-09 NOTE — Telephone Encounter (Signed)
Multiple attempts made to reach patient regarding lab work from 11/08/14.  Per [name] NP-C, instructed patient via VM to take EXTRA 70meq of potassium today only on top of normal daily dose for K of 3.1.  Asked her to call us back to let us know she got our message.  Will try to reach her again tomorrow. [name]

## 2014-11-09 NOTE — Addendum Note (Signed)
Encounter addended by: Asencion Gowda, CCT on: 11/09/2014  9:27 AM<BR>     Documentation filed: Charges VN

## 2014-11-16 ENCOUNTER — Encounter (HOSPITAL_COMMUNITY): Payer: Self-pay | Admitting: Cardiology

## 2014-11-21 ENCOUNTER — Other Ambulatory Visit (HOSPITAL_COMMUNITY): Payer: Self-pay

## 2014-11-28 ENCOUNTER — Ambulatory Visit (HOSPITAL_COMMUNITY)
Admission: RE | Admit: 2014-11-28 | Discharge: 2014-11-28 | Disposition: A | Discharge status: Home / Self Care | Payer: Self-pay | Source: Ambulatory Visit | Attending: Internal Medicine | Admitting: Internal Medicine

## 2014-11-28 DIAGNOSIS — I5022 Chronic systolic (congestive) heart failure: Secondary | ICD-10-CM

## 2014-11-28 LAB — BASIC METABOLIC PANEL
Anion gap: 9 (ref 5–15)
BUN: 20 mg/dL (ref 6–23)
CO2: 29 mmol/L (ref 19–32)
Calcium: 9.6 mg/dL (ref 8.4–10.5)
Chloride: 97 mEq/L (ref 96–112)
Creatinine, Ser: 1.13 mg/dL — ABNORMAL HIGH (ref 0.50–1.10)
GFR, EST AFRICAN AMERICAN: 65 mL/min — AB (ref >90)
GFR, EST NON AFRICAN AMERICAN: 56 mL/min — AB (ref >90)
Glucose, Bld: 110 mg/dL — ABNORMAL HIGH (ref 70–99)
POTASSIUM: 3.3 mmol/L — AB (ref 3.5–5.1)
Sodium: 135 mmol/L (ref 135–145)

## 2014-11-29 ENCOUNTER — Telehealth (HOSPITAL_COMMUNITY): Payer: Self-pay

## 2014-11-29 MED ORDER — POTASSIUM CHLORIDE CRYS ER 20 MEQ PO TBCR: 30.0000 meq | EXTENDED_RELEASE_TABLET | Freq: Every day | ORAL | Status: DC

## 2014-11-29 NOTE — Telephone Encounter (Signed)
Detailed VM left for patient regarding lab results.  Instructed to increase potassium to 32meq (1.5 tabs) once daily.  Left callback number for further questions.  [name]

## 2014-12-05 ENCOUNTER — Telehealth (HOSPITAL_COMMUNITY): Payer: Self-pay | Admitting: Vascular Surgery

## 2014-12-05 NOTE — Telephone Encounter (Signed)
PT called pt has a dime size limp in the back of leg she wants to come in to get checked to make sure its not a clot.. Please advise

## 2014-12-07 ENCOUNTER — Encounter: Payer: Self-pay | Admitting: Internal Medicine

## 2014-12-07 NOTE — Telephone Encounter (Signed)
Left detailed VM.  Advised to seek care for PCP as this is not heart failure related.  [name]

## 2014-12-11 ENCOUNTER — Encounter (HOSPITAL_COMMUNITY): Payer: Self-pay

## 2014-12-22 ENCOUNTER — Telehealth (HOSPITAL_COMMUNITY): Payer: Self-pay | Admitting: *Deleted

## 2014-12-22 ENCOUNTER — Ambulatory Visit (HOSPITAL_COMMUNITY)
Admission: RE | Admit: 2014-12-22 | Discharge: 2014-12-22 | Disposition: A | Discharge status: Home / Self Care | Payer: Self-pay | Source: Ambulatory Visit | Attending: Internal Medicine | Admitting: Internal Medicine

## 2014-12-22 VITALS — BP 122/68 | HR 90 | Wt 198.8 lb

## 2014-12-22 DIAGNOSIS — D649 Anemia, unspecified: Secondary | ICD-10-CM | POA: Insufficient documentation

## 2014-12-22 DIAGNOSIS — I471 Supraventricular tachycardia, unspecified: Secondary | ICD-10-CM

## 2014-12-22 DIAGNOSIS — I10 Essential (primary) hypertension: Secondary | ICD-10-CM | POA: Insufficient documentation

## 2014-12-22 DIAGNOSIS — I158 Other secondary hypertension: Secondary | ICD-10-CM

## 2014-12-22 DIAGNOSIS — I5022 Chronic systolic (congestive) heart failure: Secondary | ICD-10-CM

## 2014-12-22 DIAGNOSIS — R079 Chest pain, unspecified: Secondary | ICD-10-CM | POA: Insufficient documentation

## 2014-12-22 DIAGNOSIS — D508 Other iron deficiency anemias: Secondary | ICD-10-CM

## 2014-12-22 LAB — BASIC METABOLIC PANEL
ANION GAP: 12 (ref 5–15)
BUN: 14 mg/dL (ref 6–23)
CALCIUM: 9.6 mg/dL (ref 8.4–10.5)
CHLORIDE: 103 meq/L (ref 96–112)
CO2: 24 mmol/L (ref 19–32)
CREATININE: 0.92 mg/dL (ref 0.50–1.10)
GFR calc Af Amer: 83 mL/min — ABNORMAL LOW (ref >90)
GFR, EST NON AFRICAN AMERICAN: 72 mL/min — AB (ref >90)
Glucose, Bld: 109 mg/dL — ABNORMAL HIGH (ref 70–99)
Potassium: 3.5 mmol/L (ref 3.5–5.1)
Sodium: 139 mmol/L (ref 135–145)

## 2014-12-22 LAB — CBC
HCT: 26.4 % — ABNORMAL LOW (ref 36.0–46.0)
Hemoglobin: 8.2 g/dL — ABNORMAL LOW (ref 12.0–15.0)
MCH: 22.7 pg — AB (ref 26.0–34.0)
MCHC: 31.1 g/dL (ref 30.0–36.0)
MCV: 72.9 fL — ABNORMAL LOW (ref 78.0–100.0)
PLATELETS: 470 10*3/uL — AB (ref 150–400)
RBC: 3.62 MIL/uL — AB (ref 3.87–5.11)
RDW: 17.7 % — AB (ref 11.5–15.5)
WBC: 4.9 10*3/uL (ref 4.0–10.5)

## 2014-12-22 LAB — BRAIN NATRIURETIC PEPTIDE: B Natriuretic Peptide: 81.2 pg/mL (ref 0.0–100.0)

## 2014-12-22 MED ORDER — LISINOPRIL 10 MG PO TABS: ORAL_TABLET | ORAL | Status: DC

## 2014-12-22 NOTE — Telephone Encounter (Signed)
-----   Message from Conrad Tull, NP sent at 12/22/2014 11:16 AM EST ----- Please call and instruct to stop aspirin and xarelto. Please set up follow up with Dr Caryl Comes. She needs follow up PCP for anemia.

## 2014-12-22 NOTE — Progress Notes (Signed)
Patient ID: Sydney Shaffer, female   DOB: 03/16/1966, 49 y.o.   MRN: [id number]  EP: Dr Caryl Comes Medtronic ICD PCP: None   HPI: Sydney Shaffer is a 49 y.o. female with a history of NICM s/p ICD, HTN, chronic systolic heart failure and SVT S/P SVT 01/08/14 ablation.    Admitted 02/03/14 with chest pain. through 02/05/14. CEs negative. She had been out of metoprolol. She was discharged on coreg 12.5 mg twice a day, lasix 40mg  twice a day, spiro 12.5 mg daily, and  lisinopril 5 mg daily.  Discharge weight 195 pounds.    She returns for follow up: Last visit lisinopril was increased to 10 mg twice a day. Ongoing fatigue. Able to walk around the grocery store but takes her time. Walks 45 minutes about 3 times week. Sleeps on 2 pillows. Occasional PND. Denies BRBPR. Weight at home 187-190 pounds.  Only taking carvedilol once a day. Says she cut back to once a day due to fatigue. Appetite ok. Following low slat diet. Working full time.   ECHO 07/05/12 EF 20-25%  ECHO 02/04/14 EF 20-25%   CPX 06/03/14: FVC 1.69 (51%)  FEV1 1.24 (47%)  FEV1/FVC 74%  Peak VO2: 12.6 (57.8% predicted peak VO2 VE/VCO2 slope: 28.6 OUES: 1.60 Peak RER: 1.01 Peak VO2 adjusted to the patient's ideal body weight of 150 lb (67.8 kg) the peak VO2 is 16.5 ml/kg (ibw)/min (62% of the ibw-adjusted predicted).  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 She does not drink alcohol . Lives alone. She has 3 grown children   FH: Mom breast cancer . Father cancer   ROS: All systems negative except as listed in HPI, PMH and Problem List.  Past Medical History  Diagnosis Date  . CHF (congestive heart failure)     Dx 03/2012 - dilated cardiomyopathy with EF 20-25% by echo (abnl nuc but normal coronaries 04/02/12 per cath.   . Anemia     felt to be due to heavy menstrual flow  . Chronic systolic heart failure   .  Hypertensive heart disease with CHF   . Hypertension   . Dysrhythmia     Bradycardia  . 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 Atrial tachycardia     s/p ablation  . AVNRT (AV nodal re-entry tachycardia)     s/p ablation    Current Outpatient Prescriptions  Medication Sig Dispense Refill  . amiodarone (PACERONE) 200 MG tablet Take 1 tablet (200 mg total) by mouth 2 (two) times daily. 90 tablet 3  . aspirin EC 81 MG tablet Take 81 mg by mouth daily.    . carvedilol (COREG) 3.125 MG tablet Take 3.125 mg by mouth daily.     . Doxylamine Succinate, Sleep, (SLEEP AID PO) Take 1 tablet by mouth at bedtime as needed. For sleep.    . ferrous sulfate 325 (65 FE) MG tablet Take 325 mg by mouth daily with breakfast.    . furosemide (LASIX) 40 MG tablet Take 40 mg by mouth 2 (two) times daily.     Marland Kitchen lisinopril (PRINIVIL,ZESTRIL) 10 MG tablet Take 1 tablet (10 mg total) by mouth 2 (two) times daily. 180 tablet 3  . Multiple Vitamin (MULTIVITAMIN WITH MINERALS) TABS Take 1 tablet by mouth daily.    . Multiple Vitamins-Minerals (HAIR/SKIN/NAILS) TABS Take 1 tablet by mouth daily.    Marland Kitchen  pantoprazole (PROTONIX) 40 MG tablet Take 1 tablet (40 mg total) by mouth daily at 12 noon. 30 tablet 1  . potassium chloride SA (K-DUR,KLOR-CON) 20 MEQ tablet Take 1.5 tablets (30 mEq total) by mouth daily. (Patient taking differently: Take 20 mEq by mouth daily. ) 135 tablet 3  . rivaroxaban (XARELTO) 20 MG TABS tablet Take 1 tablet (20 mg total) by mouth daily with supper. 30 tablet 3  . spironolactone (ALDACTONE) 25 MG tablet Take 1 tablet (25 mg total) by mouth daily. 90 tablet 3   No current facility-administered medications for this encounter.    Filed Vitals:   12/22/14 0818  BP: 122/68  Pulse: 90  Weight: 198 lb 12 oz (90.152 kg)  SpO2: 98%    PHYSICAL EXAM: General:  Fatigued appearing. No resp difficulty  HEENT: normal Neck: supple. JVP flat; Carotids 2+ bilaterally; no  bruits. No lymphadenopathy or thryomegaly appreciated. Cor: PMI normal. Regular rate and rhythm. No rubs, gallops or murmurs. Lungs: clear Abdomen: soft, nontender, nondistended. No hepatosplenomegaly. No bruits or masses. Good bowel sounds. Extremities: no cyanosis, clubbing, rash, no edema Neuro: alert & orientedx3, cranial nerves grossly intact. Moves all 4 extremities w/o difficulty. Affect pleasant.   ASSESSMENT & PLAN:  1. Chronic Systolic Heart Failure: [facility] Cath 2013. NICM s/p Medtronic ICD. EF 20-25% (01/2014) - NYHA IIIb symptoms. Having ongoing fatigue. Continue lasix 40 mg daily.   - Only taking coreg 3.125 mg once a day. Would like her to take twice a day. Says she is going try and take it again.  Can consider corlanor at next appointment when she has insurance.    - Continue lisinopril 10 mg and increase pm dose to 20 mg   - Continue Spironolactone 25 mg daily.   - Reinforced the need and importance of daily weights, a low sodium diet, and fluid restriction (less than 2 L a day). Instructed to call the HF clinic if weight increases more than 3 lbs overnight or 5 lbs in a week.  2.  SVT - Had ablation 01/2014.  s/p ICD 2013 Medtronic; Continue amiodarone 200 mg BID. TSH ok 11/08/14. Discussed getting yearly eye exams. Stop  Xarelto and aspirin for now with drop in hemoglobin. Discussed with Dr Aundra Dubin. Will send message to Dr Caryl Comes regarding anticoagulation.   3. HTN- Stable. As above continue  lisinopril 10 mg in am and 20 mg in pm 4. CP- Normal cath 2013. Consider Myoview if she has ongoing chest pain. She again does not want to pursue any test until she has insurance.  Follow up 4 weeks . Refer to HF SW for PCP . 5. Anemia- On iron. Hemoglobin down to 8.2  today. Needs evaluation by PCP. No bleeding problems noted. Stop Xarelto and Aspirin today.    Follow up in 4 weeks. Will ask SW to refer to PCP ASAP.   Sydney Caravello NP-C  8:39 AM

## 2014-12-22 NOTE — Telephone Encounter (Signed)
Pt aware and agreeable.  

## 2014-12-22 NOTE — Patient Instructions (Signed)
Increase Lisinopril to 10 mg (1 tab) in AM and 20 mg (2 tabs) in PM  Labs today  Your physician recommends that you schedule a follow-up appointment in: 4 weeks

## 2014-12-23 ENCOUNTER — Other Ambulatory Visit: Payer: Self-pay | Admitting: Physician Assistant

## 2014-12-23 DIAGNOSIS — I420 Dilated cardiomyopathy: Secondary | ICD-10-CM

## 2014-12-23 DIAGNOSIS — I5022 Chronic systolic (congestive) heart failure: Secondary | ICD-10-CM

## 2014-12-23 MED ORDER — LISINOPRIL 10 MG PO TABS: ORAL_TABLET | ORAL | Status: DC

## 2014-12-23 MED ORDER — FUROSEMIDE 40 MG PO TABS: 40.0000 mg | ORAL_TABLET | Freq: Two times a day (BID) | ORAL | Status: DC

## 2014-12-23 MED ORDER — CARVEDILOL 3.125 MG PO TABS: 3.1250 mg | ORAL_TABLET | Freq: Two times a day (BID) | ORAL | Status: DC

## 2014-12-25 ENCOUNTER — Telehealth (HOSPITAL_COMMUNITY): Payer: Self-pay | Admitting: Vascular Surgery

## 2014-12-26 ENCOUNTER — Telehealth: Payer: Self-pay | Admitting: Licensed Clinical Social Worker

## 2014-12-26 NOTE — Telephone Encounter (Signed)
CSW received referral to assist with obtaining a PCP. CSW contacted patient and left message for return call. Raquel Sarna, [place]

## 2014-12-28 NOTE — Telephone Encounter (Signed)
Open in error

## 2015-02-13 ENCOUNTER — Ambulatory Visit (HOSPITAL_COMMUNITY)
Admission: RE | Admit: 2015-02-13 | Discharge: 2015-02-13 | Disposition: A | Discharge status: Home / Self Care | Payer: No Typology Code available for payment source | Source: Ambulatory Visit | Attending: Cardiology | Admitting: Cardiology

## 2015-02-13 DIAGNOSIS — I471 Supraventricular tachycardia: Secondary | ICD-10-CM | POA: Insufficient documentation

## 2015-02-13 DIAGNOSIS — R0600 Dyspnea, unspecified: Secondary | ICD-10-CM

## 2015-02-13 DIAGNOSIS — Z95810 Presence of automatic (implantable) cardiac defibrillator: Secondary | ICD-10-CM | POA: Insufficient documentation

## 2015-02-13 DIAGNOSIS — Z79899 Other long term (current) drug therapy: Secondary | ICD-10-CM | POA: Diagnosis not present

## 2015-02-13 DIAGNOSIS — I429 Cardiomyopathy, unspecified: Secondary | ICD-10-CM | POA: Insufficient documentation

## 2015-02-13 DIAGNOSIS — I110 Hypertensive heart disease with heart failure: Secondary | ICD-10-CM | POA: Diagnosis not present

## 2015-02-13 DIAGNOSIS — I5022 Chronic systolic (congestive) heart failure: Secondary | ICD-10-CM | POA: Insufficient documentation

## 2015-02-13 DIAGNOSIS — D649 Anemia, unspecified: Secondary | ICD-10-CM | POA: Diagnosis not present

## 2015-02-13 LAB — COMPREHENSIVE METABOLIC PANEL
ALT: 19 U/L (ref 0–35)
ANION GAP: 8 (ref 5–15)
AST: 23 U/L (ref 0–37)
Albumin: 3.8 g/dL (ref 3.5–5.2)
Alkaline Phosphatase: 71 U/L (ref 39–117)
BUN: 17 mg/dL (ref 6–23)
CALCIUM: 9.7 mg/dL (ref 8.4–10.5)
CO2: 26 mmol/L (ref 19–32)
CREATININE: 0.91 mg/dL (ref 0.50–1.10)
Chloride: 101 mmol/L (ref 96–112)
GFR calc Af Amer: 84 mL/min — ABNORMAL LOW (ref >90)
GFR calc non Af Amer: 73 mL/min — ABNORMAL LOW (ref >90)
Glucose, Bld: 101 mg/dL — ABNORMAL HIGH (ref 70–99)
Potassium: 3.9 mmol/L (ref 3.5–5.1)
Sodium: 135 mmol/L (ref 135–145)
Total Bilirubin: 0.3 mg/dL (ref 0.3–1.2)
Total Protein: 8 g/dL (ref 6.0–8.3)

## 2015-02-13 LAB — CBC
HCT: 28.8 % — ABNORMAL LOW (ref 36.0–46.0)
Hemoglobin: 8.6 g/dL — ABNORMAL LOW (ref 12.0–15.0)
MCH: 21.1 pg — AB (ref 26.0–34.0)
MCHC: 29.9 g/dL — ABNORMAL LOW (ref 30.0–36.0)
MCV: 70.6 fL — ABNORMAL LOW (ref 78.0–100.0)
PLATELETS: 454 10*3/uL — AB (ref 150–400)
RBC: 4.08 MIL/uL (ref 3.87–5.11)
RDW: 18.5 % — AB (ref 11.5–15.5)
WBC: 7.1 10*3/uL (ref 4.0–10.5)

## 2015-02-13 LAB — BRAIN NATRIURETIC PEPTIDE: B NATRIURETIC PEPTIDE 5: 78.6 pg/mL (ref 0.0–100.0)

## 2015-02-13 LAB — TSH: TSH: 3.131 u[IU]/mL (ref 0.350–4.500)

## 2015-02-13 MED ORDER — POTASSIUM CHLORIDE CRYS ER 20 MEQ PO TBCR: 40.0000 meq | EXTENDED_RELEASE_TABLET | Freq: Every day | ORAL | Status: DC

## 2015-02-13 MED ORDER — POTASSIUM CHLORIDE CRYS ER 20 MEQ PO TBCR: 30.0000 meq | EXTENDED_RELEASE_TABLET | Freq: Every day | ORAL | Status: DC

## 2015-02-13 MED ORDER — FUROSEMIDE 40 MG PO TABS: 60.0000 mg | ORAL_TABLET | Freq: Two times a day (BID) | ORAL | Status: DC

## 2015-02-13 NOTE — Patient Instructions (Addendum)
Labs today and again in 1 week (BMET)  INCREASE lasix  to 60 mg twice a day INCREASE Potassium to 40 mEQ daily  Your physician recommends that you schedule a follow-up appointment in: 1 month  Do the following things EVERYDAY: 1) Weigh yourself in the morning before breakfast. Write it down and keep it in a log. 2) Take your medicines as prescribed 3) Eat low salt foods-Limit salt (sodium) to 2000 mg per day.  4) Stay as active as you can everyday 5) Limit all fluids for the day to less than 2 liters 6)

## 2015-02-14 NOTE — Progress Notes (Signed)
Patient ID: Sydney Shaffer, female   DOB: 1966-06-25, 49 y.o.   MRN: [id number] EP: Dr Caryl Comes Medtronic ICD PCP: None   HPI: Sydney Shaffer is a 49 y.o. female with a history of nonischemic cardiomyopathy s/p Medtronic ICD, HTN, chronic systolic heart failure and SVT s/p 01/08/14 ablation.    Admitted 02/03/14 with chest pain through 02/05/14. Troponin negative. She had been out of metoprolol. She was discharged on coreg 12.5 mg twice a day, lasix 40 mg twice a day, spiro 12.5 mg daily, and lisinopril 5 mg daily.  Discharge weight 195 pounds.    She had been on Xarelto but this has been stopped due to anemia from heavy periods.  She has dyspnea after walking for 10-15 minutes or walking up a flight of steps.  No chest pain.  Rare fluttering.  Weight is up 4 lbs.   Optivol checked today.  Fluid index > threshold, impedance decreased.   ECHO 07/05/12 EF 20-25%  ECHO 02/04/14 EF 20-25%   CPX 06/03/14: FVC 1.69 (51%)  FEV1 1.24 (47%)  FEV1/FVC 74%  Peak VO2: 12.6 (57.8% predicted peak VO2 VE/VCO2 slope: 28.6 OUES: 1.60 Peak RER: 1.01 Peak VO2 adjusted to the patient's ideal body weight of 150 lb (67.8 kg) the peak VO2 is 16.5 ml/kg (ibw)/min (62% of the ibw-adjusted predicted).  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 She does not drink alcohol . Lives alone. She has 3 grown children   FH: Mom breast cancer . Father cancer   ROS: All systems negative except as listed in HPI, PMH and Problem List.  Past Medical History  Diagnosis Date  . CHF (congestive heart failure)     Dx 03/2012 - dilated cardiomyopathy with EF 20-25% by echo (abnl nuc but normal coronaries 04/02/12 per cath.   . Anemia     felt to be due to heavy menstrual flow  . Chronic systolic heart failure   . Hypertensive heart disease with CHF   . Hypertension   .  Dysrhythmia     Bradycardia  . 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 Atrial tachycardia     s/p ablation  . AVNRT (AV nodal re-entry tachycardia)     s/p ablation    Current Outpatient Prescriptions  Medication Sig Dispense Refill  . amiodarone (PACERONE) 200 MG tablet Take 1 tablet (200 mg total) by mouth 2 (two) times daily. 90 tablet 3  . carvedilol (COREG) 3.125 MG tablet Take 1 tablet (3.125 mg total) by mouth 2 (two) times daily with a meal. 60 tablet 5  . Doxylamine Succinate, Sleep, (SLEEP AID PO) Take 1 tablet by mouth at bedtime as needed. For sleep.    . ferrous sulfate 325 (65 FE) MG tablet Take 325 mg by mouth daily with breakfast.    . furosemide (LASIX) 40 MG tablet Take 1.5 tablets (60 mg total) by mouth 2 (two) times daily. 90 tablet 5  . lisinopril (PRINIVIL,ZESTRIL) 10 MG tablet Take 1 tab in AM and 2 tabs in PM 90 tablet 5  . Multiple Vitamin (MULTIVITAMIN WITH MINERALS) TABS Take 1 tablet by mouth daily.    . Multiple Vitamins-Minerals (HAIR/SKIN/NAILS) TABS Take 1 tablet by mouth daily.    . pantoprazole (PROTONIX) 40 MG tablet Take 1 tablet (40 mg total) by mouth  daily at 12 noon. 30 tablet 1  . potassium chloride SA (K-DUR,KLOR-CON) 20 MEQ tablet Take 2 tablets (40 mEq total) by mouth daily. 60 tablet 3  . spironolactone (ALDACTONE) 25 MG tablet Take 1 tablet (25 mg total) by mouth daily. 90 tablet 3   No current facility-administered medications for this encounter.    Filed Vitals:   02/13/15 1539  BP: 102/54  Pulse: 88  Weight: 202 lb (91.627 kg)  SpO2: 100%   PHYSICAL EXAM: General:  Fatigued appearing. No resp difficulty  HEENT: normal Neck: supple. JVP does not appear elevated; Carotids 2+ bilaterally; no bruits. No lymphadenopathy or thryomegaly appreciated. Cor: PMI normal. Regular rate and rhythm. No rubs, gallops or murmurs. Lungs: clear Abdomen: soft, nontender, nondistended. No hepatosplenomegaly. No bruits or masses.  Good bowel sounds. Extremities: no cyanosis, clubbing, rash, no edema Neuro: alert & orientedx3, cranial nerves grossly intact. Moves all 4 extremities w/o difficulty. Affect pleasant.   ASSESSMENT & PLAN:  1. Chronic Systolic Heart Failure: Normal coronaries by cath 2013. Nonischemic cardiomyopathy s/p Medtronic ICD. EF 20-25% (01/2014 echo).  NYHA class II-III symptoms.  She does not appear significantly volume overloaded on exam, but Optivol suggests elevated volume.   - Increase Lasix to 60 mg bid, increase KCl to 40 daily. Check BMET/BNP today and again in 1 week.   - Continue Coreg, lisinopril, and spironolactone at current doses.  - Reinforced the need and importance of daily weights, a low sodium diet, and fluid restriction (less than 2 L a day). Instructed to call the HF clinic if weight increases more than 3 lbs overnight or 5 lbs in a week.  2. SVT: Had ablation 01/2014.  Continue amiodarone, will need to see if Dr Caryl Comes is ok with decreasing amiodarone to 200 mg daily. Check LFTs/TSH today.  Needs yearly eye exam with amiodarone use.  3. Anemia:  On iron.  Suspect due to heavy periods.  Now off Xarelto.  Will check CBC today.    Sydney Shaffer 02/14/2015

## 2015-03-13 ENCOUNTER — Encounter (HOSPITAL_COMMUNITY): Payer: No Typology Code available for payment source

## 2015-03-15 ENCOUNTER — Encounter: Payer: Self-pay | Admitting: Internal Medicine

## 2015-03-15 ENCOUNTER — Ambulatory Visit (INDEPENDENT_AMBULATORY_CARE_PROVIDER_SITE_OTHER): Payer: No Typology Code available for payment source | Admitting: Internal Medicine

## 2015-03-15 VITALS — BP 116/64 | HR 86 | Ht 66.0 in | Wt 204.4 lb

## 2015-03-15 DIAGNOSIS — I472 Ventricular tachycardia, unspecified: Secondary | ICD-10-CM

## 2015-03-15 DIAGNOSIS — I5022 Chronic systolic (congestive) heart failure: Secondary | ICD-10-CM

## 2015-03-15 DIAGNOSIS — I429 Cardiomyopathy, unspecified: Secondary | ICD-10-CM

## 2015-03-15 DIAGNOSIS — Z4502 Encounter for adjustment and management of automatic implantable cardiac defibrillator: Secondary | ICD-10-CM

## 2015-03-15 DIAGNOSIS — I420 Dilated cardiomyopathy: Secondary | ICD-10-CM

## 2015-03-15 LAB — MDC_IDC_ENUM_SESS_TYPE_INCLINIC
Battery Voltage: 3.11 V
Brady Statistic AP VP Percent: 0 %
Brady Statistic AP VS Percent: 0.03 %
Brady Statistic AS VP Percent: 0.03 %
Brady Statistic RV Percent Paced: 0.04 %
HighPow Impedance: 19 Ohm
HighPow Impedance: 342 Ohm
HighPow Impedance: 67 Ohm
Lead Channel Pacing Threshold Amplitude: 0.5 V
Lead Channel Pacing Threshold Amplitude: 1 V
Lead Channel Sensing Intrinsic Amplitude: 1.5 mV
Lead Channel Setting Pacing Amplitude: 2 V
Lead Channel Setting Pacing Amplitude: 2.5 V
Lead Channel Setting Pacing Pulse Width: 0.4 ms
MDC IDC MSMT LEADCHNL RA IMPEDANCE VALUE: 361 Ohm
MDC IDC MSMT LEADCHNL RA PACING THRESHOLD PULSEWIDTH: 0.4 ms
MDC IDC MSMT LEADCHNL RV IMPEDANCE VALUE: 342 Ohm
MDC IDC MSMT LEADCHNL RV PACING THRESHOLD PULSEWIDTH: 0.4 ms
MDC IDC MSMT LEADCHNL RV SENSING INTR AMPL: 9.125 mV
MDC IDC SESS DTM: 20160407100250
MDC IDC SET LEADCHNL RV SENSING SENSITIVITY: 0.3 mV
MDC IDC SET ZONE DETECTION INTERVAL: 270 ms
MDC IDC STAT BRADY AS VS PERCENT: 99.94 %
MDC IDC STAT BRADY RA PERCENT PACED: 0.03 %
Zone Setting Detection Interval: 240 ms
Zone Setting Detection Interval: 280 ms
Zone Setting Detection Interval: 350 ms
Zone Setting Detection Interval: 350 ms

## 2015-03-15 NOTE — Progress Notes (Signed)
Patient Care Team: Provider Default, MD as PCP - General (Orthopedic Surgery)   HPI  Sydney Shaffer is a 49 y.o. female Seen in followup for ICD implanted Sept 2013 for primary prevention in the setting of nonischemic cardiomyopathy. QRS was narrow.   She has episodes of increasing edema. These are associated with notable swelling which she addresses by taking increased diuretics.  She's had a couple of episodes of ventricular tachycardia identified on the device. She has no associated symptoms.  She has a history of atrial tachycardia ablation. This was noted then approximated of the AV node ECHO 07/05/12 EF 20-25%  ECHO 02/04/14 EF 20-25%    Past Medical History  Diagnosis Date  . CHF (congestive heart failure)     Dx 03/2012 - dilated cardiomyopathy with EF 20-25% by echo (abnl nuc but normal coronaries 04/02/12 per cath.   . Anemia     felt to be due to heavy menstrual flow  . Chronic systolic heart failure   . Hypertensive heart disease with CHF   . Hypertension   . Dysrhythmia     Bradycardia  . 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 Atrial tachycardia     s/p ablation  . 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 Tubal ligation    . Appendectomy    . Cardiac catheterization  April 2013    normal coronaries  . Icd  08/13/2012  . Ep study and ablation  01/07/13    Ablation of AVNRT and atrial tachycardia (arising from the anteroseptal RA 26mm above the HIS)  . Left heart catheterization with coronary angiogram [name] 04/02/2012    Procedure: LEFT HEART CATHETERIZATION WITH CORONARY ANGIOGRAM;  Surgeon: Peter M Martinique, MD;  Location: [facility] CATH LAB;  Service: Cardiovascular;  Laterality: [name];  . Implantable cardioverter defibrillator implant [name] 08/13/2012    Procedure: IMPLANTABLE CARDIOVERTER DEFIBRILLATOR IMPLANT;  Surgeon: Deboraha Sprang, MD;  Location: [facility] CATH LAB;  Service:  Cardiovascular;  Laterality: [name];  . Supraventricular tachycardia ablation [name] 01/07/2013    Procedure: SUPRAVENTRICULAR TACHYCARDIA ABLATION;  Surgeon: Thompson Grayer, MD;  Location: [facility] CATH LAB;  Service: Cardiovascular;  Laterality: [name];    Current Outpatient Prescriptions  Medication Sig Dispense Refill  . amiodarone (PACERONE) 200 MG tablet Take 1 tablet (200 mg total) by mouth 2 (two) times daily. 90 tablet 3  . carvedilol (COREG) 3.125 MG tablet Take 1 tablet (3.125 mg total) by mouth 2 (two) times daily with a meal. 60 tablet 5  . Doxylamine Succinate, Sleep, (SLEEP AID PO) Take 1 tablet by mouth at bedtime as needed. For sleep.    . ferrous sulfate 325 (65 FE) MG tablet Take 325 mg by mouth daily with breakfast.    . furosemide (LASIX) 40 MG tablet Take 1.5 tablets (60 mg total) by mouth 2 (two) times daily. 90 tablet 5  . lisinopril (PRINIVIL,ZESTRIL) 10 MG tablet Take 1 tab in AM and 2 tabs in PM 90 tablet 5  . Multiple Vitamin (MULTIVITAMIN WITH MINERALS) TABS Take 1 tablet by mouth daily.    . Multiple Vitamins-Minerals (HAIR/SKIN/NAILS) TABS Take 1 tablet by mouth daily.    . pantoprazole (PROTONIX) 40 MG tablet Take 1 tablet (40 mg total) by mouth daily at 12 noon. 30 tablet 1  . potassium chloride SA (K-DUR,KLOR-CON) 20 MEQ tablet Take 2 tablets (40 mEq total) by mouth  daily. 60 tablet 3  . spironolactone (ALDACTONE) 25 MG tablet Take 1 tablet (25 mg total) by mouth daily. 90 tablet 3   No current facility-administered medications for this visit.    No Known Allergies  Review of Systems negative except from HPI and PMH  Physical Exam BP 116/64 mmHg  Pulse 86  Ht 5\' 6"  (1.676 m)  Wt 204 lb 6.4 oz (92.715 kg)  BMI 33.01 kg/m2 Well developed and well nourished in no acute distress HENT normal E scleral and icterus clear Neck Supple JVP flat; carotids brisk and full Clear to ausculation    Regular rate and rhythm, no murmurs gallops or rub Soft with active bowel  sounds No clubbing cyanosis  Edema Alert and oriented, grossly normal motor and sensory function Skin Warm and Dry    Assessment and  Plan  Nonischemic cardiomyopathy  Atrial Tachycardia/Flutter ([id number])  ICD Medtronic The patient's device was interrogated and the information was fully reviewed.  The device was reprogrammed to  Lower rate limit from 60--50  CHF chronic systolic  Ventricular tachycardia-pace terminated  Anemia    She has episodes with her congestive failure that are episodic and she is taking increased diuretics couple of times a week in response to this. I wonder whether we couldn't be proactive increase her diuretic regime a little bit so as to try to even out her symptoms and avoid the "catch up". I will discuss this with Dr. DM.  She's also had recurrent ventricular tachycardia with pace termination on the first occasion. There've been 5 episodes none in the last few months. We will keep a close eye on this. I would not adjust medications on this as blood pressure is somewhat limiting to beta blocker up titration  She is euvolemic.  No intercurrent atrial arrhythmias.  Amiodarone surveillance laboratories were normal 3/16.

## 2015-03-15 NOTE — Patient Instructions (Addendum)
Medication Instructions:  Your physician recommends that you continue on your current medications as directed. Please refer to the Current Medication list given to you today.  Labwork: None  Testing/Procedures: None  Follow-Up: Remote monitoring is used to monitor your Pacemaker of ICD from home. This monitoring reduces the number of office visits required to check your device to one time per year. It allows [place] to keep an eye on the functioning of your device to ensure it is working properly. You are scheduled for a device check from home on 06/14/15. You may send your transmission at any time that day. If you have a wireless device, the transmission will be sent automatically. After your physician reviews your transmission, you will receive a postcard with your next transmission date.   Your physician wants you to follow-up in: 1 year with Dr. Caryl Comes.  You will receive a reminder letter in the mail two months in advance. If you don't receive a letter, please call our office to schedule the follow-up appointment.

## 2015-03-22 ENCOUNTER — Encounter: Payer: Self-pay | Admitting: Internal Medicine

## 2015-03-27 ENCOUNTER — Ambulatory Visit (HOSPITAL_COMMUNITY)
Admission: RE | Admit: 2015-03-27 | Discharge: 2015-03-27 | Disposition: A | Discharge status: Home / Self Care | Payer: No Typology Code available for payment source | Source: Ambulatory Visit | Attending: Cardiology | Admitting: Cardiology

## 2015-03-27 ENCOUNTER — Encounter (HOSPITAL_COMMUNITY): Payer: Self-pay

## 2015-03-27 VITALS — BP 108/72 | HR 70 | Wt 201.0 lb

## 2015-03-27 DIAGNOSIS — D508 Other iron deficiency anemias: Secondary | ICD-10-CM

## 2015-03-27 DIAGNOSIS — I10 Essential (primary) hypertension: Secondary | ICD-10-CM | POA: Diagnosis not present

## 2015-03-27 DIAGNOSIS — D649 Anemia, unspecified: Secondary | ICD-10-CM | POA: Insufficient documentation

## 2015-03-27 DIAGNOSIS — Z79899 Other long term (current) drug therapy: Secondary | ICD-10-CM | POA: Diagnosis not present

## 2015-03-27 DIAGNOSIS — I420 Dilated cardiomyopathy: Secondary | ICD-10-CM | POA: Diagnosis not present

## 2015-03-27 DIAGNOSIS — I5022 Chronic systolic (congestive) heart failure: Secondary | ICD-10-CM | POA: Insufficient documentation

## 2015-03-27 DIAGNOSIS — Z95810 Presence of automatic (implantable) cardiac defibrillator: Secondary | ICD-10-CM | POA: Diagnosis not present

## 2015-03-27 DIAGNOSIS — I471 Supraventricular tachycardia: Secondary | ICD-10-CM | POA: Diagnosis not present

## 2015-03-27 DIAGNOSIS — I429 Cardiomyopathy, unspecified: Secondary | ICD-10-CM

## 2015-03-27 NOTE — Progress Notes (Signed)
Patient ID: Sydney Shaffer, female   DOB: 02/01/1966, 49 y.o.   MRN: [id number] EP: Dr Caryl Comes Medtronic ICD PCP: None   HPI: Sydney Shaffer is a 49 y.o. female with a history of nonischemic cardiomyopathy s/p Medtronic ICD, HTN, chronic systolic heart failure and SVT s/p 01/08/14 ablation.    Admitted 02/03/14 with chest pain through 02/05/14. Troponin negative. She had been out of metoprolol. She was discharged on coreg 12.5 mg twice a day, lasix 40 mg twice a day, spiro 12.5 mg daily, and lisinopril 5 mg daily.  Discharge weight 195 pounds.    She returns for HF follow up. She had been on Xarelto but this has been stopped due to anemia from heavy periods.  Weight at home 198-202. Complaining of fatigue.  Mild dyspnea after walking 20 minutes. Eating lots of ice. Having difficult getting appointment with GYN.   Optivol checked today.  Fluid index below threshold. Activity about 4 hours a day.   ECHO 07/05/12 EF 20-25%  ECHO 02/04/14 EF 20-25%   CPX 06/03/14: FVC 1.69 (51%)  FEV1 1.24 (47%)  FEV1/FVC 74%  Peak VO2: 12.6 (57.8% predicted peak VO2 VE/VCO2 slope: 28.6 OUES: 1.60 Peak RER: 1.01 Peak VO2 adjusted to the patient's ideal body weight of 150 lb (67.8 kg) the peak VO2 is 16.5 ml/kg (ibw)/min (62% of the ibw-adjusted predicted).  Labs 12/04/14 K 3.3 Creatinine 1.13 Labs 11/08/14 K 3.1 Creatinine 0.8 TSH 1.99 Free T4 0.84  Labs 1/16 K 3.5, creatinine 0.92, HCT 26.4 Labs 02/13/2015 K 3.9 Creatinine 0.91   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 She does not drink alcohol . Lives alone. She has 3 grown children   FH: Mom breast cancer . Father cancer   ROS: All systems negative except as listed in HPI, PMH and Problem List.  Past Medical History  Diagnosis Date  . CHF (congestive heart failure)     Dx 03/2012 - dilated cardiomyopathy with EF 20-25% by echo (abnl nuc but normal coronaries 04/02/12 per cath.   . Anemia     felt to be due to heavy menstrual flow   . Chronic systolic heart failure   . Hypertensive heart disease with CHF   . Hypertension   . Dysrhythmia     Bradycardia  . 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 Atrial tachycardia     s/p ablation  . AVNRT (AV nodal re-entry tachycardia)     s/p ablation    Current Outpatient Prescriptions  Medication Sig Dispense Refill  . amiodarone (PACERONE) 200 MG tablet Take 1 tablet (200 mg total) by mouth 2 (two) times daily. 90 tablet 3  . carvedilol (COREG) 3.125 MG tablet Take 1 tablet (3.125 mg total) by mouth 2 (two) times daily with a meal. 60 tablet 5  . Doxylamine Succinate, Sleep, (SLEEP AID PO) Take 1 tablet by mouth at bedtime as needed. For sleep.    . ferrous sulfate 325 (65 FE) MG tablet Take 325 mg by mouth daily with breakfast.    . furosemide (LASIX) 40 MG tablet Take 1.5 tablets (60 mg total) by mouth 2 (two) times daily. 90 tablet 5  . lisinopril (PRINIVIL,ZESTRIL) 10 MG tablet Take 1 tab in AM and 2 tabs in PM 90 tablet 5  . Multiple Vitamin (MULTIVITAMIN WITH MINERALS) TABS Take 1 tablet by mouth daily.    . Multiple Vitamins-Minerals (HAIR/SKIN/NAILS) TABS Take 1 tablet by mouth daily.    Marland Kitchen  pantoprazole (PROTONIX) 40 MG tablet Take 1 tablet (40 mg total) by mouth daily at 12 noon. 30 tablet 1  . potassium chloride SA (K-DUR,KLOR-CON) 20 MEQ tablet Take 2 tablets (40 mEq total) by mouth daily. 60 tablet 3  . spironolactone (ALDACTONE) 25 MG tablet Take 1 tablet (25 mg total) by mouth daily. 90 tablet 3   No current facility-administered medications for this encounter.    Filed Vitals:   03/27/15 0909  BP: 108/72  Pulse: 70  Weight: 201 lb (91.173 kg)  SpO2: 96%   PHYSICAL EXAM: General:  Fatigued appearing. No resp difficulty . Ambulated in the clinic without difficulty.  HEENT: normal Neck: supple. JVP does not appear elevated; Carotids 2+ bilaterally; no bruits. No lymphadenopathy or thryomegaly appreciated. Cor: PMI normal. Regular  rate and rhythm. No rubs, gallops or murmurs. Lungs: clear Abdomen: soft, nontender, nondistended. No hepatosplenomegaly. No bruits or masses. Good bowel sounds. Extremities: no cyanosis, clubbing, rash, no edema Neuro: alert & orientedx3, cranial nerves grossly intact. Moves all 4 extremities w/o difficulty. Affect pleasant.   ASSESSMENT & PLAN:  1. Chronic Systolic Heart Failure: Normal coronaries by cath 2013. Nonischemic cardiomyopathy s/p Medtronic ICD. EF 20-25% (01/2014 echo).  NYHA class II-III symptoms.   Optivol below threshold but trending up which is likely due to ice intake.    - Continue lasix to 60 mg bid and KCl to 40 daily. - Continue Coreg, lisinopril, and spironolactone at current doses.  - Reinforced the need and importance of daily weights, a low sodium diet, and fluid restriction (less than 2 L a day). Instructed to call the HF clinic if weight increases more than 3 lbs overnight or 5 lbs in a week.  2. SVT: Had ablation 01/2014.  Continue amiodarone 200 mg twice a day per Dr Caryl Comes. , will . TSH and ALT AST ok on 02/13/2015. Needs yearly eye exam with amiodarone use.  3. Anemia:  On iron.  Suspect due to heavy periods.  Now off Xarelto.  Hgb 8.6   Having difficulty finding GYN. Today I will refer to GYN.   Follow up in 2 months   CLEGG,AMY 03/27/2015

## 2015-03-27 NOTE — Patient Instructions (Signed)
You have been referred to [facility] for heavy menstrual cycles   We will contact you in 2 months to schedule your next appointment.

## 2015-03-28 ENCOUNTER — Telehealth: Payer: Self-pay | Admitting: Licensed Clinical Social Worker

## 2015-03-28 NOTE — Telephone Encounter (Signed)
CSW referred to assist patient with obtaining a GYN. CSW left message for patient to return call for list of providers. CSW available for any further needs. Raquel Sarna, [place]

## 2015-04-02 ENCOUNTER — Other Ambulatory Visit (HOSPITAL_COMMUNITY): Payer: Self-pay | Admitting: *Deleted

## 2015-04-02 MED ORDER — SPIRONOLACTONE 25 MG PO TABS: 25.0000 mg | ORAL_TABLET | Freq: Every day | ORAL | Status: DC

## 2015-04-27 ENCOUNTER — Encounter: Payer: Self-pay | Admitting: Internal Medicine

## 2015-06-03 ENCOUNTER — Encounter (HOSPITAL_COMMUNITY): Payer: Self-pay | Admitting: *Deleted

## 2015-06-03 ENCOUNTER — Emergency Department (HOSPITAL_COMMUNITY): Payer: No Typology Code available for payment source

## 2015-06-03 ENCOUNTER — Observation Stay (HOSPITAL_COMMUNITY)
Admission: EM | Admit: 2015-06-03 | Discharge: 2015-06-05 | Disposition: A | Discharge status: Home / Self Care | Payer: No Typology Code available for payment source | Attending: Internal Medicine | Admitting: Internal Medicine

## 2015-06-03 DIAGNOSIS — N921 Excessive and frequent menstruation with irregular cycle: Secondary | ICD-10-CM | POA: Diagnosis present

## 2015-06-03 DIAGNOSIS — Z95810 Presence of automatic (implantable) cardiac defibrillator: Secondary | ICD-10-CM | POA: Diagnosis not present

## 2015-06-03 DIAGNOSIS — I110 Hypertensive heart disease with heart failure: Secondary | ICD-10-CM | POA: Diagnosis not present

## 2015-06-03 DIAGNOSIS — R110 Nausea: Secondary | ICD-10-CM | POA: Insufficient documentation

## 2015-06-03 DIAGNOSIS — R231 Pallor: Secondary | ICD-10-CM | POA: Diagnosis not present

## 2015-06-03 DIAGNOSIS — N939 Abnormal uterine and vaginal bleeding, unspecified: Secondary | ICD-10-CM | POA: Insufficient documentation

## 2015-06-03 DIAGNOSIS — R42 Dizziness and giddiness: Secondary | ICD-10-CM | POA: Insufficient documentation

## 2015-06-03 DIAGNOSIS — R0602 Shortness of breath: Secondary | ICD-10-CM | POA: Insufficient documentation

## 2015-06-03 DIAGNOSIS — D649 Anemia, unspecified: Secondary | ICD-10-CM | POA: Diagnosis not present

## 2015-06-03 DIAGNOSIS — R350 Frequency of micturition: Secondary | ICD-10-CM | POA: Diagnosis not present

## 2015-06-03 DIAGNOSIS — I472 Ventricular tachycardia, unspecified: Secondary | ICD-10-CM

## 2015-06-03 DIAGNOSIS — R52 Pain, unspecified: Principal | ICD-10-CM | POA: Insufficient documentation

## 2015-06-03 DIAGNOSIS — I10 Essential (primary) hypertension: Secondary | ICD-10-CM | POA: Diagnosis present

## 2015-06-03 DIAGNOSIS — K5900 Constipation, unspecified: Secondary | ICD-10-CM | POA: Diagnosis not present

## 2015-06-03 DIAGNOSIS — Z79899 Other long term (current) drug therapy: Secondary | ICD-10-CM | POA: Insufficient documentation

## 2015-06-03 DIAGNOSIS — I471 Supraventricular tachycardia, unspecified: Secondary | ICD-10-CM | POA: Diagnosis present

## 2015-06-03 DIAGNOSIS — R630 Anorexia: Secondary | ICD-10-CM | POA: Insufficient documentation

## 2015-06-03 DIAGNOSIS — I5022 Chronic systolic (congestive) heart failure: Secondary | ICD-10-CM | POA: Insufficient documentation

## 2015-06-03 DIAGNOSIS — R109 Unspecified abdominal pain: Secondary | ICD-10-CM | POA: Insufficient documentation

## 2015-06-03 DIAGNOSIS — I420 Dilated cardiomyopathy: Secondary | ICD-10-CM | POA: Diagnosis present

## 2015-06-03 DIAGNOSIS — R079 Chest pain, unspecified: Secondary | ICD-10-CM | POA: Diagnosis present

## 2015-06-03 DIAGNOSIS — N920 Excessive and frequent menstruation with regular cycle: Secondary | ICD-10-CM | POA: Insufficient documentation

## 2015-06-03 DIAGNOSIS — E876 Hypokalemia: Secondary | ICD-10-CM | POA: Insufficient documentation

## 2015-06-03 LAB — BASIC METABOLIC PANEL
Anion gap: 11 (ref 5–15)
BUN: 27 mg/dL — ABNORMAL HIGH (ref 6–20)
CALCIUM: 9.5 mg/dL (ref 8.9–10.3)
CHLORIDE: 98 mmol/L — AB (ref 101–111)
CO2: 25 mmol/L (ref 22–32)
Creatinine, Ser: 1.08 mg/dL — ABNORMAL HIGH (ref 0.44–1.00)
GFR calc non Af Amer: 59 mL/min — ABNORMAL LOW (ref >60)
GLUCOSE: 121 mg/dL — AB (ref 65–99)
POTASSIUM: 3.3 mmol/L — AB (ref 3.5–5.1)
SODIUM: 134 mmol/L — AB (ref 135–145)

## 2015-06-03 LAB — CBC
HCT: 23.1 % — ABNORMAL LOW (ref 36.0–46.0)
Hemoglobin: 7 g/dL — ABNORMAL LOW (ref 12.0–15.0)
MCH: 20.3 pg — ABNORMAL LOW (ref 26.0–34.0)
MCHC: 30.3 g/dL (ref 30.0–36.0)
MCV: 67 fL — ABNORMAL LOW (ref 78.0–100.0)
Platelets: 608 10*3/uL — ABNORMAL HIGH (ref 150–400)
RBC: 3.45 MIL/uL — ABNORMAL LOW (ref 3.87–5.11)
RDW: 18.2 % — ABNORMAL HIGH (ref 11.5–15.5)
WBC: 8.5 10*3/uL (ref 4.0–10.5)

## 2015-06-03 LAB — I-STAT TROPONIN, ED: Troponin i, poc: 0 ng/mL (ref 0.00–0.08)

## 2015-06-03 MED ORDER — PROMETHAZINE HCL 25 MG/ML IJ SOLN
12.5000 mg | Freq: Once | INTRAMUSCULAR | Status: AC
  Administered 2015-06-03: 12.5 mg via INTRAVENOUS
  Filled 2015-06-03: qty 1

## 2015-06-03 MED ORDER — SODIUM CHLORIDE 0.9 % IV SOLN
Freq: Once | INTRAVENOUS | Status: AC
  Administered 2015-06-03: 125 mL/h via INTRAVENOUS

## 2015-06-03 MED ORDER — GI COCKTAIL ~~LOC~~
30.0000 mL | Freq: Once | ORAL | Status: AC
  Administered 2015-06-03: 30 mL via ORAL
  Filled 2015-06-03: qty 30

## 2015-06-03 MED ORDER — POTASSIUM CHLORIDE CRYS ER 20 MEQ PO TBCR
40.0000 meq | EXTENDED_RELEASE_TABLET | Freq: Once | ORAL | Status: AC
Start: 2015-06-03 — End: 2015-06-03
  Administered 2015-06-03: 40 meq via ORAL
  Filled 2015-06-03: qty 2

## 2015-06-03 MED ORDER — ONDANSETRON 4 MG PO TBDP: 4.0000 mg | ORAL_TABLET | Freq: Once | ORAL | Status: DC

## 2015-06-03 MED ORDER — SODIUM CHLORIDE 0.9 % IV BOLUS (SEPSIS)
1000.0000 mL | Freq: Once | INTRAVENOUS | Status: AC
  Administered 2015-06-03: 1000 mL via INTRAVENOUS

## 2015-06-03 NOTE — ED Notes (Signed)
Pt states that she started experiencing CP last night. Pt states that on Friday she began feeling tired and hot. States that she has been on her period for 3 weeks.

## 2015-06-03 NOTE — ED Provider Notes (Signed)
CSN: [id number]     Arrival date & time 06/03/15  2159 History  This chart was scribed for Mairin Lindsley, MD by Randa Evens, ED Scribe. This patient was seen in room D34C/D34C and the patient's care was started at 11:04 PM.      Chief Complaint  Patient presents with  . Chest Pain   Patient is a 49 y.o. female presenting with chest pain. The history is provided by the patient. No language interpreter was used.  Chest Pain Pain location:  Substernal area and epigastric Pain quality: not tearing   Pain radiates to:  Does not radiate Pain radiates to the back: no   Pain severity:  Mild Onset quality:  Gradual Duration:  3 days Timing:  Intermittent Progression:  Unable to specify Chronicity:  New Context: not eating   Relieved by:  None tried Worsened by:  Nothing tried Associated symptoms: abdominal pain, dizziness, nausea and shortness of breath   Associated symptoms: not vomiting   Abdominal pain:    Location:  Epigastric Risk factors: hypertension   Risk factors: no aortic disease and no prior DVT/PE    HPI Comments: Taylon Louison is a 49 y.o. female with PMHx listed belowwho presents to the Emergency Department complaining of intermittent CP onset 3 days prior. Pt states she has associated SOB, nausea, low cramping abdominal pain. Pt also reports urinary frequency, constipation and decreased appetite. Pt states that her last BM was yesterday but was not a full BM and dark in color. Pt states that she is also on an iron supplement. She states that her abdominal pain is worse when having a BM. She states that she feels really dizzy when walking as well. Pt states she has a defibrillator placed. Pt states she has a been on her menstraul cycle for the past 3 week. 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 Denies dysuira, congestion or other related symptoms.     Past Medical History  Diagnosis Date  . CHF (congestive heart failure)     Dx 03/2012 - dilated cardiomyopathy  with EF 20-25% by echo (abnl nuc but normal coronaries 04/02/12 per cath.   . Anemia     felt to be due to heavy menstrual flow  . Chronic systolic heart failure   . Hypertensive heart disease with CHF   . Hypertension   . Dysrhythmia     Bradycardia  . 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 Atrial tachycardia     s/p ablation  . 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 Tubal ligation    . Appendectomy    . Cardiac catheterization  [place] 2013    normal coronaries  . Icd  08/13/2012  . Ep study and ablation  01/07/13    Ablation of AVNRT and atrial tachycardia (arising from the anteroseptal RA 57mm above the HIS)  . Left heart catheterization with coronary angiogram [name] 04/02/2012    Procedure: LEFT HEART CATHETERIZATION WITH CORONARY ANGIOGRAM;  Surgeon: Peter M Martinique, MD;  Location[facility] CATH LAB;  Service: Cardiovascular;  Laterality: [name];  . Implantable cardioverter defibrillator implant [name] 08/13/2012    Procedure: IMPLANTABLE CARDIOVERTER DEFIBRILLATOR IMPLANT;  Surgeon: Deboraha Sprang, MD;  Location: [facility] CATH LAB;  Service: Cardiovascular;  Laterality: [name];  . Supraventricular tachycardia ablation [name] 01/07/2013    Procedure: SUPRAVENTRICULAR TACHYCARDIA ABLATION;  Surgeon: Thompson Grayer, MD;  Location: [facility]  CATH LAB;  Service: Cardiovascular;  Laterality: [name];   Family History  Problem Relation Age of Onset  . Breast cancer Mother   . Cancer Father    History  Substance Use Topics  . Smoking status: Never Smoker   . Smokeless tobacco: Never Used  . Alcohol Use: 1.2 oz/week    2 Glasses of wine per week     Comment: daily   OB History    No data available     Review of Systems  Constitutional: Positive for appetite change.  HENT: Negative for congestion.   Respiratory: Positive for shortness of breath.   Cardiovascular: Positive for chest pain.  Gastrointestinal: Positive for  nausea, abdominal pain and constipation. Negative for vomiting.  Genitourinary: Positive for frequency and vaginal bleeding.  Neurological: Positive for dizziness.  All other systems reviewed and are negative.     Allergies  Review of patient's allergies indicates no known allergies.  Home Medications   Prior to Admission medications   Medication Sig Start Date End Date Taking? Authorizing Provider  amiodarone (PACERONE) 200 MG tablet Take 1 tablet (200 mg total) by mouth 2 (two) times daily. 07/18/14   Rande Brunt, NP  carvedilol (COREG) 3.125 MG tablet Take 1 tablet (3.125 mg total) by mouth 2 (two) times daily with a meal. 12/23/14   Liliane Shi, PA-C  Doxylamine Succinate, Sleep, (SLEEP AID PO) Take 1 tablet by mouth at bedtime as needed. For sleep.    Historical Provider, MD  ferrous sulfate 325 (65 FE) MG tablet Take 325 mg by mouth daily with breakfast.    Historical Provider, MD  furosemide (LASIX) 40 MG tablet Take 1.5 tablets (60 mg total) by mouth 2 (two) times daily. 02/13/15   Larey Dresser, MD  lisinopril (PRINIVIL,ZESTRIL) 10 MG tablet Take 1 tab in AM and 2 tabs in PM 12/23/14   Liliane Shi, PA-C  Multiple Vitamin (MULTIVITAMIN WITH MINERALS) TABS Take 1 tablet by mouth daily.    Historical Provider, MD  Multiple Vitamins-Minerals (HAIR/SKIN/NAILS) TABS Take 1 tablet by mouth daily.    Historical Provider, MD  pantoprazole (PROTONIX) 40 MG tablet Take 1 tablet (40 mg total) by mouth daily at 12 noon. 02/05/14   Barton Dubois, MD  potassium chloride SA (K-DUR,KLOR-CON) 20 MEQ tablet Take 2 tablets (40 mEq total) by mouth daily. 02/13/15   Larey Dresser, MD  spironolactone (ALDACTONE) 25 MG tablet Take 1 tablet (25 mg total) by mouth daily. 04/02/15   Shaune Pascal Bensimhon, MD   BP 146/78 mmHg  Pulse 86  Temp(Src) 97.7 F (36.5 C) (Oral)  Resp 18  Ht 5\' 6"  (1.676 m)  Wt 195 lb (88.451 kg)  BMI 31.49 kg/m2  SpO2 98%  LMP 05/22/2015   Physical Exam  Constitutional:  She is oriented to person, place, and time. She appears well-developed and well-nourished. No distress.  HENT:  Head: Normocephalic and atraumatic.  Mouth/Throat: Oropharynx is clear and moist.  Eyes: EOM are normal. Pupils are equal, round, and reactive to light.  conjunctiva pale.   Neck: Normal range of motion. Neck supple. No tracheal deviation present.  Cardiovascular: Normal rate, regular rhythm and normal heart sounds.   Pulmonary/Chest: Effort normal. No respiratory distress. She has no wheezes. She has no rales.  Abdominal: Soft. She exhibits no mass. Bowel sounds are increased. There is no rebound and no guarding.  Musculoskeletal: Normal range of motion. She exhibits no edema.  Neurological: She is alert and oriented  to person, place, and time. She has normal reflexes.  Skin: Skin is warm and dry. She is not diaphoretic. There is pallor.  Psychiatric: She has a normal mood and affect. Her behavior is normal.  Nursing note and vitals reviewed.   ED Course  Procedures (including critical care time) DIAGNOSTIC STUDIES: Oxygen Saturation is 99% on RA, normal by my interpretation.    COORDINATION OF CARE: 11:47 PM-Discussed treatment plan with pt at bedside and pt agreed to plan.  11:57 PM- Pt will receive bolus and transfusion due to low ejection fraction.    Labs Review Labs Reviewed  CBC - Abnormal; Notable for the following:    RBC 3.45 (*)    Hemoglobin 7.0 (*)    HCT 23.1 (*)    MCV 67.0 (*)    MCH 20.3 (*)    RDW 18.2 (*)    Platelets 608 (*)    All other components within normal limits  BASIC METABOLIC PANEL - Abnormal; Notable for the following:    Sodium 134 (*)    Potassium 3.3 (*)    Chloride 98 (*)    Glucose, Bld 121 (*)    BUN 27 (*)    Creatinine, Ser 1.08 (*)    GFR calc non Af Amer 59 (*)    All other components within normal limits  Randolm Idol, ED    Imaging Review Dg Chest 2 View  06/03/2015   CLINICAL DATA:  Chest pain and dyspnea.   EXAM: CHEST  2 VIEW  COMPARISON:  02/03/2014  FINDINGS: Heart size is upper normal, unchanged. There are intact appearances of the transvenous leads. The lungs are clear. Pulmonary vasculature is normal. There are no pleural effusions.  IMPRESSION: No active cardiopulmonary disease.   Electronically Signed   By: Andreas Newport M.D.   On: 06/03/2015 22:52     EKG Interpretation   Date/Time:  Sunday June 03 2015 22:05:54 EDT Ventricular Rate:  88 PR Interval:  158 QRS Duration: 98 QT Interval:  430 QTC Calculation: 520 R Axis:   21 Text Interpretation:  Normal sinus rhythm Incomplete right bundle branch  block Nonspecific ST and T wave abnormality Prolonged QT Confirmed by  [facility]  MD, Kayne Yuhas ([id number]) on 06/03/2015 11:03:53 PM      MDM   Final diagnoses:  None    Results for orders placed or performed during the hospital encounter of 06/03/15  CBC  Result Value Ref Range   WBC 8.5 4.0 - 10.5 K/uL   RBC 3.45 (L) 3.87 - 5.11 MIL/uL   Hemoglobin 7.0 (L) 12.0 - 15.0 g/dL   HCT 23.1 (L) 36.0 - 46.0 %   MCV 67.0 (L) 78.0 - 100.0 fL   MCH 20.3 (L) 26.0 - 34.0 pg   MCHC 30.3 30.0 - 36.0 g/dL   RDW 18.2 (H) 11.5 - 15.5 %   Platelets 608 (H) 150 - 400 K/uL  Basic metabolic panel  Result Value Ref Range   Sodium 134 (L) 135 - 145 mmol/L   Potassium 3.3 (L) 3.5 - 5.1 mmol/L   Chloride 98 (L) 101 - 111 mmol/L   CO2 25 22 - 32 mmol/L   Glucose, Bld 121 (H) 65 - 99 mg/dL   BUN 27 (H) 6 - 20 mg/dL   Creatinine, Ser 1.08 (H) 0.44 - 1.00 mg/dL   Calcium 9.5 8.9 - 10.3 mg/dL   GFR calc non Af Amer 59 (L) >60 mL/min   GFR calc Af Amer >60 >60  mL/min   Anion gap 11 5 - 15  I-stat troponin, ED  (not at [facility])  Result Value Ref Range   Troponin i, poc 0.00 0.00 - 0.08 ng/mL   Comment 3           Dg Chest 2 View  06/03/2015   CLINICAL DATA:  Chest pain and dyspnea.  EXAM: CHEST  2 VIEW  COMPARISON:  02/03/2014  FINDINGS: Heart size is upper normal, unchanged. There are intact  appearances of the transvenous leads. The lungs are clear. Pulmonary vasculature is normal. There are no pleural effusions.  IMPRESSION: No active cardiopulmonary disease.   Electronically Signed   By: Andreas Newport M.D.   On: 06/03/2015 22:52   Dg Abd Acute W/chest  06/04/2015   CLINICAL DATA:  Lower abdominal pain, onset 2 days ago. Intermittent nausea.  EXAM: DG ABDOMEN ACUTE W/ 1V CHEST  COMPARISON:  None.  FINDINGS: There is no evidence of dilated bowel loops or free intraperitoneal air. No radiopaque calculi or other significant radiographic abnormality is seen. Heart size and mediastinal contours are within normal limits. Both lungs are clear.  IMPRESSION: Negative abdominal radiographs.  No acute cardiopulmonary disease.   Electronically Signed   By: Andreas Newport M.D.   On: 06/04/2015 00:22    Medications  0.9 %  sodium chloride infusion (not administered)  furosemide (LASIX) injection 20 mg (not administered)  potassium chloride SA (K-DUR,KLOR-CON) CR tablet 40 mEq (40 mEq Oral Given 06/03/15 2338)  gi cocktail (Maalox,Lidocaine,Donnatal) (30 mLs Oral Given 06/03/15 2343)  sodium chloride 0.9 % bolus 1,000 mL (1,000 mLs Intravenous New Bag/Given 06/03/15 2337)  promethazine (PHENERGAN) injection 12.5 mg (12.5 mg Intravenous Given 06/03/15 2338)  0.9 %  sodium chloride infusion (125 mL/hr Intravenous New Bag/Given 06/03/15 2338)     I personally performed the services described in this documentation, which was scribed in my presence. The recorded information has been reviewed and is accurate.       Loula Marcella, MD 06/04/15 0100

## 2015-06-04 ENCOUNTER — Encounter (HOSPITAL_COMMUNITY): Payer: Self-pay | Admitting: Emergency Medicine

## 2015-06-04 ENCOUNTER — Inpatient Hospital Stay (HOSPITAL_COMMUNITY): Payer: No Typology Code available for payment source

## 2015-06-04 ENCOUNTER — Emergency Department (HOSPITAL_COMMUNITY): Payer: No Typology Code available for payment source

## 2015-06-04 DIAGNOSIS — I429 Cardiomyopathy, unspecified: Secondary | ICD-10-CM | POA: Diagnosis not present

## 2015-06-04 DIAGNOSIS — R079 Chest pain, unspecified: Secondary | ICD-10-CM | POA: Diagnosis not present

## 2015-06-04 DIAGNOSIS — Z95810 Presence of automatic (implantable) cardiac defibrillator: Secondary | ICD-10-CM | POA: Diagnosis not present

## 2015-06-04 DIAGNOSIS — I420 Dilated cardiomyopathy: Secondary | ICD-10-CM

## 2015-06-04 DIAGNOSIS — N921 Excessive and frequent menstruation with irregular cycle: Secondary | ICD-10-CM

## 2015-06-04 DIAGNOSIS — R109 Unspecified abdominal pain: Secondary | ICD-10-CM | POA: Insufficient documentation

## 2015-06-04 DIAGNOSIS — I255 Ischemic cardiomyopathy: Secondary | ICD-10-CM | POA: Diagnosis not present

## 2015-06-04 DIAGNOSIS — D649 Anemia, unspecified: Secondary | ICD-10-CM

## 2015-06-04 DIAGNOSIS — N939 Abnormal uterine and vaginal bleeding, unspecified: Secondary | ICD-10-CM

## 2015-06-04 DIAGNOSIS — R1084 Generalized abdominal pain: Secondary | ICD-10-CM

## 2015-06-04 DIAGNOSIS — N920 Excessive and frequent menstruation with regular cycle: Secondary | ICD-10-CM

## 2015-06-04 DIAGNOSIS — E876 Hypokalemia: Secondary | ICD-10-CM

## 2015-06-04 HISTORY — DX: Excessive and frequent menstruation with irregular cycle: N92.1

## 2015-06-04 LAB — CBC
HEMATOCRIT: 23.9 % — AB (ref 36.0–46.0)
HEMATOCRIT: 28.7 % — AB (ref 36.0–46.0)
HEMOGLOBIN: 9.1 g/dL — AB (ref 12.0–15.0)
Hemoglobin: 7.3 g/dL — ABNORMAL LOW (ref 12.0–15.0)
MCH: 21.5 pg — ABNORMAL LOW (ref 26.0–34.0)
MCH: 23 pg — AB (ref 26.0–34.0)
MCHC: 30.5 g/dL (ref 30.0–36.0)
MCHC: 31.7 g/dL (ref 30.0–36.0)
MCV: 70.3 fL — AB (ref 78.0–100.0)
MCV: 72.5 fL — AB (ref 78.0–100.0)
PLATELETS: 475 10*3/uL — AB (ref 150–400)
Platelets: 471 10*3/uL — ABNORMAL HIGH (ref 150–400)
RBC: 3.4 MIL/uL — ABNORMAL LOW (ref 3.87–5.11)
RBC: 3.96 MIL/uL (ref 3.87–5.11)
RDW: 20.4 % — AB (ref 11.5–15.5)
RDW: 19.3 % — ABNORMAL HIGH (ref 11.5–15.5)
WBC: 7 10*3/uL (ref 4.0–10.5)
WBC: 8.4 10*3/uL (ref 4.0–10.5)

## 2015-06-04 LAB — TROPONIN I
Troponin I: <0.03 ng/mL (ref <0.031)
Troponin I: <0.03 ng/mL (ref <0.031)
Troponin I: <0.03 ng/mL (ref <0.031)

## 2015-06-04 LAB — ABO/RH: ABO/RH(D): O POS

## 2015-06-04 LAB — PREPARE RBC (CROSSMATCH)

## 2015-06-04 LAB — CREATININE, SERUM
Creatinine, Ser: 0.82 mg/dL (ref 0.44–1.00)
GFR calc Af Amer: >60 mL/min (ref >60)

## 2015-06-04 LAB — MAGNESIUM: Magnesium: 2.2 mg/dL (ref 1.7–2.4)

## 2015-06-04 MED ORDER — ACETAMINOPHEN 325 MG PO TABS
650.0000 mg | ORAL_TABLET | ORAL | Status: DC | PRN
  Administered 2015-06-04: 650 mg via ORAL

## 2015-06-04 MED ORDER — LISINOPRIL 10 MG PO TABS: 10.0000 mg | ORAL_TABLET | Freq: Every morning | ORAL | Status: DC

## 2015-06-04 MED ORDER — MEGESTROL ACETATE 40 MG PO TABS
80.0000 mg | ORAL_TABLET | Freq: Two times a day (BID) | ORAL | Status: DC
  Administered 2015-06-04 – 2015-06-05 (×2): 80 mg via ORAL
  Filled 2015-06-04 (×3): qty 2

## 2015-06-04 MED ORDER — AMIODARONE HCL 200 MG PO TABS
200.0000 mg | ORAL_TABLET | Freq: Two times a day (BID) | ORAL | Status: DC
  Administered 2015-06-04 – 2015-06-05 (×3): 200 mg via ORAL
  Filled 2015-06-04 (×3): qty 1

## 2015-06-04 MED ORDER — LISINOPRIL 40 MG PO TABS: 40.0000 mg | ORAL_TABLET | Freq: Every evening | ORAL | Status: DC

## 2015-06-04 MED ORDER — PANTOPRAZOLE SODIUM 40 MG PO TBEC
40.0000 mg | DELAYED_RELEASE_TABLET | Freq: Every day | ORAL | Status: DC
  Administered 2015-06-04 – 2015-06-05 (×2): 40 mg via ORAL
  Filled 2015-06-04 (×2): qty 1

## 2015-06-04 MED ORDER — ONDANSETRON HCL 4 MG/2ML IJ SOLN: 4.0000 mg | Freq: Four times a day (QID) | INTRAMUSCULAR | Status: DC | PRN

## 2015-06-04 MED ORDER — SODIUM CHLORIDE 0.9 % IV SOLN
Freq: Once | INTRAVENOUS | Status: AC
  Administered 2015-06-04: 03:00:00 via INTRAVENOUS

## 2015-06-04 MED ORDER — DOXYLAMINE SUCCINATE (SLEEP) 25 MG PO TABS
25.0000 mg | ORAL_TABLET | Freq: Every evening | ORAL | Status: DC | PRN
  Filled 2015-06-04: qty 1

## 2015-06-04 MED ORDER — TRAZODONE HCL 50 MG PO TABS
50.0000 mg | ORAL_TABLET | Freq: Every day | ORAL | Status: DC
  Administered 2015-06-04: 50 mg via ORAL
  Filled 2015-06-04: qty 1

## 2015-06-04 MED ORDER — HEPARIN SODIUM (PORCINE) 5000 UNIT/ML IJ SOLN: 5000.0000 [IU] | Freq: Three times a day (TID) | INTRAMUSCULAR | Status: DC

## 2015-06-04 MED ORDER — MORPHINE SULFATE 2 MG/ML IJ SOLN
2.0000 mg | INTRAMUSCULAR | Status: DC | PRN
  Administered 2015-06-04: 2 mg via INTRAVENOUS
  Filled 2015-06-04: qty 1

## 2015-06-04 MED ORDER — IOHEXOL 300 MG/ML  SOLN
100.0000 mL | Freq: Once | INTRAMUSCULAR | Status: AC | PRN
  Administered 2015-06-04: 100 mL via INTRAVENOUS

## 2015-06-04 MED ORDER — FUROSEMIDE 10 MG/ML IJ SOLN
20.0000 mg | Freq: Once | INTRAMUSCULAR | Status: AC
  Administered 2015-06-04: 20 mg via INTRAVENOUS
  Filled 2015-06-04: qty 2

## 2015-06-04 MED ORDER — FERROUS SULFATE 325 (65 FE) MG PO TABS
325.0000 mg | ORAL_TABLET | Freq: Every day | ORAL | Status: DC
  Administered 2015-06-04 – 2015-06-05 (×2): 325 mg via ORAL
  Filled 2015-06-04 (×2): qty 1

## 2015-06-04 MED ORDER — HYDROCODONE-ACETAMINOPHEN 5-325 MG PO TABS
1.0000 | ORAL_TABLET | Freq: Once | ORAL | Status: AC
  Administered 2015-06-04: 1 via ORAL
  Filled 2015-06-04: qty 1

## 2015-06-04 MED ORDER — SODIUM CHLORIDE 0.9 % IV SOLN: Freq: Once | INTRAVENOUS | Status: DC

## 2015-06-04 MED ORDER — FUROSEMIDE 10 MG/ML IJ SOLN
20.0000 mg | Freq: Once | INTRAMUSCULAR | Status: AC
  Administered 2015-06-04: 20 mg via INTRAVENOUS
  Filled 2015-06-04 (×2): qty 2

## 2015-06-04 MED ORDER — ESTROGENS CONJUGATED 25 MG IJ SOLR
25.0000 mg | Freq: Once | INTRAMUSCULAR | Status: AC
  Administered 2015-06-04: 25 mg via INTRAVENOUS
  Filled 2015-06-04 (×2): qty 25

## 2015-06-04 MED ORDER — OXYCODONE HCL 5 MG PO TABS
5.0000 mg | ORAL_TABLET | ORAL | Status: DC | PRN
  Administered 2015-06-04 – 2015-06-05 (×3): 5 mg via ORAL
  Filled 2015-06-04 (×3): qty 1

## 2015-06-04 MED ORDER — CARVEDILOL 3.125 MG PO TABS
3.1250 mg | ORAL_TABLET | Freq: Two times a day (BID) | ORAL | Status: DC
  Administered 2015-06-04 – 2015-06-05 (×3): 3.125 mg via ORAL
  Filled 2015-06-04 (×3): qty 1

## 2015-06-04 MED ORDER — DIPHENHYDRAMINE HCL 25 MG PO CAPS: 25.0000 mg | ORAL_CAPSULE | Freq: Every evening | ORAL | Status: DC | PRN

## 2015-06-04 MED ORDER — SIMETHICONE 80 MG PO CHEW
80.0000 mg | CHEWABLE_TABLET | Freq: Once | ORAL | Status: AC
  Administered 2015-06-04: 80 mg via ORAL
  Filled 2015-06-04: qty 1

## 2015-06-04 NOTE — Progress Notes (Signed)
NP Baltazar Najjar notified in regards to pt pain at level of 5/10 2 hours after 5mg  oxycodone. Requested different form of pain management. Pt repositioned. Will continue to monitor closely and await further orders.

## 2015-06-04 NOTE — H&P (Deleted)
[facility] History and Physical  Sydney Shaffer WEX:[id number] DOB: [birth year] DOA: 06/03/2015  Referring physician:  PCP: Default, Provider, MD  Specialists:   Chief Complaint: Chest pain  HPI: Sydney Shaffer is a 49 y.o. BF PMHx  dilated cardiomyopathy/chronic systolic CHF S/P ICD, atrial tachycardia, AVNRT, HTN,  presents to the Emergency Department complaining of intermittent CP onset 3 days prior. Pt states she has associated SOB, nausea, low cramping abdominal pain. Pt also reports urinary frequency, constipation and decreased appetite. Pt states that her last BM was yesterday but was not a full BM and dark in color. Pt states that she is also on an iron supplement. She states that her abdominal pain is worse when having a BM. She states that she feels really dizzy when walking as well. Pt states she has a defibrillator placed. Pt states she has a been on her menstraul cycle for the past 3 week (using 2 pads/day) 3. 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 Denies dysuira, congestion or other related symptoms.  States starting 2 years ago positive Metrorrhagia/Menorrhagia, has not seen her OB/GYN during this time frame. Prior to this her periods were normal (once per month/not heavy). Positive diffuse abdominal pain>> RLQ/LLQ. States positive malonic stools, negative bright red blood per rectum. Patient states has not had her screening colonoscopy.    Review of Systems: The patient denies anorexia, fever, weight loss,, vision loss, decreased hearing, hoarseness, syncope, peripheral edema, balance deficits, hemoptysis, hematochezia, severe indigestion/heartburn, hematuria, incontinence, genital sores, muscle weakness, suspicious skin lesions, transient blindness, difficulty walking, depression, unusual weight change, enlarged lymph nodes, angioedema, and breast masses.    TRAVEL HISTORY: NA   Consultants:    Procedure/Significant Events:  6/27 KUB pending    Culture      Antibiotics:     DVT prophylaxis:  SCD  Devices     LINES / TUBES:     Past Medical History  Diagnosis Date  . CHF (congestive heart failure)     Dx 03/2012 - dilated cardiomyopathy with EF 20-25% by echo (abnl nuc but normal coronaries 04/02/12 per cath.   . Anemia     felt to be due to heavy menstrual flow  . Chronic systolic heart failure   . Hypertensive heart disease with CHF   . Hypertension   . Dysrhythmia     Bradycardia  . 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 Atrial tachycardia     s/p ablation  . 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 Tubal ligation    . Appendectomy    . Cardiac catheterization  April 2013    normal coronaries  . Icd  08/13/2012  . Ep study and ablation  01/07/13    Ablation of AVNRT and atrial tachycardia (arising from the anteroseptal RA 39mm above the HIS)  . Left heart catheterization with coronary angiogram [name] 04/02/2012    Procedure: LEFT HEART CATHETERIZATION WITH CORONARY ANGIOGRAM;  Surgeon: Peter M Martinique, MD;  Location: [facility] CATH LAB;  Service: Cardiovascular;  Laterality: [name];  . Implantable cardioverter defibrillator implant [name] 08/13/2012    Procedure: IMPLANTABLE CARDIOVERTER DEFIBRILLATOR IMPLANT;  Surgeon: Deboraha Sprang, MD;  Location: [facility] CATH LAB;  Service: Cardiovascular;  Laterality: [name];  . Supraventricular tachycardia ablation [name] 01/07/2013    Procedure: SUPRAVENTRICULAR TACHYCARDIA ABLATION;  Surgeon: Thompson Grayer, MD;  Location: [facility] CATH LAB;  Service: Cardiovascular;  Laterality:  [name];   Social History:  reports that she has never smoked. She has never used smokeless tobacco. She reports that she drinks about 0.6 oz of alcohol per week. She reports that she does not use illicit drugs. where does patient live--home, ALF, SNF? and with whom if at home? Home with husband Can patient participate in ADLs? Yes  No Known  Allergies  Family History  Problem Relation Age of Onset  . Breast cancer Mother   . Cancer Father    spoke with patient/husband and patient/husband stated no additional pertinent family history.  Prior to Admission medications   Medication Sig Start Date End Date Taking? Authorizing Provider  amiodarone (PACERONE) 200 MG tablet Take 1 tablet (200 mg total) by mouth 2 (two) times daily. 07/18/14   Rande Brunt, NP  carvedilol (COREG) 3.125 MG tablet Take 1 tablet (3.125 mg total) by mouth 2 (two) times daily with a meal. 12/23/14   Liliane Shi, PA-C  Doxylamine Succinate, Sleep, (SLEEP AID PO) Take 1 tablet by mouth at bedtime as needed. For sleep.    Historical Provider, MD  ferrous sulfate 325 (65 FE) MG tablet Take 325 mg by mouth daily with breakfast.    Historical Provider, MD  furosemide (LASIX) 40 MG tablet Take 1.5 tablets (60 mg total) by mouth 2 (two) times daily. 02/13/15   Larey Dresser, MD  lisinopril (PRINIVIL,ZESTRIL) 10 MG tablet Take 1 tab in AM and 2 tabs in PM 12/23/14   Liliane Shi, PA-C  Multiple Vitamin (MULTIVITAMIN WITH MINERALS) TABS Take 1 tablet by mouth daily.    Historical Provider, MD  Multiple Vitamins-Minerals (HAIR/SKIN/NAILS) TABS Take 1 tablet by mouth daily.    Historical Provider, MD  pantoprazole (PROTONIX) 40 MG tablet Take 1 tablet (40 mg total) by mouth daily at 12 noon. 02/05/14   Barton Dubois, MD  potassium chloride SA (K-DUR,KLOR-CON) 20 MEQ tablet Take 2 tablets (40 mEq total) by mouth daily. 02/13/15   Larey Dresser, MD  spironolactone (ALDACTONE) 25 MG tablet Take 1 tablet (25 mg total) by mouth daily. 04/02/15   Jolaine Artist, MD   Physical Exam: Danley Danker Vitals:   06/04/15 0104 06/04/15 0125 06/04/15 0219 06/04/15 0245  BP: 128/77 135/75 113/62 118/66  Pulse: 81 81 81 76  Temp: 98 F (36.7 C) 98.4 F (36.9 C) 98.4 F (36.9 C) 98 F (36.7 C)  TempSrc:  Oral  Oral  Resp: 19 16 14 16   Height:  5\' 6"  (1.676 m)    Weight:      SpO2:  99% 100% 100% 99%     General: A/O 4, NAD, No acute respiratory distress Eyes: Negative headache, eye pain, double vision, negative retinal hemorrhage ENT: Negative Runny nose, negative ear pain, negative tinnitus, negative gingival bleeding Neck:  Negative scars, masses, torticollis, lymphadenopathy, JVD Lungs: Clear to auscultation bilaterally without wheezes or crackles Cardiovascular: Regular rate and rhythm without murmur gallop or rub normal S1 and S2 Abdomen: Positive diffuse abdominal pain (greatest RLQ/LLQ), negative dysphagia, nndistended, soft, bowel sounds positive, no rebound, no ascites, no appreciable mass Extremities: No significant cyanosis, clubbing, or edema bilateral lower extremities Psychiatric:  Negative depression, negative anxiety, negative fatigue, negative mania  Neurologic:  Cranial nerves II through XII intact, tongue/uvula midline, all extremities muscle strength 5/5, sensation intact throughout, negative dysarthria, negative expressive aphasia, negative receptive aphasia. Endocrine;   negative palpitations, visual disturbances, negative tremors.  negative tired, positive flat affect , negative thin hair, negative  croaky voice, positive heavy periods, negative constipation negative.  Hemolytic/lymphatic; negative purpura, petechia, Allergic/immunologic; negative Difficulty breathing" or "choking.    Labs on Admission:  Basic Metabolic Panel:  Recent Labs Lab 06/03/15 2217 06/04/15 0306  NA 134*  --   K 3.3*  --   CL 98*  --   CO2 25  --   GLUCOSE 121*  --   BUN 27*  --   CREATININE 1.08*  --   CALCIUM 9.5  --   MG  --  2.2   Liver Function Tests: No results for input(s): AST, ALT, ALKPHOS, BILITOT, PROT, ALBUMIN in the last 168 hours. No results for input(s): LIPASE, AMYLASE in the last 168 hours. No results for input(s): AMMONIA in the last 168 hours. CBC:  Recent Labs Lab 06/03/15 2217  WBC 8.5  HGB 7.0*  HCT 23.1*  MCV 67.0*  PLT 608*    Cardiac Enzymes:  Recent Labs Lab 06/04/15 0100  TROPONINI <0.03    BNP (last 3 results)  Recent Labs  12/22/14 0850 02/13/15 1650  BNP 81.2 78.6    ProBNP (last 3 results)  Recent Labs  11/08/14 1227  PROBNP 185.1*    CBG: No results for input(s): GLUCAP in the last 168 hours.  Radiological Exams on Admission: Dg Chest 2 View  06/03/2015   CLINICAL DATA:  Chest pain and dyspnea.  EXAM: CHEST  2 VIEW  COMPARISON:  02/03/2014  FINDINGS: Heart size is upper normal, unchanged. There are intact appearances of the transvenous leads. The lungs are clear. Pulmonary vasculature is normal. There are no pleural effusions.  IMPRESSION: No active cardiopulmonary disease.   Electronically Signed   By: Andreas Newport M.D.   On: 06/03/2015 22:52   Dg Abd Acute W/chest  06/04/2015   CLINICAL DATA:  Lower abdominal pain, onset 2 days ago. Intermittent nausea.  EXAM: DG ABDOMEN ACUTE W/ 1V CHEST  COMPARISON:  None.  FINDINGS: There is no evidence of dilated bowel loops or free intraperitoneal air. No radiopaque calculi or other significant radiographic abnormality is seen. Heart size and mediastinal contours are within normal limits. Both lungs are clear.  IMPRESSION: Negative abdominal radiographs.  No acute cardiopulmonary disease.   Electronically Signed   By: Andreas Newport M.D.   On: 06/04/2015 00:22    Reviewed EKG: Independently reviewed. NSR, incomplete RBBB, nonspecific ST-T wave changes     Assessment/Plan Active Problems:   Nonischemic dilated cardiomyopathy   Implantable defibrillator-[employer]tronic   SVT (supraventricular tachycardia)   HTN (hypertension)   VT (ventricular tachycardia)   Dilated cardiomyopathy   Symptomatic anemia   Chest pain   Menorrhagia   Metrorrhagia   Abdominal pain   Hypokalemia   Nonischemic dilated cardiomyopathy -Trend troponin -Echocardiogram pending -Strict in and out -Daily standing weight -Hold Lasix -Hold lisinopril -Hold  spironolactone  ICD present left upper chest wall -Has not fired  Atrial tachycardia -Continue amiodarone 200 mg BID -Continue Coreg 3.125 mg BID -  Symptomatic anemia -Transfuse 2 units PRBC -Occult blood pending - Menorrhagia/Metrorrhagia -KUB pending -CT abdomen and pelvis pending -Consult OB/GYN in the a.m. for endometrial biopsy?  GI bleed? -Occult blood pending -Patient will need GI follow-up for screening colonoscopy  Hypokalemia -Potassium goal> 4 -K Dur 40 mEq 1 recheck in a.m. along with magnesium     Code Status: Full  Family Communication: Husband present Disposition Plan: Complete evaluation for chest pain, vaginal bleeding   Care during the described time interval was provided by  me .  I have reviewed this patient's available data, including medical history, events of note, physical examination, and all test results as part of my evaluation. I have personally reviewed and interpreted all radiology studies.    Time spent: 70 minutes  Allie Bossier [facility] Pager [phone]  If 7PM-7AM, please contact night-coverage [email or web address] Password [facility] 06/04/2015, 3:25 AM

## 2015-06-04 NOTE — ED Notes (Signed)
Patient transported to X-ray 

## 2015-06-04 NOTE — H&P (Signed)
[facility] History and Physical  Nishita Isaacks ZTI:[id number] DOB: 21-May-1966 DOA: 06/03/2015  Referring physician:  PCP: Default, Provider, MD  Specialists:   Chief Complaint: Chest pain  HPI: Sydney Shaffer is a 49 y.o. BF PMHx dilated cardiomyopathy/chronic systolic CHF S/P ICD, atrial tachycardia, AVNRT, HTN,  presents to the Emergency Department complaining of intermittent CP onset 3 days prior. Pt states she has associated SOB, nausea, low cramping abdominal pain. Pt also reports urinary frequency, constipation and decreased appetite. Pt states that her last BM was yesterday but was not a full BM and dark in color. Pt states that she is also on an iron supplement. She states that her abdominal pain is worse when having a BM. She states that she feels really dizzy when walking as well. Pt states she has a defibrillator placed. Pt states she has a been on her menstraul cycle for the past 3 week (using 2 pads/day) 3. 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 Denies dysuira, congestion or other related symptoms.  States starting 2 years ago positive Metrorrhagia/Menorrhagia, has not seen her OB/GYN during this time frame. Prior to this her periods were normal (once per month/not heavy). Positive diffuse abdominal pain>> RLQ/LLQ. States positive malonic stools, negative bright red blood per rectum. Patient states has not had her screening colonoscopy.    Review of Systems: The patient denies anorexia, fever, weight loss,, vision loss, decreased hearing, hoarseness, syncope, peripheral edema, balance deficits, hemoptysis, hematochezia, severe indigestion/heartburn, hematuria, incontinence, genital sores, muscle weakness, suspicious skin lesions, transient blindness, difficulty walking, depression, unusual weight change, enlarged lymph nodes, angioedema, and breast masses.   TRAVEL HISTORY: NA   Consultants:    Procedure/Significant Events:  6/27 KUB  pending    Culture     Antibiotics:     DVT prophylaxis:  SCD  Devices     LINES / TUBES:     Past Medical History  Diagnosis Date  . CHF (congestive heart failure)     Dx 03/2012 - dilated cardiomyopathy with EF 20-25% by echo (abnl nuc but normal coronaries 04/02/12 per cath.   . Anemia     felt to be due to heavy menstrual flow  . Chronic systolic heart failure   . Hypertensive heart disease with CHF   . Hypertension   . Dysrhythmia     Bradycardia  . 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 Atrial tachycardia     s/p ablation  . 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 Tubal ligation    . Appendectomy    . Cardiac catheterization  April 2013    normal coronaries  . Icd  08/13/2012  . Ep study and ablation  01/07/13    Ablation of AVNRT and atrial tachycardia (arising from the anteroseptal RA 53mm above the HIS)  . Left heart catheterization with coronary angiogram [name] 04/02/2012    Procedure: LEFT HEART CATHETERIZATION WITH CORONARY ANGIOGRAM;  Surgeon: Peter M Martinique, MD;  Location: [facility] CATH LAB;  Service: Cardiovascular;  Laterality: [name];  . Implantable cardioverter defibrillator implant [name] 08/13/2012    Procedure: IMPLANTABLE CARDIOVERTER DEFIBRILLATOR IMPLANT;  Surgeon: Deboraha Sprang, MD;  Location: [facility] CATH LAB;  Service: Cardiovascular;  Laterality: [name];  . Supraventricular tachycardia ablation [name] 01/07/2013    Procedure: SUPRAVENTRICULAR TACHYCARDIA ABLATION;  Surgeon: Thompson Grayer, MD;  Location: [facility] CATH LAB;  Service: Cardiovascular;  Laterality: [name];  Social History:  reports that she has never smoked. She has never used smokeless tobacco. She reports that she drinks about 0.6 oz of alcohol per week. She reports that she does not use illicit drugs. where does patient live--home, ALF, SNF? and with whom if at home? Home with husband Can patient participate in ADLs?  Yes  No Known Allergies  Family History  Problem Relation Age of Onset  . Breast cancer Mother   . Cancer Father   spoke with patient/husband and patient/husband stated no additional pertinent family history  Prior to Admission medications   Medication Sig Start Date End Date Taking? Authorizing Provider  amiodarone (PACERONE) 200 MG tablet Take 1 tablet (200 mg total) by mouth 2 (two) times daily. 07/18/14  Yes Rande Brunt, NP  carvedilol (COREG) 3.125 MG tablet Take 1 tablet (3.125 mg total) by mouth 2 (two) times daily with a meal. 12/23/14  Yes Liliane Shi, PA-C  Doxylamine Succinate, Sleep, (SLEEP AID PO) Take 1 tablet by mouth at bedtime as needed (sleep).    Yes Historical Provider, MD  ferrous sulfate 325 (65 FE) MG tablet Take 325 mg by mouth daily with breakfast.   Yes Historical Provider, MD  furosemide (LASIX) 40 MG tablet Take 1.5 tablets (60 mg total) by mouth 2 (two) times daily. 02/13/15  Yes Larey Dresser, MD  lisinopril (PRINIVIL,ZESTRIL) 10 MG tablet Take 1 tab in AM and 2 tabs in PM 12/23/14  Yes Liliane Shi, PA-C  Multiple Vitamin (MULTIVITAMIN WITH MINERALS) TABS Take 1 tablet by mouth daily.   Yes Historical Provider, MD  Multiple Vitamins-Minerals (HAIR/SKIN/NAILS) TABS Take 1 tablet by mouth daily.   Yes Historical Provider, MD  pantoprazole (PROTONIX) 40 MG tablet Take 1 tablet (40 mg total) by mouth daily at 12 noon. 02/05/14  Yes Barton Dubois, MD  potassium chloride SA (K-DUR,KLOR-CON) 20 MEQ tablet Take 2 tablets (40 mEq total) by mouth daily. 02/13/15  Yes Larey Dresser, MD  spironolactone (ALDACTONE) 25 MG tablet Take 1 tablet (25 mg total) by mouth daily. 04/02/15  Yes Jolaine Artist, MD   Physical Exam: Filed Vitals:   06/04/15 0104 06/04/15 0125 06/04/15 0219 06/04/15 0245  BP: 128/77 135/75 113/62 118/66  Pulse: 81 81 81 76  Temp: 98 F (36.7 C) 98.4 F (36.9 C) 98.4 F (36.9 C) 98 F (36.7 C)  TempSrc:  Oral  Oral  Resp: 19 16 14 16    Height:  5\' 6"  (1.676 m)    Weight:      SpO2: 99% 100% 100% 99%       General: A/O 4, NAD, No acute respiratory distress Eyes: Negative headache, eye pain, double vision, negative retinal hemorrhage ENT: Negative Runny nose, negative ear pain, negative tinnitus, negative gingival bleeding Neck: Negative scars, masses, torticollis, lymphadenopathy, JVD Lungs: Clear to auscultation bilaterally without wheezes or crackles Cardiovascular: Regular rate and rhythm without murmur gallop or rub normal S1 and S2 Abdomen: Positive diffuse abdominal pain (greatest RLQ/LLQ), negative dysphagia, nndistended, soft, bowel sounds positive, no rebound, no ascites, no appreciable mass Extremities: No significant cyanosis, clubbing, or edema bilateral lower extremities Psychiatric: Negative depression, negative anxiety, negative fatigue, negative mania  Neurologic: Cranial nerves II through XII intact, tongue/uvula midline, all extremities muscle strength 5/5, sensation intact throughout, negative dysarthria, negative expressive aphasia, negative receptive aphasia. Endocrine; negative palpitations, visual disturbances, negative tremors. negative tired, positive flat affect , negative thin hair, negative croaky voice, positive heavy periods, negative constipation negative.  Hemolytic/lymphatic; negative purpura, petechia, Allergic/immunologic; negative Difficulty breathing" or "choking.    Labs on Admission:  Basic Metabolic Panel:  Recent Labs Lab 06/03/15 2217 06/04/15 0306  NA 134*  --   K 3.3*  --   CL 98*  --   CO2 25  --   GLUCOSE 121*  --   BUN 27*  --   CREATININE 1.08*  --   CALCIUM 9.5  --   MG  --  2.2   Liver Function Tests: No results for input(s): AST, ALT, ALKPHOS, BILITOT, PROT, ALBUMIN in the last 168 hours. No results for input(s): LIPASE, AMYLASE in the last 168 hours. No results for input(s): AMMONIA in the last 168 hours. CBC:  Recent Labs Lab  06/03/15 2217  WBC 8.5  HGB 7.0*  HCT 23.1*  MCV 67.0*  PLT 608*   Cardiac Enzymes:  Recent Labs Lab 06/04/15 0100  TROPONINI <0.03    BNP (last 3 results)  Recent Labs  12/22/14 0850 02/13/15 1650  BNP 81.2 78.6    ProBNP (last 3 results)  Recent Labs  11/08/14 1227  PROBNP 185.1*    CBG: No results for input(s): GLUCAP in the last 168 hours.  Radiological Exams on Admission: Dg Chest 2 View  06/03/2015   CLINICAL DATA:  Chest pain and dyspnea.  EXAM: CHEST  2 VIEW  COMPARISON:  02/03/2014  FINDINGS: Heart size is upper normal, unchanged. There are intact appearances of the transvenous leads. The lungs are clear. Pulmonary vasculature is normal. There are no pleural effusions.  IMPRESSION: No active cardiopulmonary disease.   Electronically Signed   By: Andreas Newport M.D.   On: 06/03/2015 22:52   Dg Abd Acute W/chest  06/04/2015   CLINICAL DATA:  Lower abdominal pain, onset 2 days ago. Intermittent nausea.  EXAM: DG ABDOMEN ACUTE W/ 1V CHEST  COMPARISON:  None.  FINDINGS: There is no evidence of dilated bowel loops or free intraperitoneal air. No radiopaque calculi or other significant radiographic abnormality is seen. Heart size and mediastinal contours are within normal limits. Both lungs are clear.  IMPRESSION: Negative abdominal radiographs.  No acute cardiopulmonary disease.   Electronically Signed   By: Andreas Newport M.D.   On: 06/04/2015 00:22    Reviewed EKG: Independently reviewed. NSR, incomplete RBBB, nonspecific ST-T wave changes   Assessment/Plan Active Problems:   Nonischemic dilated cardiomyopathy   Implantable defibrillator-[employer]tronic   SVT (supraventricular tachycardia)   HTN (hypertension)   VT (ventricular tachycardia)   Dilated cardiomyopathy   Symptomatic anemia   Chest pain   Menorrhagia   Metrorrhagia   Abdominal pain   Hypokalemia  Nonischemic dilated cardiomyopathy -Trend troponin -Echocardiogram pending -Strict in and  out -Daily standing weight -Hold Lasix -Hold lisinopril -Hold spironolactone  ICD present left upper chest wall -Has not fired  Atrial tachycardia -Continue amiodarone 200 mg BID -Continue Coreg 3.125 mg BID -  Symptomatic anemia -Transfuse 2 units PRBC -Occult blood pending - Menorrhagia/Metrorrhagia -KUB pending -CT abdomen and pelvis pending -Consult OB/GYN in the a.m. for endometrial biopsy?  GI bleed? -Occult blood pending -Patient will need GI follow-up for screening colonoscopy  Hypokalemia -Potassium goal> 4 -K Dur 40 mEq 1 recheck in a.m. along with magnesium     Code Status: Full  Family Communication: Husband present Disposition Plan: Complete evaluation for chest pain, vaginal bleeding   Care during the described time interval was provided by me .  I have reviewed this patient's available data, including medical  history, events of note, physical examination, and all test results as part of my evaluation. I have personally reviewed and interpreted all radiology studies.    Time spent: 70 minutes  Allie Bossier [facility] Pager [phone]  If 7PM-7AM, please contact night-coverage [email or web address] Password [facility] 06/04/2015, 3:29 AM

## 2015-06-04 NOTE — Consult Note (Addendum)
[facility] OB/GYN Attending Consult Note   Consult Date: 06/04/2015  Reason for Consult: Abnormal uterine bleeding Referring Physician: Dr. Blenda Peals is a 49 y.o. No obstetric history on file. female who was admitted for chest pain.  Assessment/Plan: #1 chest pain  Troponins negative so far  #2 nonischemic dilated cardiomyopathy   Status post echo - LVEF 30-35% with diffuse hypokinesis. This is improved from 02/04/2014. Diastolic dysfunction is also improved as there is no apparent dysfunction in the study  #3 symptomatically anemia  Status post 2 units of packed red blood cells. Patient feels improved.  CBC to be drawn following blood transfusion  #4 abnormal uterine bleeding  Complete pelvic and transvaginal ultrasound to evaluate endometrial lining  IV estrogen 1 dose  Megace 80 mg twice a day starting tonight  We'll arrange follow-up in clinic for continued evaluation as the patient will likely need a endometrial biopsy due to the abnormal uterine bleeding.  Appreciate care of Shemica Meath by her primary team Please call [phone] [facility] Attending on call) for any gynecologic concerns at any time.  Thank you for involving [place] in the care of this patient.   History of present illness: This is a 49 year old G3 P3 003 who presented to the emergency department after 3 days of nonradiating, mild, substernal chest pain with associated shortness of breath, nausea. She was admitted for cardiac workup due to her extensive cardiac history. She has resolved. In addition to her chest pain, the patient complains of abnormal uterine bleeding. Her bleeding changed approximately 2 years ago when her menses lasted for 14 days and occurred every 28-30 days. Prior to 2 years ago, her menses with last 5-7 days, occurring every 28 days. Her last menstrual period started on 06/09 and has been continuing for approximately 3 weeks. Her  menses is heavy and she has needed to change her saturated pads every 45 minutes. She also admits to extensive cramping that is not improved with Aleve. There is no provoking factors. At the time of admission, she was having lightheadedness and dizziness, which is currently improved after receiving the blood products.   Pertinent OB/GYN History: Patient's last menstrual period was 05/17/2015.  She has had 3 term deliveries She has had a tubal ligation, but no other GYN procedures. She has not had a Pap smear for quite some time  Patient Active Problem List   Diagnosis Date Noted  . Dilated cardiomyopathy 06/04/2015  . Symptomatic anemia 06/04/2015  . Chest pain 06/04/2015  . Menorrhagia 06/04/2015  . Metrorrhagia 06/04/2015  . Abdominal pain   . Hypokalemia   . VT (ventricular tachycardia) 07/22/2014  . SVT (supraventricular tachycardia) 03/30/2014  . HTN (hypertension) 03/30/2014  . Chest pressure- unspecified 02/03/2014  . Atrial tachycardia 10/01/2012  . 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  . Hypertensive heart disease with CHF (congestive heart failure) 08/04/2012  . Nonischemic dilated cardiomyopathy 07/29/2012  . Chronic systolic heart failure 40/09/2724  . Dyspnea 03/29/2012  . Anemia 03/29/2012    Past Medical History  Diagnosis Date  . CHF (congestive heart failure)     Dx 03/2012 - dilated cardiomyopathy with EF 20-25% by echo (abnl nuc but normal coronaries 04/02/12 per cath.   . Anemia     felt to be due to heavy menstrual flow  . Chronic systolic heart failure   . Hypertensive heart disease with CHF   . Hypertension   .  Dysrhythmia     Bradycardia  . 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 Atrial tachycardia     s/p ablation  . 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 Tubal ligation    . Appendectomy    . Cardiac catheterization  April 2013    normal  coronaries  . Icd  08/13/2012  . Ep study and ablation  01/07/13    Ablation of AVNRT and atrial tachycardia (arising from the anteroseptal RA 28m above the HIS)  . Left heart catheterization with coronary angiogram [name] 04/02/2012    Procedure: LEFT HEART CATHETERIZATION WITH CORONARY ANGIOGRAM;  Surgeon: Peter M JMartinique MD;  [facility];  Service: Cardiovascular;  Laterality: [name];  . Implantable cardioverter defibrillator implant [name] 08/13/2012    Procedure: IMPLANTABLE CARDIOVERTER DEFIBRILLATOR IMPLANT;  Surgeon: SDeboraha Sprang MD;  [facility];  Service: Cardiovascular;  Laterality: [name];  . Supraventricular tachycardia ablation [name] 01/07/2013    Procedure: SUPRAVENTRICULAR TACHYCARDIA ABLATION;  Surgeon: JThompson Grayer MD;  [facility];  Service: Cardiovascular;  Laterality: [name];    Family History  Problem Relation Age of Onset  . Breast cancer Mother   . Cancer Father     Social History:  reports that she has never smoked. She has never used smokeless tobacco. She reports that she drinks about 0.6 oz of alcohol per week. She reports that she does not use illicit drugs.  Allergies: No Known Allergies  Medications: I have reviewed the patient's current medications.  Review of Systems: Patient denies fevers, chills, nausea, vomiting, diarrhea, constipation, melana, rectal bleeding, shortness of breath, palpitations. All other systems are negative  Focused Physical Examination BP 116/62 mmHg  Pulse 69  Temp(Src) 98.4 F (36.9 C) (Oral)  Resp 18  Ht _0  (1.676 m)  Wt 196 lb 6.4 oz (89.086 kg)  BMI 31.71 kg/m2  SpO2 98%  LMP 05/17/2015 General: Middle-aged black female. Awake and alert and oriented x3. No acute cardiopulmonary distress.  Eyes: Extraocular muscles are intact. Sclerae anicteric and noninjected. Conjunctivae are pale ENT: Oral mucosa moist. No mucosal lesions. Teeth in good repair  Neck: Neck supple without lymphadenopathy. No carotid  bruits. No masses palpated.  Cardiovascular: Regular rate with normal S1-S2 sounds. No murmurs, rubs, gallops auscultated. No JVD.  Respiratory: Good respiratory effort with no wheezes, rales, rhonchi. Lungs clear to auscultation bilaterally.  Abdomen: Soft, nondistended. Moderate tenderness in the lower abdomen and suprapubic region.  No guarding or rebound tenderness. Active bowel sounds. No masses or hepatosplenomegaly  Skin: Dry, warm to touch. 2+ dorsalis pedis and radial pulses. Musculoskeletal: No calf or leg pain. All major joints not erythematous nontender.  Pelvic exam:  Deferred at present time Psychiatric: Intact judgment and insight.  Neurologic: No focal neurological deficits. Cranial nerves II through XII are grossly intact.  Results for orders placed or performed during the hospital encounter of 06/03/15 (from the past 72 hour(s))  CBC     Status: Abnormal   Collection Time: 06/03/15 10:17 PM  Result Value Ref Range   WBC 8.5 4.0 - 10.5 K/uL   RBC 3.45 (L) 3.87 - 5.11 MIL/uL   Hemoglobin 7.0 (L) 12.0 - 15.0 g/dL   HCT 23.1 (L) 36.0 - 46.0 %   MCV 67.0 (L) 78.0 - 100.0 fL   MCH 20.3 (L) 26.0 - 34.0 pg   MCHC 30.3 30.0 -  36.0 g/dL   RDW 18.2 (H) 11.5 - 15.5 %   Platelets 608 (H) 150 - 400 K/uL  Basic metabolic panel     Status: Abnormal   Collection Time: 06/03/15 10:17 PM  Result Value Ref Range   Sodium 134 (L) 135 - 145 mmol/L   Potassium 3.3 (L) 3.5 - 5.1 mmol/L   Chloride 98 (L) 101 - 111 mmol/L   CO2 25 22 - 32 mmol/L   Glucose, Bld 121 (H) 65 - 99 mg/dL   BUN 27 (H) 6 - 20 mg/dL   Creatinine, Ser 1.08 (H) 0.44 - 1.00 mg/dL   Calcium 9.5 8.9 - 10.3 mg/dL   GFR calc non Af Amer 59 (L) >60 mL/min   GFR calc Af Amer >60 >60 mL/min    Comment: (NOTE) The eGFR has been calculated using the CKD EPI equation. This calculation has not been validated in all clinical situations. eGFR's persistently <60 mL/min signify possible Chronic Kidney Disease.    Anion gap 11  5 - 15  I-stat troponin, ED  (not at [facility], [facility])     Status: None   Collection Time: 06/03/15 10:26 PM  Result Value Ref Range   Troponin i, poc 0.00 0.00 - 0.08 ng/mL   Comment 3            Comment: Due to the release kinetics of cTnI, a negative result within the first hours of the onset of symptoms does not rule out myocardial infarction with certainty. If myocardial infarction is still suspected, repeat the test at appropriate intervals.   Prepare RBC     Status: None   Collection Time: 06/04/15 12:45 AM  Result Value Ref Range   Order Confirmation ORDER PROCESSED BY BLOOD BANK   Type and screen     Status: None (Preliminary result)   Collection Time: 06/04/15  1:00 AM  Result Value Ref Range   ABO/RH(D) O POS    Antibody Screen NEG    Sample Expiration 06/07/2015    Unit Number [id number]    Blood Component Type RBC LR PHER2    Unit division 00    Status of Unit ISSUED    Transfusion Status OK TO TRANSFUSE    Crossmatch Result Compatible    Unit Number [id number]    Blood Component Type RED CELLS,LR    Unit division 00    Status of Unit ISSUED    Transfusion Status OK TO TRANSFUSE    Crossmatch Result Compatible   Troponin I (q 6hr x 3)     Status: None   Collection Time: 06/04/15  1:00 AM  Result Value Ref Range   Troponin I <0.03 <0.031 ng/mL    Comment:        NO INDICATION OF MYOCARDIAL INJURY.   ABO/Rh     Status: None   Collection Time: 06/04/15  1:00 AM  Result Value Ref Range   ABO/RH(D) O POS   Magnesium     Status: None   Collection Time: 06/04/15  3:06 AM  Result Value Ref Range   Magnesium 2.2 1.7 - 2.4 mg/dL  Troponin I (q 6hr x 3)     Status: None   Collection Time: 06/04/15  6:00 AM  Result Value Ref Range   Troponin I <0.03 <0.031 ng/mL    Comment:        NO INDICATION OF MYOCARDIAL INJURY.   CBC     Status: Abnormal   Collection Time: 06/04/15  6:00 AM  Result Value Ref Range   WBC 7.0 4.0 - 10.5 K/uL   RBC 3.40 (L) 3.87 - 5.11  MIL/uL   Hemoglobin 7.3 (L) 12.0 - 15.0 g/dL   HCT 23.9 (L) 36.0 - 46.0 %   MCV 70.3 (L) 78.0 - 100.0 fL   MCH 21.5 (L) 26.0 - 34.0 pg   MCHC 30.5 30.0 - 36.0 g/dL   RDW 20.4 (H) 11.5 - 15.5 %   Platelets 475 (H) 150 - 400 K/uL  Creatinine, serum     Status: None   Collection Time: 06/04/15  6:00 AM  Result Value Ref Range   Creatinine, Ser 0.82 0.44 - 1.00 mg/dL   GFR calc non Af Amer >60 >60 mL/min   GFR calc Af Amer >60 >60 mL/min    Comment: (NOTE) The eGFR has been calculated using the CKD EPI equation. This calculation has not been validated in all clinical situations. eGFR's persistently <60 mL/min signify possible Chronic Kidney Disease.      Dg Chest 2 View  06/03/2015   CLINICAL DATA:  Chest pain and dyspnea.  EXAM: CHEST  2 VIEW  COMPARISON:  02/03/2014  FINDINGS: Heart size is upper normal, unchanged. There are intact appearances of the transvenous leads. The lungs are clear. Pulmonary vasculature is normal. There are no pleural effusions.  IMPRESSION: No active cardiopulmonary disease.   Electronically Signed   By: Andreas Newport M.D.   On: 06/03/2015 22:52   Ct Abdomen Pelvis W Contrast  06/04/2015   CLINICAL DATA:  BILATERAL lower abdominal pain, menorrhagia, excessive vaginal bleeding requiring blood transfusion, prior history of CHF, hypertension, non ischemic dilated cardiomyopathy  EXAM: CT ABDOMEN AND PELVIS WITH CONTRAST  TECHNIQUE: Multidetector CT imaging of the abdomen and pelvis was performed using the standard protocol following bolus administration of intravenous contrast. Sagittal and coronal MPR images reconstructed from axial data set.  CONTRAST:  164m OMNIPAQUE IOHEXOL 300 MG/ML SOLN IV. No oral contrast administered.  COMPARISON:  None  FINDINGS: Lung bases clear.  AICD leads RIGHT atrium and RIGHT ventricle.  Tiny RIGHT renal cyst.  Liver, gallbladder, spleen, pancreas, kidneys, and adrenal glands normal.  Appendix surgically absent by history.  Uterus  prominent in size at 10.8 x 6.5 x 7.8 cm with heterogeneous enhancement.  No discrete uterine mass identified.  Unremarkable ovaries, bladder, and ureters.  Stomach and bowel loops grossly normal for exam lacking GI contrast.  Minimal nonspecific free pelvic fluid.  No mass, adenopathy, free air, significant ascites, or hernia.  Osseous structures unremarkable.  IMPRESSION: Enlarged uterus without discrete mass by CT ; if there is clinical concern for uterine mass/leiomyomata consider follow-up pelvic and transvaginal sonography for further assessment.  Otherwise negative exam.   Electronically Signed   By: MLavonia DanaM.D.   On: 06/04/2015 07:44   Dg Abd Acute W/chest  06/04/2015   CLINICAL DATA:  Lower abdominal pain, onset 2 days ago. Intermittent nausea.  EXAM: DG ABDOMEN ACUTE W/ 1V CHEST  COMPARISON:  None.  FINDINGS: There is no evidence of dilated bowel loops or free intraperitoneal air. No radiopaque calculi or other significant radiographic abnormality is seen. Heart size and mediastinal contours are within normal limits. Both lungs are clear.  IMPRESSION: Negative abdominal radiographs.  No acute cardiopulmonary disease.   Electronically Signed   By: DAndreas NewportM.D.   On: 06/04/2015 00:22     JTruett Mainland DO 06/04/2015, 3:46 PM Attending Physician [facility] Practice, [facility]

## 2015-06-04 NOTE — Progress Notes (Signed)
[facility] PROGRESS NOTE  Sydney Shaffer JXB:[id number] DOB: 28-Jul-1966 DOA: 06/03/2015 PCP: Default, Provider, MD  Assessment/Plan: 1. Symptomatic anemia. -Likely secondary to abnormal uterine bleeding. She reports having vaginal bleeding for the past 3 weeks stating that menstrual cycles typically lasts about 2 weeks. -She was typed and crossed and has been transfused 2 units of packed red blood cells, have ordered a third unit given her complaints of chest discomfort. -We'll follow-up on CBC in a.m. -GYN consulted  2.  Abnormal uterine bleeding. -GYN consulted, recommended complete pelvic and transvaginal ultrasound for evaluating endometrial lining. Dr. Nehemiah Settle recommended as June IV and Megace 80 mg twice a day -Patient will need outpatient follow-up in the GYN clinic  3.  Chest pain. -Likely secondary to acute ball last anemia in setting of abnormal uterine bleeding -She is being transfused with packed red blood cells.  4. Dilated cardiomyopathy. -Transthoracic echocardiogram performed on 06/04/2015 showing ejection fraction of 30-35% with diffuse hypokinesis. -She was administered IV Lasix in between blood transfusions, will continue monitoring volume status carefully  Code Status: Full code Family Communication: I spoke with her fianc was present at bedside Disposition Plan: Anticipate discharge home when medically stable   Consultants:  GYN  Procedures:  Status post blood transfusion with a total of 3 units packed red blood cells   HPI/Subjective: Patient is a pleasant 49 year old with a past medical history of dilated carbohydrate myopathy, admitted to the medicine service on 6 02/12/2015 when she presented to the emergency department with complaints of intermittent chest pain associated with shortness of breath. She also complained of generalized weakness, fatigue, poor tolerance to physical exertion. Labs revealed a hemoglobin of 7.0. Symptoms were attributed  to symptomatic anemia likely resulting from menorrhagia. She reported having menses that usually lasted 2 weeks. This month she stated her menstrual cycle had been 3 weeks and she will continued to have bleeding and using greater than 10 pads per day. She denied having GYN provider.    Objective: Filed Vitals:   06/04/15 1619  BP: 94/65  Pulse: 73  Temp: 98 F (36.7 C)  Resp:     Intake/Output Summary (Last 24 hours) at 06/04/15 1737 Last data filed at 06/04/15 1115  Gross per 24 hour  Intake    785 ml  Output    250 ml  Net    535 ml   Filed Weights   06/03/15 2207 06/04/15 0053  Weight: 88.451 kg (195 lb) 89.086 kg (196 lb 6.4 oz)    Exam:   General:  Patient's ill appearing, reports ongoing shortness of breath though denies having active chest pain  Cardiovascular: 26 systolic ejection murmur no rubs or gallops  Respiratory: Lungs overall clear to auscultation bilaterally no wheezing rhonchi or rales  Abdomen: She has pain to palpation over suprapubic lower abdominal region  Musculoskeletal: No edema   Data Reviewed: Basic Metabolic Panel:  Recent Labs Lab 06/03/15 2217 06/04/15 0306 06/04/15 0600  NA 134*  --   --   K 3.3*  --   --   CL 98*  --   --   CO2 25  --   --   GLUCOSE 121*  --   --   BUN 27*  --   --   CREATININE 1.08*  --  0.82  CALCIUM 9.5  --   --   MG  --  2.2  --    Liver Function Tests: No results for input(s): AST, ALT, ALKPHOS, BILITOT, PROT, ALBUMIN  in the last 168 hours. No results for input(s): LIPASE, AMYLASE in the last 168 hours. No results for input(s): AMMONIA in the last 168 hours. CBC:  Recent Labs Lab 06/03/15 2217 06/04/15 0600 06/04/15 1554  WBC 8.5 7.0 8.4  HGB 7.0* 7.3* 9.1*  HCT 23.1* 23.9* 28.7*  MCV 67.0* 70.3* 72.5*  PLT 608* 475* 471*   Cardiac Enzymes:  Recent Labs Lab 06/04/15 0100 06/04/15 0600 06/04/15 1554  TROPONINI <0.03 <0.03 <0.03   BNP (last 3 results)  Recent Labs  12/22/14 0850  02/13/15 1650  BNP 81.2 78.6    ProBNP (last 3 results)  Recent Labs  11/08/14 1227  PROBNP 185.1*    CBG: No results for input(s): GLUCAP in the last 168 hours.  No results found for this or any previous visit (from the past 240 hour(s)).   Studies: Dg Chest 2 View  06/03/2015   CLINICAL DATA:  Chest pain and dyspnea.  EXAM: CHEST  2 VIEW  COMPARISON:  02/03/2014  FINDINGS: Heart size is upper normal, unchanged. There are intact appearances of the transvenous leads. The lungs are clear. Pulmonary vasculature is normal. There are no pleural effusions.  IMPRESSION: No active cardiopulmonary disease.   Electronically Signed   By: Andreas Newport M.D.   On: 06/03/2015 22:52   Ct Abdomen Pelvis W Contrast  06/04/2015   CLINICAL DATA:  BILATERAL lower abdominal pain, menorrhagia, excessive vaginal bleeding requiring blood transfusion, prior history of CHF, hypertension, non ischemic dilated cardiomyopathy  EXAM: CT ABDOMEN AND PELVIS WITH CONTRAST  TECHNIQUE: Multidetector CT imaging of the abdomen and pelvis was performed using the standard protocol following bolus administration of intravenous contrast. Sagittal and coronal MPR images reconstructed from axial data set.  CONTRAST:  173mL OMNIPAQUE IOHEXOL 300 MG/ML SOLN IV. No oral contrast administered.  COMPARISON:  None  FINDINGS: Lung bases clear.  AICD leads RIGHT atrium and RIGHT ventricle.  Tiny RIGHT renal cyst.  Liver, gallbladder, spleen, pancreas, kidneys, and adrenal glands normal.  Appendix surgically absent by history.  Uterus prominent in size at 10.8 x 6.5 x 7.8 cm with heterogeneous enhancement.  No discrete uterine mass identified.  Unremarkable ovaries, bladder, and ureters.  Stomach and bowel loops grossly normal for exam lacking GI contrast.  Minimal nonspecific free pelvic fluid.  No mass, adenopathy, free air, significant ascites, or hernia.  Osseous structures unremarkable.  IMPRESSION: Enlarged uterus without discrete  mass by CT ; if there is clinical concern for uterine mass/leiomyomata consider follow-up pelvic and transvaginal sonography for further assessment.  Otherwise negative exam.   Electronically Signed   By: Lavonia Dana M.D.   On: 06/04/2015 07:44   Dg Abd Acute W/chest  06/04/2015   CLINICAL DATA:  Lower abdominal pain, onset 2 days ago. Intermittent nausea.  EXAM: DG ABDOMEN ACUTE W/ 1V CHEST  COMPARISON:  None.  FINDINGS: There is no evidence of dilated bowel loops or free intraperitoneal air. No radiopaque calculi or other significant radiographic abnormality is seen. Heart size and mediastinal contours are within normal limits. Both lungs are clear.  IMPRESSION: Negative abdominal radiographs.  No acute cardiopulmonary disease.   Electronically Signed   By: Andreas Newport M.D.   On: 06/04/2015 00:22    Scheduled Meds: . sodium chloride   Intravenous Once  . amiodarone  200 mg Oral BID  . carvedilol  3.125 mg Oral BID WC  . conjugated estrogens  25 mg Intravenous Once  . ferrous sulfate  325 mg Oral  Q breakfast  . furosemide  20 mg Intravenous Once  . megestrol  80 mg Oral BID  . pantoprazole  40 mg Oral Q1200   Continuous Infusions:   Active Problems:   Nonischemic dilated cardiomyopathy   Implantable defibrillator-[employer]tr[employer]   SVT (supraventricular tachycardia)   HTN (hypertension)   VT (ventricular tachycardia)   Dilated cardiomyopathy   Symptomatic anemia   Chest pain   Abnormal uterine bleeding (AUB)   Metrorrhagia   Abdominal pain   Hypokalemia    Time spent: 35 minutes    Kelvin Cellar  [facility] Pager [phone]. If 7PM-7AM, please contact night-coverage at [email or web address], password [facility] 06/04/2015, 5:37 PM  LOS: 0 days

## 2015-06-04 NOTE — Progress Notes (Signed)
RN called radiology to notify that patient was finished with the unit of blood and was able to come for testing.

## 2015-06-04 NOTE — Progress Notes (Signed)
Echocardiogram 2D Echocardiogram has been performed.  Tresa Res 06/04/2015, 12:19 PM

## 2015-06-05 ENCOUNTER — Observation Stay (HOSPITAL_COMMUNITY): Payer: No Typology Code available for payment source

## 2015-06-05 DIAGNOSIS — D649 Anemia, unspecified: Secondary | ICD-10-CM | POA: Diagnosis not present

## 2015-06-05 DIAGNOSIS — I420 Dilated cardiomyopathy: Secondary | ICD-10-CM | POA: Diagnosis not present

## 2015-06-05 DIAGNOSIS — N939 Abnormal uterine and vaginal bleeding, unspecified: Secondary | ICD-10-CM | POA: Diagnosis not present

## 2015-06-05 DIAGNOSIS — R079 Chest pain, unspecified: Secondary | ICD-10-CM | POA: Diagnosis not present

## 2015-06-05 LAB — BASIC METABOLIC PANEL
Anion gap: 4 — ABNORMAL LOW (ref 5–15)
BUN: 18 mg/dL (ref 6–20)
CALCIUM: 8.6 mg/dL — AB (ref 8.9–10.3)
CO2: 25 mmol/L (ref 22–32)
Chloride: 102 mmol/L (ref 101–111)
Creatinine, Ser: 0.79 mg/dL (ref 0.44–1.00)
GFR calc Af Amer: >60 mL/min (ref >60)
GLUCOSE: 106 mg/dL — AB (ref 65–99)
Potassium: 3.5 mmol/L (ref 3.5–5.1)
SODIUM: 131 mmol/L — AB (ref 135–145)

## 2015-06-05 LAB — TYPE AND SCREEN
ABO/RH(D): O POS
ANTIBODY SCREEN: NEGATIVE
UNIT DIVISION: 0
Unit division: 0
Unit division: 0

## 2015-06-05 LAB — CBC
HEMATOCRIT: 30.3 % — AB (ref 36.0–46.0)
HEMOGLOBIN: 9.8 g/dL — AB (ref 12.0–15.0)
MCH: 23.6 pg — AB (ref 26.0–34.0)
MCHC: 32.3 g/dL (ref 30.0–36.0)
MCV: 73 fL — AB (ref 78.0–100.0)
Platelets: 432 10*3/uL — ABNORMAL HIGH (ref 150–400)
RBC: 4.15 MIL/uL (ref 3.87–5.11)
RDW: 19.6 % — ABNORMAL HIGH (ref 11.5–15.5)
WBC: 8.1 10*3/uL (ref 4.0–10.5)

## 2015-06-05 MED ORDER — OXYCODONE HCL 5 MG PO TABS: 5.0000 mg | ORAL_TABLET | Freq: Four times a day (QID) | ORAL | Status: DC | PRN

## 2015-06-05 MED ORDER — MEGESTROL ACETATE 40 MG PO TABS: 80.0000 mg | ORAL_TABLET | Freq: Two times a day (BID) | ORAL | Status: DC

## 2015-06-05 MED ORDER — FUROSEMIDE 10 MG/ML IJ SOLN
20.0000 mg | Freq: Once | INTRAMUSCULAR | Status: AC
  Administered 2015-06-05: 20 mg via INTRAVENOUS
  Filled 2015-06-05: qty 2

## 2015-06-05 MED ORDER — SPIRONOLACTONE 25 MG PO TABS
25.0000 mg | ORAL_TABLET | Freq: Every day | ORAL | Status: DC
  Administered 2015-06-05: 25 mg via ORAL
  Filled 2015-06-05: qty 1

## 2015-06-05 NOTE — Progress Notes (Signed)
Discharge instructions and teaching reviewed with pt. VSS. Patient will be discharging home with fiance.

## 2015-06-05 NOTE — Discharge Summary (Signed)
Physician Discharge Summary  Sydney Shaffer VOH:[id number] DOB: 1966-08-26 DOA: 06/03/2015  PCP: Default, Provider, MD  Admit date: 06/03/2015 Discharge date: 06/05/2015  Time spent: 35 minutes  Recommendations for Outpatient Follow-up:  1. Please follow up on CBC on hospital follow up visit, she had acute blood loss anemia from menorrhagia, was transfused with PRBC's 2. Please follow up on volume status, during this hospitalization she was transfused a total of 3 units of PRBC's. She was given IV lasix. On day of discharge she did not appear to be volume overloaded, satting 98% on RA.    Discharge Diagnoses:  Active Problems:   Nonischemic dilated cardiomyopathy   Implantable defibrillator-[employer]   SVT (supraventricular tachycardia)   HTN (hypertension)   VT (ventricular tachycardia)   Dilated cardiomyopathy   Symptomatic anemia   Chest pain   Abnormal uterine bleeding (AUB)   Metrorrhagia   Abdominal pain   Hypokalemia   Menorrhagia   Discharge Condition: Stable/Improved  Diet recommendation: Heart Healthy  Filed Weights   06/03/15 2207 06/04/15 0053 06/05/15 0400  Weight: 88.451 kg (195 lb) 89.086 kg (196 lb 6.4 oz) 87.726 kg (193 lb 6.4 oz)    History of present illness:  Sydney Shaffer is a 49 y.o. BF PMHx dilated cardiomyopathy/chronic systolic CHF S/P ICD, atrial tachycardia, AVNRT, HTN, presents to the Emergency Department complaining of intermittent CP onset 3 days prior. Pt states she has associated SOB, nausea, low cramping abdominal pain. Pt also reports urinary frequency, constipation and decreased appetite. Pt states that her last BM was yesterday but was not a full BM and dark in color. Pt states that she is also on an iron supplement. She states that her abdominal pain is worse when having a BM. She states that she feels really dizzy when walking as well. Pt states she has a defibrillator placed. Pt states she has a been on her menstraul cycle for the past 3  week (using 2 pads/day) 3. 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 Denies dysuira, congestion or other related symptoms.  States starting 2 years ago positive Metrorrhagia/Menorrhagia, has not seen her OB/GYN during this time frame. Prior to this her periods were normal (once per month/not heavy). Positive diffuse abdominal pain>> RLQ/LLQ. States positive malonic stools, negative bright red blood per rectum. Patient states has not had her screening colonoscopy.  Hospital Course:  Patient is a pleasant 49 year old with a past medical history of dilated carbohydrate myopathy, admitted to the medicine service on 6 02/12/2015 when she presented to the emergency department with complaints of intermittent chest pain associated with shortness of breath. She also complained of generalized weakness, fatigue, poor tolerance to physical exertion. Labs revealed a hemoglobin of 7.0. Symptoms were attributed to symptomatic anemia likely resulting from menorrhagia. She reported having menses that usually lasted 2 weeks. This month she stated her menstrual cycle had been 3 weeks and she will continued to have bleeding and using greater than 10 pads per day. She denied having GYN provider. She was transfused with a total of 3 units of PRBC's during this hospitalization since she continued to have abnormal uterine bleeding. She was also given IV lasix. On the day of discharge her labs revealed Hg of 9.8 with Hct of 30.3. With regard to abnormal uterine bleed, GYN was consulted as she was started on Megace and given 1 dose of IV estrogen. Pelvic U/S showed diffusely heterogenous echotexture throughout uterus. Given resolution to chest pain and clinical stability she was discharged  to her home on 06/05/2015.    Consultations:  GYN  Discharge Exam: Filed Vitals:   06/05/15 1115  BP: 102/54  Pulse: 69  Temp: 98 F (36.7 C)  Resp: 19     General: Patient's ill appearing, reports ongoing shortness of  breath though denies having active chest pain  Cardiovascular: 26 systolic ejection murmur no rubs or gallops  Respiratory: Lungs overall clear to auscultation bilaterally no wheezing rhonchi or rales  Abdomen: She has pain to palpation over suprapubic lower abdominal region  Musculoskeletal: No edema   Discharge Instructions   Discharge Instructions    Call MD for:  difficulty breathing, headache or visual disturbances    Complete by:  As directed      Call MD for:  extreme fatigue    Complete by:  As directed      Call MD for:  hives    Complete by:  As directed      Call MD for:  persistant dizziness or light-headedness    Complete by:  As directed      Call MD for:  persistant nausea and vomiting    Complete by:  As directed      Call MD for:  redness, tenderness, or signs of infection (pain, swelling, redness, odor or green/yellow discharge around incision site)    Complete by:  As directed      Call MD for:  severe uncontrolled pain    Complete by:  As directed      Call MD for:  temperature >100.4    Complete by:  As directed      Call MD for:    Complete by:  As directed      Diet - low sodium heart healthy    Complete by:  As directed      Increase activity slowly    Complete by:  As directed           Current Discharge Medication List    START taking these medications   Details  megestrol (MEGACE) 40 MG tablet Take 2 tablets (80 mg total) by mouth 2 (two) times daily. Qty: 60 tablet, Refills: 0    oxyCODONE (OXY IR/ROXICODONE) 5 MG immediate release tablet Take 1 tablet (5 mg total) by mouth every 6 (six) hours as needed for severe pain. Qty: 5 tablet, Refills: 0      CONTINUE these medications which have NOT CHANGED   Details  amiodarone (PACERONE) 200 MG tablet Take 1 tablet (200 mg total) by mouth 2 (two) times daily. Qty: 90 tablet, Refills: 3    carvedilol (COREG) 3.125 MG tablet Take 1 tablet (3.125 mg total) by mouth 2 (two) times daily with a  meal. Qty: 60 tablet, Refills: 5    Doxylamine Succinate, Sleep, (SLEEP AID PO) Take 1 tablet by mouth at bedtime as needed (sleep).     ferrous sulfate 325 (65 FE) MG tablet Take 325 mg by mouth daily with breakfast.    furosemide (LASIX) 40 MG tablet Take 1.5 tablets (60 mg total) by mouth 2 (two) times daily. Qty: 90 tablet, Refills: 5   Associated Diagnoses: Chronic systolic heart failure    lisinopril (PRINIVIL,ZESTRIL) 10 MG tablet Take 1 tab in AM and 2 tabs in PM Qty: 90 tablet, Refills: 5    !! Multiple Vitamin (MULTIVITAMIN WITH MINERALS) TABS Take 1 tablet by mouth daily.    !! Multiple Vitamins-Minerals (HAIR/SKIN/NAILS) TABS Take 1 tablet by mouth daily.    pantoprazole (  PROTONIX) 40 MG tablet Take 1 tablet (40 mg total) by mouth daily at 12 noon. Qty: 30 tablet, Refills: 1    potassium chloride SA (K-DUR,KLOR-CON) 20 MEQ tablet Take 2 tablets (40 mEq total) by mouth daily. Qty: 60 tablet, Refills: 3    spironolactone (ALDACTONE) 25 MG tablet Take 1 tablet (25 mg total) by mouth daily. Qty: 90 tablet, Refills: 3     !! - Potential duplicate medications found. Please discuss with provider.     No Known Allergies Follow-up Information    Follow up with [facility]. Schedule an appointment as soon as possible for a visit in 2 weeks.   Specialty:  Obstetrics and Gynecology   Contact information:   [place] [phone]      Follow up with Virl Axe, MD In 2 weeks.   Specialty:  Cardiology   Contact information:   [address] [place] [address] [phone]        The results of significant diagnostics from this hospitalization (including imaging, microbiology, ancillary and laboratory) are listed below for reference.    Significant Diagnostic Studies: Dg Chest 2 View  06/03/2015   CLINICAL DATA:  Chest pain and dyspnea.  EXAM: CHEST  2 VIEW  COMPARISON:  02/03/2014  FINDINGS:  Heart size is upper normal, unchanged. There are intact appearances of the transvenous leads. The lungs are clear. Pulmonary vasculature is normal. There are no pleural effusions.  IMPRESSION: No active cardiopulmonary disease.   Electronically Signed   By: Andreas Newport M.D.   On: 06/03/2015 22:52   US Transvaginal Non-ob  06/05/2015   CLINICAL DATA:  Abnormal uterine bleeding  EXAM: TRANSABDOMINAL AND TRANSVAGINAL ULTRASOUND OF PELVIS  TECHNIQUE: Both transabdominal and transvaginal ultrasound examinations of the pelvis were performed. Transabdominal technique was performed for global imaging of the pelvis including uterus, ovaries, adnexal regions, and pelvic cul-de-sac. It was necessary to proceed with endovaginal exam following the transabdominal exam to visualize the uterus, endometrium, ovaries and adnexa .  COMPARISON:  CT 06/04/2015  FINDINGS: Uterus  Measurements: 10.6 x 6.5 x 7.5 cm. Heterogeneous echotexture throughout the uterus. No focal well-defined fibroid.  Endometrium  Thickness: Unable to visualize due to heterogeneous echotexture of the myometrium.  Right ovary  Measurements: Complex cystic area within the right adnexa, likely within the right ovary measuring 4.9 x 2.6 x 3.9 cm. Internal septations noted. No internal blood flow.  Left ovary  Measurements: Not visualized.  No adnexal masses seen.  Other findings  Trace free fluid in the pelvis  IMPRESSION: Diffusely heterogeneous echotexture throughout the uterus. This most likely reflects adenomyosis. Unable to visualize the endometrium.  Complex cystic structure within the right ovary measuring up to 4.9 cm. Recommend followup ultrasound in 8-12 weeks.   Electronically Signed   By: Rolm Baptise M.D.   On: 06/05/2015 08:46   US Pelvis Complete  06/05/2015   CLINICAL DATA:  Abnormal uterine bleeding  EXAM: TRANSABDOMINAL AND TRANSVAGINAL ULTRASOUND OF PELVIS  TECHNIQUE: Both transabdominal and transvaginal ultrasound examinations of the  pelvis were performed. Transabdominal technique was performed for global imaging of the pelvis including uterus, ovaries, adnexal regions, and pelvic cul-de-sac. It was necessary to proceed with endovaginal exam following the transabdominal exam to visualize the uterus, endometrium, ovaries and adnexa .  COMPARISON:  CT 06/04/2015  FINDINGS: Uterus  Measurements: 10.6 x 6.5 x 7.5 cm. Heterogeneous echotexture throughout the uterus. No focal well-defined fibroid.  Endometrium  Thickness: Unable to visualize due to heterogeneous echotexture of the myometrium.  Right ovary  Measurements: Complex cystic area within the right adnexa, likely within the right ovary measuring 4.9 x 2.6 x 3.9 cm. Internal septations noted. No internal blood flow.  Left ovary  Measurements: Not visualized.  No adnexal masses seen.  Other findings  Trace free fluid in the pelvis  IMPRESSION: Diffusely heterogeneous echotexture throughout the uterus. This most likely reflects adenomyosis. Unable to visualize the endometrium.  Complex cystic structure within the right ovary measuring up to 4.9 cm. Recommend followup ultrasound in 8-12 weeks.   Electronically Signed   By: Rolm Baptise M.D.   On: 06/05/2015 08:46   Ct Abdomen Pelvis W Contrast  06/04/2015   CLINICAL DATA:  BILATERAL lower abdominal pain, menorrhagia, excessive vaginal bleeding requiring blood transfusion, prior history of CHF, hypertension, non ischemic dilated cardiomyopathy  EXAM: CT ABDOMEN AND PELVIS WITH CONTRAST  TECHNIQUE: Multidetector CT imaging of the abdomen and pelvis was performed using the standard protocol following bolus administration of intravenous contrast. Sagittal and coronal MPR images reconstructed from axial data set.  CONTRAST:  138mL OMNIPAQUE IOHEXOL 300 MG/ML SOLN IV. No oral contrast administered.  COMPARISON:  None  FINDINGS: Lung bases clear.  AICD leads RIGHT atrium and RIGHT ventricle.  Tiny RIGHT renal cyst.  Liver, gallbladder, spleen,  pancreas, kidneys, and adrenal glands normal.  Appendix surgically absent by history.  Uterus prominent in size at 10.8 x 6.5 x 7.8 cm with heterogeneous enhancement.  No discrete uterine mass identified.  Unremarkable ovaries, bladder, and ureters.  Stomach and bowel loops grossly normal for exam lacking GI contrast.  Minimal nonspecific free pelvic fluid.  No mass, adenopathy, free air, significant ascites, or hernia.  Osseous structures unremarkable.  IMPRESSION: Enlarged uterus without discrete mass by CT ; if there is clinical concern for uterine mass/leiomyomata consider follow-up pelvic and transvaginal sonography for further assessment.  Otherwise negative exam.   Electronically Signed   By: Lavonia Dana M.D.   On: 06/04/2015 07:44   Dg Abd Acute W/chest  06/04/2015   CLINICAL DATA:  Lower abdominal pain, onset 2 days ago. Intermittent nausea.  EXAM: DG ABDOMEN ACUTE W/ 1V CHEST  COMPARISON:  None.  FINDINGS: There is no evidence of dilated bowel loops or free intraperitoneal air. No radiopaque calculi or other significant radiographic abnormality is seen. Heart size and mediastinal contours are within normal limits. Both lungs are clear.  IMPRESSION: Negative abdominal radiographs.  No acute cardiopulmonary disease.   Electronically Signed   By: Andreas Newport M.D.   On: 06/04/2015 00:22    Microbiology: No results found for this or any previous visit (from the past 240 hour(s)).   Labs: Basic Metabolic Panel:  Recent Labs Lab 06/03/15 2217 06/04/15 0306 06/04/15 0600 06/05/15 0519  NA 134*  --   --  131*  K 3.3*  --   --  3.5  CL 98*  --   --  102  CO2 25  --   --  25  GLUCOSE 121*  --   --  106*  BUN 27*  --   --  18  CREATININE 1.08*  --  0.82 0.79  CALCIUM 9.5  --   --  8.6*  MG  --  2.2  --   --    Liver Function Tests: No results for input(s): AST, ALT, ALKPHOS, BILITOT, PROT, ALBUMIN in the last 168 hours. No results for input(s): LIPASE,  AMYLASE in the last 168  hours. No results for input(s): AMMONIA in the last 168 hours. CBC:  Recent Labs Lab 06/03/15 2217 06/04/15 0600 06/04/15 1554 06/05/15 0519  WBC 8.5 7.0 8.4 8.1  HGB 7.0* 7.3* 9.1* 9.8*  HCT 23.1* 23.9* 28.7* 30.3*  MCV 67.0* 70.3* 72.5* 73.0*  PLT 608* 475* 471* 432*   Cardiac Enzymes:  Recent Labs Lab 06/04/15 0100 06/04/15 0600 06/04/15 1554  TROPONINI <0.03 <0.03 <0.03   BNP: BNP (last 3 results)  Recent Labs  12/22/14 0850 02/13/15 1650  BNP 81.2 78.6    ProBNP (last 3 results)  Recent Labs  11/08/14 1227  PROBNP 185.1*    CBG: No results for input(s): GLUCAP in the last 168 hours.     SignedKelvin Cellar  [facility] 06/05/2015, 11:23 AM

## 2015-06-14 ENCOUNTER — Telehealth: Payer: Self-pay | Admitting: Cardiology

## 2015-06-14 ENCOUNTER — Encounter: Payer: No Typology Code available for payment source | Admitting: *Deleted

## 2015-06-14 NOTE — Telephone Encounter (Signed)
Spoke with pt and reminded pt of remote transmission that is due today. Pt verbalized understanding.   

## 2015-06-15 ENCOUNTER — Encounter: Payer: Self-pay | Admitting: Cardiology

## 2015-07-03 ENCOUNTER — Encounter: Payer: Self-pay | Admitting: *Deleted

## 2015-07-23 ENCOUNTER — Encounter (HOSPITAL_COMMUNITY): Payer: Self-pay | Admitting: Emergency Medicine

## 2015-07-23 ENCOUNTER — Emergency Department (HOSPITAL_COMMUNITY)
Admission: EM | Admit: 2015-07-23 | Discharge: 2015-07-23 | Disposition: A | Discharge status: Home / Self Care | Payer: No Typology Code available for payment source | Source: Home / Self Care

## 2015-07-23 DIAGNOSIS — H5712 Ocular pain, left eye: Secondary | ICD-10-CM

## 2015-07-23 DIAGNOSIS — H1132 Conjunctival hemorrhage, left eye: Secondary | ICD-10-CM

## 2015-07-23 LAB — GLUCOSE, CAPILLARY: Glucose-Capillary: 100 mg/dL — ABNORMAL HIGH (ref 65–99)

## 2015-07-23 MED ORDER — ACETAMINOPHEN 325 MG PO TABS
ORAL_TABLET | ORAL | Status: AC
  Filled 2015-07-23: qty 3

## 2015-07-23 MED ORDER — TETRACAINE HCL 0.5 % OP SOLN
OPHTHALMIC | Status: AC
  Filled 2015-07-23: qty 2

## 2015-07-23 NOTE — Discharge Instructions (Signed)
Your eye pain may be just from simple irritation or the beginnings of glaucoma. Please follow-up with Dr. Valetta Close tomorrow morning at 8:30 AM. Please purchase some artificial tears for pain relief. You can use Tylenol every 8 hours for additional pain relief. Please go to the emergency room if you get significantly worse.

## 2015-07-23 NOTE — ED Provider Notes (Signed)
CSN: [id number]     Arrival date & time 07/23/15  1844 History   None    Chief Complaint  Patient presents with  . Eye Pain   (Consider location/radiation/quality/duration/timing/severity/associated sxs/prior Treatment) HPI Left eye pain. Started at approximately 14:30 today. Acute onset. Constant. Getting worse. Associated with red this of the white of her eye. Now with some nausea. Vision change.   Past Medical History  Diagnosis Date  . CHF (congestive heart failure)     Dx 03/2012 - dilated cardiomyopathy with EF 20-25% by echo (abnl nuc but normal coronaries 04/02/12 per cath.   . Anemia     felt to be due to heavy menstrual flow  . Chronic systolic heart failure   . Hypertensive heart disease with CHF   . Hypertension   . Dysrhythmia     Bradycardia  . 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 Atrial tachycardia     s/p ablation  . 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 Tubal ligation    . Appendectomy    . Cardiac catheterization  April 2013    normal coronaries  . Icd  08/13/2012  . Ep study and ablation  01/07/13    Ablation of AVNRT and atrial tachycardia (arising from the anteroseptal RA 25mm above the HIS)  . Left heart catheterization with coronary angiogram [name] 04/02/2012    Procedure: LEFT HEART CATHETERIZATION WITH CORONARY ANGIOGRAM;  Surgeon: Peter M Martinique, MD;  Location: [facility] CATH LAB;  Service: Cardiovascular;  Laterality: [name];  . Implantable cardioverter defibrillator implant [name] 08/13/2012    Procedure: IMPLANTABLE CARDIOVERTER DEFIBRILLATOR IMPLANT;  Surgeon: Deboraha Sprang, MD;  Location: [facility] CATH LAB;  Service: Cardiovascular;  Laterality: [name];  . Supraventricular tachycardia ablation [name] 01/07/2013    Procedure: SUPRAVENTRICULAR TACHYCARDIA ABLATION;  Surgeon: Thompson Grayer, MD;  Location: [facility] CATH LAB;  Service: Cardiovascular;  Laterality: [name];   Family History   Problem Relation Age of Onset  . Breast cancer Mother   . Cancer Father    Social History  Substance Use Topics  . Smoking status: Never Smoker   . Smokeless tobacco: Never Used  . Alcohol Use: 0.6 oz/week    1 Glasses of wine per week     Comment: daily   OB History    No data available     Review of Systems Per HPI with all other pertinent systems negative.   Allergies  Review of patient's allergies indicates no known allergies.  Home Medications   Prior to Admission medications   Medication Sig Start Date End Date Taking? Authorizing Provider  amiodarone (PACERONE) 200 MG tablet Take 1 tablet (200 mg total) by mouth 2 (two) times daily. 07/18/14  Yes Rande Brunt, NP  carvedilol (COREG) 3.125 MG tablet Take 1 tablet (3.125 mg total) by mouth 2 (two) times daily with a meal. 12/23/14  Yes Liliane Shi, PA-C  ferrous sulfate 325 (65 FE) MG tablet Take 325 mg by mouth daily with breakfast.   Yes Historical Provider, MD  furosemide (LASIX) 40 MG tablet Take 1.5 tablets (60 mg total) by mouth 2 (two) times daily. 02/13/15  Yes Larey Dresser, MD  lisinopril (PRINIVIL,ZESTRIL) 10 MG tablet Take 1 tab in AM and 2 tabs in PM 12/23/14  Yes Scott T Kathlen Mody, PA-C  megestrol (MEGACE) 40 MG tablet Take 2 tablets (80 mg total) by mouth 2 (two)  times daily. 06/05/15  Yes Kelvin Cellar, MD  Multiple Vitamin (MULTIVITAMIN WITH MINERALS) TABS Take 1 tablet by mouth daily.   Yes Historical Provider, MD  Multiple Vitamins-Minerals (HAIR/SKIN/NAILS) TABS Take 1 tablet by mouth daily.   Yes Historical Provider, MD  pantoprazole (PROTONIX) 40 MG tablet Take 1 tablet (40 mg total) by mouth daily at 12 noon. 02/05/14  Yes Barton Dubois, MD  potassium chloride SA (K-DUR,KLOR-CON) 20 MEQ tablet Take 2 tablets (40 mEq total) by mouth daily. 02/13/15  Yes Larey Dresser, MD  spironolactone (ALDACTONE) 25 MG tablet Take 1 tablet (25 mg total) by mouth daily. 04/02/15  Yes Jolaine Artist, MD  Doxylamine  Succinate, Sleep, (SLEEP AID PO) Take 1 tablet by mouth at bedtime as needed (sleep).     Historical Provider, MD  oxyCODONE (OXY IR/ROXICODONE) 5 MG immediate release tablet Take 1 tablet (5 mg total) by mouth every 6 (six) hours as needed for severe pain. 06/05/15   Kelvin Cellar, MD   BP 151/95 mmHg  Pulse 87  Temp(Src) 99.2 F (37.3 C) (Oral)  Resp 16  SpO2 96%  LMP 07/09/2015 (Exact Date) Physical Exam Physical Exam  Constitutional: oriented to person, place, and time. appears well-developed and well-nourished. No distress.  HENT:  Head: Normocephalic and atraumatic.  Eyes: Left conjunctival hemorrhaging noted. Neck: Normal range of motion.  Cardiovascular: RRR, no m/r/g, 2+ distal pulses,  Pulmonary/Chest: Effort normal and breath sounds normal. No respiratory distress.  Abdominal: Soft. Bowel sounds are normal. NonTTP, no distension.  Musculoskeletal: Normal range of motion. Non ttp, no effusion.  Neurological: alert and oriented to person, place, and time.  Skin: Skin is warm. No rash noted. non diaphoretic.  Psychiatric: normal mood and affect. behavior is normal. Judgment and thought content normal.   ED Course  Procedures (including critical care time) Labs Review Labs Reviewed  GLUCOSE, CAPILLARY - Abnormal; Notable for the following:    Glucose-Capillary 100 (*)    All other components within normal limits    Imaging Review No results found.   MDM   1. Eye pain, left   2. Subconjunctival hemorrhage of left eye        In office Tono-Pen used showing right ocular pressure of 23.3 and left ocular pressure of 33.6. Exam results are suboptimal though due to patient resisting exam even after tetracaine drops. Foreseen exam without obvious abnormality. Consult did on-call ophthalmology Dr.Bowen, who feels the patient is safe for discharge home and follow up tomorrow morning in his clinic 8:30 AM. Patient was given Isordil 5 mg by mouth Tylenol for her headache.  Patient is without a history of glaucoma or other eye trauma. Last eye exam several years ago by the optometrist and was told that she did not need glasses. Of note tonight her vision is 20/50 in the right eye 20/70 in the left eye and 20/50 bilateral. Vision is uncorrected.      Waldemar Dickens, MD 07/23/15 [phone]

## 2015-07-23 NOTE — ED Notes (Signed)
Patient stated that she was eating earlier today and started to cough and vomit. She stated that after that her left eye was very sore and red.

## 2015-07-25 ENCOUNTER — Ambulatory Visit (INDEPENDENT_AMBULATORY_CARE_PROVIDER_SITE_OTHER): Payer: No Typology Code available for payment source | Admitting: *Deleted

## 2015-07-25 DIAGNOSIS — I5022 Chronic systolic (congestive) heart failure: Secondary | ICD-10-CM | POA: Diagnosis not present

## 2015-07-25 DIAGNOSIS — I472 Ventricular tachycardia, unspecified: Secondary | ICD-10-CM

## 2015-07-25 DIAGNOSIS — I429 Cardiomyopathy, unspecified: Secondary | ICD-10-CM | POA: Diagnosis not present

## 2015-07-25 DIAGNOSIS — I420 Dilated cardiomyopathy: Secondary | ICD-10-CM

## 2015-07-25 DIAGNOSIS — Z95810 Presence of automatic (implantable) cardiac defibrillator: Secondary | ICD-10-CM | POA: Diagnosis not present

## 2015-07-25 LAB — CUP PACEART INCLINIC DEVICE CHECK
Battery Voltage: 3.11 V
Brady Statistic AP VP Percent: 0 %
Brady Statistic AP VS Percent: 0.07 %
Brady Statistic RA Percent Paced: 0.07 %
Brady Statistic RV Percent Paced: 0.04 %
HIGH POWER IMPEDANCE MEASURED VALUE: 19 Ohm
HIGH POWER IMPEDANCE MEASURED VALUE: 361 Ohm
HighPow Impedance: 75 Ohm
Lead Channel Impedance Value: 361 Ohm
Lead Channel Impedance Value: 418 Ohm
Lead Channel Pacing Threshold Amplitude: 0.375 V
Lead Channel Sensing Intrinsic Amplitude: 1.875 mV
Lead Channel Sensing Intrinsic Amplitude: 10.75 mV
Lead Channel Sensing Intrinsic Amplitude: 15.375 mV
Lead Channel Setting Pacing Amplitude: 2 V
Lead Channel Setting Pacing Amplitude: 2.5 V
Lead Channel Setting Pacing Pulse Width: 0.4 ms
Lead Channel Setting Sensing Sensitivity: 0.3 mV
MDC IDC MSMT LEADCHNL RA PACING THRESHOLD PULSEWIDTH: 0.4 ms
MDC IDC MSMT LEADCHNL RA SENSING INTR AMPL: 2 mV
MDC IDC MSMT LEADCHNL RV PACING THRESHOLD AMPLITUDE: 1 V
MDC IDC MSMT LEADCHNL RV PACING THRESHOLD PULSEWIDTH: 0.4 ms
MDC IDC SESS DTM: 20160817171118
MDC IDC SET ZONE DETECTION INTERVAL: 240 ms
MDC IDC SET ZONE DETECTION INTERVAL: 280 ms
MDC IDC STAT BRADY AS VP PERCENT: 0.03 %
MDC IDC STAT BRADY AS VS PERCENT: 99.9 %
Zone Setting Detection Interval: 270 ms
Zone Setting Detection Interval: 350 ms
Zone Setting Detection Interval: 350 ms

## 2015-07-25 NOTE — Progress Notes (Signed)
ICD check in clinic. Normal device function. Thresholds and sensing consistent with previous device measurements. Impedance trends stable over time. 2 NSVT---longer 17min39sec, pt was symptomatic. No mode switches. Histogram distribution appropriate for patient and level of activity. No changes made this session. Device programmed at appropriate safety margins. Device programmed to optimize intrinsic conduction. Remaining power 3.11V. Alert tone not demonstrated for patient, pt knows to call clinic if heard. [facility] 10/24/15 & ROV w/ SK 4/17.

## 2015-08-03 ENCOUNTER — Other Ambulatory Visit (HOSPITAL_COMMUNITY): Payer: Self-pay | Admitting: Anesthesiology

## 2015-08-08 ENCOUNTER — Encounter: Payer: No Typology Code available for payment source | Admitting: Obstetrics & Gynecology

## 2015-09-10 ENCOUNTER — Encounter: Payer: Self-pay | Admitting: Internal Medicine

## 2015-10-01 ENCOUNTER — Telehealth (HOSPITAL_COMMUNITY): Payer: Self-pay

## 2015-10-01 NOTE — Telephone Encounter (Signed)
OB GYN would like for [place] to fill out form at her Wednesday appointment they are faxing for pre-op clearance. Tele: [phone] Ext [phone]

## 2015-10-02 ENCOUNTER — Other Ambulatory Visit: Payer: Self-pay | Admitting: Obstetrics & Gynecology

## 2015-10-03 ENCOUNTER — Encounter (HOSPITAL_COMMUNITY): Payer: Self-pay

## 2015-10-03 ENCOUNTER — Ambulatory Visit (HOSPITAL_COMMUNITY)
Admission: RE | Admit: 2015-10-03 | Discharge: 2015-10-03 | Disposition: A | Discharge status: Home / Self Care | Payer: No Typology Code available for payment source | Source: Ambulatory Visit | Attending: Cardiology | Admitting: Cardiology

## 2015-10-03 ENCOUNTER — Other Ambulatory Visit: Payer: Self-pay

## 2015-10-03 VITALS — BP 128/74 | HR 90 | Wt 205.4 lb

## 2015-10-03 DIAGNOSIS — I5022 Chronic systolic (congestive) heart failure: Secondary | ICD-10-CM | POA: Diagnosis not present

## 2015-10-03 DIAGNOSIS — I471 Supraventricular tachycardia: Secondary | ICD-10-CM | POA: Diagnosis not present

## 2015-10-03 DIAGNOSIS — D649 Anemia, unspecified: Secondary | ICD-10-CM | POA: Diagnosis not present

## 2015-10-03 DIAGNOSIS — Z95810 Presence of automatic (implantable) cardiac defibrillator: Secondary | ICD-10-CM | POA: Diagnosis not present

## 2015-10-03 DIAGNOSIS — Z79899 Other long term (current) drug therapy: Secondary | ICD-10-CM | POA: Insufficient documentation

## 2015-10-03 DIAGNOSIS — I110 Hypertensive heart disease with heart failure: Secondary | ICD-10-CM | POA: Diagnosis not present

## 2015-10-03 DIAGNOSIS — I428 Other cardiomyopathies: Secondary | ICD-10-CM | POA: Diagnosis not present

## 2015-10-03 LAB — CBC
HEMATOCRIT: 31.4 % — AB (ref 36.0–46.0)
HEMOGLOBIN: 10.3 g/dL — AB (ref 12.0–15.0)
MCH: 27.6 pg (ref 26.0–34.0)
MCHC: 32.8 g/dL (ref 30.0–36.0)
MCV: 84.2 fL (ref 78.0–100.0)
Platelets: 325 10*3/uL (ref 150–400)
RBC: 3.73 MIL/uL — AB (ref 3.87–5.11)
RDW: 16.5 % — ABNORMAL HIGH (ref 11.5–15.5)
WBC: 6.9 10*3/uL (ref 4.0–10.5)

## 2015-10-03 LAB — COMPREHENSIVE METABOLIC PANEL
ALT: 20 U/L (ref 14–54)
ANION GAP: 8 (ref 5–15)
AST: 27 U/L (ref 15–41)
Albumin: 3.5 g/dL (ref 3.5–5.0)
Alkaline Phosphatase: 60 U/L (ref 38–126)
BILIRUBIN TOTAL: 0.4 mg/dL (ref 0.3–1.2)
BUN: 17 mg/dL (ref 6–20)
CO2: 27 mmol/L (ref 22–32)
Calcium: 9.3 mg/dL (ref 8.9–10.3)
Chloride: 102 mmol/L (ref 101–111)
Creatinine, Ser: 1.21 mg/dL — ABNORMAL HIGH (ref 0.44–1.00)
GFR calc Af Amer: 59 mL/min — ABNORMAL LOW (ref >60)
GFR calc non Af Amer: 51 mL/min — ABNORMAL LOW (ref >60)
Glucose, Bld: 100 mg/dL — ABNORMAL HIGH (ref 65–99)
Potassium: 3.1 mmol/L — ABNORMAL LOW (ref 3.5–5.1)
SODIUM: 137 mmol/L (ref 135–145)
TOTAL PROTEIN: 7.4 g/dL (ref 6.5–8.1)

## 2015-10-03 LAB — TSH: TSH: 2.246 u[IU]/mL (ref 0.350–4.500)

## 2015-10-03 MED ORDER — CARVEDILOL 6.25 MG PO TABS: 6.2500 mg | ORAL_TABLET | Freq: Two times a day (BID) | ORAL | Status: DC

## 2015-10-03 NOTE — Patient Instructions (Signed)
Labs today will call if abnormal   Increase Carvedilol 6.25 twice a day  Follow up in 2 months  Cleared for surgery

## 2015-10-03 NOTE — Progress Notes (Signed)
Patient ID: Sydney Shaffer, female   DOB: 1966/02/03, 49 y.o.   MRN: [id number] EP: Dr Caryl Comes Medtronic ICD PCP: None   HPI: Sydney Shaffer is a 49 y.o. female with a history of nonischemic cardiomyopathy s/p Medtronic ICD, HTN, chronic systolic heart failure and SVT s/p 01/08/14 ablation.    Admitted 02/03/14 with chest pain through 02/05/14. Troponin negative. She had been out of metoprolol. She was discharged on coreg 12.5 mg twice a day, lasix 40 mg twice a day, spiro 12.5 mg daily, and lisinopril 5 mg daily.  Discharge weight 195 pounds.    She has had trouble with anemia thought to be due to menorrhagia.  She was admitted in 6/16 with symptomatic anemia and transfused.   She returns for HF follow up. She has prominent fatigue generally.  No dyspnea walking on flat ground and can climb 2 flights of steps without problems.  Some dyspnea with heavy exertion.  No chest pain.  No orthopnea/PND.  Needs hysteroscopy with resection because of bleeding.    Optivol checked today.  Fluid index below threshold, impedance stable. Activity about 4 hours a day.   ECHO 07/05/12 EF 20-25%  ECHO 02/04/14 EF 20-25%   CPX 06/03/14: FVC 1.69 (51%)  FEV1 1.24 (47%)  FEV1/FVC 74%  Peak VO2: 12.6 (57.8% predicted peak VO2) VE/VCO2 slope: 28.6 OUES: 1.60 Peak RER: 1.01 Peak VO2 adjusted to the patient's ideal body weight of 150 lb (67.8 kg) the peak VO2 is 16.5 ml/kg (ibw)/min (62% of the ibw-adjusted predicted).  Labs 12/04/14 K 3.3 Creatinine 1.13 Labs 11/08/14 K 3.1 Creatinine 0.8 TSH 1.99 Free T4 0.84  Labs 1/16 K 3.5, creatinine 0.92, HCT 26.4 Labs 02/13/2015 K 3.9 Creatinine 0.91  Labs 6/16 K 3.5, creatinine 0.79, HCT 30.3  ECG: NSR, iRBBB, nonspecific T wave flattening  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 She does not drink alcohol . Lives alone. She has 3 grown children   FH: Mom breast cancer . Father cancer   ROS: All systems negative except as listed in HPI, PMH and  Problem List.  Past Medical History  Diagnosis Date  . CHF (congestive heart failure) ([place])     Dx 03/2012 - dilated cardiomyopathy with EF 20-25% by echo (abnl nuc but normal coronaries 04/02/12 per cath.   . Anemia     felt to be due to heavy menstrual flow  . Chronic systolic heart failure ([place])   . Hypertensive heart disease with CHF ([place])   . Hypertension   . Dysrhythmia     Bradycardia  . 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 Atrial tachycardia (H[facility])     s/p ablation  . 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 amiodarone (PACERONE) 200 MG tablet TAKE ONE TABLET BY MOUTH TWICE DAILY 90 tablet 0  . Doxylamine Succinate, Sleep, (SLEEP AID PO) Take 1 tablet by mouth at bedtime as needed (sleep).     . ferrous sulfate 325 (65 FE) MG tablet Take 325 mg by mouth daily with breakfast.    . furosemide (LASIX) 40 MG tablet Take 1.5 tablets (60 mg total) by mouth 2 (two) times daily. 90 tablet 5  . lisinopril (PRINIVIL,ZESTRIL) 10 MG tablet Take 1 tab in AM and 2 tabs in PM 90 tablet 5  . Multiple Vitamin (MULTIVITAMIN WITH MINERALS) TABS Take 1 tablet by  mouth daily.    . Multiple Vitamins-Minerals (HAIR/SKIN/NAILS) TABS Take 1 tablet by mouth daily.    . pantoprazole (PROTONIX) 40 MG tablet Take 1 tablet (40 mg total) by mouth daily at 12 noon. 30 tablet 1  . potassium chloride SA (K-DUR,KLOR-CON) 20 MEQ tablet Take 2 tablets (40 mEq total) by mouth daily. 60 tablet 3  . spironolactone (ALDACTONE) 25 MG tablet Take 1 tablet (25 mg total) by mouth daily. 90 tablet 3  . carvedilol (COREG) 6.25 MG tablet Take 1 tablet (6.25 mg total) by mouth 2 (two) times daily. 180 tablet 3   No current facility-administered medications for this encounter.    Filed Vitals:   10/03/15 1511  BP: 128/74  Pulse: 90  Weight: 205 lb 6.4 oz (93.169 kg)  SpO2: 98%   PHYSICAL EXAM: General:   NAD. No resp difficulty . Ambulated in the clinic without difficulty.  HEENT: normal Neck: supple. JVP does not appear elevated; Carotids 2+ bilaterally; no bruits. No lymphadenopathy or thryomegaly appreciated. Cor: PMI normal. Regular rate and rhythm. No rubs, gallops or murmurs. Lungs: clear Abdomen: soft, nontender, nondistended. No hepatosplenomegaly. No bruits or masses. Good bowel sounds. Extremities: no cyanosis, clubbing, rash, no edema Neuro: alert & orientedx3, cranial nerves grossly intact. Moves all 4 extremities w/o difficulty. Affect pleasant.   ASSESSMENT & PLAN:  1. Chronic Systolic Heart Failure: Normal coronaries by cath 2013. Nonischemic cardiomyopathy s/p Medtronic ICD. EF 20-25% (01/2014 echo).  NYHA class II symptoms.  Not volume overloaded by exam or by Optivol.    - Continue lasix to 60 mg bid and KCl to 40 daily.  BMET today.  - Continue lisinopril and spironolactone at current doses.  - Increase Coreg to 6.25 mg bid.  - Reinforced the need and importance of daily weights, a low sodium diet, and fluid restriction (less than 2 L a day). Instructed to call the HF clinic if weight increases more than 3 lbs overnight or 5 lbs in a week.  2. SVT: Had ablation for atrial tachycardia in 01/2014.  Continue amiodarone 200 mg twice a day per Dr Caryl Comes.  Check LFTs and TSH today.  She will need regular eye exams while on amiodarone.  3. Anemia:  On iron.  Suspect due to menorrhagia.  She needs hysteroscopy done.  She is stable from a CHF standpoint, so I think she can go ahead with the procedure. Check CBC today.   Follow up in 2 months   [name] 10/03/2015

## 2015-10-04 ENCOUNTER — Encounter (HOSPITAL_COMMUNITY)
Admission: RE | Admit: 2015-10-04 | Discharge: 2015-10-04 | Disposition: A | Discharge status: Home / Self Care | Payer: No Typology Code available for payment source | Source: Ambulatory Visit | Attending: Obstetrics & Gynecology | Admitting: Obstetrics & Gynecology

## 2015-10-04 ENCOUNTER — Encounter (HOSPITAL_COMMUNITY): Payer: Self-pay

## 2015-10-04 DIAGNOSIS — I5022 Chronic systolic (congestive) heart failure: Secondary | ICD-10-CM | POA: Diagnosis not present

## 2015-10-04 DIAGNOSIS — N921 Excessive and frequent menstruation with irregular cycle: Secondary | ICD-10-CM | POA: Diagnosis present

## 2015-10-04 DIAGNOSIS — D649 Anemia, unspecified: Secondary | ICD-10-CM | POA: Diagnosis not present

## 2015-10-04 DIAGNOSIS — Z95810 Presence of automatic (implantable) cardiac defibrillator: Secondary | ICD-10-CM | POA: Diagnosis not present

## 2015-10-04 DIAGNOSIS — I110 Hypertensive heart disease with heart failure: Secondary | ICD-10-CM | POA: Diagnosis not present

## 2015-10-04 DIAGNOSIS — D250 Submucous leiomyoma of uterus: Secondary | ICD-10-CM | POA: Diagnosis not present

## 2015-10-04 DIAGNOSIS — N840 Polyp of corpus uteri: Secondary | ICD-10-CM | POA: Diagnosis not present

## 2015-10-04 DIAGNOSIS — Z79899 Other long term (current) drug therapy: Secondary | ICD-10-CM | POA: Diagnosis not present

## 2015-10-04 DIAGNOSIS — I420 Dilated cardiomyopathy: Secondary | ICD-10-CM | POA: Diagnosis not present

## 2015-10-04 NOTE — Pre-Procedure Instructions (Signed)
EKG, cardiac clearance, and pt's records from [facility] and cardiologist shared with [place].

## 2015-10-04 NOTE — Patient Instructions (Signed)
Your procedure is scheduled on:10/05/15  Enter through the Main Entrance at :2:30 pm Pick up desk phone and dial [phone] and inform us of your arrival.  Please call [phone] if you have any problems the morning of surgery.  Remember: Do not eat food after midnight:tonight Clear liquids are ok until:12 noon on Friday   You may brush your teeth the morning of surgery.  Take these meds the morning of surgery with a sip of water:all usual meds  DO NOT wear jewelry, eye make-up, lipstick,body lotion, or dark fingernail polish.  (Polished toes are ok) You may wear deodorant.  If you are to be admitted after surgery, leave suitcase in car until your room has been assigned. Patients discharged on the day of surgery will not be allowed to drive home. Wear loose fitting, comfortable clothes for your ride home.

## 2015-10-04 NOTE — Pre-Procedure Instructions (Signed)
Dr. Jillyn Hidden notified about patient's K+ result of 3.1- he advised the pt should eat 2 bananas today and double up on her K+ supplement. Patient was given verbal instructions to do so.

## 2015-10-05 ENCOUNTER — Ambulatory Visit (HOSPITAL_COMMUNITY): Payer: No Typology Code available for payment source | Admitting: Anesthesiology

## 2015-10-05 ENCOUNTER — Encounter (HOSPITAL_COMMUNITY): Payer: Self-pay | Admitting: Anesthesiology

## 2015-10-05 ENCOUNTER — Ambulatory Visit (HOSPITAL_COMMUNITY)
Admission: RE | Admit: 2015-10-05 | Discharge: 2015-10-05 | Disposition: A | Discharge status: Home / Self Care | Payer: No Typology Code available for payment source | Source: Ambulatory Visit | Attending: Obstetrics & Gynecology | Admitting: Obstetrics & Gynecology

## 2015-10-05 ENCOUNTER — Encounter (HOSPITAL_COMMUNITY)
Admission: RE | Disposition: A | Discharge status: Home / Self Care | Payer: Self-pay | Source: Ambulatory Visit | Attending: Obstetrics & Gynecology

## 2015-10-05 DIAGNOSIS — I110 Hypertensive heart disease with heart failure: Secondary | ICD-10-CM | POA: Insufficient documentation

## 2015-10-05 DIAGNOSIS — Z79899 Other long term (current) drug therapy: Secondary | ICD-10-CM | POA: Insufficient documentation

## 2015-10-05 DIAGNOSIS — N921 Excessive and frequent menstruation with irregular cycle: Secondary | ICD-10-CM | POA: Insufficient documentation

## 2015-10-05 DIAGNOSIS — Z95810 Presence of automatic (implantable) cardiac defibrillator: Secondary | ICD-10-CM | POA: Insufficient documentation

## 2015-10-05 DIAGNOSIS — I420 Dilated cardiomyopathy: Secondary | ICD-10-CM | POA: Insufficient documentation

## 2015-10-05 DIAGNOSIS — Z419 Encounter for procedure for purposes other than remedying health state, unspecified: Secondary | ICD-10-CM

## 2015-10-05 DIAGNOSIS — N840 Polyp of corpus uteri: Secondary | ICD-10-CM | POA: Insufficient documentation

## 2015-10-05 DIAGNOSIS — D649 Anemia, unspecified: Secondary | ICD-10-CM | POA: Insufficient documentation

## 2015-10-05 DIAGNOSIS — I5022 Chronic systolic (congestive) heart failure: Secondary | ICD-10-CM | POA: Insufficient documentation

## 2015-10-05 DIAGNOSIS — D250 Submucous leiomyoma of uterus: Secondary | ICD-10-CM | POA: Insufficient documentation

## 2015-10-05 HISTORY — PX: DILITATION & CURRETTAGE/HYSTROSCOPY WITH VERSAPOINT RESECTION: SHX5571

## 2015-10-05 LAB — PREGNANCY, URINE: PREG TEST UR: NEGATIVE

## 2015-10-05 SURGERY — DILATATION & CURETTAGE/HYSTEROSCOPY WITH VERSAPOINT RESECTION
Anesthesia: Spinal

## 2015-10-05 MED ORDER — PROPOFOL 500 MG/50ML IV EMUL: INTRAVENOUS | Status: DC | PRN

## 2015-10-05 MED ORDER — FENTANYL CITRATE (PF) 100 MCG/2ML IJ SOLN: 25.0000 ug | INTRAMUSCULAR | Status: DC | PRN

## 2015-10-05 MED ORDER — MIDAZOLAM HCL 2 MG/2ML IJ SOLN
INTRAMUSCULAR | Status: AC
  Filled 2015-10-05: qty 2

## 2015-10-05 MED ORDER — LIDOCAINE HCL (CARDIAC) 20 MG/ML IV SOLN
INTRAVENOUS | Status: AC
  Filled 2015-10-05: qty 5

## 2015-10-05 MED ORDER — LACTATED RINGERS IV SOLN
INTRAVENOUS | Status: DC
  Administered 2015-10-05: 13:00:00 via INTRAVENOUS

## 2015-10-05 MED ORDER — METOCLOPRAMIDE HCL 5 MG/ML IJ SOLN: 10.0000 mg | Freq: Once | INTRAMUSCULAR | Status: DC | PRN

## 2015-10-05 MED ORDER — PHENYLEPHRINE 8 MG IN D5W 100 ML (0.08MG/ML) PREMIX OPTIME
INJECTION | INTRAVENOUS | Status: AC
  Filled 2015-10-05: qty 100

## 2015-10-05 MED ORDER — CEFAZOLIN SODIUM-DEXTROSE 2-3 GM-% IV SOLR: 2.0000 g | INTRAVENOUS | Status: DC

## 2015-10-05 MED ORDER — FENTANYL CITRATE (PF) 250 MCG/5ML IJ SOLN
INTRAMUSCULAR | Status: AC
  Filled 2015-10-05: qty 5

## 2015-10-05 MED ORDER — FENTANYL CITRATE (PF) 100 MCG/2ML IJ SOLN
INTRAMUSCULAR | Status: DC | PRN
Start: 2015-10-05 — End: 2015-10-05
  Administered 2015-10-05: 25 ug via INTRAVENOUS

## 2015-10-05 MED ORDER — DEXAMETHASONE SODIUM PHOSPHATE 10 MG/ML IJ SOLN
INTRAMUSCULAR | Status: AC
  Filled 2015-10-05: qty 1

## 2015-10-05 MED ORDER — CEFAZOLIN SODIUM-DEXTROSE 2-3 GM-% IV SOLR
INTRAVENOUS | Status: AC
  Administered 2015-10-05: 2 g via INTRAVENOUS
  Filled 2015-10-05: qty 50

## 2015-10-05 MED ORDER — MIDAZOLAM HCL 5 MG/5ML IJ SOLN
INTRAMUSCULAR | Status: DC | PRN
  Administered 2015-10-05: 2 mg via INTRAVENOUS

## 2015-10-05 MED ORDER — CHLOROPROCAINE HCL 1 % IJ SOLN
INTRAMUSCULAR | Status: DC | PRN
  Administered 2015-10-05: 20 mL

## 2015-10-05 MED ORDER — CHLOROPROCAINE HCL 1 % IJ SOLN
INTRAMUSCULAR | Status: AC
  Filled 2015-10-05: qty 30

## 2015-10-05 MED ORDER — KETOROLAC TROMETHAMINE 30 MG/ML IJ SOLN
INTRAMUSCULAR | Status: AC
  Filled 2015-10-05: qty 1

## 2015-10-05 MED ORDER — PROPOFOL 10 MG/ML IV BOLUS
INTRAVENOUS | Status: AC
  Filled 2015-10-05: qty 20

## 2015-10-05 MED ORDER — BUPIVACAINE IN DEXTROSE 0.75-8.25 % IT SOLN
INTRATHECAL | Status: DC | PRN
  Administered 2015-10-05: 1.4 mL via INTRATHECAL

## 2015-10-05 MED ORDER — PHENYLEPHRINE HCL 10 MG/ML IJ SOLN
20.0000 mg | INTRAMUSCULAR | Status: DC | PRN
  Administered 2015-10-05: 30 ug/min via INTRAVENOUS

## 2015-10-05 MED ORDER — PHENYLEPHRINE HCL 10 MG/ML IJ SOLN
INTRAMUSCULAR | Status: DC | PRN
  Administered 2015-10-05: 80 ug via INTRAVENOUS

## 2015-10-05 MED ORDER — ONDANSETRON HCL 4 MG/2ML IJ SOLN
INTRAMUSCULAR | Status: AC
  Filled 2015-10-05: qty 2

## 2015-10-05 MED ORDER — OXYCODONE-ACETAMINOPHEN 7.5-325 MG PO TABS: 1.0000 | ORAL_TABLET | ORAL | Status: DC | PRN

## 2015-10-05 MED ORDER — MEPERIDINE HCL 25 MG/ML IJ SOLN: 6.2500 mg | INTRAMUSCULAR | Status: DC | PRN

## 2015-10-05 SURGICAL SUPPLY — 16 items
CANISTER SUCT 3000ML (MISCELLANEOUS) ×2 IMPLANT
CATH ROBINSON RED A/P 16FR (CATHETERS) ×2 IMPLANT
CLOTH BEACON ORANGE TIMEOUT ST (SAFETY) ×2 IMPLANT
CONTAINER PREFILL 10% NBF 60ML (FORM) ×4 IMPLANT
ELECTRODE ROLLER VERSAPOINT (ELECTRODE) IMPLANT
ELECTRODE RT ANGLE VERSAPOINT (CUTTING LOOP) IMPLANT
GLOVE BIO SURGEON STRL SZ 6.5 (GLOVE) ×2 IMPLANT
GLOVE BIOGEL PI IND STRL 7.0 (GLOVE) ×2 IMPLANT
GLOVE BIOGEL PI INDICATOR 7.0 (GLOVE) ×2
GOWN STRL REUS W/TWL LRG LVL3 (GOWN DISPOSABLE) ×4 IMPLANT
PACK VAGINAL MINOR WOMEN LF (CUSTOM PROCEDURE TRAY) ×2 IMPLANT
PAD OB MATERNITY 4.3X12.25 (PERSONAL CARE ITEMS) ×2 IMPLANT
TOWEL OR 17X24 6PK STRL BLUE (TOWEL DISPOSABLE) ×4 IMPLANT
TUBING AQUILEX INFLOW (TUBING) ×2 IMPLANT
TUBING AQUILEX OUTFLOW (TUBING) ×2 IMPLANT
WATER STERILE IRR 1000ML POUR (IV SOLUTION) ×2 IMPLANT

## 2015-10-05 NOTE — Discharge Summary (Signed)
  Physician Discharge Summary  Patient ID: Sydney Shaffer MRN: [id number] DOB/AGE: [birth year] 48 y.o.  Admit date: 10/05/2015 Discharge date: 10/05/2015  Admission Diagnoses:  Intrauterine polyp, Submucosal Myoma with Menometrorrhagia  Discharge Diagnoses:  Intrauterine polyp, Submucosal Myoma with Menometrorrhagia         Active Problems:   * No active hospital problems. *   Discharged Condition: good  Hospital Course: Outpatient  Consults: None    Treatments: surgery: Hysteroscopy Versapoint resection of Submucosal Myoma and Endometrial Polyp with D+C  Disposition: 01-Home or Self Care     Medication List    TAKE these medications        amiodarone 200 MG tablet  Commonly known as:  PACERONE  TAKE ONE TABLET BY MOUTH TWICE DAILY     carvedilol 6.25 MG tablet  Commonly known as:  COREG  Take 1 tablet (6.25 mg total) by mouth 2 (two) times daily.     ferrous sulfate 325 (65 FE) MG tablet  Take 325 mg by mouth daily with breakfast.     furosemide 40 MG tablet  Commonly known as:  LASIX  Take 1.5 tablets (60 mg total) by mouth 2 (two) times daily.     HAIR/SKIN/NAILS Tabs  Take 1 tablet by mouth daily.     lisinopril 10 MG tablet  Commonly known as:  PRINIVIL,ZESTRIL  Take 1 tab in AM and 2 tabs in PM     multivitamin with minerals Tabs tablet  Take 1 tablet by mouth daily.     oxyCODONE-acetaminophen 7.5-325 MG tablet  Commonly known as:  PERCOCET  Take 1 tablet by mouth every 4 (four) hours as needed for severe pain.     pantoprazole 40 MG tablet  Commonly known as:  PROTONIX  Take 1 tablet (40 mg total) by mouth daily at 12 noon.     potassium chloride SA 20 MEQ tablet  Commonly known as:  K-DUR,KLOR-CON  Take 2 tablets (40 mEq total) by mouth daily.     SLEEP AID PO  Take 1 tablet by mouth at bedtime as needed (sleep).     spironolactone 25 MG tablet  Commonly known as:  ALDACTONE  Take 1 tablet (25 mg total) by mouth daily.            Follow-up Information    Follow up with Arsalan Brisbin,MARIE-LYNE, MD In 3 weeks.   Specialty:  Obstetrics and Gynecology   Contact information:   [place] [address] [phone]       Signed: [name], MD 10/05/2015, 3:11 PM

## 2015-10-05 NOTE — Discharge Instructions (Signed)
Hysteroscopy, Care After Refer to this sheet in the next few weeks. These instructions provide you with information on caring for yourself after your procedure. Your health care provider may also give you more specific instructions. Your treatment has been planned according to current medical practices, but problems sometimes occur. Call your health care provider if you have any problems or questions after your procedure.  WHAT TO EXPECT AFTER THE PROCEDURE After your procedure, it is typical to have the following:  You may have some cramping. This normally lasts for a couple days.  You may have bleeding. This can vary from light spotting for a few days to menstrual-like bleeding for 3-7 days. HOME CARE INSTRUCTIONS  Rest for the first 1-2 days after the procedure.  Only take over-the-counter or prescription medicines as directed by your health care provider. Do not take aspirin. It can increase the chances of bleeding.  Take showers instead of baths for 2 weeks or as directed by your health care provider.  Do not drive for 24 hours or as directed.  Do not drink alcohol while taking pain medicine.  Do not use tampons, douche, or have sexual intercourse for 2 weeks or until your health care provider says it is okay.  Take your temperature twice a day for 4-5 days. Write it down each time.  Follow your health care provider's advice about diet, exercise, and lifting.  If you develop constipation, you may:  Take a mild laxative if your health care provider approves.  Add bran foods to your diet.  Drink enough fluids to keep your urine clear or pale yellow.  Try to have someone with you or available to you for the first 24-48 hours, especially if you were given a general anesthetic.  Follow up with your health care provider as directed. SEEK MEDICAL CARE IF:  You feel dizzy or lightheaded.  You feel sick to your stomach (nauseous).  You have abnormal vaginal discharge.  You  have a rash.  You have pain that is not controlled with medicine. SEEK IMMEDIATE MEDICAL CARE IF:  You have bleeding that is heavier than a normal menstrual period.  You have a fever.  You have increasing cramps or pain, not controlled with medicine.  You have new belly (abdominal) pain.  You pass out.  You have pain in the tops of your shoulders (shoulder strap areas).  You have shortness of breath.   This information is not intended to replace advice given to you by your health care provider. Make sure you discuss any questions you have with your health care provider.   Document Released: 09/14/2013 Document Reviewed: 09/14/2013 Elsevier Interactive Patient Education [employer].

## 2015-10-05 NOTE — Op Note (Signed)
10/05/2015  2:58 PM  PATIENT:  Sydney Shaffer  49 y.o. female  PRE-OPERATIVE DIAGNOSIS: Intrauterine Polyp, Submucosal Myoma with Menometrorrhagia  POST-OPERATIVE DIAGNOSIS: Intrauterine Polyp, Submucosal Myoma with Menometrorrhagia  INTERVENTIONS: DILATATION & CURETTAGE/HYSTEROSCOPY WITH VERSAPOINT RESECTION  PROCEDURE:  Under spinal anesthesia, the patient is in lithotomy position. She is prepped with Betadine on the suprapubic, vulvar and vaginal areas. He bladder was catheterized. She is draped as usual. The vaginal exam reveals an anteverted uterus mildly increased in volume and no adnexal mass. The speculum is inserted in the vagina and the anterior lip of the cervix was grasped with a tenaculum.  A paracervical block is done with Nesacaine 1% a total of 20 cc at 4 and 8:00. Dilation of the cervix with Hegar dilators up to #33 without difficulty.  The operative hysteroscope was inserted in the intrauterine cavity with the VersaPoint loop.  Inspection of the intrauterine cavity reveals a probable submucosal myoma on the anterior wall measuring about 2 cm in diameter.  Just to the left of it on the anterior wall a small endometrial polyp is present.  The endometrium is thickened in many areas and mildly increased vascularity is present.  Pictures were taken before any resection and curettage.  Both ostia were well visualized.  The anterior submucosal myoma was completely resected with the VersaPoint as well as the anterior polyp. We also resected under direct vision the areas of increased thickness of the endometrium and increased vascularity.  All resection pieces were removed from the intrauterine cavity. We then proceeded with a systematic curettage of the intrauterine cavity with a sharp curet on all surfaces. Both specimens were sent together to pathology.  Hemostasis was adequate.Pictures were taken at the end of the procedure.  All instruments were removed.  The patient was brought to recovery  room in stable and good status.   SURGEON:  Surgeon(s): [name], MD  ASSISTANTS: none   ANESTHESIA:   spinal  ESTIMATED BLOOD LOSS: 25 cc FLUID DEFICIT:  1920 cc   Intake/Output Summary (Last 24 hours) at 10/05/15 1458 Last data filed at 10/05/15 1454  Gross per 24 hour  Intake    300 ml  Output     75 ml  Net    225 ml     BLOOD ADMINISTERED:none   LOCAL MEDICATIONS USED:  Nesacaine 1%, amount: 20 ml  SPECIMEN:  Source of Specimen:  Resection of SM Myoma, Polyp and Endometrial curettings  DISPOSITION OF SPECIMEN:  Patho  COUNTS:  YES  PLAN OF CARE: Transfer to PACU  [name] MD  10/05/2015 at 2:59 pm

## 2015-10-05 NOTE — Anesthesia Preprocedure Evaluation (Addendum)
Anesthesia Evaluation  Patient identified by MRN, date of birth, ID band Patient awake    Reviewed: Allergy & Precautions, NPO status , Patient's Chart, lab work & pertinent test results, reviewed documented beta blocker date and time   Airway Mallampati: III  TM Distance: >3 FB Neck ROM: Full    Dental  (+) Poor Dentition   Pulmonary shortness of breath and with exertion,    Pulmonary exam normal breath sounds clear to auscultation       Cardiovascular Exercise Tolerance: Poor hypertension, Pt. on medications and Pt. on home beta blockers +CHF  Normal cardiovascular exam+ dysrhythmias Supra Ventricular Tachycardia and Ventricular Tachycardia + Cardiac Defibrillator  Rhythm:Regular Rate:Normal  Normal coronaries at cath LVEF 37-85% Chronic systolic heart failure Dilated cardiomyopathy   Neuro/Psych negative neurological ROS  negative psych ROS   GI/Hepatic negative GI ROS, Neg liver ROS,   Endo/Other    Renal/GU Renal InsufficiencyRenal disease  negative genitourinary   Musculoskeletal negative musculoskeletal ROS (+)   Abdominal   Peds  Hematology  (+) anemia ,   Anesthesia Other Findings   Reproductive/Obstetrics Endometrial polyp Submucous myoma                           Anesthesia Physical Anesthesia Plan  ASA: III  Anesthesia Plan: Spinal   Post-op Pain Management:    Induction:   Airway Management Planned: Natural Airway  Additional Equipment:   Intra-op Plan:   Post-operative Plan:   Informed Consent: I have reviewed the patients History and Physical, chart, labs and discussed the procedure including the risks, benefits and alternatives for the proposed anesthesia with the patient or authorized representative who has indicated his/her understanding and acceptance.     Plan Discussed with: Anesthesiologist, CRNA and Surgeon  Anesthesia Plan Comments: (Per  cardiology no need to place magnet over AICD. Will restrict IVF during procedure and use phenylephrine for any drop in BP.)       Anesthesia Quick Evaluation

## 2015-10-05 NOTE — Transfer of Care (Signed)
Immediate Anesthesia Transfer of Care Note  Patient: Sydney Shaffer  Procedure(s) Performed: Procedure(s): DILATATION & CURETTAGE/HYSTEROSCOPY WITH VERSAPOINT RESECTION ([name])  Patient Location: PACU  Anesthesia Type:Spinal  Level of Consciousness: sedated  Airway & Oxygen Therapy: Patient Spontanous Breathing  Post-op Assessment: Report given to RN  Post vital signs: Reviewed and stable  Last Vitals:  Filed Vitals:   10/05/15 1251  BP:   Pulse:   Temp:   Resp: 18    Complications: No apparent anesthesia complications

## 2015-10-05 NOTE — H&P (Signed)
Sydney Shaffer is an 49 y.o. female  G3P3  RP:  [place] Resection, D+C for IU lesions  Pertinent Gynecological History: Menses: flow is excessive with use of many pads or tampons on heaviest days Bleeding: intermenstrual bleeding Contraception: tubal ligation Blood transfusions: none Sexually transmitted diseases: no past history Previous GYN Procedures: BT/S Last mammogram: normal Last pap: normal  OB History:  G3P3   Menstrual History:  No LMP recorded.    Past Medical History  Diagnosis Date  . CHF (congestive heart failure) ([place])     Dx 03/2012 - dilated cardiomyopathy with EF 20-25% by echo (abnl nuc but normal coronaries 04/02/12 per cath.   . Anemia     felt to be due to heavy menstrual flow  . Chronic systolic heart failure ([place])   . Hypertensive heart disease with CHF ([place])   . Hypertension   . Dysrhythmia     Bradycardia  . 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 Atrial tachycardia ([facility])     s/p ablation  . AVNRT (AV nodal re-entry tachycardia) [facility])     s/p ablation    Past Surgical History  Procedure Laterality Date  . Tubal ligation    . Appendectomy    . Cardiac catheterization  April 2013    normal coronaries  . Icd  08/13/2012  . Ep study and ablation  01/07/13    Ablation of AVNRT and atrial tachycardia (arising from the anteroseptal RA 68mm above the HIS)  . Left heart catheterization with coronary angiogram [name] 04/02/2012    Procedure: LEFT HEART CATHETERIZATION WITH CORONARY ANGIOGRAM;  Surgeon: Peter M Martinique, MD;  Location: [facility] CATH LAB;  Service: Cardiovascular;  Laterality: [name];  . Implantable cardioverter defibrillator implant [name] 08/13/2012    Procedure: IMPLANTABLE CARDIOVERTER DEFIBRILLATOR IMPLANT;  Surgeon: Deboraha Sprang, MD;  Location: [facility] CATH LAB;  Service: Cardiovascular;  Laterality: [name];  . Supraventricular tachycardia ablation [name] 01/07/2013    Procedure: SUPRAVENTRICULAR TACHYCARDIA ABLATION;  Surgeon: Thompson Grayer, MD;   Location: [facility] CATH LAB;  Service: Cardiovascular;  Laterality: [name];    Family History  Problem Relation Age of Onset  . Breast cancer Mother   . Cancer Father     Social History:  reports that she has never smoked. She has never used smokeless tobacco. She reports that she drinks about 0.6 oz of alcohol per week. She reports that she does not use illicit drugs.  Allergies: No Known Allergies  Prescriptions prior to admission  Medication Sig Dispense Refill Last Dose  . amiodarone (PACERONE) 200 MG tablet TAKE ONE TABLET BY MOUTH TWICE DAILY 90 tablet 0 10/05/2015 at Unknown time  . carvedilol (COREG) 6.25 MG tablet Take 1 tablet (6.25 mg total) by mouth 2 (two) times daily. 180 tablet 3 10/05/2015 at Unknown time  . Doxylamine Succinate, Sleep, (SLEEP AID PO) Take 1 tablet by mouth at bedtime as needed (sleep).    10/04/2015 at Unknown time  . ferrous sulfate 325 (65 FE) MG tablet Take 325 mg by mouth daily with breakfast.   10/04/2015 at Unknown time  . furosemide (LASIX) 40 MG tablet Take 1.5 tablets (60 mg total) by mouth 2 (two) times daily. 90 tablet 5 10/04/2015 at Unknown time  . lisinopril (PRINIVIL,ZESTRIL) 10 MG tablet Take 1 tab in AM and 2 tabs in PM 90 tablet 5 10/05/2015 at Unknown time  . Multiple Vitamin (MULTIVITAMIN WITH MINERALS) TABS Take 1 tablet by mouth daily.   10/04/2015 at Unknown time  .  Multiple Vitamins-Minerals (HAIR/SKIN/NAILS) TABS Take 1 tablet by mouth daily.   10/04/2015 at Unknown time  . pantoprazole (PROTONIX) 40 MG tablet Take 1 tablet (40 mg total) by mouth daily at 12 noon. 30 tablet 1 10/04/2015 at Unknown time  . potassium chloride SA (K-DUR,KLOR-CON) 20 MEQ tablet Take 2 tablets (40 mEq total) by mouth daily. 60 tablet 3 10/05/2015 at Unknown time  . spironolactone (ALDACTONE) 25 MG tablet Take 1 tablet (25 mg total) by mouth daily. 90 tablet 3 10/04/2015 at Unknown time    ROS  neg  Blood pressure 119/76, pulse 76, temperature 98.1 F (36.7  C), temperature source Oral, resp. rate 18, height 5\' 6"  (1.676 m), weight 201 lb (91.173 kg), SpO2 96 %. Physical Exam   Sonohysto:  Elongated 4.9 x 0.9 cm prob IU polyp and 1.2 cm prob IU polyp as well  Results for orders placed or performed during the hospital encounter of 10/05/15 (from the past 24 hour(s))  Pregnancy, urine     Status: None   Collection Time: 10/05/15 12:39 PM  Result Value Ref Range   Preg Test, Ur NEGATIVE NEGATIVE    Assessment/Plan: HSC resection, D+C for IU lesions.  Surgery and risks reviewed.  Cardio clearance obtained.  Sydney Shaffer,MARIE-LYNE 10/05/2015, 1:45 PM

## 2015-10-05 NOTE — Anesthesia Procedure Notes (Signed)
Spinal Patient location during procedure: OR Start time: 10/05/2015 2:10 PM Staffing Anesthesiologist: Josephine Igo Performed by: anesthesiologist  Preanesthetic Checklist Completed: patient identified, site marked, surgical consent, pre-op evaluation, timeout performed, IV checked, risks and benefits discussed and monitors and equipment checked Spinal Block Patient position: sitting Prep: site prepped and draped and DuraPrep Patient monitoring: heart rate, cardiac monitor, continuous pulse ox and blood pressure Approach: midline Location: L3-4 Injection technique: single-shot Needle Needle type: Sprotte  Needle gauge: 24 G Needle length: 9 cm Needle insertion depth: 6 cm Assessment Sensory level: T6 Additional Notes Patient tolerated procedure well. Adequate sensory level.

## 2015-10-05 NOTE — Progress Notes (Signed)
waiting on her ride home. Kristine Royal, RN

## 2015-10-05 NOTE — Anesthesia Postprocedure Evaluation (Signed)
  Anesthesia Post-op Note  Patient: Sydney Shaffer  Procedure(s) Performed: Procedure(s): DILATATION & CURETTAGE/HYSTEROSCOPY WITH VERSAPOINT RESECTION ([name])  Patient Location: PACU  Anesthesia Type:Spinal  Level of Consciousness: awake, alert  and oriented  Airway and Oxygen Therapy: Patient Spontanous Breathing  Post-op Pain: none  Post-op Assessment: Post-op Vital signs reviewed, Patient's Cardiovascular Status Stable, Respiratory Function Stable, Patent Airway, No signs of Nausea or vomiting, Pain level controlled, No headache, No backache and Spinal receding LLE Motor Response: No movement due to regional block LLE Sensation: No sensation (absent) RLE Motor Response: No movement due to regional block RLE Sensation: No sensation (absent)      Post-op Vital Signs: Reviewed and stable  Last Vitals:  Filed Vitals:   10/05/15 1630  BP: 109/69  Pulse: 52  Temp:   Resp: 15    Complications: No apparent anesthesia complications

## 2015-10-09 ENCOUNTER — Encounter (HOSPITAL_COMMUNITY): Payer: Self-pay | Admitting: Obstetrics & Gynecology

## 2015-10-10 ENCOUNTER — Other Ambulatory Visit (HOSPITAL_COMMUNITY): Payer: No Typology Code available for payment source

## 2015-10-23 ENCOUNTER — Encounter: Payer: Self-pay | Admitting: Cardiology

## 2015-10-24 ENCOUNTER — Encounter: Payer: No Typology Code available for payment source | Admitting: *Deleted

## 2015-10-24 ENCOUNTER — Telehealth: Payer: Self-pay | Admitting: Cardiology

## 2015-10-24 NOTE — Telephone Encounter (Signed)
LMOVM reminding pt to send remote transmission.   

## 2015-10-25 ENCOUNTER — Encounter: Payer: Self-pay | Admitting: Cardiology

## 2015-10-31 ENCOUNTER — Ambulatory Visit (INDEPENDENT_AMBULATORY_CARE_PROVIDER_SITE_OTHER): Payer: No Typology Code available for payment source | Admitting: *Deleted

## 2015-10-31 DIAGNOSIS — I5022 Chronic systolic (congestive) heart failure: Secondary | ICD-10-CM | POA: Diagnosis not present

## 2015-10-31 DIAGNOSIS — I429 Cardiomyopathy, unspecified: Secondary | ICD-10-CM

## 2015-10-31 DIAGNOSIS — I420 Dilated cardiomyopathy: Secondary | ICD-10-CM

## 2015-10-31 NOTE — Progress Notes (Signed)
Remote ICD transmission.   

## 2015-11-12 LAB — CUP PACEART REMOTE DEVICE CHECK
Brady Statistic RA Percent Paced: 0.03 %
Brady Statistic RV Percent Paced: 0.03 %
HighPow Impedance: 342 Ohm
HighPow Impedance: 75 Ohm
Implantable Lead Implant Date: 20130906
Implantable Lead Location: 753859
Implantable Lead Model: 5076
Implantable Lead Serial Number: 322962
Lead Channel Impedance Value: 361 Ohm
Lead Channel Pacing Threshold Pulse Width: 0.4 ms
Lead Channel Sensing Intrinsic Amplitude: 1.375 mV
Lead Channel Sensing Intrinsic Amplitude: 1.375 mV
Lead Channel Setting Pacing Amplitude: 2 V
MDC IDC LEAD IMPLANT DT: 20130906
MDC IDC LEAD LOCATION: 753860
MDC IDC LEAD MODEL: 181
MDC IDC MSMT BATTERY VOLTAGE: 3.1 V
MDC IDC MSMT LEADCHNL RA IMPEDANCE VALUE: 361 Ohm
MDC IDC MSMT LEADCHNL RA PACING THRESHOLD AMPLITUDE: 0.5 V
MDC IDC MSMT LEADCHNL RV PACING THRESHOLD AMPLITUDE: 1 V
MDC IDC MSMT LEADCHNL RV PACING THRESHOLD PULSEWIDTH: 0.4 ms
MDC IDC MSMT LEADCHNL RV SENSING INTR AMPL: 10.75 mV
MDC IDC MSMT LEADCHNL RV SENSING INTR AMPL: 10.75 mV
MDC IDC SESS DTM: 20161123053119
MDC IDC SET LEADCHNL RV PACING AMPLITUDE: 2.5 V
MDC IDC SET LEADCHNL RV PACING PULSEWIDTH: 0.4 ms
MDC IDC SET LEADCHNL RV SENSING SENSITIVITY: 0.3 mV
MDC IDC STAT BRADY AP VP PERCENT: 0 %
MDC IDC STAT BRADY AP VS PERCENT: 0.03 %
MDC IDC STAT BRADY AS VP PERCENT: 0.03 %
MDC IDC STAT BRADY AS VS PERCENT: 99.94 %

## 2015-11-13 ENCOUNTER — Encounter: Payer: Self-pay | Admitting: Cardiology

## 2015-12-05 ENCOUNTER — Encounter (HOSPITAL_COMMUNITY): Payer: No Typology Code available for payment source

## 2015-12-19 ENCOUNTER — Other Ambulatory Visit (HOSPITAL_COMMUNITY): Payer: Self-pay | Admitting: Internal Medicine

## 2015-12-19 ENCOUNTER — Other Ambulatory Visit (HOSPITAL_COMMUNITY): Payer: Self-pay | Admitting: Anesthesiology

## 2016-01-15 ENCOUNTER — Encounter (HOSPITAL_COMMUNITY): Payer: Self-pay | Admitting: Cardiology

## 2016-01-15 ENCOUNTER — Emergency Department (HOSPITAL_COMMUNITY): Payer: BLUE CROSS/BLUE SHIELD

## 2016-01-15 ENCOUNTER — Emergency Department (HOSPITAL_COMMUNITY)
Admission: EM | Admit: 2016-01-15 | Discharge: 2016-01-15 | Disposition: A | Discharge status: Home / Self Care | Payer: BLUE CROSS/BLUE SHIELD | Attending: Emergency Medicine | Admitting: Emergency Medicine

## 2016-01-15 DIAGNOSIS — I499 Cardiac arrhythmia, unspecified: Secondary | ICD-10-CM | POA: Diagnosis not present

## 2016-01-15 DIAGNOSIS — L02212 Cutaneous abscess of back [any part, except buttock]: Secondary | ICD-10-CM | POA: Insufficient documentation

## 2016-01-15 DIAGNOSIS — D649 Anemia, unspecified: Secondary | ICD-10-CM | POA: Diagnosis not present

## 2016-01-15 DIAGNOSIS — Z9889 Other specified postprocedural states: Secondary | ICD-10-CM | POA: Diagnosis not present

## 2016-01-15 DIAGNOSIS — I5022 Chronic systolic (congestive) heart failure: Secondary | ICD-10-CM | POA: Insufficient documentation

## 2016-01-15 DIAGNOSIS — I110 Hypertensive heart disease with heart failure: Secondary | ICD-10-CM | POA: Diagnosis not present

## 2016-01-15 DIAGNOSIS — Z79899 Other long term (current) drug therapy: Secondary | ICD-10-CM | POA: Diagnosis not present

## 2016-01-15 DIAGNOSIS — R079 Chest pain, unspecified: Secondary | ICD-10-CM | POA: Diagnosis present

## 2016-01-15 DIAGNOSIS — Z95810 Presence of automatic (implantable) cardiac defibrillator: Secondary | ICD-10-CM | POA: Insufficient documentation

## 2016-01-15 DIAGNOSIS — L0291 Cutaneous abscess, unspecified: Secondary | ICD-10-CM

## 2016-01-15 LAB — CBC
HCT: 36.8 % (ref 36.0–46.0)
HEMOGLOBIN: 11.9 g/dL — AB (ref 12.0–15.0)
MCH: 25.6 pg — ABNORMAL LOW (ref 26.0–34.0)
MCHC: 32.3 g/dL (ref 30.0–36.0)
MCV: 79.3 fL (ref 78.0–100.0)
Platelets: 311 10*3/uL (ref 150–400)
RBC: 4.64 MIL/uL (ref 3.87–5.11)
RDW: 15.7 % — ABNORMAL HIGH (ref 11.5–15.5)
WBC: 7.5 10*3/uL (ref 4.0–10.5)

## 2016-01-15 LAB — BASIC METABOLIC PANEL
ANION GAP: 13 (ref 5–15)
BUN: 22 mg/dL — ABNORMAL HIGH (ref 6–20)
CALCIUM: 10.1 mg/dL (ref 8.9–10.3)
CHLORIDE: 99 mmol/L — AB (ref 101–111)
CO2: 27 mmol/L (ref 22–32)
Creatinine, Ser: 1.12 mg/dL — ABNORMAL HIGH (ref 0.44–1.00)
GFR calc Af Amer: >60 mL/min (ref >60)
GFR calc non Af Amer: 56 mL/min — ABNORMAL LOW (ref >60)
GLUCOSE: 125 mg/dL — AB (ref 65–99)
POTASSIUM: 3.4 mmol/L — AB (ref 3.5–5.1)
Sodium: 139 mmol/L (ref 135–145)

## 2016-01-15 LAB — I-STAT TROPONIN, ED
TROPONIN I, POC: 0 ng/mL (ref 0.00–0.08)
Troponin i, poc: 0 ng/mL (ref 0.00–0.08)

## 2016-01-15 MED ORDER — SULFAMETHOXAZOLE-TRIMETHOPRIM 800-160 MG PO TABS: 1.0000 | ORAL_TABLET | Freq: Two times a day (BID) | ORAL | Status: AC

## 2016-01-15 MED ORDER — LIDOCAINE-EPINEPHRINE (PF) 2 %-1:200000 IJ SOLN
10.0000 mL | Freq: Once | INTRAMUSCULAR | Status: AC
  Administered 2016-01-15: 10 mL via INTRADERMAL
  Filled 2016-01-15: qty 10

## 2016-01-15 NOTE — ED Provider Notes (Signed)
CSN: [id number]     Arrival date & time 01/15/16  0932 History   First MD Initiated Contact with Patient 01/15/16 1120     Chief Complaint  Patient presents with  . Chest Pain  . Abscess   (Consider location/radiation/quality/duration/timing/severity/associated sxs/prior Treatment) HPI 50 y.o. female with a hx of CHF, Anemia, HTN, ICD, presents to the Emergency Department today complaining of worsening chest pain over the past week. States that this morning around 3AM her CP felt worse. Pain is central with radiation to the left arm. Pain is intermittent dull ache that is 7/10 pain. Her last visit to her Cardiologist was in October, but she has an upcoming appointment on the 19th of Feb. Currently no N/V/D. No fevers. No SOB/ABD pain. No headache/changes in vision. Pt also complains of a boil on her left upper back that she noticed x4 days ago.   Past Medical History  Diagnosis Date  . CHF (congestive heart failure) ([place])     Dx 03/2012 - dilated cardiomyopathy with EF 20-25% by echo (abnl nuc but normal coronaries 04/02/12 per cath.   . Anemia     felt to be due to heavy menstrual flow  . Chronic systolic heart failure ([place])   . Hypertensive heart disease with CHF ([place])   . Hypertension   . Dysrhythmia     Bradycardia  . 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 Atrial tachycardia (H[facility])     s/p ablation  . AVNRT (AV nodal re-entry tachycardia) [facility])     s/p ablation   Past Surgical History  Procedure Laterality Date  . Tubal ligation    . Appendectomy    . Cardiac catheterization  April 2013    normal coronaries  . Icd  08/13/2012  . Ep study and ablation  01/07/13    Ablation of AVNRT and atrial tachycardia (arising from the anteroseptal RA 22mm above the HIS)  . Left heart catheterization with coronary angiogram [name] 04/02/2012    Procedure: LEFT HEART CATHETERIZATION WITH CORONARY ANGIOGRAM;  Surgeon: Peter M Martinique, MD;  Location: [facility] CATH LAB;  Service:  Cardiovascular;  Laterality: [name];  . Implantable cardioverter defibrillator implant [name] 08/13/2012    Procedure: IMPLANTABLE CARDIOVERTER DEFIBRILLATOR IMPLANT;  Surgeon: Deboraha Sprang, MD;  Location: [facility] CATH LAB;  Service: Cardiovascular;  Laterality: [name];  . Supraventricular tachycardia ablation [name] 01/07/2013    Procedure: SUPRAVENTRICULAR TACHYCARDIA ABLATION;  Surgeon: Thompson Grayer, MD;  Location: [facility] CATH LAB;  Service: Cardiovascular;  Laterality: [name];  . Dilitation & currettage/hystroscopy with versapoint resection [name] 10/05/2015    Procedure: DILATATION & CURETTAGE/HYSTEROSCOPY WITH VERSAPOINT RESECTION;  Surgeon: [name], MD;  Location: [facility];  Service: Gynecology;  Laterality: [name];   Family History  Problem Relation Age of Onset  . Breast cancer Mother   . Cancer Father    Social History  Substance Use Topics  . Smoking status: Never Smoker   . Smokeless tobacco: Never Used  . Alcohol Use: 0.6 oz/week    1 Glasses of wine per week     Comment: daily   OB History    No data available     Review of Systems ROS reviewed and all are negative for acute change except as noted in the HPI.  Allergies  Review of patient's allergies indicates no known allergies.  Home Medications   Prior to Admission medications   Medication Sig Start Date End Date Taking? Authorizing Provider  amiodarone (PACERONE) 200 MG tablet TAKE ONE  TABLET BY MOUTH TWICE DAILY 12/20/15   Jolaine Artist, MD  carvedilol (COREG) 6.25 MG tablet Take 1 tablet (6.25 mg total) by mouth 2 (two) times daily. 10/03/15   Larey Dresser, MD  Doxylamine Succinate, Sleep, (SLEEP AID PO) Take 1 tablet by mouth at bedtime as needed (sleep).     Historical Provider, MD  ferrous sulfate 325 (65 FE) MG tablet Take 325 mg by mouth daily with breakfast.    Historical Provider, MD  furosemide (LASIX) 40 MG tablet Take 1.5 tablets (60 mg total) by mouth 2 (two) times daily. 02/13/15   Larey Dresser, MD  lisinopril  (PRINIVIL,ZESTRIL) 10 MG tablet Take 1 tab in AM and 2 tabs in PM 12/23/14   Liliane Shi, PA-C  Multiple Vitamin (MULTIVITAMIN WITH MINERALS) TABS Take 1 tablet by mouth daily.    Historical Provider, MD  Multiple Vitamins-Minerals (HAIR/SKIN/NAILS) TABS Take 1 tablet by mouth daily.    Historical Provider, MD  oxyCODONE-acetaminophen (PERCOCET) 7.5-325 MG tablet Take 1 tablet by mouth every 4 (four) hours as needed for severe pain. 10/05/15   [name], MD  pantoprazole (PROTONIX) 40 MG tablet Take 1 tablet (40 mg total) by mouth daily at 12 noon. 02/05/14   Barton Dubois, MD  potassium chloride SA (K-DUR,KLOR-CON) 20 MEQ tablet Take 2 tablets (40 mEq total) by mouth daily. 02/13/15   Larey Dresser, MD  spironolactone (ALDACTONE) 25 MG tablet Take 1 tablet (25 mg total) by mouth daily. 04/02/15   Shaune Pascal Bensimhon, MD   BP 127/85 mmHg  Pulse 76  Temp(Src) 98.2 F (36.8 C) (Oral)  Resp 15  Wt 91.173 kg  SpO2 94%   Physical Exam  Constitutional: She is oriented to person, place, and time. She appears well-developed and well-nourished.  HENT:  Head: Normocephalic and atraumatic.  Eyes: EOM are normal. Pupils are equal, round, and reactive to light.  Neck: Normal range of motion. Neck supple.  Cardiovascular: Normal rate, regular rhythm and normal heart sounds.   Defibrillator on L upper chest with surgical scar noted.   Pulmonary/Chest: Effort normal and breath sounds normal.  Abdominal: Soft. There is no tenderness.  Musculoskeletal: Normal range of motion.  Indurated nodule noted on left upper back at approx 5-6cm in diameter. No surrounding erythema. No red streaking. Painful on palpation.   Neurological: She is alert and oriented to person, place, and time.  Skin: Skin is warm and dry.  Psychiatric: She has a normal mood and affect. Her behavior is normal. Thought content normal.  Nursing note and vitals reviewed.  ED Course  .Marland KitchenIncision and Drainage Date/Time: 01/15/2016  2:20 PM Performed by: Shary Decamp Authorized by: Shary Decamp Consent: Verbal consent obtained. Risks and benefits: risks, benefits and alternatives were discussed Consent given by: patient and parent Patient understanding: patient states understanding of the procedure being performed Patient consent: the patient's understanding of the procedure matches consent given Patient identity confirmed: verbally with patient and arm band Time out: Immediately prior to procedure a "time out" was called to verify the correct patient, procedure, equipment, support staff and site/side marked as required. Type: abscess Body area: trunk Location details: back Local anesthetic: lidocaine 2% with epinephrine Anesthetic total: 2 ml Patient sedated: no Needle gauge: 22 Incision type: single straight Incision depth: dermal Complexity: simple Drainage: purulent,  serous and  bloody Drainage amount: copious Wound treatment: wound left open Packing material: none Patient tolerance: Patient tolerated the procedure well with no immediate complications  [place] bedside Date/Time: 01/15/2016 9:55 PM Performed by: Shary Decamp Authorized by: Shary Decamp Consent: Verbal consent obtained. Risks and benefits: risks, benefits and alternatives were discussed Consent given by: patient Patient understanding: patient states understanding of the procedure being performed Patient identity confirmed: verbally with patient and arm band Patient tolerance: Patient tolerated the procedure well with no immediate complications Comments: Ultrasound revealed abscess formation of left upper back.  2.5cm depth with width 3-4cm Images captured and recorded    (including critical care time) Labs Review Labs Reviewed  BASIC METABOLIC PANEL - Abnormal; Notable for the following:    Potassium 3.4 (*)    Chloride 99 (*)    Glucose, Bld 125 (*)    BUN 22 (*)    Creatinine, Ser 1.12 (*)    GFR calc non Af Amer 56 (*)    All other  components within normal limits  CBC - Abnormal; Notable for the following:    Hemoglobin 11.9 (*)    MCH 25.6 (*)    RDW 15.7 (*)    All other components within normal limits  I-STAT TROPOININ, ED  I-STAT TROPOININ, ED    Imaging Review Dg Chest 2 View  01/15/2016  CLINICAL DATA:  50 year old with indwelling pacing defibrillator, presenting with mid chest pain which began last week, lasted for 3 days and resolved, and recurrent last night. Current history of hypertension and chronic systolic heart failure. EXAM: CHEST  2 VIEW COMPARISON:  06/03/2015 and earlier, including CTA chest 02/04/2014. FINDINGS: Left subclavian pacing defibrillator unchanged and appears intact. Cardiac silhouette mildly enlarged, unchanged. Hilar and mediastinal contours otherwise unremarkable. Lungs clear. Bronchovascular markings normal. Pulmonary vascularity normal. No visible pleural effusions. No pneumothorax. Mild degenerative changes involving the thoracic spine. Noted significant interval change. IMPRESSION: Stable mild cardiomegaly.  No acute cardiopulmonary disease. Electronically Signed   By: Evangeline Dakin M.D.   On: 01/15/2016 10:45   I have personally reviewed and evaluated these images and lab results as part of my medical decision-making.   EKG Interpretation   Date/Time:  Tuesday January 15 2016 09:55:27 EST Ventricular Rate:  80 PR Interval:  162 QRS Duration: 110 QT Interval:  436 QTC Calculation: 502 R Axis:   -12 Text Interpretation:  Normal sinus rhythm Incomplete right bundle branch  block Nonspecific T wave abnormality Abnormal ECG No significant change  since last tracing Confirmed by [facility]  MD, MARTHA [phone]) on 01/15/2016  11:42:07 AM      MDM  I have reviewed relevant laboratory values. I have reviewed relevant imaging studies. I personally interpreted the relevant EKG. I have reviewed the relevant previous healthcare records. I obtained HPI from historian. Patient discussed  with supervising physician  ED Course:  Assessment: 49y F CHF, Anemia, HTN, ICD presents with worsening chest pain over the past week. Worse this morning with pain in the center of her chest and radiation to left arm. Delta Trop were negative. EKG Unremarkable. CXR unremarkable. Has implantable defibrillator that is functioning. On exam, VSS. Afebrile. NAD. Perc negative, no tracheal deviation, no JVD or new murmur, RRR, breath sounds equal bilaterally. There was a 3-4 cm abscess formation located on patient's left upper back with a depth of 2.5cm. I&D was performed with copious drainage. Given Bactrim Rx. Will have patient follow up with PCP for abscess formation and advised to follow up with Cardiologist on the 19th of February for chest pain. Patient is in no acute distress. Vital Signs are stable. Patient is able to ambulate.  Patient able to tolerate PO.   Disposition/Plan:  DC Home with ABX Additional Verbal discharge instructions given and discussed with patient.  Pt Instructed to f/u with PCP in the next 48 hours for evaluation and treatment of symptoms. Also advised to maintain scheduled appointment with Cardiologist.  Return precautions given Pt acknowledges and agrees with plan  Supervising Physician Alfonzo Beers, MD   Final diagnoses:  Chest pain, unspecified chest pain type  Abscess       Shary Decamp, PA-C 01/15/16 2203  Alfonzo Beers, MD 01/18/16 [id number]

## 2016-01-15 NOTE — ED Notes (Signed)
Pt ambulates to BR on arrival to room with steady gait.

## 2016-01-15 NOTE — ED Notes (Signed)
Pt verbalizes understanding of instructions. 

## 2016-01-15 NOTE — Discharge Instructions (Signed)
Please read and follow all provided instructions.  Your diagnoses today include:  1. Chest pain, unspecified chest pain type   2. Abscess     Tests performed today include:  An EKG of your heart  A chest x-ray  Cardiac enzymes - a blood test for heart muscle damage  Blood counts and electrolytes  Vital signs. See below for your results today.   Medications prescribed:   Take any prescribed medications only as directed.  Follow-up instructions: Please follow-up with your primary care provider as soon as you can for further evaluation of your symptoms.   Return instructions:  SEEK IMMEDIATE MEDICAL ATTENTION IF:  You have severe chest pain, especially if the pain is crushing or pressure-like and spreads to the arms, back, neck, or jaw, or if you have sweating, nausea (feeling sick to your stomach), or shortness of breath. THIS IS AN EMERGENCY. Don't wait to see if the pain will go away. Get medical help at once. Call 911 or 0 (operator). DO NOT drive yourself to the hospital.   Your chest pain gets worse and does not go away with rest.   You have an attack of chest pain lasting longer than usual, despite rest and treatment with the medications your caregiver has prescribed.   You wake from sleep with chest pain or shortness of breath.  You feel dizzy or faint.  You have chest pain not typical of your usual pain for which you originally saw your caregiver.   You have any other emergent concerns regarding your health.  Additional Information: Chest pain comes from many different causes. Your caregiver has diagnosed you as having chest pain that is not specific for one problem, but does not require admission.  You are at low risk for an acute heart condition or other serious illness.   Your vital signs today were: BP 124/71 mmHg   Pulse 71   Temp(Src) 98.2 F (36.8 C) (Oral)   Resp 17   Wt 91.173 kg   SpO2 98% If your blood pressure (BP) was elevated above 135/85 this  visit, please have this repeated by your doctor within one month. --------------

## 2016-01-15 NOTE — ED Notes (Signed)
Pt reports she had a short episode of chest pain this morning. Has a defib, present for about 5 years. Also reports a boil to her upper back.

## 2016-01-30 ENCOUNTER — Encounter: Payer: BLUE CROSS/BLUE SHIELD | Admitting: *Deleted

## 2016-01-30 ENCOUNTER — Telehealth: Payer: Self-pay | Admitting: Cardiology

## 2016-01-30 NOTE — Telephone Encounter (Signed)
LMOVM reminding pt to send remote transmission.   

## 2016-02-01 ENCOUNTER — Encounter: Payer: Self-pay | Admitting: Cardiology

## 2016-02-11 ENCOUNTER — Other Ambulatory Visit (HOSPITAL_COMMUNITY): Payer: Self-pay | Admitting: *Deleted

## 2016-02-11 ENCOUNTER — Other Ambulatory Visit: Payer: Self-pay

## 2016-02-11 DIAGNOSIS — I5022 Chronic systolic (congestive) heart failure: Secondary | ICD-10-CM

## 2016-02-11 MED ORDER — POTASSIUM CHLORIDE CRYS ER 20 MEQ PO TBCR: 40.0000 meq | EXTENDED_RELEASE_TABLET | Freq: Every day | ORAL | Status: DC

## 2016-02-12 ENCOUNTER — Other Ambulatory Visit (HOSPITAL_COMMUNITY): Payer: Self-pay | Admitting: *Deleted

## 2016-02-19 ENCOUNTER — Other Ambulatory Visit (HOSPITAL_COMMUNITY): Payer: Self-pay | Admitting: *Deleted

## 2016-02-19 DIAGNOSIS — I5022 Chronic systolic (congestive) heart failure: Secondary | ICD-10-CM

## 2016-02-19 MED ORDER — FUROSEMIDE 40 MG PO TABS: 60.0000 mg | ORAL_TABLET | Freq: Two times a day (BID) | ORAL | Status: DC

## 2016-04-07 ENCOUNTER — Ambulatory Visit (INDEPENDENT_AMBULATORY_CARE_PROVIDER_SITE_OTHER): Payer: BLUE CROSS/BLUE SHIELD | Admitting: *Deleted

## 2016-04-07 DIAGNOSIS — I5022 Chronic systolic (congestive) heart failure: Secondary | ICD-10-CM | POA: Diagnosis not present

## 2016-04-07 DIAGNOSIS — I429 Cardiomyopathy, unspecified: Secondary | ICD-10-CM

## 2016-04-07 DIAGNOSIS — I420 Dilated cardiomyopathy: Secondary | ICD-10-CM

## 2016-04-07 NOTE — Progress Notes (Signed)
Remote ICD transmission.   

## 2016-05-16 ENCOUNTER — Encounter: Payer: Self-pay | Admitting: Cardiology

## 2016-05-18 LAB — CUP PACEART REMOTE DEVICE CHECK
Battery Voltage: 3.07 V
Brady Statistic AP VS Percent: 0.03 %
Brady Statistic AS VP Percent: 0.03 %
Brady Statistic AS VS Percent: 99.94 %
HIGH POWER IMPEDANCE MEASURED VALUE: 71 Ohm
HighPow Impedance: 304 Ohm
Implantable Lead Implant Date: 20130906
Implantable Lead Location: 753860
Implantable Lead Model: 5076
Lead Channel Impedance Value: 399 Ohm
Lead Channel Pacing Threshold Amplitude: 0.5 V
Lead Channel Pacing Threshold Pulse Width: 0.4 ms
Lead Channel Sensing Intrinsic Amplitude: 11.625 mV
Lead Channel Setting Pacing Amplitude: 2 V
Lead Channel Setting Pacing Amplitude: 2.5 V
MDC IDC LEAD IMPLANT DT: 20130906
MDC IDC LEAD LOCATION: 753859
MDC IDC LEAD MODEL: 181
MDC IDC LEAD SERIAL: 322962
MDC IDC MSMT LEADCHNL RA PACING THRESHOLD PULSEWIDTH: 0.4 ms
MDC IDC MSMT LEADCHNL RA SENSING INTR AMPL: 1.25 mV
MDC IDC MSMT LEADCHNL RA SENSING INTR AMPL: 1.25 mV
MDC IDC MSMT LEADCHNL RV IMPEDANCE VALUE: 342 Ohm
MDC IDC MSMT LEADCHNL RV PACING THRESHOLD AMPLITUDE: 1 V
MDC IDC MSMT LEADCHNL RV SENSING INTR AMPL: 11.625 mV
MDC IDC SESS DTM: 20170501021055
MDC IDC SET LEADCHNL RV PACING PULSEWIDTH: 0.4 ms
MDC IDC SET LEADCHNL RV SENSING SENSITIVITY: 0.3 mV
MDC IDC STAT BRADY AP VP PERCENT: 0 %
MDC IDC STAT BRADY RA PERCENT PACED: 0.03 %
MDC IDC STAT BRADY RV PERCENT PACED: 0.03 %

## 2016-06-23 ENCOUNTER — Other Ambulatory Visit (HOSPITAL_COMMUNITY): Payer: Self-pay | Admitting: *Deleted

## 2016-06-23 MED ORDER — SPIRONOLACTONE 25 MG PO TABS: 25.0000 mg | ORAL_TABLET | Freq: Every day | ORAL | Status: DC

## 2016-09-03 ENCOUNTER — Encounter (HOSPITAL_COMMUNITY): Payer: Self-pay | Admitting: Internal Medicine

## 2016-09-03 ENCOUNTER — Encounter (HOSPITAL_COMMUNITY): Payer: Self-pay | Admitting: *Deleted

## 2016-09-03 ENCOUNTER — Ambulatory Visit (HOSPITAL_COMMUNITY)
Admission: RE | Admit: 2016-09-03 | Discharge: 2016-09-03 | Disposition: A | Discharge status: Home / Self Care | Payer: BLUE CROSS/BLUE SHIELD | Source: Ambulatory Visit | Attending: Internal Medicine | Admitting: Internal Medicine

## 2016-09-03 ENCOUNTER — Other Ambulatory Visit (HOSPITAL_COMMUNITY): Payer: Self-pay | Admitting: *Deleted

## 2016-09-03 VITALS — BP 120/82 | HR 84 | Wt 208.2 lb

## 2016-09-03 DIAGNOSIS — R079 Chest pain, unspecified: Secondary | ICD-10-CM

## 2016-09-03 DIAGNOSIS — I428 Other cardiomyopathies: Secondary | ICD-10-CM | POA: Insufficient documentation

## 2016-09-03 DIAGNOSIS — I10 Essential (primary) hypertension: Secondary | ICD-10-CM | POA: Insufficient documentation

## 2016-09-03 DIAGNOSIS — I5022 Chronic systolic (congestive) heart failure: Secondary | ICD-10-CM

## 2016-09-03 DIAGNOSIS — I420 Dilated cardiomyopathy: Secondary | ICD-10-CM

## 2016-09-03 DIAGNOSIS — Z95810 Presence of automatic (implantable) cardiac defibrillator: Secondary | ICD-10-CM | POA: Insufficient documentation

## 2016-09-03 DIAGNOSIS — R0600 Dyspnea, unspecified: Secondary | ICD-10-CM

## 2016-09-03 DIAGNOSIS — D649 Anemia, unspecified: Secondary | ICD-10-CM | POA: Insufficient documentation

## 2016-09-03 DIAGNOSIS — Z9889 Other specified postprocedural states: Secondary | ICD-10-CM | POA: Insufficient documentation

## 2016-09-03 DIAGNOSIS — I110 Hypertensive heart disease with heart failure: Secondary | ICD-10-CM | POA: Insufficient documentation

## 2016-09-03 DIAGNOSIS — I471 Supraventricular tachycardia: Secondary | ICD-10-CM | POA: Insufficient documentation

## 2016-09-03 LAB — COMPREHENSIVE METABOLIC PANEL
ALBUMIN: 3.9 g/dL (ref 3.5–5.0)
ALT: 22 U/L (ref 14–54)
AST: 25 U/L (ref 15–41)
Alkaline Phosphatase: 68 U/L (ref 38–126)
Anion gap: 10 (ref 5–15)
BUN: 16 mg/dL (ref 6–20)
CHLORIDE: 100 mmol/L — AB (ref 101–111)
CO2: 28 mmol/L (ref 22–32)
CREATININE: 1.16 mg/dL — AB (ref 0.44–1.00)
Calcium: 9.6 mg/dL (ref 8.9–10.3)
GFR calc Af Amer: >60 mL/min (ref >60)
GFR, EST NON AFRICAN AMERICAN: 54 mL/min — AB (ref >60)
GLUCOSE: 118 mg/dL — AB (ref 65–99)
Potassium: 2.9 mmol/L — ABNORMAL LOW (ref 3.5–5.1)
Sodium: 138 mmol/L (ref 135–145)
Total Bilirubin: 0.5 mg/dL (ref 0.3–1.2)
Total Protein: 7.7 g/dL (ref 6.5–8.1)

## 2016-09-03 LAB — CBC
HEMATOCRIT: 35.6 % — AB (ref 36.0–46.0)
Hemoglobin: 11.5 g/dL — ABNORMAL LOW (ref 12.0–15.0)
MCH: 26.4 pg (ref 26.0–34.0)
MCHC: 32.3 g/dL (ref 30.0–36.0)
MCV: 81.7 fL (ref 78.0–100.0)
PLATELETS: 323 10*3/uL (ref 150–400)
RBC: 4.36 MIL/uL (ref 3.87–5.11)
RDW: 15.8 % — ABNORMAL HIGH (ref 11.5–15.5)
WBC: 7.4 10*3/uL (ref 4.0–10.5)

## 2016-09-03 LAB — T4, FREE: FREE T4: 0.82 ng/dL (ref 0.61–1.12)

## 2016-09-03 LAB — TSH: TSH: 2.555 u[IU]/mL (ref 0.350–4.500)

## 2016-09-03 MED ORDER — SACUBITRIL-VALSARTAN 24-26 MG PO TABS: 1.0000 | ORAL_TABLET | Freq: Two times a day (BID) | ORAL | 3 refills | Status: DC

## 2016-09-03 NOTE — Patient Instructions (Signed)
Stop Lisinopril  Start Entresto 24/26 mg Twice daily   Labs today  Your physician has requested that you have an echocardiogram. Echocardiography is a painless test that uses sound waves to create images of your heart. It provides your doctor with information about the size and shape of your heart and how well your heart's chambers and valves are working. This procedure takes approximately one hour. There are no restrictions for this procedure.  Heart Catheterization 10/3, see instruction sheet  Your physician recommends that you schedule a follow-up appointment in: 3 months

## 2016-09-03 NOTE — Progress Notes (Signed)
ADVANCED HF CLINIC NOTE  Patient ID: Sydney Shaffer, female   DOB: Oct 16, 1966, 50 y.o.   MRN: [id number] EP: Dr Sydney Shaffer Medtronic ICD PCP: None   HPI: Sydney Shaffer is a 50 y.o. female with a history of nonischemic cardiomyopathy s/p Medtronic ICD, HTN, chronic systolic heart failure and SVT s/p 01/08/14 ablation.  Cath 4/13 normal coronaries    Admitted 02/03/14 with chest pain through 02/05/14. Troponin negative. She had been out of metoprolol. She was discharged on coreg 12.5 mg twice a day, lasix 40 mg twice a day, spiro 12.5 mg daily, and lisinopril 5 mg daily.  Discharge weight 195 pounds.    She has had trouble with anemia thought to be due to menorrhagia.  She was admitted in 6/16 with symptomatic anemia and transfused.   She returns for HF follow up. Continues to complain of severe fatigue. Says it is worse over the last 3 months. 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 Walking a lot with that. But hs to take a lot of breaks. CP with exertion several times er week. No orthopnea/PND.  On home scale weight stable at 191-198. Takes lasix 60 bid. Takes extra as needed. Denies snoring. Compliant with meds.   ICD checked today.  Fluid index up and down. For last 3 weeks fluid has been great. No VT/AF  Activity level 4hr/day   ECHO 07/05/12 EF 20-25%  ECHO 02/04/14 EF 20-25%  ECHO 6/16 EF 30-35% mild MR  CPX 06/03/14: FVC 1.69 (51%)  FEV1 1.24 (47%)  FEV1/FVC 74%  Peak VO2: 12.6 (57.8% predicted peak VO2) VE/VCO2 slope: 28.6 OUES: 1.60 Peak RER: 1.01 Peak VO2 adjusted to the patient's ideal body weight of 150 lb (67.8 kg) the peak VO2 is 16.5 ml/kg (ibw)/min (62% of the ibw-adjusted predicted).  Labs 12/04/14 K 3.3 Creatinine 1.13 Labs 11/08/14 K 3.1 Creatinine 0.8 TSH 1.99 Free T4 0.84  Labs 1/16 K 3.5, creatinine 0.92, HCT 26.4 Labs 02/13/2015 K 3.9 Creatinine 0.91  Labs 6/16 K 3.5, creatinine 0.79, HCT 30.3  ECG: NSR, iRBBB, nonspecific T wave flattening  SH: Works full time as a Production assistant, radio.  She is not a smoker. She does not drink alcohol . Lives alone. She has 3 grown children   FH: Mom breast cancer . Father cancer   ROS: All systems negative except as listed in HPI, PMH and Problem List.  Past Medical History:  Diagnosis Date  . Anemia    felt to be due to heavy menstrual flow  . Atrial tachycardia (HCC)    s/p ablation  . AVNRT (AV nodal re-entry tachycardia) ([place])    s/p ablation  . CHF (congestive heart failure) ([place])    Dx 03/2012 - dilated cardiomyopathy with EF 20-25% by echo (abnl nuc but normal coronaries 04/02/12 per cath.   . Chronic systolic heart failure ([place])   . Dysrhythmia    Bradycardia  . Hypertension   . Hypertensive heart disease with CHF ([place])   . ICD (implantable cardiac defibrillator) in place 08/13/2012    Current Outpatient Prescriptions  Medication Sig Dispense Refill  . amiodarone (PACERONE) 200 MG tablet TAKE ONE TABLET BY MOUTH TWICE DAILY 60 tablet 3  . carvedilol (COREG) 6.25 MG tablet Take 1 tablet (6.25 mg total) by mouth 2 (two) times daily. 180 tablet 3  . Doxylamine Succinate, Sleep, (SLEEP AID PO) Take 1 tablet by mouth at bedtime as needed (sleep).     . ferrous sulfate 325 (65 FE) MG tablet Take 325 mg by mouth  daily with breakfast.    . furosemide (LASIX) 40 MG tablet Take 1.5 tablets (60 mg total) by mouth 2 (two) times daily. 90 tablet 5  . lisinopril (PRINIVIL,ZESTRIL) 10 MG tablet Take 10 mg by mouth 2 (two) times daily.    . Multiple Vitamin (MULTIVITAMIN WITH MINERALS) TABS Take 1 tablet by mouth daily.    . pantoprazole (PROTONIX) 40 MG tablet Take 1 tablet (40 mg total) by mouth daily at 12 noon. 30 tablet 1  . potassium chloride SA (K-DUR,KLOR-CON) 20 MEQ tablet Take 2 tablets (40 mEq total) by mouth daily. 60 tablet 3  . spironolactone (ALDACTONE) 25 MG tablet Take 1 tablet (25 mg total) by mouth daily. 90 tablet 3  . oxyCODONE-acetaminophen (PERCOCET) 7.5-325 MG tablet Take 1 tablet by mouth every 4 (four) hours as  needed for severe pain. (Patient not taking: Reported on 09/03/2016) 30 tablet 0   No current facility-administered medications for this encounter.     Vitals:   09/03/16 1426  BP: 120/82  Pulse: 84  SpO2: 98%  Weight: 208 lb 4 oz (94.5 kg)   Filed Weights   09/03/16 1426  Weight: 208 lb 4 oz (94.5 kg)   PHYSICAL EXAM: General:  NAD. No resp difficulty . Ambulated in the clinic without difficulty.  HEENT: normal x poor dentition  Neck: supple. JVP does not appear elevated; Carotids 2+ bilaterally; no bruits. No lymphadenopathy or thryomegaly appreciated. Cor: PMI normal. Regular rate and rhythm. No rubs, gallops or murmurs. Lungs: clear Abdomen: soft, nontender, nondistended. No hepatosplenomegaly. No bruits or masses. Good bowel sounds. Extremities: no cyanosis, clubbing, rash, no edema Neuro: alert & orientedx3, cranial nerves grossly intact. Moves all 4 extremities w/o difficulty. Affect pleasant.   ASSESSMENT & PLAN:  1. Chronic Systolic Heart Failure: Normal coronaries by cath 2013. Nonischemic cardiomyopathy s/p Medtronic ICD. EF 30-35% (05/2015 echo).  NYHA class III-IIIB symptoms.  Not volume overloaded by exam or by Optivol.    - She has worsening symptoms accompanied by exertional CP. No overt HF on exam. We discussed options and have decided to proceed with R/L cath next week.  - Continue lasix to 60 mg bid and KCl to 40 daily.  BMET today.  - Continue lisinopril and spironolactone at current doses.  - Continue  Coreg 6.25 mg bid.                                                                                                                 - Reinforced the need and importance of daily weights, a low sodium diet, and fluid restriction (less than 2 L a day). Instructed to call the HF clinic if weight increases more than 3 lbs overnight or 5 lbs in a week.  I have reviewed the risks, indications, and alternatives to angioplasty and stenting with the patient. Risks include but  are not limited to bleeding, infection, vascular injury, stroke, myocardial infection, arrhythmia, kidney injury, radiation-related injury in the case of prolonged fluoroscopy use, emergency cardiac surgery, and death.  The patient understands the risks of serious complication is low ([id number]) and he agrees to proceed.   2. SVT: Had ablation for atrial tachycardia in 01/2014.  Continue amiodarone 200 mg twice a day per Dr Sydney Shaffer.  Check LFTs and TSH today.  She will need regular eye exams while on amiodarone.     Follow up in 2 months   Glori Bickers aMD 09/03/2016

## 2016-09-09 ENCOUNTER — Encounter: Payer: Self-pay | Admitting: Internal Medicine

## 2016-09-11 ENCOUNTER — Ambulatory Visit (HOSPITAL_COMMUNITY)
Admission: RE | Admit: 2016-09-11 | Discharge: 2016-09-11 | Disposition: A | Discharge status: Home / Self Care | Payer: Self-pay | Source: Ambulatory Visit | Attending: Internal Medicine | Admitting: Internal Medicine

## 2016-09-11 DIAGNOSIS — I420 Dilated cardiomyopathy: Secondary | ICD-10-CM | POA: Insufficient documentation

## 2016-09-12 ENCOUNTER — Encounter (HOSPITAL_COMMUNITY)
Admission: RE | Disposition: A | Discharge status: Home / Self Care | Payer: Self-pay | Source: Ambulatory Visit | Attending: Cardiology

## 2016-09-12 ENCOUNTER — Encounter (HOSPITAL_COMMUNITY): Payer: Self-pay | Admitting: *Deleted

## 2016-09-12 ENCOUNTER — Ambulatory Visit (HOSPITAL_COMMUNITY)
Admission: RE | Admit: 2016-09-12 | Discharge: 2016-09-12 | Disposition: A | Discharge status: Home / Self Care | Payer: Self-pay | Source: Ambulatory Visit | Attending: Cardiology | Admitting: Cardiology

## 2016-09-12 ENCOUNTER — Other Ambulatory Visit: Payer: Self-pay

## 2016-09-12 DIAGNOSIS — N920 Excessive and frequent menstruation with regular cycle: Secondary | ICD-10-CM | POA: Insufficient documentation

## 2016-09-12 DIAGNOSIS — I110 Hypertensive heart disease with heart failure: Secondary | ICD-10-CM | POA: Insufficient documentation

## 2016-09-12 DIAGNOSIS — D649 Anemia, unspecified: Secondary | ICD-10-CM | POA: Insufficient documentation

## 2016-09-12 DIAGNOSIS — I420 Dilated cardiomyopathy: Secondary | ICD-10-CM | POA: Insufficient documentation

## 2016-09-12 DIAGNOSIS — I5022 Chronic systolic (congestive) heart failure: Secondary | ICD-10-CM | POA: Insufficient documentation

## 2016-09-12 DIAGNOSIS — R072 Precordial pain: Secondary | ICD-10-CM | POA: Insufficient documentation

## 2016-09-12 DIAGNOSIS — Z95810 Presence of automatic (implantable) cardiac defibrillator: Secondary | ICD-10-CM | POA: Insufficient documentation

## 2016-09-12 DIAGNOSIS — Z803 Family history of malignant neoplasm of breast: Secondary | ICD-10-CM | POA: Insufficient documentation

## 2016-09-12 DIAGNOSIS — R079 Chest pain, unspecified: Secondary | ICD-10-CM

## 2016-09-12 DIAGNOSIS — I428 Other cardiomyopathies: Secondary | ICD-10-CM | POA: Insufficient documentation

## 2016-09-12 HISTORY — PX: CARDIAC CATHETERIZATION: SHX172

## 2016-09-12 LAB — BASIC METABOLIC PANEL
ANION GAP: 8 (ref 5–15)
BUN: 16 mg/dL (ref 6–20)
CO2: 25 mmol/L (ref 22–32)
Calcium: 9.3 mg/dL (ref 8.9–10.3)
Chloride: 104 mmol/L (ref 101–111)
Creatinine, Ser: 0.93 mg/dL (ref 0.44–1.00)
Glucose, Bld: 94 mg/dL (ref 65–99)
POTASSIUM: 3.2 mmol/L — AB (ref 3.5–5.1)
SODIUM: 137 mmol/L (ref 135–145)

## 2016-09-12 LAB — POCT I-STAT 3, VENOUS BLOOD GAS (G3P V)
Acid-base deficit: 3 mmol/L — ABNORMAL HIGH (ref 0.0–2.0)
Acid-base deficit: 3 mmol/L — ABNORMAL HIGH (ref 0.0–2.0)
Bicarbonate: 22.4 mmol/L (ref 20.0–28.0)
Bicarbonate: 22.8 mmol/L (ref 20.0–28.0)
O2 Saturation: 62 %
O2 Saturation: 63 %
PCO2 VEN: 40.4 mmHg — AB (ref 44.0–60.0)
PH VEN: 7.347 (ref 7.250–7.430)
PH VEN: 7.353 (ref 7.250–7.430)
TCO2: 24 mmol/L (ref 0–100)
TCO2: 24 mmol/L (ref 0–100)
pCO2, Ven: 41.6 mmHg — ABNORMAL LOW (ref 44.0–60.0)
pO2, Ven: 34 mmHg (ref 32.0–45.0)
pO2, Ven: 34 mmHg (ref 32.0–45.0)

## 2016-09-12 LAB — PROTIME-INR
INR: 0.98
Prothrombin Time: 13 seconds (ref 11.4–15.2)

## 2016-09-12 LAB — CBC
HEMATOCRIT: 34.2 % — AB (ref 36.0–46.0)
HEMOGLOBIN: 11 g/dL — AB (ref 12.0–15.0)
MCH: 26.1 pg (ref 26.0–34.0)
MCHC: 32.2 g/dL (ref 30.0–36.0)
MCV: 81.2 fL (ref 78.0–100.0)
Platelets: 318 10*3/uL (ref 150–400)
RBC: 4.21 MIL/uL (ref 3.87–5.11)
RDW: 16.6 % — AB (ref 11.5–15.5)
WBC: 5.4 10*3/uL (ref 4.0–10.5)

## 2016-09-12 LAB — POCT I-STAT 3, ART BLOOD GAS (G3+)
Acid-base deficit: 4 mmol/L — ABNORMAL HIGH (ref 0.0–2.0)
BICARBONATE: 20.5 mmol/L (ref 20.0–28.0)
O2 Saturation: 88 %
PH ART: 7.359 (ref 7.350–7.450)
TCO2: 22 mmol/L (ref 0–100)
pCO2 arterial: 36.4 mmHg (ref 32.0–48.0)
pO2, Arterial: 57 mmHg — ABNORMAL LOW (ref 83.0–108.0)

## 2016-09-12 LAB — POCT ACTIVATED CLOTTING TIME: ACTIVATED CLOTTING TIME: 180 s

## 2016-09-12 SURGERY — RIGHT/LEFT HEART CATH AND CORONARY ANGIOGRAPHY
Anesthesia: LOCAL

## 2016-09-12 MED ORDER — SODIUM CHLORIDE 0.9% FLUSH: 3.0000 mL | INTRAVENOUS | Status: DC | PRN

## 2016-09-12 MED ORDER — LIDOCAINE HCL (PF) 1 % IJ SOLN
INTRAMUSCULAR | Status: AC
  Filled 2016-09-12: qty 60

## 2016-09-12 MED ORDER — HEPARIN (PORCINE) IN NACL 2-0.9 UNIT/ML-% IJ SOLN
INTRAMUSCULAR | Status: AC
  Filled 2016-09-12: qty 500

## 2016-09-12 MED ORDER — SODIUM CHLORIDE 0.9 % IV SOLN: 250.0000 mL | INTRAVENOUS | Status: DC | PRN

## 2016-09-12 MED ORDER — FENTANYL CITRATE (PF) 100 MCG/2ML IJ SOLN
INTRAMUSCULAR | Status: DC | PRN
  Administered 2016-09-12: 25 ug via INTRAVENOUS

## 2016-09-12 MED ORDER — POTASSIUM CHLORIDE CRYS ER 20 MEQ PO TBCR
40.0000 meq | EXTENDED_RELEASE_TABLET | Freq: Once | ORAL | Status: AC
  Administered 2016-09-12: 40 meq via ORAL
  Filled 2016-09-12: qty 2

## 2016-09-12 MED ORDER — HEPARIN (PORCINE) IN NACL 2-0.9 UNIT/ML-% IJ SOLN
INTRAMUSCULAR | Status: DC | PRN
  Administered 2016-09-12: 1500 mL

## 2016-09-12 MED ORDER — ONDANSETRON HCL 4 MG/2ML IJ SOLN: 4.0000 mg | Freq: Four times a day (QID) | INTRAMUSCULAR | Status: DC | PRN

## 2016-09-12 MED ORDER — MIDAZOLAM HCL 2 MG/2ML IJ SOLN
INTRAMUSCULAR | Status: AC
  Filled 2016-09-12: qty 2

## 2016-09-12 MED ORDER — ASPIRIN 81 MG PO CHEW
CHEWABLE_TABLET | ORAL | Status: AC
  Filled 2016-09-12: qty 1

## 2016-09-12 MED ORDER — SODIUM CHLORIDE 0.9 % IV SOLN
INTRAVENOUS | Status: DC
  Administered 2016-09-12: 08:00:00 via INTRAVENOUS

## 2016-09-12 MED ORDER — FENTANYL CITRATE (PF) 100 MCG/2ML IJ SOLN
INTRAMUSCULAR | Status: AC
  Filled 2016-09-12: qty 2

## 2016-09-12 MED ORDER — SODIUM CHLORIDE 0.9% FLUSH: 3.0000 mL | Freq: Two times a day (BID) | INTRAVENOUS | Status: DC

## 2016-09-12 MED ORDER — LIDOCAINE HCL (PF) 1 % IJ SOLN
INTRAMUSCULAR | Status: DC | PRN
  Administered 2016-09-12 (×2): 2 mL via INTRADERMAL

## 2016-09-12 MED ORDER — POTASSIUM CHLORIDE CRYS ER 20 MEQ PO TBCR
EXTENDED_RELEASE_TABLET | ORAL | Status: AC
  Filled 2016-09-12: qty 2

## 2016-09-12 MED ORDER — HEPARIN (PORCINE) IN NACL 2-0.9 UNIT/ML-% IJ SOLN
INTRAMUSCULAR | Status: AC
  Filled 2016-09-12: qty 2000

## 2016-09-12 MED ORDER — MIDAZOLAM HCL 2 MG/2ML IJ SOLN
INTRAMUSCULAR | Status: DC | PRN
  Administered 2016-09-12: 1 mg via INTRAVENOUS
  Administered 2016-09-12: 2 mg via INTRAVENOUS

## 2016-09-12 MED ORDER — HEPARIN SODIUM (PORCINE) 1000 UNIT/ML IJ SOLN
INTRAMUSCULAR | Status: DC | PRN
  Administered 2016-09-12: 4000 [IU] via INTRAVENOUS

## 2016-09-12 MED ORDER — SODIUM CHLORIDE 0.9 % IV SOLN: INTRAVENOUS | Status: AC

## 2016-09-12 MED ORDER — VERAPAMIL HCL 2.5 MG/ML IV SOLN
INTRAVENOUS | Status: DC | PRN
  Administered 2016-09-12: 10 mL via INTRA_ARTERIAL

## 2016-09-12 MED ORDER — ASPIRIN 81 MG PO CHEW
81.0000 mg | CHEWABLE_TABLET | ORAL | Status: AC
  Administered 2016-09-12: 81 mg via ORAL

## 2016-09-12 MED ORDER — OXYCODONE-ACETAMINOPHEN 5-325 MG PO TABS: 1.0000 | ORAL_TABLET | ORAL | Status: DC | PRN

## 2016-09-12 MED ORDER — SODIUM CHLORIDE 0.9 % IV SOLN
250.0000 mL | INTRAVENOUS | Status: DC | PRN
Start: 2016-09-12 — End: 2016-09-12

## 2016-09-12 MED ORDER — HEPARIN SODIUM (PORCINE) 1000 UNIT/ML IJ SOLN
INTRAMUSCULAR | Status: AC
  Filled 2016-09-12: qty 1

## 2016-09-12 MED ORDER — IOPAMIDOL (ISOVUE-370) INJECTION 76%
INTRAVENOUS | Status: DC | PRN
  Administered 2016-09-12: 45 mL via INTRA_ARTERIAL

## 2016-09-12 MED ORDER — VERAPAMIL HCL 2.5 MG/ML IV SOLN
INTRAVENOUS | Status: AC
  Filled 2016-09-12: qty 2

## 2016-09-12 MED ORDER — IOPAMIDOL (ISOVUE-370) INJECTION 76%
INTRAVENOUS | Status: AC
  Filled 2016-09-12: qty 100

## 2016-09-12 MED ORDER — ACETAMINOPHEN 325 MG PO TABS
650.0000 mg | ORAL_TABLET | ORAL | Status: DC | PRN
Start: 2016-09-12 — End: 2016-09-12

## 2016-09-12 SURGICAL SUPPLY — 12 items
CATH 5FR JL3.5 JR4 ANG PIG MP (CATHETERS) ×1 IMPLANT
CATH BALLN WEDGE 5F 110CM (CATHETERS) ×1 IMPLANT
DEVICE RAD COMP TR BAND LRG (VASCULAR PRODUCTS) ×1 IMPLANT
GLIDESHEATH SLEND SS 6F .021 (SHEATH) ×1 IMPLANT
KIT HEART LEFT (KITS) ×2 IMPLANT
PACK CARDIAC CATHETERIZATION (CUSTOM PROCEDURE TRAY) ×2 IMPLANT
SHEATH FAST CATH BRACH 5F 5CM (SHEATH) ×1 IMPLANT
TRANSDUCER W/STOPCOCK (MISCELLANEOUS) ×3 IMPLANT
TUBING CIL FLEX 10 FLL-RA (TUBING) ×2 IMPLANT
WIRE EMERALD 3MM-J .025X260CM (WIRE) ×1 IMPLANT
WIRE HI TORQ VERSACORE-J 145CM (WIRE) ×1 IMPLANT
WIRE SAFE-T 1.5MM-J .035X260CM (WIRE) ×1 IMPLANT

## 2016-09-12 NOTE — H&P (View-Only) (Signed)
ADVANCED HF CLINIC NOTE  Patient ID: Sydney Shaffer, female   DOB: 07-Jul-1966, 50 y.o.   MRN: [id number] EP: Dr Caryl Comes Medtronic ICD PCP: None   HPI: Sydney Shaffer is a 50 y.o. female with a history of nonischemic cardiomyopathy s/p Medtronic ICD, HTN, chronic systolic heart failure and SVT s/p 01/08/14 ablation.  Cath 4/13 normal coronaries    Admitted 02/03/14 with chest pain through 02/05/14. Troponin negative. She had been out of metoprolol. She was discharged on coreg 12.5 mg twice a day, lasix 40 mg twice a day, spiro 12.5 mg daily, and lisinopril 5 mg daily.  Discharge weight 195 pounds.    She has had trouble with anemia thought to be due to menorrhagia.  She was admitted in 6/16 with symptomatic anemia and transfused.   She returns for HF follow up. Continues to complain of severe fatigue. Says it is worse over the last 3 months. 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 Walking a lot with that. But hs to take a lot of breaks. CP with exertion several times er week. No orthopnea/PND.  On home scale weight stable at 191-198. Takes lasix 60 bid. Takes extra as needed. Denies snoring. Compliant with meds.   ICD checked today.  Fluid index up and down. For last 3 weeks fluid has been great. No VT/AF  Activity level 4hr/day   ECHO 07/05/12 EF 20-25%  ECHO 02/04/14 EF 20-25%  ECHO 6/16 EF 30-35% mild MR  CPX 06/03/14: FVC 1.69 (51%)  FEV1 1.24 (47%)  FEV1/FVC 74%  Peak VO2: 12.6 (57.8% predicted peak VO2) VE/VCO2 slope: 28.6 OUES: 1.60 Peak RER: 1.01 Peak VO2 adjusted to the patient's ideal body weight of 150 lb (67.8 kg) the peak VO2 is 16.5 ml/kg (ibw)/min (62% of the ibw-adjusted predicted).  Labs 12/04/14 K 3.3 Creatinine 1.13 Labs 11/08/14 K 3.1 Creatinine 0.8 TSH 1.99 Free T4 0.84  Labs 1/16 K 3.5, creatinine 0.92, HCT 26.4 Labs 02/13/2015 K 3.9 Creatinine 0.91  Labs 6/16 K 3.5, creatinine 0.79, HCT 30.3  ECG: NSR, iRBBB, nonspecific T wave flattening  SH: Works full time as a Production assistant, radio.  She is not a smoker. She does not drink alcohol . Lives alone. She has 3 grown children   FH: Mom breast cancer . Father cancer   ROS: All systems negative except as listed in HPI, PMH and Problem List.  Past Medical History:  Diagnosis Date  . Anemia    felt to be due to heavy menstrual flow  . Atrial tachycardia (HCC)    s/p ablation  . AVNRT (AV nodal re-entry tachycardia) ([place])    s/p ablation  . CHF (congestive heart failure) ([place])    Dx 03/2012 - dilated cardiomyopathy with EF 20-25% by echo (abnl nuc but normal coronaries 04/02/12 per cath.   . Chronic systolic heart failure ([place])   . Dysrhythmia    Bradycardia  . Hypertension   . Hypertensive heart disease with CHF ([place])   . ICD (implantable cardiac defibrillator) in place 08/13/2012    Current Outpatient Prescriptions  Medication Sig Dispense Refill  . amiodarone (PACERONE) 200 MG tablet TAKE ONE TABLET BY MOUTH TWICE DAILY 60 tablet 3  . carvedilol (COREG) 6.25 MG tablet Take 1 tablet (6.25 mg total) by mouth 2 (two) times daily. 180 tablet 3  . Doxylamine Succinate, Sleep, (SLEEP AID PO) Take 1 tablet by mouth at bedtime as needed (sleep).     . ferrous sulfate 325 (65 FE) MG tablet Take 325 mg by mouth  daily with breakfast.    . furosemide (LASIX) 40 MG tablet Take 1.5 tablets (60 mg total) by mouth 2 (two) times daily. 90 tablet 5  . lisinopril (PRINIVIL,ZESTRIL) 10 MG tablet Take 10 mg by mouth 2 (two) times daily.    . Multiple Vitamin (MULTIVITAMIN WITH MINERALS) TABS Take 1 tablet by mouth daily.    . pantoprazole (PROTONIX) 40 MG tablet Take 1 tablet (40 mg total) by mouth daily at 12 noon. 30 tablet 1  . potassium chloride SA (K-DUR,KLOR-CON) 20 MEQ tablet Take 2 tablets (40 mEq total) by mouth daily. 60 tablet 3  . spironolactone (ALDACTONE) 25 MG tablet Take 1 tablet (25 mg total) by mouth daily. 90 tablet 3  . oxyCODONE-acetaminophen (PERCOCET) 7.5-325 MG tablet Take 1 tablet by mouth every 4 (four) hours as  needed for severe pain. (Patient not taking: Reported on 09/03/2016) 30 tablet 0   No current facility-administered medications for this encounter.     Vitals:   09/03/16 1426  BP: 120/82  Pulse: 84  SpO2: 98%  Weight: 208 lb 4 oz (94.5 kg)   Filed Weights   09/03/16 1426  Weight: 208 lb 4 oz (94.5 kg)   PHYSICAL EXAM: General:  NAD. No resp difficulty . Ambulated in the clinic without difficulty.  HEENT: normal x poor dentition  Neck: supple. JVP does not appear elevated; Carotids 2+ bilaterally; no bruits. No lymphadenopathy or thryomegaly appreciated. Cor: PMI normal. Regular rate and rhythm. No rubs, gallops or murmurs. Lungs: clear Abdomen: soft, nontender, nondistended. No hepatosplenomegaly. No bruits or masses. Good bowel sounds. Extremities: no cyanosis, clubbing, rash, no edema Neuro: alert & orientedx3, cranial nerves grossly intact. Moves all 4 extremities w/o difficulty. Affect pleasant.   ASSESSMENT & PLAN:  1. Chronic Systolic Heart Failure: Normal coronaries by cath 2013. Nonischemic cardiomyopathy s/p Medtronic ICD. EF 30-35% (05/2015 echo).  NYHA class III-IIIB symptoms.  Not volume overloaded by exam or by Optivol.    - She has worsening symptoms accompanied by exertional CP. No overt HF on exam. We discussed options and have decided to proceed with R/L cath next week.  - Continue lasix to 60 mg bid and KCl to 40 daily.  BMET today.  - Continue lisinopril and spironolactone at current doses.  - Continue  Coreg 6.25 mg bid.                                                                                                                 - Reinforced the need and importance of daily weights, a low sodium diet, and fluid restriction (less than 2 L a day). Instructed to call the HF clinic if weight increases more than 3 lbs overnight or 5 lbs in a week.  I have reviewed the risks, indications, and alternatives to angioplasty and stenting with the patient. Risks include but  are not limited to bleeding, infection, vascular injury, stroke, myocardial infection, arrhythmia, kidney injury, radiation-related injury in the case of prolonged fluoroscopy use, emergency cardiac surgery, and death.  The patient understands the risks of serious complication is low (123456) and he agrees to proceed.   2. SVT: Had ablation for atrial tachycardia in 01/2014.  Continue amiodarone 200 mg twice a day per Dr Caryl Comes.  Check LFTs and TSH today.  She will need regular eye exams while on amiodarone.     Follow up in 2 months   Glori Bickers aMD 09/03/2016

## 2016-09-12 NOTE — Discharge Instructions (Signed)
Radial Site Care °Refer to this sheet in the next few weeks. These instructions provide you with information about caring for yourself after your procedure. Your health care provider may also give you more specific instructions. Your treatment has been planned according to current medical practices, but problems sometimes occur. Call your health care provider if you have any problems or questions after your procedure. °WHAT TO EXPECT AFTER THE PROCEDURE °After your procedure, it is typical to have the following: °· Bruising at the radial site that usually fades within 1-2 weeks. °· Blood collecting in the tissue (hematoma) that may be painful to the touch. It should usually decrease in size and tenderness within 1-2 weeks. °HOME CARE INSTRUCTIONS °· Take medicines only as directed by your health care provider. °· You may shower 24-48 hours after the procedure or as directed by your health care provider. Remove the bandage (dressing) and gently wash the site with plain soap and water. Pat the area dry with a clean towel. Do not rub the site, because this may cause bleeding. °· Do not take baths, swim, or use a hot tub until your health care provider approves. °· Check your insertion site every day for redness, swelling, or drainage. °· Do not apply powder or lotion to the site. °· Do not flex or bend the affected arm for 24 hours or as directed by your health care provider. °· Do not push or pull heavy objects with the affected arm for 24 hours or as directed by your health care provider. °· Do not lift over 10 lb (4.5 kg) for 5 days after your procedure or as directed by your health care provider. °· Ask your health care provider when it is okay to: °¨ Return to work or school. °¨ Resume usual physical activities or sports. °¨ Resume sexual activity. °· Do not drive home if you are discharged the same day as the procedure. Have someone else drive you. °· You may drive 24 hours after the procedure unless otherwise  instructed by your health care provider. °· Do not operate machinery or power tools for 24 hours after the procedure. °· If your procedure was done as an outpatient procedure, which means that you went home the same day as your procedure, a responsible adult should be with you for the first 24 hours after you arrive home. °· Keep all follow-up visits as directed by your health care provider. This is important. °SEEK MEDICAL CARE IF: °· You have a fever. °· You have chills. °· You have increased bleeding from the radial site. Hold pressure on the site. °SEEK IMMEDIATE MEDICAL CARE IF: °· You have unusual pain at the radial site. °· You have redness, warmth, or swelling at the radial site. °· You have drainage (other than a small amount of blood on the dressing) from the radial site. °· The radial site is bleeding, and the bleeding does not stop after 30 minutes of holding steady pressure on the site. °· Your arm or hand becomes pale, cool, tingly, or numb. °  °This information is not intended to replace advice given to you by your health care provider. Make sure you discuss any questions you have with your health care provider. °  °Document Released: 12/27/2010 Document Revised: 12/15/2014 Document Reviewed: 06/12/2014 °Elsevier Interactive Patient Education ©2016 [employer] Inc. ° °

## 2016-09-12 NOTE — Interval H&P Note (Signed)
History and Physical Interval Note:  09/12/2016 9:45 AM  Sydney Shaffer  has presented today for surgery, with the diagnosis of chf  The various methods of treatment have been discussed with the patient and family. After consideration of risks, benefits and other options for treatment, the patient has consented to  Procedure(s): Right/Left Heart Cath and Coronary Angiography ([name]) and possible coronary angioplasty as a surgical intervention .  The patient's history has been reviewed, patient examined, no change in status, stable for surgery.  I have reviewed the patient's chart and labs.  Questions were answered to the patient's satisfaction.    Cath Lab Visit (complete for each Cath Lab visit)  Clinical Evaluation Leading to the Procedure:   ACS: No.  Non-ACS:    Anginal Classification: CCS III  Anti-ischemic medical therapy: Minimal Therapy (1 class of medications)  Non-Invasive Test Results: No non-invasive testing performed  Prior CABG: No previous CABG        Fadia Marlar, Quillian Quince

## 2016-09-12 NOTE — Progress Notes (Signed)
Echocardiogram 2D Echocardiogram has been performed.  Sydney Shaffer 09/12/2016, 7:33 AM

## 2016-09-12 NOTE — Progress Notes (Signed)
Pt ambulated in hall, standby assist and voided in bathroom without difficulty. Denied dizziness, pain or shortness of breath. Right radial area remains Level 0. Pt getting dressed with assistance in preparation for DC home.

## 2016-10-16 ENCOUNTER — Other Ambulatory Visit (HOSPITAL_COMMUNITY): Payer: Self-pay | Admitting: *Deleted

## 2016-10-16 MED ORDER — CARVEDILOL 6.25 MG PO TABS: 6.2500 mg | ORAL_TABLET | Freq: Two times a day (BID) | ORAL | 3 refills | Status: DC

## 2016-11-11 ENCOUNTER — Encounter (HOSPITAL_COMMUNITY): Payer: Self-pay

## 2016-11-11 NOTE — Progress Notes (Signed)
Medical record request received from [facility]  via mail dated 10/15/2016 requesting records 12/09/2015--present. All available records from CHF clinic faxed to provided # [phone] (40 pages total). Copy of request scanned into patient's electronic medical record. Case [id number]  [name], RN

## 2016-11-19 ENCOUNTER — Encounter: Payer: Self-pay | Admitting: Internal Medicine

## 2016-11-26 ENCOUNTER — Encounter: Payer: BLUE CROSS/BLUE SHIELD | Admitting: Internal Medicine

## 2016-12-03 ENCOUNTER — Ambulatory Visit (HOSPITAL_COMMUNITY)
Admission: RE | Admit: 2016-12-03 | Discharge: 2016-12-03 | Disposition: A | Discharge status: Home / Self Care | Payer: Self-pay | Source: Ambulatory Visit | Attending: Internal Medicine | Admitting: Internal Medicine

## 2016-12-03 ENCOUNTER — Encounter: Payer: Self-pay | Admitting: Internal Medicine

## 2016-12-03 ENCOUNTER — Encounter (HOSPITAL_COMMUNITY): Payer: Self-pay | Admitting: Internal Medicine

## 2016-12-03 VITALS — BP 124/76 | HR 72 | Wt 211.8 lb

## 2016-12-03 DIAGNOSIS — Z9889 Other specified postprocedural states: Secondary | ICD-10-CM | POA: Insufficient documentation

## 2016-12-03 DIAGNOSIS — I5022 Chronic systolic (congestive) heart failure: Secondary | ICD-10-CM | POA: Insufficient documentation

## 2016-12-03 DIAGNOSIS — I471 Supraventricular tachycardia: Secondary | ICD-10-CM | POA: Insufficient documentation

## 2016-12-03 DIAGNOSIS — I429 Cardiomyopathy, unspecified: Secondary | ICD-10-CM | POA: Insufficient documentation

## 2016-12-03 DIAGNOSIS — Z79899 Other long term (current) drug therapy: Secondary | ICD-10-CM | POA: Insufficient documentation

## 2016-12-03 DIAGNOSIS — I420 Dilated cardiomyopathy: Secondary | ICD-10-CM | POA: Insufficient documentation

## 2016-12-03 DIAGNOSIS — I110 Hypertensive heart disease with heart failure: Secondary | ICD-10-CM | POA: Insufficient documentation

## 2016-12-03 DIAGNOSIS — Z95810 Presence of automatic (implantable) cardiac defibrillator: Secondary | ICD-10-CM | POA: Insufficient documentation

## 2016-12-03 DIAGNOSIS — D649 Anemia, unspecified: Secondary | ICD-10-CM | POA: Insufficient documentation

## 2016-12-03 DIAGNOSIS — Z803 Family history of malignant neoplasm of breast: Secondary | ICD-10-CM | POA: Insufficient documentation

## 2016-12-03 LAB — BASIC METABOLIC PANEL
ANION GAP: 7 (ref 5–15)
BUN: 16 mg/dL (ref 6–20)
CALCIUM: 9.9 mg/dL (ref 8.9–10.3)
CO2: 26 mmol/L (ref 22–32)
Chloride: 107 mmol/L (ref 101–111)
Creatinine, Ser: 0.86 mg/dL (ref 0.44–1.00)
GFR calc Af Amer: >60 mL/min (ref >60)
GFR calc non Af Amer: >60 mL/min (ref >60)
Glucose, Bld: 96 mg/dL (ref 65–99)
Potassium: 4.1 mmol/L (ref 3.5–5.1)
Sodium: 140 mmol/L (ref 135–145)

## 2016-12-03 LAB — BRAIN NATRIURETIC PEPTIDE: B Natriuretic Peptide: 122.4 pg/mL — ABNORMAL HIGH (ref 0.0–100.0)

## 2016-12-03 MED ORDER — SACUBITRIL-VALSARTAN 49-51 MG PO TABS: 1.0000 | ORAL_TABLET | Freq: Two times a day (BID) | ORAL | 6 refills | Status: DC

## 2016-12-03 NOTE — Progress Notes (Signed)
    ReDS Vest - 12/03/16 1500      ReDS Vest   MR  No   Estimated volume prior to reading Med   Fitting Posture Standing   Height Marker Tall   Ruler Value 12   Center Strip Aligned   ReDS Value 35

## 2016-12-03 NOTE — Patient Instructions (Signed)
INCREASE Entresto to 49/51 mg, one tab twice a day  Labs today We will only contact you if something comes back abnormal or we need to make some changes. Otherwise no news is good news!  Labs needed in 10 -14 days  Your physician recommends that you schedule a follow-up appointment in: 3-4 weeks with Sydney Shaffer, PharmD and 3 months with Dr Sydney Laws  Do the following things EVERYDAY: 1) Weigh yourself in the morning before breakfast. Write it down and keep it in a log. 2) Take your medicines as prescribed 3) Eat low salt foods-Limit salt (sodium) to 2000 mg per day.  4) Stay as active as you can everyday 5) Limit all fluids for the day to less than 2 liters

## 2016-12-03 NOTE — Progress Notes (Signed)
Advanced Heart Failure Clinic Note   Patient ID: Sydney Shaffer, female   DOB: Aug 05, 1966, 50 y.o.   MRN: [id number] EP: Dr Caryl Comes Medtronic ICD PCP: None   HPI: Sydney Shaffer is a 50 y.o. female with a history of nonischemic cardiomyopathy s/p Medtronic ICD, HTN, chronic systolic heart failure and SVT s/p 01/08/14 ablation.  Cath 4/13 normal coronaries    Admitted 02/03/14 with chest pain through 02/05/14. Troponin negative. She had been out of metoprolol. She was discharged on coreg 12.5 mg twice a day, lasix 40 mg twice a day, spiro 12.5 mg daily, and lisinopril 5 mg daily.  Discharge weight 195 pounds.    She has had trouble with anemia thought to be due to menorrhagia.  She was admitted in 6/16 with symptomatic anemia and transfused.   She returns today for regular follow up. States her fatigue is better than last time. Overall feels OK. 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 Takes breaks as needed. At pace, can make it through the whole day.  Has to stop if she gets in a hurry or climbs steps.  Sleeps on 2 pillows chronically. + orthopnea. Denies CP. Weight at home 204-206 lbs. Has been as low 197 in past few months. Taking lasix 60 mg BID. Took extra in October, hasn't since.    Optivol today: Fluid index elevated since end of September. Pt activity 4 hr/day.  No VT. Occasional SVT.     ECHO 07/05/12 EF 20-25%  ECHO 02/04/14 EF 20-25%  ECHO 6/16 EF 30-35% mild MR ECHO 09/2016 EF 25-30%, Grade 1 DD  RHC 09/12/16 RA = 3 RV = 23/4 PA = 22/6 (14) PCW = 8 Ao = 123/69 (91) LV = 106/6 Fick cardiac output/index = 7.4/3.5 PVR = < 1.0 WU SVR = 958  FA sat = 88% PA sat = 63%, 62%   CPX 06/03/14: FVC 1.69 (51%)  FEV1 1.24 (47%)  FEV1/FVC 74%  Peak VO2: 12.6 (57.8% predicted peak VO2) VE/VCO2 slope: 28.6 OUES: 1.60 Peak RER: 1.01 Peak VO2 adjusted to the patient's ideal body weight of 150 lb (67.8 kg) the peak VO2 is 16.5 ml/kg (ibw)/min (62% of the ibw-adjusted predicted).  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 She does not drink alcohol . Lives alone. She has 3 grown children   FH: Mom breast cancer . Father cancer   ROS: All systems negative except as listed in HPI, PMH and Problem List.  Past Medical History:  Diagnosis Date  . Anemia    felt to be due to heavy menstrual flow  . Atrial tachycardia (HCC)    s/p ablation  . AVNRT (AV nodal re-entry tachycardia) ([place])    s/p ablation  . CHF (congestive heart failure) ([place])    Dx 03/2012 - dilated cardiomyopathy with EF 20-25% by echo (abnl nuc but normal coronaries 04/02/12 per cath.   . Chronic systolic heart failure ([place])   . Dysrhythmia    Bradycardia  . Hypertension   . Hypertensive heart disease with CHF ([place])   . ICD (implantable cardiac defibrillator) in place 08/13/2012    Current Outpatient Prescriptions  Medication Sig Dispense Refill  . amiodarone (PACERONE) 200 MG  tablet TAKE ONE TABLET BY MOUTH TWICE DAILY 60 tablet 3  . carvedilol (COREG) 6.25 MG tablet Take 1 tablet (6.25 mg total) by mouth 2 (two) times daily. 180 tablet 3  . Doxylamine Succinate, Sleep, (SLEEP AID PO) Take 1 tablet by mouth at bedtime as needed (sleep).     . ferrous sulfate 325 (65 FE) MG tablet Take 325 mg by mouth daily with breakfast.    . furosemide (LASIX) 40 MG tablet Take 1.5 tablets (60 mg total) by mouth 2 (two) times daily. 90 tablet 5  . Multiple Vitamin (MULTIVITAMIN WITH MINERALS) TABS Take 1 tablet by mouth daily.    . pantoprazole (PROTONIX) 40 MG tablet Take 1 tablet (40 mg total) by mouth daily at 12 noon. 30 tablet 1  . potassium chloride SA (K-DUR,KLOR-CON) 20 MEQ tablet Take 20 mEq by mouth 2 (two) times daily.    .  sacubitril-valsartan (ENTRESTO) 24-26 MG Take 1 tablet by mouth 2 (two) times daily. 60 tablet 3  . spironolactone (ALDACTONE) 25 MG tablet Take 1 tablet (25 mg total) by mouth daily. 90 tablet 3   No current facility-administered medications for this encounter.     Vitals:   12/03/16 1442  BP: 124/76  Pulse: 72  SpO2: 98%  Weight: 211 lb 12 oz (96 kg)   Wt Readings from Last 3 Encounters:  12/03/16 211 lb 12 oz (96 kg)  09/12/16 205 lb (93 kg)  09/03/16 208 lb 4 oz (94.5 kg)     PHYSICAL EXAM: General:  Well appearing. NAD.   HEENT: Normal except for poor dentition.   Neck: supple. JVP ~7-8 cm. Carotids 2+ bilaterally; no bruits. No thyromegaly or nodule noted.  Cor: PMI normal. RRR. No M/G/R noted.  Lungs: CTAB, normal effort Abdomen: soft, NT, ND, no HSM. No bruits or masses. +BS  Extremities: no cyanosis, clubbing, rash, warm + trace edema.  Neuro: alert & orientedx3, cranial nerves grossly intact. Moves all 4 extremities w/o difficulty. Affect pleasant.   ASSESSMENT & PLAN:  1. Chronic Systolic Heart Failure: Normal coronaries by cath 2013. Nonischemic cardiomyopathy s/p Medtronic ICD. EF 30-35% (05/2015 echo). Ech 10/17 25-30%  NYHA class II symptoms.  - Volume elevated by optivol, but appears only mildly so at most on exam. ReDs Vest 35%. Will continue current lasix as below, and increase entresto  - R/LHC with preserved cardiac output and stable volume status.  - Continue lasix 60 mg bid and KCl 40 daily.  BMET today.  - Increase entresto to 49/51 mg BID.  BMET/BNP today and repeat BMET 10 days.  - Continue spironolactone 25 mg daily.   - Continue  Coreg 6.25 mg bid.                                                                                                                 -  2. SVT: s/p ablation for atrial tachycardia in 01/2014.  Several very short episodes on Optivol ( Less than 10 episodes, lasting 1-2 minutes) - Continue  amiodarone 200 mg BID. Sees Dr. Caryl Comes.    - Needs regular eye exams.    Labs today. Increase Entresto 3-4 weeks with Pharm-D. Will work on [employer].   Follow up 3 months   Shirley Friar, PA-C 12/03/2016  Patient seen and examined with Oda Kilts, PA-C. We discussed all aspects of the encounter. I agree with the assessment and plan as stated above.   Overall doing well. NYHA II. Volume status looks decent on exam but Optivol up. ReDS vest 35% (upper end of normal). Agree with increasing Entresto. Would like to decrease amio to 200 daily. Will ask Dr. Caryl Comes for his input. See back in 4 weeks in PharmD Clinic to help with med titration and resources.   Giselle Brutus,MD 3:47 PM

## 2016-12-17 ENCOUNTER — Ambulatory Visit (HOSPITAL_COMMUNITY)
Admission: RE | Admit: 2016-12-17 | Discharge: 2016-12-17 | Disposition: A | Discharge status: Home / Self Care | Payer: Self-pay | Source: Ambulatory Visit | Attending: Internal Medicine | Admitting: Internal Medicine

## 2016-12-17 DIAGNOSIS — I420 Dilated cardiomyopathy: Secondary | ICD-10-CM

## 2016-12-17 DIAGNOSIS — I429 Cardiomyopathy, unspecified: Secondary | ICD-10-CM | POA: Insufficient documentation

## 2016-12-17 LAB — BASIC METABOLIC PANEL
Anion gap: 4 — ABNORMAL LOW (ref 5–15)
BUN: 14 mg/dL (ref 6–20)
CALCIUM: 9.7 mg/dL (ref 8.9–10.3)
CO2: 28 mmol/L (ref 22–32)
CREATININE: 0.82 mg/dL (ref 0.44–1.00)
Chloride: 108 mmol/L (ref 101–111)
GFR calc Af Amer: >60 mL/min (ref >60)
GFR calc non Af Amer: >60 mL/min (ref >60)
Glucose, Bld: 94 mg/dL (ref 65–99)
Potassium: 3.8 mmol/L (ref 3.5–5.1)
SODIUM: 140 mmol/L (ref 135–145)

## 2016-12-22 ENCOUNTER — Encounter: Payer: Self-pay | Admitting: *Deleted

## 2016-12-31 ENCOUNTER — Ambulatory Visit (HOSPITAL_COMMUNITY)
Admission: RE | Admit: 2016-12-31 | Discharge: 2016-12-31 | Disposition: A | Discharge status: Home / Self Care | Payer: Self-pay | Source: Ambulatory Visit | Attending: Cardiology | Admitting: Cardiology

## 2016-12-31 DIAGNOSIS — I5022 Chronic systolic (congestive) heart failure: Secondary | ICD-10-CM | POA: Insufficient documentation

## 2016-12-31 MED ORDER — POTASSIUM CHLORIDE CRYS ER 20 MEQ PO TBCR: 20.0000 meq | EXTENDED_RELEASE_TABLET | Freq: Two times a day (BID) | ORAL | 5 refills | Status: DC

## 2016-12-31 MED ORDER — SPIRONOLACTONE 25 MG PO TABS: 25.0000 mg | ORAL_TABLET | Freq: Every day | ORAL | 5 refills | Status: DC

## 2016-12-31 MED ORDER — PANTOPRAZOLE SODIUM 40 MG PO TBEC: 40.0000 mg | DELAYED_RELEASE_TABLET | Freq: Every day | ORAL | 1 refills | Status: DC

## 2016-12-31 MED FILL — KLOR-CON M20 TABLET: 20 | 30 days supply | Qty: 60 | Fill #0

## 2016-12-31 MED FILL — SPIRONOLACTONE 25 MG TABLET: 25 | 30 days supply | Qty: 30 | Fill #0

## 2016-12-31 NOTE — Patient Instructions (Signed)
It was great to see you today!  Please RESTART spironolactone 25 mg (1 tablet) EVERY EVENING.   You are scheduled for a lab work on Wednesday January 31st at 3:00 PM.   Please keep your appointment with Dr. Haroldine Laws on 03/03/17.

## 2016-12-31 NOTE — Progress Notes (Signed)
HF MD: Haroldine Laws  HPI:  Sydney Shaffer is a 51 y.o. AA female with a history of nonischemic cardiomyopathy s/p Medtronic ICD, HTN, chronic systolic heart failure and SVT s/p 01/08/14 ablation.  Cath 4/13 normal coronaries.    Admitted 02/03/14-02/05/14 with chest pain. Troponin negative. She had been out of metoprolol. She was discharged on coreg 12.5 mg twice a day, lasix 40 mg twice a day, spiro 12.5 mg daily, and lisinopril 5 mg daily.  Discharge weight 195 pounds.    She has had trouble with anemia thought to be due to menorrhagia.  She was admitted in 6/16 with symptomatic anemia and transfused.   She returns today for pharmacist-led HF medication titration. She looks fatigued today and states that her fatigue is about the same as it has been over the last few weeks. 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 She can walk on flat ground without getting SOB but usually has to take breaks if going up hill or climbing steps. Sleeps on 2 pillows chronically.     . Shortness of breath/dyspnea on exertion? no  . Orthopnea/PND? No - stable on 2 pillows . Edema? no . Lightheadedness/dizziness? Yes - more in am upon waking (stable) . Daily weights at home? Yes - ~208-210 lbs . Blood pressure/heart rate monitoring at home? no . Following low-sodium/fluid-restricted diet? Yes - stays away from red meats, vegetables; trying to maintain <2L fluid intake daily  HF Medications: Carvedilol 6.25 mg PO BID Furosemide 60 mg PO BID KCl 20 mEq PO BID Entresto 49-51 mg PO BID Spironolactone 25 mg PO daily - out x 3 days  Has the patient been experiencing any side effects to the medications prescribed?  no  Does the patient have any problems obtaining medications due to transportation or finances?   Yes - no Rx insurance, denied Medicaid -- will refer to Kennyth Lose ([place]) to see if she can assist with re-applying for Medicaid *Enrolling in [employer] patient assistance for [employer]  of regimen: good Understanding of indications: good Potential of compliance: fair Patient understands to avoid NSAIDs. Patient understands to avoid decongestants.    Pertinent Lab Values: . 12/17/16: Serum creatinine 0.82, BUN 14, Potassium 3.8, Sodium 140, BNP 122.4  Vital Signs: . Weight: 208.6 lb (dry weight: 205-210 lb) . Blood pressure: 128/82 mmHg  . Heart rate: 80 bpm    Optivol today: fluid index much improved and now below threshold, pt activity ~ 4 hr/day.  Assessment: 1. Chronicsystolic CHF (EF 123XX123), due to NICM. Medtronic ICD with Optivol s/p SVT ablation 01/08/14. NYHA class IIsymptoms.  - Volume status stable by exam/Optivol  - Restart spironolactone 25 mg daily  - Continue carvedilol 6.25 mg BID (uptitration limited by excessive fatigue), furosemide 60 mg BID, KCl 20 mEq BID, Entresto 49-51 mg BID - Basic disease state pathophysiology, medication indication, mechanism and side effects reviewed at length with patient and he verbalized understanding 2. SVT - S/p ablation for atrial tachycardia in 01/2014 - Continue amiodarone 200 mg BID. Sees Dr. Caryl Comes.  - Needs yearly eye exams - Last TSH and LFTs all WNL on 09/03/16  Plan: 1) Medication changes: Based on clinical presentation, vital signs and recent labs will restart spironolactone 25 mg daily  2) Labs: BMET in 1 week  3) Follow-up: Dr. Haroldine Laws on 03/03/17    Ruta Hinds. Velva Harman, PharmD, BCPS, CPP Clinical Pharmacist Pager: [phone] Phone: [phone] 12/31/2016 2:43 PM

## 2017-01-05 ENCOUNTER — Encounter: Payer: Self-pay | Admitting: Internal Medicine

## 2017-01-05 ENCOUNTER — Ambulatory Visit (INDEPENDENT_AMBULATORY_CARE_PROVIDER_SITE_OTHER): Payer: Self-pay | Admitting: Internal Medicine

## 2017-01-05 VITALS — BP 112/70 | HR 97 | Ht 67.0 in | Wt 207.8 lb

## 2017-01-05 DIAGNOSIS — Z95810 Presence of automatic (implantable) cardiac defibrillator: Secondary | ICD-10-CM

## 2017-01-05 DIAGNOSIS — I5022 Chronic systolic (congestive) heart failure: Secondary | ICD-10-CM

## 2017-01-05 DIAGNOSIS — I472 Ventricular tachycardia, unspecified: Secondary | ICD-10-CM

## 2017-01-05 DIAGNOSIS — I429 Cardiomyopathy, unspecified: Secondary | ICD-10-CM

## 2017-01-05 DIAGNOSIS — I471 Supraventricular tachycardia: Secondary | ICD-10-CM

## 2017-01-05 DIAGNOSIS — I420 Dilated cardiomyopathy: Secondary | ICD-10-CM

## 2017-01-05 LAB — CUP PACEART INCLINIC DEVICE CHECK
Brady Statistic AP VS Percent: 0.08 %
Brady Statistic AS VP Percent: 0.03 %
Brady Statistic RV Percent Paced: 0.03 %
Date Time Interrogation Session: 20180129170836
HIGH POWER IMPEDANCE MEASURED VALUE: 66 Ohm
HighPow Impedance: 285 Ohm
Implantable Lead Implant Date: 20130906
Implantable Lead Location: 753859
Implantable Lead Model: 181
Implantable Lead Model: 5076
Implantable Lead Serial Number: 322962
Lead Channel Impedance Value: 304 Ohm
Lead Channel Pacing Threshold Amplitude: 0.5 V
Lead Channel Pacing Threshold Amplitude: 1 V
Lead Channel Sensing Intrinsic Amplitude: 10.5 mV
Lead Channel Sensing Intrinsic Amplitude: 12 mV
Lead Channel Setting Pacing Amplitude: 2 V
Lead Channel Setting Pacing Pulse Width: 0.4 ms
MDC IDC LEAD IMPLANT DT: 20130906
MDC IDC LEAD LOCATION: 753860
MDC IDC MSMT BATTERY VOLTAGE: 3.06 V
MDC IDC MSMT LEADCHNL RA IMPEDANCE VALUE: 399 Ohm
MDC IDC MSMT LEADCHNL RA PACING THRESHOLD PULSEWIDTH: 0.4 ms
MDC IDC MSMT LEADCHNL RA SENSING INTR AMPL: 1.625 mV
MDC IDC MSMT LEADCHNL RA SENSING INTR AMPL: 2.125 mV
MDC IDC MSMT LEADCHNL RV PACING THRESHOLD PULSEWIDTH: 0.4 ms
MDC IDC PG IMPLANT DT: 20130906
MDC IDC SET LEADCHNL RV PACING AMPLITUDE: 2.5 V
MDC IDC SET LEADCHNL RV SENSING SENSITIVITY: 0.3 mV
MDC IDC STAT BRADY AP VP PERCENT: 0 %
MDC IDC STAT BRADY AS VS PERCENT: 99.89 %
MDC IDC STAT BRADY RA PERCENT PACED: 0.08 %

## 2017-01-05 MED ORDER — AMIODARONE HCL 200 MG PO TABS: ORAL_TABLET | ORAL | Status: DC

## 2017-01-05 MED ORDER — SPIRONOLACTONE 25 MG PO TABS: ORAL_TABLET | ORAL | Status: DC

## 2017-01-05 MED ORDER — CARVEDILOL 12.5 MG PO TABS: 12.5000 mg | ORAL_TABLET | Freq: Two times a day (BID) | ORAL | 3 refills | Status: DC

## 2017-01-05 NOTE — Progress Notes (Signed)
Patient Care Team: No Pcp Per Patient as PCP - General (General Practice)   HPI  Sydney Shaffer is a 51 y.o. female Seen in followup for ICD implanted Sept 2013 for primary prevention in the setting of nonischemic cardiomyopathy. QRS was narrow.   She has episodes of increasing edema. These are associated with notable swelling which she addresses by taking increased diuretics.  Mostly her breathing status is been quite good. He's had no edema. She continues to struggle with sleeping on pillows at night. She goes to bed and then rales sometime later. However, there is also a great deal of mental consternation associated with sleeping. She wonders whether it may be anxiety. She's had a couple of episodes of ventricular tachycardia identified on the device. She has no associated symptoms.  She has a history of atrial tachycardia ablation. This was noted then approximated of the AV node ECHO 07/05/12 EF 20-25%  ECHO 02/04/14 EF 20-25%  DATE TEST    7/13    Echo   EF 20-25 %   2/15    Echo   EF 20-25 %   10/17 Echo EF 30-35   10/17 Cath Normal CA       Date TSH LFTs PFTs  9/17  2.555 22     /              Past Medical History:  Diagnosis Date  . Anemia    felt to be due to heavy menstrual flow  . Atrial tachycardia (HCC)    s/p ablation  . AVNRT (AV nodal re-entry tachycardia) ([place])    s/p ablation  . CHF (congestive heart failure) ([place])    Dx 03/2012 - dilated cardiomyopathy with EF 20-25% by echo (abnl nuc but normal coronaries 04/02/12 per cath.   . Chronic systolic heart failure ([place])   . Dysrhythmia    Bradycardia  . Hypertension   . Hypertensive heart disease with CHF ([place])   . ICD (implantable cardiac defibrillator) in place 08/13/2012    Past Surgical History:  Procedure Laterality Date  . APPENDECTOMY    . CARDIAC CATHETERIZATION  April 2013   normal coronaries  . CARDIAC CATHETERIZATION [name] 09/12/2016   Procedure: Right/Left Heart Cath and Coronary  Angiography;  Surgeon: Jolaine Artist, MD;  Location: [facility];  Service: Cardiovascular;  Laterality: [name];  . DILITATION & CURRETTAGE/HYSTROSCOPY WITH VERSAPOINT RESECTION [name] 10/05/2015   Procedure: DILATATION & CURETTAGE/HYSTEROSCOPY WITH VERSAPOINT RESECTION;  Surgeon: [name], MD;  Location: [facility];  Service: Gynecology;  Laterality: [name];  . EP study and ablation  01/07/13   Ablation of AVNRT and atrial tachycardia (arising from the anteroseptal RA 13mm above the HIS)  . ICD  08/13/2012  . IMPLANTABLE CARDIOVERTER DEFIBRILLATOR IMPLANT [name] 08/13/2012   Procedure: IMPLANTABLE CARDIOVERTER DEFIBRILLATOR IMPLANT;  Surgeon: Deboraha Sprang, MD;  Location: [facility] CATH LAB;  Service: Cardiovascular;  Laterality: [name];  . LEFT HEART CATHETERIZATION WITH CORONARY ANGIOGRAM [name] 04/02/2012   Procedure: LEFT HEART CATHETERIZATION WITH CORONARY ANGIOGRAM;  Surgeon: Peter M Martinique, MD;  Location: [facility] CATH LAB;  Service: Cardiovascular;  Laterality: [name];  . SUPRAVENTRICULAR TACHYCARDIA ABLATION [name] 01/07/2013   Procedure: SUPRAVENTRICULAR TACHYCARDIA ABLATION;  Surgeon: Thompson Grayer, MD;  Location: [facility] C[facility] LAB;  Service: Cardiovascular;  Laterality: [name];  . TUBAL LIGATION      Current Outpatient Prescriptions  Medication Sig Dispense Refill  . amiodarone (PACERONE) 200 MG tablet TAKE ONE TABLET BY MOUTH TWICE DAILY  60 tablet 3  . carvedilol (COREG) 6.25 MG tablet Take 1 tablet (6.25 mg total) by mouth 2 (two) times daily. 180 tablet 3  . Doxylamine Succinate, Sleep, (SLEEP AID PO) Take 1 tablet by mouth at bedtime as needed (sleep).     . ferrous sulfate 325 (65 FE) MG tablet Take 325 mg by mouth daily with breakfast.    . furosemide (LASIX) 40 MG tablet Take 1.5 tablets (60 mg total) by mouth 2 (two) times daily. 90 tablet 5  . Multiple Vitamin (MULTIVITAMIN WITH MINERALS) TABS Take 1 tablet by mouth daily.    . Multiple Vitamins-Minerals (HAIR SKIN AND NAILS FORMULA PO) Take 1 tablet by  mouth daily.    . potassium chloride SA (K-DUR,KLOR-CON) 20 MEQ tablet Take 1 tablet (20 mEq total) by mouth 2 (two) times daily. 60 tablet 5  . sacubitril-valsartan (ENTRESTO) 49-51 MG Take 1 tablet by mouth 2 (two) times daily. 60 tablet 6  . spironolactone (ALDACTONE) 25 MG tablet Take 1 tablet (25 mg total) by mouth daily. 30 tablet 5   No current facility-administered medications for this visit.     No Known Allergies  Review of Systems negative except from HPI and PMH  Physical Exam BP 112/70   Pulse 97   Ht 5\' 7"  (1.702 m)   Wt 207 lb 12.8 oz (94.3 kg)   LMP 11/21/2016 (Approximate)   SpO2 96%   BMI 32.55 kg/m  Well developed and well nourished in no acute distress HENT normal E scleral and icterus clear Neck Supple JVP flat; carotids brisk and full Clear to ausculation Regular rate and rhythm, no murmurs gallops or rub Soft with active bowel sounds No clubbing cyanosis  Edema Alert and oriented, grossly normal motor and sensory function Skin Warm and Dry  ECG demonstrates sinus rhythm at 56 Interval 17/10/52  Assessment and  Plan  Nonischemic cardiomyopathy  Atrial Tachycardia/Flutter (CL310)  ICD Medtronic The patient's device was interrogated and the information was fully reviewed.  The device was reprogrammed to  Lower rate limit from 60--50  CHF chronic systolic  Ventricular tachycardia-pace terminated  Sleep disturbance  Euvolemic continue current meds  No intercurrent Ventricular tachycardia  She has intercurrent atrial arrhythmias.  Given the fact that these are relatively rapid and going into the VT monitoring zone i.e. greater than 170, we will decrease her Aldactone 25--12-1/2 increase her carvedilol 6..25--12.5  Amiodarone surveillance laboratories were normal 9/17  We will continue to follow her atrial rhythms in terms of making decisions regarding anticoagulation.  Her sleep disturbance  may be nocturnal dyspnea; may also be some  anxiety component of this.we have discussed the importance of getting a primary care physician. There may be a depressive component as well  Discussed wotj Dr Reine Just

## 2017-01-05 NOTE — Patient Instructions (Addendum)
Medication Instructions: - Your physician has recommended you make the following change in your medication:  1) Decrease amiodarone 200 mg- take 1 & 1/2 tablets (300 mg) by mouth once daily 2) Increase coreg (carvedilol) 12.5 mg- take one tablet by mouth twice daily 3) Decrease aldactone (spironolactone) 25 mg)- take 1/2 tablet (12.5 mg) by mouth once daily  Labwork: - none ordered  Procedures/Testing: - none ordered  Follow-Up: - Your physician recommends that you schedule a follow-up appointment in: 3-4 months with Chanetta Marshall, NP for Dr. Caryl Comes.  Any Additional Special Instructions Will Be Listed Below (If Applicable).     If you need a refill on your cardiac medications before your next appointment, please call your pharmacy.

## 2017-01-07 ENCOUNTER — Ambulatory Visit (HOSPITAL_COMMUNITY)
Admission: RE | Admit: 2017-01-07 | Discharge: 2017-01-07 | Disposition: A | Discharge status: Home / Self Care | Payer: Self-pay | Source: Ambulatory Visit | Attending: Internal Medicine | Admitting: Internal Medicine

## 2017-01-07 ENCOUNTER — Other Ambulatory Visit (HOSPITAL_COMMUNITY): Payer: Self-pay | Admitting: *Deleted

## 2017-01-07 DIAGNOSIS — I5022 Chronic systolic (congestive) heart failure: Secondary | ICD-10-CM

## 2017-01-07 LAB — BASIC METABOLIC PANEL
ANION GAP: 8 (ref 5–15)
BUN: 13 mg/dL (ref 6–20)
CHLORIDE: 108 mmol/L (ref 101–111)
CO2: 23 mmol/L (ref 22–32)
Calcium: 10 mg/dL (ref 8.9–10.3)
Creatinine, Ser: 0.83 mg/dL (ref 0.44–1.00)
GFR calc Af Amer: >60 mL/min (ref >60)
GLUCOSE: 109 mg/dL — AB (ref 65–99)
POTASSIUM: 4.5 mmol/L (ref 3.5–5.1)
SODIUM: 139 mmol/L (ref 135–145)

## 2017-01-07 NOTE — Addendum Note (Signed)
Encounter addended by: Kerry Dory, CMA on: 01/07/2017  3:22 PM<BR>    Actions taken: Sign clinical note

## 2017-01-07 NOTE — Progress Notes (Signed)
Samples of this drug were given to the patient, quantity 28, Lot Number [id number] ENTRESTO 49/51MG  EX 09/2018

## 2017-01-08 ENCOUNTER — Telehealth: Payer: Self-pay | Admitting: Licensed Clinical Social Worker

## 2017-01-08 NOTE — Telephone Encounter (Signed)
SW Intern reached out to pt for medicaid application follow up and left message. Pt returned call and informed SW Intern she had been denied for medicaid due to "caseworker never receiving medical forms and records" and her income. Pt says her caseworker sent a letter requesting medical records but never received correspondence back. SW Intern asked for caseworkers name and pt indicated that she thought it was "Pam" and gave [phone] as her contact information. Pt verbalized she will reapply for medicaid "sometime soon" and that she would call back with new developments or if she found her caseworkers last name or a different number to contact her. SW Intern will await pt return call. [place] Intern [place], [place] [phone]

## 2017-03-03 ENCOUNTER — Encounter (HOSPITAL_COMMUNITY): Payer: Self-pay | Admitting: Internal Medicine

## 2017-04-09 ENCOUNTER — Other Ambulatory Visit (HOSPITAL_COMMUNITY): Payer: Self-pay | Admitting: Cardiology

## 2017-04-09 MED ORDER — AMIODARONE HCL 200 MG PO TABS: 300.0000 mg | ORAL_TABLET | Freq: Every day | ORAL | 3 refills | Status: DC

## 2017-04-15 ENCOUNTER — Encounter: Payer: Self-pay | Admitting: Internal Medicine

## 2017-04-16 ENCOUNTER — Encounter (HOSPITAL_COMMUNITY): Payer: Self-pay | Admitting: Internal Medicine

## 2017-04-16 ENCOUNTER — Ambulatory Visit (HOSPITAL_COMMUNITY)
Admission: RE | Admit: 2017-04-16 | Discharge: 2017-04-16 | Disposition: A | Discharge status: Home / Self Care | Payer: Self-pay | Source: Ambulatory Visit | Attending: Internal Medicine | Admitting: Internal Medicine

## 2017-04-16 VITALS — BP 118/71 | HR 81 | Ht 67.0 in | Wt 205.4 lb

## 2017-04-16 DIAGNOSIS — D649 Anemia, unspecified: Secondary | ICD-10-CM | POA: Insufficient documentation

## 2017-04-16 DIAGNOSIS — I429 Cardiomyopathy, unspecified: Secondary | ICD-10-CM | POA: Insufficient documentation

## 2017-04-16 DIAGNOSIS — N920 Excessive and frequent menstruation with regular cycle: Secondary | ICD-10-CM | POA: Insufficient documentation

## 2017-04-16 DIAGNOSIS — R001 Bradycardia, unspecified: Secondary | ICD-10-CM | POA: Insufficient documentation

## 2017-04-16 DIAGNOSIS — Z95810 Presence of automatic (implantable) cardiac defibrillator: Secondary | ICD-10-CM | POA: Insufficient documentation

## 2017-04-16 DIAGNOSIS — Z803 Family history of malignant neoplasm of breast: Secondary | ICD-10-CM | POA: Insufficient documentation

## 2017-04-16 DIAGNOSIS — I420 Dilated cardiomyopathy: Secondary | ICD-10-CM | POA: Insufficient documentation

## 2017-04-16 DIAGNOSIS — Z9889 Other specified postprocedural states: Secondary | ICD-10-CM | POA: Insufficient documentation

## 2017-04-16 DIAGNOSIS — I471 Supraventricular tachycardia: Secondary | ICD-10-CM | POA: Insufficient documentation

## 2017-04-16 DIAGNOSIS — I5022 Chronic systolic (congestive) heart failure: Secondary | ICD-10-CM | POA: Insufficient documentation

## 2017-04-16 DIAGNOSIS — I110 Hypertensive heart disease with heart failure: Secondary | ICD-10-CM | POA: Insufficient documentation

## 2017-04-16 MED ORDER — SACUBITRIL-VALSARTAN 97-103 MG PO TABS: 1.0000 | ORAL_TABLET | Freq: Two times a day (BID) | ORAL | 11 refills | Status: DC

## 2017-04-16 MED ORDER — SACUBITRIL-VALSARTAN 97-103 MG PO TABS: 1.0000 | ORAL_TABLET | Freq: Two times a day (BID) | ORAL | 3 refills | Status: DC

## 2017-04-16 NOTE — Progress Notes (Signed)
Advanced Heart Failure Clinic Note   Patient ID: Sydney Shaffer, female   DOB: 1966-11-10, 51 y.o.   MRN: [id number] EP: Dr Caryl Comes Medtronic ICD PCP: None   HPI: Sydney Shaffer is a 51 y.o. female with a history of nonischemic cardiomyopathy s/p Medtronic ICD, HTN, chronic systolic heart failure and SVT s/p 01/08/14 ablation.  Cath 4/13 normal coronaries    Admitted 02/03/14 with chest pain through 02/05/14. Troponin negative. She had been out of metoprolol. She was discharged on coreg 12.5 mg twice a day, lasix 40 mg twice a day, spiro 12.5 mg daily, and lisinopril 5 mg daily.  Discharge weight 195 pounds.    She has had trouble with anemia thought to be due to menorrhagia.  She was admitted in 6/16 with symptomatic anemia and transfused.   Today she returns for HF follow up. Overall feeling ok. Weight at home 198-202 pounds. Eating high salt foods such as salisbury steak/cheetos. Says she is walking daily 30 minutes.Taking medications. Continues to work full time.    Optivol today: Fluid index ok. Activity ~ 6 hours.   ECHO 07/05/12 EF 20-25%  ECHO 02/04/14 EF 20-25%  ECHO 6/16 EF 30-35% mild MR ECHO 09/2016 EF 25-30%, Grade 1 DD  RHC 09/12/16 RA = 3 RV = 23/4 PA = 22/6 (14) PCW = 8 Ao = 123/69 (91) LV = 106/6 Fick cardiac output/index = 7.4/3.5 PVR = < 1.0 WU SVR = 958  FA sat = 88% PA sat = 63%, 62%   CPX 06/03/14: FVC 1.69 (51%)  FEV1 1.24 (47%)  FEV1/FVC 74%  Peak VO2: 12.6 (57.8% predicted peak VO2) VE/VCO2 slope: 28.6 OUES: 1.60 Peak RER: 1.01 Peak VO2 adjusted to the patient's ideal body weight of 150 lb (67.8 kg) the peak VO2 is 16.5 ml/kg (ibw)/min (62% of the ibw-adjusted predicted).  ECG: NSR, iRBBB, nonspecific T wave flattening  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 She does not drink alcohol . Lives alone. She has 3 grown children   FH: Mom breast cancer . Father cancer   ROS: All systems negative except as listed in HPI, PMH  and Problem List.  Past Medical History:  Diagnosis Date  . Anemia    felt to be due to heavy menstrual flow  . Atrial tachycardia (HCC)    s/p ablation  . AVNRT (AV nodal re-entry tachycardia) ([place])    s/p ablation  . CHF (congestive heart failure) ([place])    Dx 03/2012 - dilated cardiomyopathy with EF 20-25% by echo (abnl nuc but normal coronaries 04/02/12 per cath.   . Chronic systolic heart failure ([place])   . Dysrhythmia    Bradycardia  . Hypertension   . Hypertensive heart disease with CHF ([place])   . ICD (implantable cardiac defibrillator) in place 08/13/2012    Current Outpatient Prescriptions  Medication Sig Dispense Refill  . amiodarone (PACERONE) 200 MG tablet Take 1.5 tablets (300 mg total) by mouth daily. 45 tablet 3  . carvedilol (COREG) 12.5 MG tablet Take 1 tablet (12.5 mg total) by mouth 2 (two) times daily. 180 tablet 3  . Doxylamine Succinate, Sleep, (SLEEP AID PO) Take 1 tablet by mouth at bedtime as needed (sleep).     . ferrous sulfate 325 (65 FE) MG tablet Take 325 mg by mouth daily with breakfast.    . furosemide (LASIX) 40 MG tablet Take 1.5 tablets (60 mg total) by mouth 2 (two) times daily. 90 tablet 5  .  Multiple Vitamin (MULTIVITAMIN WITH MINERALS) TABS Take 1 tablet by mouth daily.    . Multiple Vitamins-Minerals (HAIR SKIN AND NAILS FORMULA PO) Take 1 tablet by mouth daily.    . potassium chloride SA (K-DUR,KLOR-CON) 20 MEQ tablet Take 1 tablet (20 mEq total) by mouth 2 (two) times daily. 60 tablet 5  . sacubitril-valsartan (ENTRESTO) 49-51 MG Take 1 tablet by mouth 2 (two) times daily. 60 tablet 6  . spironolactone (ALDACTONE) 25 MG tablet Take 1/2 tablet (12.5 mg) by mouth once daily     No current facility-administered medications for this encounter.     Vitals:   04/16/17 1504  BP: 118/71  Pulse: 81  Weight: 205 lb 6.4 oz (93.2 kg)  Height: 5\' 7"  (1.702 m)   Wt Readings from Last 3 Encounters:  04/16/17 205 lb 6.4 oz (93.2 kg)  01/05/17 207 lb  12.8 oz (94.3 kg)  12/03/16 211 lb 12 oz (96 kg)     PHYSICAL EXAM: General:  Well appearing. No resp difficulty HEENT: normal Neck: supple. JVP 6-7. Carotids 2+ bilat; no bruits. No lymphadenopathy or thryomegaly appreciated. Cor: PMI nondisplaced. Regular rate & rhythm. No rubs, gallops or murmurs. Lungs: clear Abdomen: obese soft, nontender, nondistended. No hepatosplenomegaly. No bruits or masses. Good bowel sounds. Extremities: no cyanosis, clubbing, rash, R and LLE 1+ edema Neuro: alert & orientedx3, cranial nerves grossly intact. moves all 4 extremities w/o difficulty. Affect pleasant  ASSESSMENT & PLAN:  1. Chronic Systolic Heart Failure: Normal coronaries by cath 2013. Nonischemic cardiomyopathy s/p Medtronic ICD. EF 30-35% (05/2015 echo). Ech 10/17 25-30%   NYHA II. - Volume status mildly elevated.  Continue lasix 60 mg bid and KCl 40 daily.  -Increase entresto 97-103 mg twice a day.   - Continue spironolactone 12.5 mg daily  - Continue  Coreg 12.5  mg bid.                                                                                                                 -  2. SVT: s/p ablation for atrial tachycardia in 01/2014.  Several very short episodes on Optivol ( Less than 10 episodes, lasting 1-2 minutes) - Continue amiodarone 300 mg daily.  Needs regular eye exams.     Set up CPX. BMET in 2 week.   Follow up 3 months   [place], NP-C  04/16/2017  Patient seen and examined with Darrick Grinder, NP. We discussed all aspects of the encounter. I agree with the assessment and plan as stated above.   Volume status mildly elevated in setting of dietary indiscretion. NYHA II-early III in face of persistent severe LV dysfunction. Will proceed with CPX testing. Increase entresto. Reinforced need for daily weights and reviewed use of sliding scale diuretics. ICD interrogated personally in clinic.   Glori Bickers, MD  10:02 PM

## 2017-04-16 NOTE — Patient Instructions (Signed)
Increase Entresto to 49/51 mg Twice daily   Labs in 2 weeks  Your physician has recommended that you have a cardiopulmonary stress test (CPX). CPX testing is a non-invasive measurement of heart and lung function. It replaces a traditional treadmill stress test. This type of test provides a tremendous amount of information that relates not only to your present condition but also for future outcomes. This test combines measurements of you ventilation, respiratory gas exchange in the lungs, electrocardiogram (EKG), blood pressure and physical response before, during, and following an exercise protocol.  Your physician recommends that you schedule a follow-up appointment in: 3 months

## 2017-04-16 NOTE — Progress Notes (Signed)
Medication Samples have been provided to the patient.  Drug name: Delene Loll       Strength: 49/51mg         Qty: 2  LOT: [id number]  Exp.Date: 10/19  Dosing instructions: 1 tab Twice daily   The patient has been instructed regarding the correct time, dose, and frequency of taking this medication, including desired effects and most common side effects.   Nira Conn Oryon Gary 3:44 PM 04/16/2017   New form for [employer] patient assit completed and signed by pt, she is aware and agreeable to send [place] her income information.  Pt will continue the 49/51 mg for now until enrolled in the patient assit program, once she gets the medication from them she will let us know and we will schedule labs for 2 weeks after she starts the 97/103 mg dose.

## 2017-04-28 ENCOUNTER — Ambulatory Visit (HOSPITAL_COMMUNITY): Payer: Self-pay | Attending: Internal Medicine

## 2017-04-28 ENCOUNTER — Encounter (HOSPITAL_COMMUNITY): Payer: Self-pay | Admitting: *Deleted

## 2017-04-28 DIAGNOSIS — I5022 Chronic systolic (congestive) heart failure: Secondary | ICD-10-CM

## 2017-04-28 DIAGNOSIS — I509 Heart failure, unspecified: Secondary | ICD-10-CM | POA: Insufficient documentation

## 2017-04-29 ENCOUNTER — Encounter (HOSPITAL_COMMUNITY): Payer: Self-pay | Admitting: Pharmacist

## 2017-04-30 DIAGNOSIS — I5022 Chronic systolic (congestive) heart failure: Secondary | ICD-10-CM

## 2017-05-05 ENCOUNTER — Encounter: Payer: Self-pay | Admitting: Internal Medicine

## 2017-05-06 ENCOUNTER — Telehealth (HOSPITAL_COMMUNITY): Payer: Self-pay | Admitting: Pharmacist

## 2017-05-06 NOTE — Telephone Encounter (Signed)
[employer] patient assistance approved for Entresto 97-103 mg BID through 05/05/18.   Ruta Hinds. Velva Harman, PharmD, BCPS, CPP Clinical Pharmacist Pager: [phone] Phone: [phone] 05/06/2017 8:44 AM

## 2017-06-05 ENCOUNTER — Other Ambulatory Visit (HOSPITAL_COMMUNITY): Payer: Self-pay | Admitting: Cardiology

## 2017-06-05 MED ORDER — POTASSIUM CHLORIDE CRYS ER 20 MEQ PO TBCR: 20.0000 meq | EXTENDED_RELEASE_TABLET | Freq: Two times a day (BID) | ORAL | 5 refills | Status: DC

## 2017-06-23 ENCOUNTER — Other Ambulatory Visit (HOSPITAL_COMMUNITY): Payer: Self-pay | Admitting: *Deleted

## 2017-06-23 DIAGNOSIS — I5022 Chronic systolic (congestive) heart failure: Secondary | ICD-10-CM

## 2017-06-23 MED ORDER — FUROSEMIDE 40 MG PO TABS: 60.0000 mg | ORAL_TABLET | Freq: Two times a day (BID) | ORAL | 5 refills | Status: DC

## 2017-07-16 ENCOUNTER — Inpatient Hospital Stay (HOSPITAL_COMMUNITY): Admission: RE | Admit: 2017-07-16 | Payer: Self-pay | Source: Ambulatory Visit | Admitting: Internal Medicine

## 2017-07-17 ENCOUNTER — Other Ambulatory Visit (HOSPITAL_COMMUNITY): Payer: Self-pay | Admitting: Internal Medicine

## 2017-07-17 ENCOUNTER — Encounter: Payer: Self-pay | Admitting: Internal Medicine

## 2017-10-15 ENCOUNTER — Encounter (INDEPENDENT_AMBULATORY_CARE_PROVIDER_SITE_OTHER): Payer: Self-pay

## 2017-10-15 ENCOUNTER — Ambulatory Visit (INDEPENDENT_AMBULATORY_CARE_PROVIDER_SITE_OTHER): Payer: Self-pay | Admitting: Internal Medicine

## 2017-10-15 VITALS — BP 104/60 | HR 67 | Ht 67.0 in | Wt 208.0 lb

## 2017-10-15 DIAGNOSIS — I471 Supraventricular tachycardia: Secondary | ICD-10-CM

## 2017-10-15 DIAGNOSIS — I5022 Chronic systolic (congestive) heart failure: Secondary | ICD-10-CM

## 2017-10-15 DIAGNOSIS — Z95810 Presence of automatic (implantable) cardiac defibrillator: Secondary | ICD-10-CM

## 2017-10-15 DIAGNOSIS — I420 Dilated cardiomyopathy: Secondary | ICD-10-CM

## 2017-10-15 MED ORDER — AMIODARONE HCL 200 MG PO TABS: 200.0000 mg | ORAL_TABLET | Freq: Every day | ORAL | 3 refills | Status: DC

## 2017-10-15 MED ORDER — SPIRONOLACTONE 25 MG PO TABS: ORAL_TABLET | ORAL | Status: DC

## 2017-10-15 MED ORDER — CARVEDILOL 12.5 MG PO TABS: 12.5000 mg | ORAL_TABLET | Freq: Two times a day (BID) | ORAL | 3 refills | Status: DC

## 2017-10-15 MED ORDER — AMIODARONE HCL 200 MG PO TABS: 300.0000 mg | ORAL_TABLET | Freq: Every day | ORAL | 3 refills | Status: DC

## 2017-10-15 NOTE — Patient Instructions (Addendum)
Medication Instructions:  Your physician has recommended you make the following change in your medication:  1.  Decrease your amiodarone to 200 mg (one tablet) by mouth daily.  Labwork: You will get CBC, CMET and TSH today.  Testing/Procedures: None ordered.  Follow-Up: Your physician wants you to follow-up in: one year with Dr. Caryl Comes or a member of his care team.   You will receive a reminder letter in the mail two months in advance. If you don't receive a letter, please call our office to schedule the follow-up appointment.  Remote monitoring is used to monitor your ICD from home. This monitoring reduces the number of office visits required to check your device to one time per year. It allows [place] to keep an eye on the functioning of your device to ensure it is working properly. You are scheduled for a device check from home on 01/14/2017. You may send your transmission at any time that day. If you have a wireless device, the transmission will be sent automatically. After your physician reviews your transmission, you will receive a postcard with your next transmission date.    Any Other Special Instructions Will Be Listed Below (If Applicable).  You are being referred for cardiac rehabilitation.   If you need a refill on your cardiac medications before your next appointment, please call your pharmacy.

## 2017-10-15 NOTE — Progress Notes (Signed)
Patient Care Team: Patient, No Pcp Per as PCP - General (General Practice)   HPI  Sydney Shaffer is a 51 y.o. female Seen in followup for ICD implanted Sept 2013 for primary prevention in the setting of nonischemic cardiomyopathy. QRS was narrow.    She has a history of SVT ablation in 2014.  At this study, she was found to have a tachycardia focus in the region of the AV node which was successfully ablated.  She also had typical AV nodal reentry and successful slow pathway elimination.  When seen 2017 atrial arrhythmia was noted to have recurred.  She has been maintained on amiodarone.  She is also had ventricular tachycardia.  She has modest dyspnea on exertion at less than 1 flight of stairs.  She does not have peripheral edema.  Has had no chest discomfort.   Patient denies symptoms of GI intolerance, sun sensitivity, neurological symptoms attributable to amiodarone.  Surveillance laboratories were in normal limits when checked 9 months  ago     DATE TEST    7/13 Echo   EF 20-25 %   2/15 Echo   EF 20-25 %   10/17 Echo EF 30-35   10/17 Cath  Normal CA      Date Cr K Hgb TSH LFTs PFTs  9/17      2.555 22    1 /  18 0.83 4.5             Past Medical History:  Diagnosis Date  . Anemia    felt to be due to heavy menstrual flow  . Atrial tachycardia (HCC)    s/p ablation  . AVNRT (AV nodal re-entry tachycardia) ([place])    s/p ablation  . CHF (congestive heart failure) ([place])    Dx 03/2012 - dilated cardiomyopathy with EF 20-25% by echo (abnl nuc but normal coronaries 04/02/12 per cath.   . Chronic systolic heart failure ([place])   . Dysrhythmia    Bradycardia  . Hypertension   . Hypertensive heart disease with CHF ([place])   . ICD (implantable cardiac defibrillator) in place 08/13/2012    Past Surgical History:  Procedure Laterality Date  . APPENDECTOMY    . CARDIAC CATHETERIZATION  April 2013   normal coronaries  . EP study and ablation  01/07/13   Ablation of  AVNRT and atrial tachycardia (arising from the anteroseptal RA 70mm above the HIS)  . ICD  08/13/2012  . TUBAL LIGATION      Current Outpatient Medications  Medication Sig Dispense Refill  . amiodarone (PACERONE) 200 MG tablet Take 1.5 tablets (300 mg total) daily by mouth. 45 tablet 3  . carvedilol (COREG) 12.5 MG tablet Take 1 tablet (12.5 mg total) 2 (two) times daily by mouth. 180 tablet 3  . Doxylamine Succinate, Sleep, (SLEEP AID PO) Take 1 tablet by mouth at bedtime as needed (sleep).     . ferrous sulfate 325 (65 FE) MG tablet Take 325 mg by mouth daily with breakfast.    . furosemide (LASIX) 40 MG tablet Take 1.5 tablets (60 mg total) by mouth 2 (two) times daily. 90 tablet 5  . Multiple Vitamin (MULTIVITAMIN WITH MINERALS) TABS Take 1 tablet by mouth daily.    . Multiple Vitamins-Minerals (HAIR SKIN AND NAILS FORMULA PO) Take 1 tablet by mouth daily.    . potassium chloride SA (K-DUR,KLOR-CON) 20 MEQ tablet Take 1 tablet (20 mEq total) by mouth 2 (two) times daily. 60 tablet 5  .  sacubitril-valsartan (ENTRESTO) 97-103 MG Take 1 tablet by mouth 2 (two) times daily. 180 tablet 3  . spironolactone (ALDACTONE) 25 MG tablet Take 1/2 tablet (12.5 mg) by mouth once daily     No current facility-administered medications for this visit.     No Known Allergies  Review of Systems negative except from HPI and PMH  Physical Exam BP 104/60   Pulse 67   Ht 5\' 7"  (1.702 m)   Wt 208 lb (94.3 kg)   SpO2 98%   BMI 32.58 kg/m  Well developed and nourished in no acute distress HENT normal Neck supple with JVP-flat Carotids brisk and full without bruits Clear Regular rate and rhythm, no murmurs or gallops Abd-soft with active BS without hepatomegaly No Clubbing cyanosis edema Skin-warm and dry A & Oriented  Grossly normal sensory and motor function   ECG demonstrates sinus rhythm at 65 17/11/44  Assessment and  Plan  Nonischemic cardiomyopathy  Atrial Tachycardia/Flutter  (CL310)  ICD Medtronic The patient's device was interrogated and the information was fully reviewed.  The device was reprogrammed to  Lower rate limit from 60--50  CHF chronic systolic  Ventricular tachycardia-CL 330  Euvolemic continue current meds  She has had intercurrent ventricular tachycardia.  It lasted 3+ minutes; we have reprogrammed the device to detect this.  The VT zone has no shock therapies.  The episode was 6 months ago so no driving implications  No intercurrent atrial arrhythmias.    Amiodarone surveillance metabolic profile will be checked today.  CBC also.  She is on iron supplementation.  No indication for anticoagulation.  We will continue to follow her atrial rhythms in terms of making decisions regarding anticoagulation. We will refer her to cardiac rehab

## 2017-10-16 LAB — COMPREHENSIVE METABOLIC PANEL
ALK PHOS: 68 IU/L (ref 39–117)
ALT: 18 IU/L (ref 0–32)
AST: 21 IU/L (ref 0–40)
Albumin/Globulin Ratio: 1.2 (ref 1.2–2.2)
Albumin: 4.5 g/dL (ref 3.5–5.5)
BILIRUBIN TOTAL: 0.5 mg/dL (ref 0.0–1.2)
BUN/Creatinine Ratio: 18 (ref 9–23)
BUN: 17 mg/dL (ref 6–24)
CHLORIDE: 101 mmol/L (ref 96–106)
CO2: 26 mmol/L (ref 20–29)
Calcium: 10.1 mg/dL (ref 8.7–10.2)
Creatinine, Ser: 0.93 mg/dL (ref 0.57–1.00)
GFR calc non Af Amer: 71 mL/min/{1.73_m2} (ref >59)
GFR, EST AFRICAN AMERICAN: 82 mL/min/{1.73_m2} (ref >59)
GLUCOSE: 98 mg/dL (ref 65–99)
Globulin, Total: 3.7 g/dL (ref 1.5–4.5)
Potassium: 3.9 mmol/L (ref 3.5–5.2)
Sodium: 142 mmol/L (ref 134–144)
TOTAL PROTEIN: 8.2 g/dL (ref 6.0–8.5)

## 2017-10-16 LAB — CBC WITH DIFFERENTIAL/PLATELET
BASOS ABS: 0 10*3/uL (ref 0.0–0.2)
Basos: 0 %
EOS (ABSOLUTE): 0.2 10*3/uL (ref 0.0–0.4)
Eos: 3 %
HEMOGLOBIN: 12.8 g/dL (ref 11.1–15.9)
Hematocrit: 39.4 % (ref 34.0–46.6)
Immature Grans (Abs): 0 10*3/uL (ref 0.0–0.1)
Immature Granulocytes: 0 %
LYMPHS ABS: 1.8 10*3/uL (ref 0.7–3.1)
LYMPHS: 28 %
MCH: 27.7 pg (ref 26.6–33.0)
MCHC: 32.5 g/dL (ref 31.5–35.7)
MCV: 85 fL (ref 79–97)
MONOCYTES: 1 %
Monocytes Absolute: 0.1 10*3/uL (ref 0.1–0.9)
Neutrophils Absolute: 4.5 10*3/uL (ref 1.4–7.0)
Neutrophils: 68 %
Platelets: 359 10*3/uL (ref 150–379)
RBC: 4.62 x10E6/uL (ref 3.77–5.28)
RDW: 14.2 % (ref 12.3–15.4)
WBC: 6.6 10*3/uL (ref 3.4–10.8)

## 2017-10-16 LAB — TSH: TSH: 1.91 u[IU]/mL (ref 0.450–4.500)

## 2017-10-19 ENCOUNTER — Telehealth: Payer: Self-pay

## 2017-10-19 ENCOUNTER — Telehealth: Payer: Self-pay | Admitting: Internal Medicine

## 2017-10-19 NOTE — Telephone Encounter (Signed)
Patient made aware of results. Patient verbalizes understanding.  

## 2017-10-19 NOTE — Telephone Encounter (Signed)
Error

## 2017-10-19 NOTE — Telephone Encounter (Signed)
New message  Pt verbalized that she is returning call for the rn  For labs

## 2017-10-19 NOTE — Telephone Encounter (Signed)
Notes recorded by Deboraha Sprang, MD on 10/18/2017 at 5:21 PM EST Please inform patient that drug surveillance labs are normal

## 2017-10-19 NOTE — Telephone Encounter (Signed)
lmtcb for lab results

## 2018-01-14 ENCOUNTER — Ambulatory Visit (INDEPENDENT_AMBULATORY_CARE_PROVIDER_SITE_OTHER): Payer: Self-pay | Admitting: *Deleted

## 2018-01-14 ENCOUNTER — Telehealth: Payer: Self-pay | Admitting: Cardiology

## 2018-01-14 DIAGNOSIS — I420 Dilated cardiomyopathy: Secondary | ICD-10-CM

## 2018-01-14 NOTE — Telephone Encounter (Signed)
LMOVM reminding pt to send remote transmission.   

## 2018-01-18 NOTE — Progress Notes (Signed)
Remote ICD transmission.   

## 2018-01-20 ENCOUNTER — Encounter: Payer: Self-pay | Admitting: Cardiology

## 2018-02-04 LAB — CUP PACEART REMOTE DEVICE CHECK
Brady Statistic AP VP Percent: 0 %
Brady Statistic AP VS Percent: 0.21 %
Brady Statistic AS VP Percent: 0.03 %
Brady Statistic AS VS Percent: 99.76 %
Brady Statistic RV Percent Paced: 0.03 %
Date Time Interrogation Session: 20190209152408
HIGH POWER IMPEDANCE MEASURED VALUE: 70 Ohm
HighPow Impedance: 285 Ohm
Implantable Lead Implant Date: 20130906
Implantable Lead Location: 753860
Implantable Lead Serial Number: 322962
Implantable Pulse Generator Implant Date: 20130906
Lead Channel Impedance Value: 361 Ohm
Lead Channel Pacing Threshold Amplitude: 0.75 V
Lead Channel Pacing Threshold Pulse Width: 0.4 ms
Lead Channel Sensing Intrinsic Amplitude: 9 mV
Lead Channel Setting Pacing Amplitude: 2 V
Lead Channel Setting Pacing Amplitude: 2.5 V
Lead Channel Setting Pacing Pulse Width: 0.4 ms
Lead Channel Setting Sensing Sensitivity: 0.3 mV
MDC IDC LEAD IMPLANT DT: 20130906
MDC IDC LEAD LOCATION: 753859
MDC IDC MSMT BATTERY VOLTAGE: 2.94 V
MDC IDC MSMT LEADCHNL RA PACING THRESHOLD AMPLITUDE: 0.5 V
MDC IDC MSMT LEADCHNL RA PACING THRESHOLD PULSEWIDTH: 0.4 ms
MDC IDC MSMT LEADCHNL RA SENSING INTR AMPL: 1.625 mV
MDC IDC MSMT LEADCHNL RA SENSING INTR AMPL: 1.625 mV
MDC IDC MSMT LEADCHNL RV IMPEDANCE VALUE: 304 Ohm
MDC IDC MSMT LEADCHNL RV SENSING INTR AMPL: 9 mV
MDC IDC STAT BRADY RA PERCENT PACED: 0.21 %

## 2018-03-20 ENCOUNTER — Other Ambulatory Visit (HOSPITAL_COMMUNITY): Payer: Self-pay | Admitting: Internal Medicine

## 2018-04-06 ENCOUNTER — Other Ambulatory Visit: Payer: Self-pay

## 2018-04-06 ENCOUNTER — Encounter (HOSPITAL_COMMUNITY): Payer: Self-pay

## 2018-04-06 ENCOUNTER — Emergency Department (HOSPITAL_COMMUNITY)
Admission: EM | Admit: 2018-04-06 | Discharge: 2018-04-06 | Disposition: A | Discharge status: Home / Self Care | Payer: Self-pay | Attending: Emergency Medicine | Admitting: Emergency Medicine

## 2018-04-06 ENCOUNTER — Emergency Department (HOSPITAL_COMMUNITY): Payer: Self-pay

## 2018-04-06 DIAGNOSIS — I5022 Chronic systolic (congestive) heart failure: Secondary | ICD-10-CM | POA: Insufficient documentation

## 2018-04-06 DIAGNOSIS — R0789 Other chest pain: Secondary | ICD-10-CM | POA: Insufficient documentation

## 2018-04-06 DIAGNOSIS — I110 Hypertensive heart disease with heart failure: Secondary | ICD-10-CM | POA: Insufficient documentation

## 2018-04-06 DIAGNOSIS — Z79899 Other long term (current) drug therapy: Secondary | ICD-10-CM | POA: Insufficient documentation

## 2018-04-06 LAB — BASIC METABOLIC PANEL
Anion gap: 9 (ref 5–15)
BUN: 21 mg/dL — ABNORMAL HIGH (ref 6–20)
CALCIUM: 9.6 mg/dL (ref 8.9–10.3)
CHLORIDE: 103 mmol/L (ref 101–111)
CO2: 24 mmol/L (ref 22–32)
CREATININE: 0.95 mg/dL (ref 0.44–1.00)
Glucose, Bld: 96 mg/dL (ref 65–99)
Potassium: 3.9 mmol/L (ref 3.5–5.1)
SODIUM: 136 mmol/L (ref 135–145)

## 2018-04-06 LAB — CBC
HCT: 38.9 % (ref 36.0–46.0)
Hemoglobin: 12.8 g/dL (ref 12.0–15.0)
MCH: 27.1 pg (ref 26.0–34.0)
MCHC: 32.9 g/dL (ref 30.0–36.0)
MCV: 82.2 fL (ref 78.0–100.0)
PLATELETS: 290 10*3/uL (ref 150–400)
RBC: 4.73 MIL/uL (ref 3.87–5.11)
RDW: 14.5 % (ref 11.5–15.5)
WBC: 5.2 10*3/uL (ref 4.0–10.5)

## 2018-04-06 LAB — I-STAT BETA HCG BLOOD, ED (MC, WL, AP ONLY)

## 2018-04-06 LAB — BRAIN NATRIURETIC PEPTIDE: B Natriuretic Peptide: 24.2 pg/mL (ref 0.0–100.0)

## 2018-04-06 LAB — D-DIMER, QUANTITATIVE: D-Dimer, Quant: 0.41 ug/mL-FEU (ref 0.00–0.50)

## 2018-04-06 LAB — I-STAT TROPONIN, ED: TROPONIN I, POC: 0 ng/mL (ref 0.00–0.08)

## 2018-04-06 MED ORDER — ACETAMINOPHEN 500 MG PO TABS: 1000.0000 mg | ORAL_TABLET | Freq: Once | ORAL | Status: DC

## 2018-04-06 NOTE — ED Provider Notes (Signed)
[facility] EMERGENCY DEPARTMENT Provider Note   CSN: [id number] Arrival date & time: 04/06/18  0945     History   Chief Complaint Chief Complaint  Patient presents with  . Chest Pain    HPI Sydney Shaffer is a 52 y.o. female with a history of dilated cardiomyopathy with EF 20-25% s/p ICD, iron deficiency anemia, and HTN who presents to the emergency department with a chief complaint of chest pain.  The patient endorses sudden onset chest pain with associated dyspnea and diaphoresis that began at 6 AM.  Pain is left-sided and radiates to the right upper back, constant, and characterized as pressure-like.  She reports that the pain significantly worsened about 2 hours after onset.  No treatment prior to arrival.  No known aggravating or alleviating factors.  She also endorses intermittent lightheadedness.  She reports that her base weight is 235 pounds, which she weighed 2 days ago.  She states that she weighed 240 pounds today.  She reports mild swelling to her bilateral lower extremities.  She is been compliant with her home Lasix.  No history of DVT or PE.  She does not take birth control, no recent surgery or immobilization.  She denies fever, chills, cough, rash, palpitations, visual changes, nausea, vomiting, diarrhea, headache, syncope.  She was last seen by cardiology in December 2018.  The history is provided by the patient. No language interpreter was used.    Past Medical History:  Diagnosis Date  . Anemia    felt to be due to heavy menstrual flow  . Atrial tachycardia (HCC)    s/p ablation  . 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 CHF (congestive heart failure) ([place])    Dx 03/2012 - dilated cardiomyopathy with EF 20-25% by echo (abnl nuc but normal coronaries 04/02/12 per cath.   . Chronic systolic heart failure ([place])   . Dysrhythmia    Bradycardia  . Hypertension   . Hypertensive heart disease with CHF ([place])   . ICD  (implantable cardiac defibrillator) in place 08/13/2012    Patient Active Problem List   Diagnosis Date Noted  . Dilated cardiomyopathy ([place]) 06/04/2015  . Symptomatic anemia 06/04/2015  . Chest pain 06/04/2015  . Abnormal uterine bleeding (AUB) 06/04/2015  . Metrorrhagia 06/04/2015  . Abdominal pain   . Hypokalemia   . Menorrhagia   . VT (ventricular tachycardia) ([place]) 07/22/2014  . SVT (supraventricular tachycardia) ([place]) 03/30/2014  . HTN (hypertension) 03/30/2014  . Chest pressure- unspecified 02/03/2014  . Atrial tachycardia ([place]) 10/01/2012  . 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  . Hypertensive heart disease with CHF (congestive heart failure) ([place]) 08/04/2012  . 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 Chronic systolic heart failure ([place]) 05/05/2012  . Dyspnea 03/29/2012  . Anemia 03/29/2012    Past Surgical History:  Procedure Laterality Date  . APPENDECTOMY    . CARDIAC CATHETERIZATION  April 2013   normal coronaries  . CARDIAC CATHETERIZATION [name] 09/12/2016   Procedure: Right/Left Heart Cath and Coronary Angiography;  Surgeon: Jolaine Artist, MD;  Location: [facility];  Service: Cardiovascular;  Laterality: [name];  . DILITATION & CURRETTAGE/HYSTROSCOPY WITH VERSAPOINT RESECTION [name] 10/05/2015   Procedure: DILATATION & CURETTAGE/HYSTEROSCOPY WITH VERSAPOINT RESECTION;  Surgeon: [name], MD;  Location: [facility];  Service: Gynecology;  Laterality: [name];  . EP study and ablation  01/07/13   Ablation of AVNRT and atrial tachycardia (arising from the anteroseptal RA 3mm above the HIS)  .  ICD  08/13/2012  . IMPLANTABLE CARDIOVERTER DEFIBRILLATOR IMPLANT [name] 08/13/2012   Procedure: IMPLANTABLE CARDIOVERTER DEFIBRILLATOR IMPLANT;  Surgeon: Deboraha Sprang, MD;  Location: [facility] CATH LAB;  Service: Cardiovascular;  Laterality: [name];  . LEFT HEART CATHETERIZATION WITH CORONARY ANGIOGRAM [name] 04/02/2012   Procedure: LEFT HEART CATHETERIZATION WITH  CORONARY ANGIOGRAM;  Surgeon: Peter M Martinique, MD;  Location: [facility] CATH LAB;  Service: Cardiovascular;  Laterality: [name];  . SUPRAVENTRICULAR TACHYCARDIA ABLATION [name] 01/07/2013   Procedure: SUPRAVENTRICULAR TACHYCARDIA ABLATION;  Surgeon: Thompson Grayer, MD;  Location: [facility] CATH LAB;  Service: Cardiovascular;  Laterality: [name];  . TUBAL LIGATION       OB History   None      Home Medications    Prior to Admission medications   Medication Sig Start Date End Date Taking? Authorizing Provider  amiodarone (PACERONE) 200 MG tablet Take 1 tablet (200 mg total) daily by mouth. 10/15/17  Yes Deboraha Sprang, MD  carvedilol (COREG) 12.5 MG tablet Take 1 tablet (12.5 mg total) 2 (two) times daily by mouth. 10/15/17 04/06/18 Yes Deboraha Sprang, MD  Doxylamine Succinate, Sleep, (SLEEP AID PO) Take 1 tablet by mouth at bedtime as needed (sleep).    Yes [provider]  ferrous sulfate 325 (65 FE) MG tablet Take 325 mg by mouth daily with breakfast.   Yes [provider]  furosemide (LASIX) 40 MG tablet Take 1.5 tablets (60 mg total) by mouth 2 (two) times daily. 06/23/17  Yes Larey Dresser, MD  Multiple Vitamin (MULTIVITAMIN WITH MINERALS) TABS Take 1 tablet by mouth daily.   Yes [provider]  potassium chloride SA (K-DUR,KLOR-CON) 20 MEQ tablet Take 1 tablet (20 mEq total) by mouth 2 (two) times daily. 06/05/17  Yes Larey Dresser, MD  sacubitril-valsartan (ENTRESTO) 97-103 MG Take 1 tablet by mouth 2 (two) times daily. 04/16/17  Yes Bensimhon, Shaune Pascal, MD  spironolactone (ALDACTONE) 25 MG tablet Take 1/2 tablet (12.5 mg) by mouth once daily 10/15/17  Yes Deboraha Sprang, MD  amiodarone (PACERONE) 200 MG tablet TAKE 1 AND 1/2 TABLETS(300 MG) BY MOUTH DAILY Patient not taking: Reported on 04/06/2018 03/22/18   Bensimhon, Shaune Pascal, MD    Family History Family History  Problem Relation Age of Onset  . Breast cancer Mother   . Cancer Father     Social History Social History    Tobacco Use  . Smoking status: Never Smoker  . Smokeless tobacco: Never Used  Substance Use Topics  . Alcohol use: Yes    Alcohol/week: 0.6 oz    Types: 1 Glasses of wine per week    Comment: daily  . Drug use: No     Allergies   Patient has no known allergies.   Review of Systems Review of Systems  Constitutional: Positive for diaphoresis. Negative for activity change, chills and fever.  HENT: Negative for sore throat.   Eyes: Negative for visual disturbance.  Respiratory: Positive for shortness of breath. Negative for cough.   Cardiovascular: Positive for chest pain and leg swelling. Negative for palpitations.  Gastrointestinal: Negative for abdominal pain, diarrhea, nausea and vomiting.  Genitourinary: Negative for dysuria.  Musculoskeletal: Negative for back pain.  Skin: Negative for rash.  Allergic/Immunologic: Negative for immunocompromised state.  Neurological: Positive for light-headedness. Negative for dizziness, syncope, speech difficulty, weakness, numbness and headaches.  Psychiatric/Behavioral: Negative for confusion.     Physical Exam Updated Vital Signs BP (!) 133/108   Pulse (!) 56   Temp 97.7  F (36.5 C) (Oral)   Resp 12   Ht 5\' 7"  (1.702 m)   Wt 106.6 kg (235 lb)   SpO2 98%   BMI 36.81 kg/m   Physical Exam  Constitutional: She is oriented to person, place, and time. She appears well-developed and well-nourished. No distress.  HENT:  Head: Normocephalic and atraumatic.  Right Ear: Hearing normal.  Left Ear: Hearing normal.  Nose: Nose normal.  Mouth/Throat: Oropharynx is clear and moist and mucous membranes are normal.  Eyes: Pupils are equal, round, and reactive to light. Conjunctivae and EOM are normal. Right eye exhibits no discharge. Left eye exhibits no discharge. No scleral icterus.  Neck: Normal range of motion. Neck supple. No JVD present. No tracheal deviation present. No thyromegaly present.  Cardiovascular: Normal rate, regular  rhythm, S1 normal, S2 normal, normal heart sounds and intact distal pulses. Exam reveals no gallop and no friction rub.  No murmur heard. Pulmonary/Chest: Effort normal and breath sounds normal. No stridor. No respiratory distress. She has no wheezes. She has no rales. She exhibits no tenderness.  Abdominal: Soft. Normal appearance and bowel sounds are normal. She exhibits no distension and no mass. There is no hepatosplenomegaly. There is no tenderness. There is no rebound, no guarding, no tenderness at McBurney's point and negative Murphy's sign. No hernia.  Musculoskeletal: Normal range of motion. She exhibits no edema, tenderness or deformity.  Lymphadenopathy:    She has no cervical adenopathy.  Neurological: She is alert and oriented to person, place, and time. She has normal strength. No cranial nerve deficit or sensory deficit. Coordination normal. GCS eye subscore is 4. GCS verbal subscore is 5. GCS motor subscore is 6.  Skin: Skin is warm, dry and intact. Capillary refill takes less than 2 seconds. No rash noted. No cyanosis or erythema. No pallor.  Psychiatric: She has a normal mood and affect. Her speech is normal and behavior is normal. Thought content normal.  Nursing note and vitals reviewed.    ED Treatments / Results  Labs (all labs ordered are listed, but only abnormal results are displayed) Labs Reviewed  BASIC METABOLIC PANEL - Abnormal; Notable for the following components:      Result Value   BUN 21 (*)    All other components within normal limits  CBC  D-DIMER, QUANTITATIVE (NOT AT [facility])  BRAIN NATRIURETIC PEPTIDE  I-STAT TROPONIN, ED  I-STAT BETA HCG BLOOD, ED ([facility], [facility], [facility] ONLY)  I-STAT TROPONIN, ED    EKG EKG Interpretation  Date/Time:  Tuesday April 06 2018 09:51:15 EDT Ventricular Rate:  76 PR Interval:  166 QRS Duration: 104 QT Interval:  484 QTC Calculation: 544 R Axis:   -15 Text Interpretation:  Normal sinus rhythm Incomplete right bundle branch  block Nonspecific T wave abnormality Prolonged QT Abnormal ECG prolonged QT is new Confirmed by Varney Biles [phone]) on 04/06/2018 11:22:41 AM   Radiology Dg Chest 2 View  Result Date: 04/06/2018 CLINICAL DATA:  Chest pain EXAM: CHEST - 2 VIEW COMPARISON:  01/15/2016 FINDINGS: AICD unchanged in position. Mild cardiac enlargement without heart failure. Lungs are clear without infiltrate or effusion. IMPRESSION: No active cardiopulmonary disease. Electronically Signed   By: Franchot Gallo M.D.   On: 04/06/2018 10:29    Procedures Procedures (including critical care time)  Medications Ordered in ED Medications  acetaminophen (TYLENOL) tablet 1,000 mg (1,000 mg Oral Refused 04/06/18 1300)     Initial Impression / Assessment and Plan / ED Course  I have reviewed  the triage vital signs and the nursing notes.  Pertinent labs & imaging results that were available during my care of the patient were reviewed by me and considered in my medical decision making (see chart for details).     52 year old female with a history of dilated cardiomyopathy with EF 20-25% s/p ICD, iron deficiency anemia, and HTN with chest pain, diaphoresis, dyspnea, lightheadedness.  Symptoms have improved since onset without treatment.   EKG with prolonged QTC, but no other changes from previous. Pacemaker investigated by [employer] with no recorded incidents today.  They do note nonsustained ventricular tachycardia on April 22.  Chest x-ray is unremarkable.  BNP is normal.  Delta troponin is negative.  D-dimer is negative.  Electrolytes are reassuring.  Reviewed last right and left heart catheterization, which was clear.  Doubt PE, CHF exacerbation, ACS, myocarditis, pericarditis, or cardiac tamponade.  Case has been discussed with and seen by Dr. Kathrynn Humble who agrees with the above plan to discharge.  Spoke with Trish with cardiology who will have someone contact the patient to schedule a follow-up appointment for later  this week.  Pt has been advised to return to the ED if CP becomes exertional, associated with diaphoresis or nausea, radiates to left jaw/arm, worsens or becomes concerning in any way. Pt appears reliable for follow up and is agreeable to discharge.     Final Clinical Impressions(s) / ED Diagnoses   Final diagnoses:  Atypical chest pain    ED Discharge Orders    None       Joanne Gavel, PA-C 04/06/18 Kempner, Ankit, MD 04/07/18 [phone]

## 2018-04-06 NOTE — ED Triage Notes (Signed)
Pt states that she started having CP on the way to work today. Pt states she noticed it initially when she was getting dressed, took her morning heart medications but states the pain then got worse on the drive to work. Central CP radiating to back with SOB, nausea, lightheaded, and some sweating.

## 2018-04-06 NOTE — Discharge Instructions (Addendum)
Someone from the cardiology office will call you with an appointment time for later this week  You can take at thousand milligrams of Tylenol once every 8 hours for pain control.  If you develop worsening symptoms including difficulty breathing, vomiting, chest pain that is worse with activity, or other new concerning symptoms, please return to the emergency department for re-evaluation.

## 2018-04-06 NOTE — ED Notes (Signed)
Monitored pt pulse ox while ambulating, pt is ambulatory with no assistance. O2% was 99 at bedside, fell to 90% when pt stood up but quickly rose to 96%. Fluctuated between 96% and 100% while ambulating. 100% upon returning to bedside.

## 2018-04-15 ENCOUNTER — Telehealth: Payer: Self-pay | Admitting: Cardiology

## 2018-04-15 ENCOUNTER — Ambulatory Visit (INDEPENDENT_AMBULATORY_CARE_PROVIDER_SITE_OTHER): Payer: BLUE CROSS/BLUE SHIELD | Admitting: *Deleted

## 2018-04-15 DIAGNOSIS — I420 Dilated cardiomyopathy: Secondary | ICD-10-CM

## 2018-04-15 NOTE — Telephone Encounter (Signed)
LMOVM reminding pt to send remote transmission.   

## 2018-04-19 NOTE — Progress Notes (Signed)
Remote ICD transmission.   

## 2018-04-20 ENCOUNTER — Encounter: Payer: Self-pay | Admitting: Cardiology

## 2018-04-28 ENCOUNTER — Ambulatory Visit (HOSPITAL_COMMUNITY)
Admission: RE | Admit: 2018-04-28 | Discharge: 2018-04-28 | Disposition: A | Discharge status: Home / Self Care | Payer: Self-pay | Source: Ambulatory Visit | Attending: Internal Medicine | Admitting: Internal Medicine

## 2018-04-28 ENCOUNTER — Encounter: Payer: Self-pay | Admitting: Internal Medicine

## 2018-04-28 VITALS — BP 98/68 | HR 60 | Wt 209.0 lb

## 2018-04-28 DIAGNOSIS — R0789 Other chest pain: Secondary | ICD-10-CM | POA: Insufficient documentation

## 2018-04-28 DIAGNOSIS — Z95810 Presence of automatic (implantable) cardiac defibrillator: Secondary | ICD-10-CM | POA: Insufficient documentation

## 2018-04-28 DIAGNOSIS — I471 Supraventricular tachycardia: Secondary | ICD-10-CM | POA: Insufficient documentation

## 2018-04-28 DIAGNOSIS — I429 Cardiomyopathy, unspecified: Secondary | ICD-10-CM | POA: Insufficient documentation

## 2018-04-28 DIAGNOSIS — R072 Precordial pain: Secondary | ICD-10-CM

## 2018-04-28 DIAGNOSIS — Z79899 Other long term (current) drug therapy: Secondary | ICD-10-CM | POA: Insufficient documentation

## 2018-04-28 DIAGNOSIS — I110 Hypertensive heart disease with heart failure: Secondary | ICD-10-CM | POA: Insufficient documentation

## 2018-04-28 DIAGNOSIS — I5022 Chronic systolic (congestive) heart failure: Secondary | ICD-10-CM | POA: Insufficient documentation

## 2018-04-28 MED ORDER — SPIRONOLACTONE 25 MG PO TABS: 25.0000 mg | ORAL_TABLET | Freq: Every day | ORAL | 6 refills | Status: DC

## 2018-04-28 MED FILL — SPIRONOLACTONE 25 MG TABLET: 25 | 30 days supply | Qty: 30 | Fill #0

## 2018-04-28 NOTE — Patient Instructions (Signed)
INCREASE Spironolactone to 25 mg, one tab daily  Labs needed in 7-10 days  Your physician has requested that you have an echocardiogram. Echocardiography is a painless test that uses sound waves to create images of your heart. It provides your doctor with information about the size and shape of your heart and how well your heart's chambers and valves are working. This procedure takes approximately one hour. There are no restrictions for this procedure.  Your physician recommends that you schedule a follow-up appointment in: 3-6 months with Dr Caryl Comes  Your physician recommends that you schedule a follow-up appointment in: 6 months with Dr Haroldine Laws  Do the following things EVERYDAY: 1) Weigh yourself in the morning before breakfast. Write it down and keep it in a log. 2) Take your medicines as prescribed 3) Eat low salt foods-Limit salt (sodium) to 2000 mg per day.  4) Stay as active as you can everyday 5) Limit all fluids for the day to less than 2 liters   .

## 2018-04-28 NOTE — Progress Notes (Signed)
Advanced Heart Failure Clinic Note   Patient ID: Sydney Shaffer, female   DOB: 03-21-1966, 52 y.o.   MRN: [id number] EP: Dr Caryl Comes [employer] ICD PCP: None   HPI: Sydney Shaffer is a 52 y.o. female with a history of chronic systolic HF due to nonischemic cardiomyopathy s/p [employer] ICD, HTN, and SVT s/p 01/08/14 ablation.  Cath 4/13 normal coronaries    Admitted 02/03/14 with chest pain through 02/05/14. Troponin negative. She had been out of metoprolol. She was discharged on coreg 12.5 mg twice a day, lasix 40 mg twice a day, spiro 12.5 mg daily, and lisinopril 5 mg daily.  Discharge weight 195 pounds.    On May 1 developed CP while driving to work developed CP with diaphoresis and SOB. Was under a lot of stress. Lost her fiancee in March. Troponin and d-dimer negative. BNP 24. ECG with chronic TWI   Today she returns for HF follow up. No further CP. Says she does ok. Just takes her time. Does all ADLs without problem. Still working nights. Mild ankle swelling. Watching her fluid intake closely. Takes extra lasix as needed. No dizziness No ICD shocks  ICD interrogation today: No VT/AF. Optivol ok. Activity ~ 6 hours. Personally reviewed   ECHO 07/05/12 EF 20-25%  ECHO 02/04/14 EF 20-25%  ECHO 6/16 EF 30-35% mild MR ECHO 09/2016 EF 25-30%, Grade 1 DD  RHC and LHC 09/12/16  Normal cors.   RA = 3 RV = 23/4 PA = 22/6 (14) PCW = 8 Ao = 123/69 (91) LV = 106/6 Fick cardiac output/index = 7.4/3.5 PVR = < 1.0 WU SVR = 958  FA sat = 88% PA sat = 63%, 62%  CPX 5/18  FVC 1.99 (61%)    FEV1 1.59 (61%)     FEV1/FVC 80 (99%)     MVV 58 (57%)     Resting HR: 81 Peak HR: 121  (72% age predicted max HR) BP rest: 108/68 BP peak: 144/80 Peak VO2: 12.3 (61% predicted peak VO2) VE/VCO2 slope: 25 OUES: 2.13 Peak RER: 0.98 Ventilatory Threshold: 11.2 (55% predicted or measured peak VO2) VE/MVV: 59% PETCO2 at peak: 40 O2pulse: 10  (91% predicted O2pulse)  CPX 06/03/14: FVC  1.69 (51%)  FEV1 1.24 (47%)  FEV1/FVC 74%  Peak VO2: 12.6 (57.8% predicted peak VO2) VE/VCO2 slope: 28.6 OUES: 1.60 Peak RER: 1.01 Peak VO2 adjusted to the patient's ideal body weight of 150 lb (67.8 kg) the peak VO2 is 16.5 ml/kg (ibw)/min (62% of the ibw-adjusted predicted).  ECG: NSR, iRBBB, nonspecific T wave flattening  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 She does not drink alcohol . Lives alone. She has 3 grown children   FH: Mom breast cancer . Father cancer   ROS: All systems negative except as listed in HPI, PMH and Problem List.  Past Medical History:  Diagnosis Date  . Anemia    felt to be due to heavy menstrual flow  . Atrial tachycardia (HCC)    s/p ablation  . 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 CHF (congestive heart failure) ([place])    Dx 03/2012 - dilated cardiomyopathy with EF 20-25% by echo (abnl nuc but normal coronaries 04/02/12 per cath.   . Chronic systolic heart failure ([place])   . Dysrhythmia    Bradycardia  . Hypertension   . Hypertensive heart disease with CHF ([place])   . ICD (implantable cardiac defibrillator) in place 08/13/2012  Current Outpatient Medications  Medication Sig Dispense Refill  . amiodarone (PACERONE) 200 MG tablet Take 300 mg by mouth daily.    . carvedilol (COREG) 12.5 MG tablet Take 1 tablet (12.5 mg total) 2 (two) times daily by mouth. 180 tablet 3  . Doxylamine Succinate, Sleep, (SLEEP AID PO) Take 1 tablet by mouth at bedtime as needed (sleep).     . ferrous sulfate 325 (65 FE) MG tablet Take 325 mg by mouth daily with breakfast.    . furosemide (LASIX) 40 MG tablet Take 1.5 tablets (60 mg total) by mouth 2 (two) times daily. 90 tablet 5  . Multiple Vitamin (MULTIVITAMIN WITH MINERALS) TABS Take 1 tablet by mouth daily.    . potassium chloride SA (K-DUR,KLOR-CON) 20 MEQ tablet Take 1 tablet (20 mEq total) by mouth 2 (two) times daily. 60 tablet 5  . sacubitril-valsartan  (ENTRESTO) 97-103 MG Take 1 tablet by mouth 2 (two) times daily. 180 tablet 3  . spironolactone (ALDACTONE) 25 MG tablet Take 1/2 tablet (12.5 mg) by mouth once daily     No current facility-administered medications for this encounter.     Vitals:   04/28/18 1337  BP: 98/68  Pulse: 60  SpO2: 98%  Weight: 209 lb (94.8 kg)   Wt Readings from Last 3 Encounters:  04/28/18 209 lb (94.8 kg)  04/06/18 235 lb (106.6 kg)  10/15/17 208 lb (94.3 kg)     PHYSICAL EXAM: General:  Well appearing. No resp difficulty HEENT: normal Neck: supple. no JVD. Carotids 2+ bilat; no bruits. No lymphadenopathy or thryomegaly appreciated. Cor: PMI nondisplaced. Regular rate & rhythm. No rubs, gallops or murmurs. Lungs: clear Abdomen: soft, nontender, nondistended. No hepatosplenomegaly. No bruits or masses. Good bowel sounds. Extremities: no cyanosis, clubbing, rash, edema Neuro: alert & orientedx3, cranial nerves grossly intact. moves all 4 extremities w/o difficulty. Affect pleasant   ASSESSMENT & PLAN:  1. Chronic Systolic Heart Failure: Normal coronaries by cath 2013. Nonischemic cardiomyopathy s/p [employer]tr[employer] ICD. EF 30-35% (05/2015 echo). Echo 10/17 25-30%   - Overall stable NYHA II-early III  - Volume status looks good. REDS 32 at last visit.  - ICD interrogation today  no VT/AF Optivol ok  Activity 4-6 hours/day - Continue entresto 97-103 mg twice a day.   - Increase spironolactone to 25 mg daily. BMET 1 week - Continue  Coreg 12.5  mg bid.              - Repeat echo                                                                                                    -  2. SVT an VT: s/p ablation for atrial tachycardia in 01/2014.  Followed by Dr. Caryl Comes. Last seen in 11/18 Amio continued.  - Continue amiodarone 300 mg daily.  - Given young age - would like to switch agents  If possible. Will d/w Dr. Caryl Comes 3. Chest pain - Atypical. Work-up negative. No recurrence. Suspect stress related. Cors were  normal in 2013. No indication for relook currently.  Glori Bickers, MD 04/28/2018

## 2018-05-05 ENCOUNTER — Ambulatory Visit (HOSPITAL_COMMUNITY)
Admission: RE | Admit: 2018-05-05 | Discharge: 2018-05-05 | Disposition: A | Discharge status: Home / Self Care | Payer: Self-pay | Source: Ambulatory Visit | Attending: Internal Medicine | Admitting: Internal Medicine

## 2018-05-05 DIAGNOSIS — I5022 Chronic systolic (congestive) heart failure: Secondary | ICD-10-CM | POA: Insufficient documentation

## 2018-05-05 LAB — BASIC METABOLIC PANEL
Anion gap: 6 (ref 5–15)
BUN: 17 mg/dL (ref 6–20)
CO2: 28 mmol/L (ref 22–32)
Calcium: 9.8 mg/dL (ref 8.9–10.3)
Chloride: 105 mmol/L (ref 101–111)
Creatinine, Ser: 0.98 mg/dL (ref 0.44–1.00)
GFR calc Af Amer: >60 mL/min (ref >60)
GLUCOSE: 119 mg/dL — AB (ref 65–99)
POTASSIUM: 3.9 mmol/L (ref 3.5–5.1)
Sodium: 139 mmol/L (ref 135–145)

## 2018-05-12 ENCOUNTER — Ambulatory Visit (HOSPITAL_COMMUNITY)
Admission: RE | Admit: 2018-05-12 | Discharge: 2018-05-12 | Disposition: A | Discharge status: Home / Self Care | Payer: Self-pay | Source: Ambulatory Visit | Attending: Internal Medicine | Admitting: Internal Medicine

## 2018-05-12 DIAGNOSIS — I110 Hypertensive heart disease with heart failure: Secondary | ICD-10-CM | POA: Insufficient documentation

## 2018-05-12 DIAGNOSIS — I340 Nonrheumatic mitral (valve) insufficiency: Secondary | ICD-10-CM | POA: Insufficient documentation

## 2018-05-12 DIAGNOSIS — I5022 Chronic systolic (congestive) heart failure: Secondary | ICD-10-CM | POA: Insufficient documentation

## 2018-05-12 DIAGNOSIS — Z95810 Presence of automatic (implantable) cardiac defibrillator: Secondary | ICD-10-CM | POA: Insufficient documentation

## 2018-05-12 NOTE — Progress Notes (Signed)
  Echocardiogram 2D Echocardiogram has been performed.  Jennette Dubin 05/12/2018, 2:59 PM

## 2018-05-13 LAB — CUP PACEART REMOTE DEVICE CHECK
Battery Voltage: 2.91 V
Brady Statistic AP VP Percent: 0 %
Brady Statistic AS VS Percent: 99.6 %
Brady Statistic RA Percent Paced: 0.37 %
Date Time Interrogation Session: 20190510021049
HIGH POWER IMPEDANCE MEASURED VALUE: 74 Ohm
HighPow Impedance: 304 Ohm
Implantable Lead Implant Date: 20130906
Implantable Lead Serial Number: 322962
Implantable Pulse Generator Implant Date: 20130906
Lead Channel Pacing Threshold Amplitude: 0.5 V
Lead Channel Pacing Threshold Pulse Width: 0.4 ms
Lead Channel Sensing Intrinsic Amplitude: 2 mV
Lead Channel Sensing Intrinsic Amplitude: 9.625 mV
Lead Channel Setting Sensing Sensitivity: 0.3 mV
MDC IDC LEAD IMPLANT DT: 20130906
MDC IDC LEAD LOCATION: 753859
MDC IDC LEAD LOCATION: 753860
MDC IDC MSMT LEADCHNL RA IMPEDANCE VALUE: 399 Ohm
MDC IDC MSMT LEADCHNL RA SENSING INTR AMPL: 2 mV
MDC IDC MSMT LEADCHNL RV IMPEDANCE VALUE: 342 Ohm
MDC IDC MSMT LEADCHNL RV PACING THRESHOLD AMPLITUDE: 0.875 V
MDC IDC MSMT LEADCHNL RV PACING THRESHOLD PULSEWIDTH: 0.4 ms
MDC IDC MSMT LEADCHNL RV SENSING INTR AMPL: 9.625 mV
MDC IDC SET LEADCHNL RA PACING AMPLITUDE: 2 V
MDC IDC SET LEADCHNL RV PACING AMPLITUDE: 2.5 V
MDC IDC SET LEADCHNL RV PACING PULSEWIDTH: 0.4 ms
MDC IDC STAT BRADY AP VS PERCENT: 0.36 %
MDC IDC STAT BRADY AS VP PERCENT: 0.03 %
MDC IDC STAT BRADY RV PERCENT PACED: 0.03 %

## 2018-07-01 ENCOUNTER — Other Ambulatory Visit (HOSPITAL_COMMUNITY): Payer: Self-pay | Admitting: Internal Medicine

## 2018-07-01 MED FILL — SPIRONOLACTONE 25 MG TABLET: 25 | 30 days supply | Qty: 30 | Fill #1

## 2018-07-05 MED FILL — POTASSIUM CL ER 20 MEQ TAB: 20 | 30 days supply | Qty: 60 | Fill #0

## 2018-07-15 ENCOUNTER — Encounter: Payer: Self-pay | Admitting: *Deleted

## 2018-07-16 ENCOUNTER — Telehealth: Payer: Self-pay

## 2018-07-16 NOTE — Telephone Encounter (Signed)
LMOVM reminding pt to send remote transmission.   

## 2018-07-20 ENCOUNTER — Encounter: Payer: Self-pay | Admitting: Cardiology

## 2018-07-26 ENCOUNTER — Ambulatory Visit (INDEPENDENT_AMBULATORY_CARE_PROVIDER_SITE_OTHER): Payer: Self-pay | Admitting: *Deleted

## 2018-07-26 ENCOUNTER — Telehealth: Payer: Self-pay | Admitting: *Deleted

## 2018-07-26 DIAGNOSIS — I420 Dilated cardiomyopathy: Secondary | ICD-10-CM

## 2018-07-26 NOTE — Telephone Encounter (Signed)
Transmission received 07/25/18 at 2234. 3 episodes VT with ATP on 06/13/18. Per Dr. Caryl Comes these episodes are a dual tachycardia in which ATP dissociated As and Vs and terminated VT.

## 2018-07-26 NOTE — Telephone Encounter (Signed)
Pt was returning your phone call. The best number for her is [phone]

## 2018-07-26 NOTE — Telephone Encounter (Signed)
Returned call to Sydney Shaffer- I made her aware of episodes on 06/13/18 in the afternoon. She recalls that day as she was going up and down the stairs in her apartment building to do laundry. She recalls feeling tired, short of breath and dizzy. She sat down for about 30 minutes and the symptoms resolved.   I will call her back with any recommendations from Dr. Caryl Comes. She verbalizes understanding.

## 2018-07-27 NOTE — Progress Notes (Signed)
Remote ICD transmission.   

## 2018-08-03 ENCOUNTER — Other Ambulatory Visit: Payer: Self-pay | Admitting: Internal Medicine

## 2018-08-11 MED FILL — SPIRONOLACTONE 25 MG TABLET: 25 | 30 days supply | Qty: 30 | Fill #2

## 2018-08-19 ENCOUNTER — Other Ambulatory Visit (HOSPITAL_COMMUNITY): Payer: Self-pay | Admitting: *Deleted

## 2018-08-19 DIAGNOSIS — I5022 Chronic systolic (congestive) heart failure: Secondary | ICD-10-CM

## 2018-08-19 MED ORDER — FUROSEMIDE 40 MG PO TABS
60.0000 mg | ORAL_TABLET | Freq: Two times a day (BID) | ORAL | 5 refills | Status: DC
Start: 2018-08-19 — End: 2020-02-15

## 2018-08-20 LAB — CUP PACEART REMOTE DEVICE CHECK
Brady Statistic AP VP Percent: 0 %
Brady Statistic AP VS Percent: 0.33 %
Brady Statistic AS VP Percent: 0.03 %
Brady Statistic AS VS Percent: 99.64 %
Date Time Interrogation Session: 20190819023423
HIGH POWER IMPEDANCE MEASURED VALUE: 304 Ohm
HIGH POWER IMPEDANCE MEASURED VALUE: 65 Ohm
Implantable Lead Location: 753860
Implantable Lead Serial Number: 322962
Implantable Pulse Generator Implant Date: 20130906
Lead Channel Impedance Value: 399 Ohm
Lead Channel Pacing Threshold Amplitude: 1 V
Lead Channel Pacing Threshold Pulse Width: 0.4 ms
Lead Channel Sensing Intrinsic Amplitude: 8.625 mV
Lead Channel Sensing Intrinsic Amplitude: 8.625 mV
Lead Channel Setting Pacing Amplitude: 2.5 V
Lead Channel Setting Pacing Pulse Width: 0.4 ms
Lead Channel Setting Sensing Sensitivity: 0.3 mV
MDC IDC LEAD IMPLANT DT: 20130906
MDC IDC LEAD IMPLANT DT: 20130906
MDC IDC LEAD LOCATION: 753859
MDC IDC MSMT BATTERY VOLTAGE: 2.85 V
MDC IDC MSMT LEADCHNL RA PACING THRESHOLD AMPLITUDE: 0.5 V
MDC IDC MSMT LEADCHNL RA PACING THRESHOLD PULSEWIDTH: 0.4 ms
MDC IDC MSMT LEADCHNL RA SENSING INTR AMPL: 1.75 mV
MDC IDC MSMT LEADCHNL RA SENSING INTR AMPL: 1.75 mV
MDC IDC MSMT LEADCHNL RV IMPEDANCE VALUE: 342 Ohm
MDC IDC SET LEADCHNL RA PACING AMPLITUDE: 2 V
MDC IDC STAT BRADY RA PERCENT PACED: 0.33 %
MDC IDC STAT BRADY RV PERCENT PACED: 0.03 %

## 2018-08-27 ENCOUNTER — Telehealth: Payer: Self-pay | Admitting: *Deleted

## 2018-08-27 ENCOUNTER — Ambulatory Visit (INDEPENDENT_AMBULATORY_CARE_PROVIDER_SITE_OTHER): Payer: Self-pay | Admitting: *Deleted

## 2018-08-27 DIAGNOSIS — I5022 Chronic systolic (congestive) heart failure: Secondary | ICD-10-CM

## 2018-08-27 DIAGNOSIS — I420 Dilated cardiomyopathy: Secondary | ICD-10-CM

## 2018-08-27 NOTE — Telephone Encounter (Signed)
LVM/sss 

## 2018-08-27 NOTE — Telephone Encounter (Signed)
Remote transmission reviewed. Presenting rhythm: AsVs @74bpm . Batt voltage 2.79V (ERI 2.63V). No atrial arrhythmias` noted. (2) treated VT episodes from 08/23/18 noted - terminated after several attempts of ATP. Stable thoracic impedance. Normal ICD function. Patient alert d/t unsuccessful transmission. Will discuss episodes with Dr.Taylor, as Dr.Klein is off today.   Patient returned call. Informed patient of the episodes requiring ATP from 9/16. Informed patient about the alert d/t unsuccessful transmission. Patient verbalized understanding.  Device clinic appointment scheduled for today @ 1500. Patient verbalized understanding.  Informed patient of DMV driving restriction. Again, patient verbalized understanding.

## 2018-08-27 NOTE — Telephone Encounter (Signed)
Patient states that she heard the audible alert tone this morning. Instructed patient to send a remote transmission for review. Verbal instructions provided. Patient verbalized understanding.

## 2018-08-30 ENCOUNTER — Ambulatory Visit (INDEPENDENT_AMBULATORY_CARE_PROVIDER_SITE_OTHER): Payer: Self-pay | Admitting: Internal Medicine

## 2018-08-30 ENCOUNTER — Encounter: Payer: Self-pay | Admitting: Internal Medicine

## 2018-08-30 VITALS — BP 112/74 | HR 60 | Ht 67.0 in | Wt 207.8 lb

## 2018-08-30 DIAGNOSIS — I471 Supraventricular tachycardia: Secondary | ICD-10-CM

## 2018-08-30 DIAGNOSIS — I472 Ventricular tachycardia, unspecified: Secondary | ICD-10-CM

## 2018-08-30 DIAGNOSIS — I420 Dilated cardiomyopathy: Secondary | ICD-10-CM

## 2018-08-30 DIAGNOSIS — I5022 Chronic systolic (congestive) heart failure: Secondary | ICD-10-CM

## 2018-08-30 DIAGNOSIS — Z95810 Presence of automatic (implantable) cardiac defibrillator: Secondary | ICD-10-CM

## 2018-08-30 LAB — CUP PACEART INCLINIC DEVICE CHECK
Battery Voltage: 2.78 V
Brady Statistic AP VP Percent: 0 %
Brady Statistic AP VS Percent: 0.27 %
Brady Statistic AS VP Percent: 0.03 %
Brady Statistic RA Percent Paced: 0.28 %
HighPow Impedance: 342 Ohm
HighPow Impedance: 64 Ohm
Implantable Lead Implant Date: 20130906
Implantable Lead Implant Date: 20130906
Implantable Lead Location: 753860
Implantable Lead Location: 753859
Implantable Lead Model: 5076
Implantable Lead Model: 181
Implantable Lead Model: 5076
Implantable Lead Serial Number: 322962
Implantable Lead Serial Number: 322962
Lead Channel Impedance Value: 399 Ohm
Lead Channel Pacing Threshold Amplitude: 0.75 V
Lead Channel Pacing Threshold Pulse Width: 0.4 ms
Lead Channel Sensing Intrinsic Amplitude: 10.125 mV
Lead Channel Sensing Intrinsic Amplitude: 11.75 mV
Lead Channel Sensing Intrinsic Amplitude: 2.375 mV
Lead Channel Setting Pacing Amplitude: 2 V
Lead Channel Setting Pacing Amplitude: 2.5 V
Lead Channel Setting Pacing Pulse Width: 0.4 ms
Lead Channel Setting Sensing Sensitivity: 0.3 mV
MDC IDC LEAD IMPLANT DT: 20130906
MDC IDC LEAD IMPLANT DT: 20130906
MDC IDC LEAD LOCATION: 753859
MDC IDC LEAD LOCATION: 753860
MDC IDC MSMT LEADCHNL RA PACING THRESHOLD AMPLITUDE: 0.5 V
MDC IDC MSMT LEADCHNL RA PACING THRESHOLD PULSEWIDTH: 0.4 ms
MDC IDC MSMT LEADCHNL RA SENSING INTR AMPL: 1.5 mV
MDC IDC MSMT LEADCHNL RV IMPEDANCE VALUE: 342 Ohm
MDC IDC PG IMPLANT DT: 20130906
MDC IDC PG IMPLANT DT: 20130906
MDC IDC SESS DTM: 20190920195130
MDC IDC SESS DTM: 20190923170637
MDC IDC STAT BRADY AS VS PERCENT: 99.69 %
MDC IDC STAT BRADY RV PERCENT PACED: 0.03 %

## 2018-08-30 MED FILL — SPIRONOLACTONE 25 MG TABLET: 25 | 30 days supply | Qty: 30 | Fill #3

## 2018-08-30 MED FILL — POTASSIUM CL ER 20 MEQ TAB: 20 | 30 days supply | Qty: 60 | Fill #1

## 2018-08-30 NOTE — Patient Instructions (Signed)
Medication Instructions:  Your physician has recommended you make the following change in your medication:   1. Decrease Amiodarone to one tablet, 200mg , once per day.  Labwork: You will have labs drawn today: BMP, TSH, and LFT   Testing/Procedures: None ordered.  Follow-Up: Your physician wants you to follow-up in: 6 months with Dr Caryl Comes. You will receive a reminder letter in the mail two months in advance. If you don't receive a letter, please call our office to schedule the follow-up appointment.  Remote monitoring is used to monitor your Pacemaker of ICD from home. This monitoring reduces the number of office visits required to check your device to one time per year. It allows [place] to keep an eye on the functioning of your device to ensure it is working properly. You are scheduled for a device check from home on 10/25/2018. You may send your transmission at any time that day. If you have a wireless device, the transmission will be sent automatically. After your physician reviews your transmission, you will receive a postcard with your next transmission date.    Any Other Special Instructions Will Be Listed Below (If Applicable).     If you need a refill on your cardiac medications before your next appointment, please call your pharmacy.

## 2018-08-30 NOTE — Progress Notes (Signed)
ICD check in clinic d/t alert tone for unsuccessful CL alert transmission/VT therapies exhausted for an episode. Normal device function. Thresholds and sensing consistent with previous device measurements. Impedance trends stable over time. (5) treated ventricular arrhythmias since 04/06/18, most recent on 08/23/18 + sx's ("just didn't feel good") - initial ATPx6 was unsuccessful, FVT ATP x3 - successful. (15) VT-NS episodes. Patient reports medication compliance. (1) SVT episode recorded. No mode switches. Histogram distribution appropriate for patient and level of activity. Stable thoracic impedance. No changes made this session. Device programmed at appropriate safety margins. Device programmed to optimize intrinsic conduction. Batt voltage 2.78V (ERI 2.63V). Enrolled in remote follow-up. Patient will follow up with SK on 9/23 @1315  d/t recent episodes - ok per GT. Patient education completed including shock plan. Alert tones demonstrated for patient. Patient aware of driving restriction x 6 months.

## 2018-08-30 NOTE — Progress Notes (Signed)
Patient Care Team: Patient, No Pcp Per as PCP - General (General Practice)   HPI  Sydney Shaffer is a 52 y.o. female Seen in followup for ICD implanted Sept 2013 for primary prevention in the setting of nonischemic cardiomyopathy. QRS was narrow.    She has a history of SVT ablation in 2014.  At this study, she was found to have a tachycardia focus in the region of the AV node which was successfully ablated.  She also had typical AV nodal reentry and successful slow pathway elimination.  When seen 2017 atrial arrhythmia was noted to have recurred.  She has been maintained on amiodarone.  She is also had recurrent tachycardia Rx w ATP 8/19.  This was quite symptomatic.  With lightheadedness and weakness.    DATE TEST EF   7/13 Echo  20-25 %   2/15 Echo 20-25 %   10/17 Echo  30-35   10/17 Cath  Normal CA  6/19 Echo  30-35%       Date Cr K Hgb TSH LFTs PFTs  9/17      2.555 22    1 /  18 0.83 4.5           Very sleepy strugllling with grief following loss of fiance  No edema  No chest pain  Some SOB    Past Medical History:  Diagnosis Date  . Anemia    felt to be due to heavy menstrual flow  . Atrial tachycardia (HCC)    s/p ablation  . AVNRT (AV nodal re-entry tachycardia) ([place])    s/p ablation  . CHF (congestive heart failure) ([place])    Dx 03/2012 - dilated cardiomyopathy with EF 20-25% by echo (abnl nuc but normal coronaries 04/02/12 per cath.   . Chronic systolic heart failure ([place])   . Dysrhythmia    Bradycardia  . Hypertension   . Hypertensive heart disease with CHF ([place])   . ICD (implantable cardiac defibrillator) in place 08/13/2012    Past Surgical History:  Procedure Laterality Date  . APPENDECTOMY    . CARDIAC CATHETERIZATION  April 2013   normal coronaries  . CARDIAC CATHETERIZATION [name] 09/12/2016   Procedure: Right/Left Heart Cath and Coronary Angiography;  Surgeon: Jolaine Artist, MD;  Location: [place] [facility];  Service:  Cardiovascular;  Laterality: [name];  . DILITATION & CURRETTAGE/HYSTROSCOPY WITH VERSAPOINT RESECTION [name] 10/05/2015   Procedure: DILATATION & CURETTAGE/HYSTEROSCOPY WITH VERSAPOINT RESECTION;  Surgeon: [name], MD;  Location: [facility];  Service: Gynecology;  Laterality: [name];  . EP study and ablation  01/07/13   Ablation of AVNRT and atrial tachycardia (arising from the anteroseptal RA 52mm above the HIS)  . ICD  08/13/2012  . IMPLANTABLE CARDIOVERTER DEFIBRILLATOR IMPLANT [name] 08/13/2012   Procedure: IMPLANTABLE CARDIOVERTER DEFIBRILLATOR IMPLANT;  Surgeon: Deboraha Sprang, MD;  Location: [facility] CATH LAB;  Service: Cardiovascular;  Laterality: [name];  . LEFT HEART CATHETERIZATION WITH CORONARY ANGIOGRAM [name] 04/02/2012   Procedure: LEFT HEART CATHETERIZATION WITH CORONARY ANGIOGRAM;  Surgeon: Peter M Martinique, MD;  Location: [facility] CATH LAB;  Service: Cardiovascular;  Laterality: [name];  . SUPRAVENTRICULAR TACHYCARDIA ABLATION [name] 01/07/2013   Procedure: SUPRAVENTRICULAR TACHYCARDIA ABLATION;  Surgeon: Thompson Grayer, MD;  Location: [facility] CATH LAB;  Service: Cardiovascular;  Laterality: [name];  . TUBAL LIGATION      Current Outpatient Medications  Medication Sig Dispense Refill  . amiodarone (PACERONE) 200 MG tablet Take 200 mg by mouth daily.    . carvedilol (  COREG) 12.5 MG tablet Take 1 tablet (12.5 mg total) 2 (two) times daily by mouth. 180 tablet 3  . Doxylamine Succinate, Sleep, (SLEEP AID PO) Take 1 tablet by mouth at bedtime as needed (sleep).     . ferrous sulfate 325 (65 FE) MG tablet Take 325 mg by mouth daily with breakfast.    . furosemide (LASIX) 40 MG tablet Take 1.5 tablets (60 mg total) by mouth 2 (two) times daily. 90 tablet 5  . KLOR-CON M20 20 MEQ tablet TAKE 1 TABLET (20 MEQ TOTAL) BY MOUTH 2 (TWO) TIMES DAILY. 60 tablet 5  . Multiple Vitamin (MULTIVITAMIN WITH MINERALS) TABS Take 1 tablet by mouth daily.    . sacubitril-valsartan (ENTRESTO) 97-103 MG Take 1 tablet by mouth 2 (two) times daily.  180 tablet 3  . spironolactone (ALDACTONE) 25 MG tablet Take 1 tablet (25 mg total) by mouth daily. 30 tablet 6   No current facility-administered medications for this visit.     No Known Allergies  Review of Systems negative except from HPI and PMH  Physical Exam BP 112/74   Pulse 60   Ht 5\' 7"  (1.702 m)   Wt 207 lb 12.8 oz (94.3 kg)   SpO2 95%   BMI 32.55 kg/m   Well developed and nourished in no acute distress HENT normal Neck supple with JVP-flat Clear Regular rate and rhythm, no murmurs or gallops Abd-soft with active BS No Clubbing cyanosis edema Skin-warm and dry A & Oriented  Grossly normal sensory and motor function All lives with   ECG demonstrates  Sinus @ 60 16/10/44  Assessment and  Plan  Nonischemic cardiomyopathy  Atrial Tachycardia/Flutter (CL310)  ICD [employer]tronic The patient's device was interrogated and the information was fully reviewed.  The device was reprogrammed to  Lower rate limit from 60--50  CHF chronic systolic  Ventricular tachycardia-CL 330  Euvolemic continue current meds  She has had intercurrent ventricular tachycardia.  It lasted 3+ minutes; we have reprogrammed the device to detect this.     She also had recurrent therpay earlier this summer.  Looking at it, it appears to be atrial tachycardia with A-V dissociation during ATP and then a ramp of ATP resulting in preexcitation of the atrium termination of tachycardia.  Given her young age, we would like to get her off of the amiodarone; we will decrease her from 300--200 mg.  We will check her surveillance laboratories today.  I have reviewed her tracings with Dr. Greggory Brandy who is glad to see her again for consideration of repeat ablation.  Euvolemic continue current meds  Encouraged her and her grief counseling as well as sleep therapy suggested melatonin in conjunction with her diphenhydramine   More than 50% of 45 min was spent in counseling related to the above

## 2018-08-31 LAB — HEPATIC FUNCTION PANEL
ALK PHOS: 73 IU/L (ref 39–117)
ALT: 15 IU/L (ref 0–32)
AST: 15 IU/L (ref 0–40)
Albumin: 3.9 g/dL (ref 3.5–5.5)
Bilirubin Total: <0.2 mg/dL (ref 0.0–1.2)
Bilirubin, Direct: 0.09 mg/dL (ref 0.00–0.40)
TOTAL PROTEIN: 6.8 g/dL (ref 6.0–8.5)

## 2018-08-31 LAB — BASIC METABOLIC PANEL
BUN/Creatinine Ratio: 17 (ref 9–23)
BUN: 13 mg/dL (ref 6–24)
CO2: 24 mmol/L (ref 20–29)
CREATININE: 0.78 mg/dL (ref 0.57–1.00)
Calcium: 9.5 mg/dL (ref 8.7–10.2)
Chloride: 107 mmol/L — ABNORMAL HIGH (ref 96–106)
GFR calc Af Amer: 100 mL/min/{1.73_m2} (ref >59)
GFR, EST NON AFRICAN AMERICAN: 87 mL/min/{1.73_m2} (ref >59)
GLUCOSE: 99 mg/dL (ref 65–99)
Potassium: 4.6 mmol/L (ref 3.5–5.2)
SODIUM: 142 mmol/L (ref 134–144)

## 2018-08-31 LAB — TSH: TSH: 2.9 u[IU]/mL (ref 0.450–4.500)

## 2018-09-13 ENCOUNTER — Encounter: Payer: Self-pay | Admitting: Internal Medicine

## 2018-10-05 ENCOUNTER — Telehealth (HOSPITAL_COMMUNITY): Payer: Self-pay

## 2018-10-05 MED FILL — SPIRONOLACTONE 25 MG TABLET: 25 | 30 days supply | Qty: 30 | Fill #3

## 2018-10-05 MED FILL — POTASSIUM CL ER 20 MEQ TAB: 20 | 30 days supply | Qty: 60 | Fill #1

## 2018-10-05 NOTE — Telephone Encounter (Signed)
Mrs. Gilder admits to having difficulty remembering doses. She stated she has improved since she obtained a pill box.   Patient denies any new side effects. She tries to limit her fluids by "turning to ice". She reported a little swelling in stomach/ankles at random times and tries to stay off her feet and takes furosemide which helps.   She stated "I dropped the ball on that one recently" when I asked about limiting salt. She knows Lays potato chips are not good for her but have succumbed to these urges in the past week.   Ricardo Jericho PharmD Candidate [employer] [place] [employer] Class of [phone]

## 2018-10-25 ENCOUNTER — Telehealth: Payer: Self-pay

## 2018-10-25 NOTE — Telephone Encounter (Signed)
Spoke with pt and reminded pt of remote transmission that is due today. Pt verbalized understanding.   

## 2018-10-28 ENCOUNTER — Encounter: Payer: Self-pay | Admitting: Cardiology

## 2018-11-15 ENCOUNTER — Ambulatory Visit (INDEPENDENT_AMBULATORY_CARE_PROVIDER_SITE_OTHER): Payer: Self-pay

## 2018-11-15 DIAGNOSIS — I5022 Chronic systolic (congestive) heart failure: Secondary | ICD-10-CM

## 2018-11-15 DIAGNOSIS — I420 Dilated cardiomyopathy: Secondary | ICD-10-CM

## 2018-11-16 NOTE — Progress Notes (Signed)
Remote ICD transmission.   

## 2018-11-25 ENCOUNTER — Telehealth (HOSPITAL_COMMUNITY): Payer: Self-pay

## 2018-11-25 MED FILL — POTASSIUM CHLORIDE CRYS ER: 20 | 30 days supply | Qty: 60 | Fill #2

## 2018-11-25 MED FILL — SPIRONOLACTONE 25 MG TABLET: 25 | 30 days supply | Qty: 30 | Fill #4

## 2018-11-25 NOTE — Telephone Encounter (Signed)
Sydney Shaffer reported her heart was racing on Tuesday d/t missed beta-blocker dose. I emphasized the importance of not missing doses to prevent this.   She reports cutting out salt completely and still trying to improve on limiting fluids. Denies edema/SOB/changes in wt. Going to pick up samples of Entresto from [facility] today. Refilled meds that she needed while she was in this area.   Ricardo Jericho  PharmD Candidate  [employer] [place] [employer]  Class of [phone]

## 2018-12-29 LAB — CUP PACEART REMOTE DEVICE CHECK
Battery Voltage: 2.74 V
Brady Statistic AP VP Percent: 0 %
Brady Statistic AP VS Percent: 0.21 %
Brady Statistic AS VP Percent: 0.03 %
Brady Statistic RA Percent Paced: 0.21 %
Brady Statistic RV Percent Paced: 0.03 %
HighPow Impedance: 304 Ohm
HighPow Impedance: 79 Ohm
Implantable Lead Implant Date: 20130906
Implantable Lead Implant Date: 20130906
Implantable Lead Model: 181
Implantable Lead Model: 5076
Implantable Lead Serial Number: 322962
Lead Channel Impedance Value: 304 Ohm
Lead Channel Impedance Value: 399 Ohm
Lead Channel Pacing Threshold Amplitude: 0.75 V
Lead Channel Pacing Threshold Pulse Width: 0.4 ms
Lead Channel Sensing Intrinsic Amplitude: 1.875 mV
Lead Channel Sensing Intrinsic Amplitude: 11 mV
Lead Channel Sensing Intrinsic Amplitude: 11 mV
Lead Channel Setting Pacing Amplitude: 2 V
Lead Channel Setting Pacing Pulse Width: 0.4 ms
Lead Channel Setting Sensing Sensitivity: 0.3 mV
MDC IDC LEAD LOCATION: 753859
MDC IDC LEAD LOCATION: 753860
MDC IDC MSMT LEADCHNL RA PACING THRESHOLD AMPLITUDE: 0.375 V
MDC IDC MSMT LEADCHNL RA SENSING INTR AMPL: 1.875 mV
MDC IDC MSMT LEADCHNL RV PACING THRESHOLD PULSEWIDTH: 0.4 ms
MDC IDC PG IMPLANT DT: 20130906
MDC IDC SESS DTM: 20191208171222
MDC IDC SET LEADCHNL RV PACING AMPLITUDE: 2.5 V
MDC IDC STAT BRADY AS VS PERCENT: 99.76 %

## 2019-01-14 MED FILL — POTASSIUM CHLORIDE CRYS ER: 20 | 30 days supply | Qty: 60 | Fill #3 | Status: TO

## 2019-01-14 MED FILL — SPIRONOLACTONE 25 MG TABLET: 25 | 30 days supply | Qty: 30 | Fill #5 | Status: TO

## 2019-02-14 ENCOUNTER — Ambulatory Visit (INDEPENDENT_AMBULATORY_CARE_PROVIDER_SITE_OTHER): Payer: Self-pay | Admitting: *Deleted

## 2019-02-14 DIAGNOSIS — I5022 Chronic systolic (congestive) heart failure: Secondary | ICD-10-CM

## 2019-02-14 DIAGNOSIS — I420 Dilated cardiomyopathy: Secondary | ICD-10-CM

## 2019-02-15 LAB — CUP PACEART REMOTE DEVICE CHECK
Battery Voltage: 2.66 V
Brady Statistic AP VP Percent: 0 %
Brady Statistic AP VS Percent: 0.18 %
Brady Statistic AS VP Percent: 0.03 %
Brady Statistic AS VS Percent: 99.78 %
HIGH POWER IMPEDANCE MEASURED VALUE: 78 Ohm
HighPow Impedance: 342 Ohm
Implantable Lead Implant Date: 20130906
Implantable Lead Implant Date: 20130906
Implantable Lead Location: 753860
Implantable Lead Serial Number: 322962
Lead Channel Impedance Value: 399 Ohm
Lead Channel Pacing Threshold Amplitude: 0.5 V
Lead Channel Pacing Threshold Pulse Width: 0.4 ms
Lead Channel Pacing Threshold Pulse Width: 0.4 ms
Lead Channel Sensing Intrinsic Amplitude: 11.125 mV
Lead Channel Sensing Intrinsic Amplitude: 11.125 mV
Lead Channel Setting Pacing Amplitude: 2 V
Lead Channel Setting Pacing Amplitude: 2.5 V
Lead Channel Setting Sensing Sensitivity: 0.3 mV
MDC IDC LEAD LOCATION: 753859
MDC IDC MSMT LEADCHNL RA SENSING INTR AMPL: 1.375 mV
MDC IDC MSMT LEADCHNL RA SENSING INTR AMPL: 1.375 mV
MDC IDC MSMT LEADCHNL RV IMPEDANCE VALUE: 361 Ohm
MDC IDC MSMT LEADCHNL RV PACING THRESHOLD AMPLITUDE: 0.875 V
MDC IDC PG IMPLANT DT: 20130906
MDC IDC SESS DTM: 20200310054445
MDC IDC SET LEADCHNL RV PACING PULSEWIDTH: 0.4 ms
MDC IDC STAT BRADY RA PERCENT PACED: 0.19 %
MDC IDC STAT BRADY RV PERCENT PACED: 0.03 %

## 2019-02-22 NOTE — Progress Notes (Signed)
Remote ICD transmission.   

## 2019-03-04 ENCOUNTER — Other Ambulatory Visit: Payer: Self-pay | Admitting: Internal Medicine

## 2019-03-04 ENCOUNTER — Telehealth (HOSPITAL_COMMUNITY): Payer: Self-pay | Admitting: Cardiology

## 2019-03-04 MED ORDER — SACUBITRIL-VALSARTAN 97-103 MG PO TABS: 1.0000 | ORAL_TABLET | Freq: Two times a day (BID) | ORAL | 0 refills | Status: DC

## 2019-03-04 NOTE — Telephone Encounter (Signed)
Patient called to request entresto samples until shipment is received from [employer], reports she had to submit income paperwork and has not heard anything back  Advised at this time unfortunately we are out of samples, can send 30 day supply to local pharmacy and she can pick up as many pills as she can afford. She has already used the FREE 30 day card. Will also follow up on PA application  Pt voiced understanding

## 2019-03-15 MED FILL — SPIRONOLACTONE 25 MG TABS: 25 | 30 days supply | Qty: 30 | Fill #0

## 2019-03-15 MED FILL — POTASSIUM CHLORIDE CRYS ER: 20 | 30 days supply | Qty: 60 | Fill #0 | Status: TO

## 2019-05-09 ENCOUNTER — Other Ambulatory Visit (HOSPITAL_COMMUNITY): Payer: Self-pay | Admitting: Internal Medicine

## 2019-05-09 MED FILL — SPIRONOLACTONE 25 MG TABLET: 25 | 30 days supply | Qty: 30 | Fill #0

## 2019-05-11 MED FILL — POTASSIUM CHLORIDE CRYS ER: 20 | 30 days supply | Qty: 60 | Fill #0

## 2019-05-16 ENCOUNTER — Encounter: Payer: Self-pay | Admitting: *Deleted

## 2019-05-17 ENCOUNTER — Telehealth: Payer: Self-pay

## 2019-05-17 NOTE — Telephone Encounter (Signed)
Left message for patient to remind of missed remote transmission.  

## 2019-06-01 ENCOUNTER — Telehealth: Payer: Self-pay

## 2019-06-01 ENCOUNTER — Ambulatory Visit (INDEPENDENT_AMBULATORY_CARE_PROVIDER_SITE_OTHER): Payer: Self-pay | Admitting: *Deleted

## 2019-06-01 DIAGNOSIS — I420 Dilated cardiomyopathy: Secondary | ICD-10-CM

## 2019-06-01 NOTE — Telephone Encounter (Signed)
Spoke to pt regarding alert for nearing ERI. Informed pt that we would start monthly battery checks. Pt verbalized understanding and has no further questions at this time.

## 2019-06-02 LAB — CUP PACEART REMOTE DEVICE CHECK
Battery Voltage: 2.63 V
Brady Statistic AP VP Percent: 0 %
Brady Statistic AP VS Percent: 0.16 %
Brady Statistic AS VP Percent: 0.03 %
Brady Statistic AS VS Percent: 99.81 %
Brady Statistic RA Percent Paced: 0.16 %
Brady Statistic RV Percent Paced: 0.03 %
Date Time Interrogation Session: 20200624121735
HighPow Impedance: 304 Ohm
HighPow Impedance: 71 Ohm
Implantable Lead Implant Date: 20130906
Implantable Lead Implant Date: 20130906
Implantable Lead Location: 753859
Implantable Lead Location: 753860
Implantable Lead Model: 181
Implantable Lead Model: 5076
Implantable Lead Serial Number: 322962
Implantable Pulse Generator Implant Date: 20130906
Lead Channel Impedance Value: 304 Ohm
Lead Channel Impedance Value: 361 Ohm
Lead Channel Pacing Threshold Amplitude: 0.5 V
Lead Channel Pacing Threshold Amplitude: 1 V
Lead Channel Pacing Threshold Pulse Width: 0.4 ms
Lead Channel Pacing Threshold Pulse Width: 0.4 ms
Lead Channel Sensing Intrinsic Amplitude: 10.375 mV
Lead Channel Sensing Intrinsic Amplitude: 10.375 mV
Lead Channel Sensing Intrinsic Amplitude: 2 mV
Lead Channel Sensing Intrinsic Amplitude: 2 mV
Lead Channel Setting Pacing Amplitude: 2 V
Lead Channel Setting Pacing Amplitude: 2.5 V
Lead Channel Setting Pacing Pulse Width: 0.4 ms
Lead Channel Setting Sensing Sensitivity: 0.3 mV

## 2019-06-13 ENCOUNTER — Encounter: Payer: Self-pay | Admitting: Cardiology

## 2019-06-13 NOTE — Progress Notes (Signed)
Remote ICD transmission.   

## 2019-07-01 ENCOUNTER — Encounter: Payer: Self-pay | Admitting: *Deleted

## 2019-07-04 ENCOUNTER — Telehealth: Payer: Self-pay

## 2019-07-04 NOTE — Telephone Encounter (Signed)
Left message for patient to remind of missed remote transmission.  

## 2019-07-12 ENCOUNTER — Other Ambulatory Visit (HOSPITAL_COMMUNITY): Payer: Self-pay | Admitting: Internal Medicine

## 2019-07-12 MED FILL — SPIRONOLACTONE 25 MG TABLET: 25 | 30 days supply | Qty: 30 | Fill #0

## 2019-07-19 ENCOUNTER — Encounter: Payer: Self-pay | Admitting: Internal Medicine

## 2019-08-01 ENCOUNTER — Ambulatory Visit (INDEPENDENT_AMBULATORY_CARE_PROVIDER_SITE_OTHER): Payer: Self-pay | Admitting: *Deleted

## 2019-08-01 DIAGNOSIS — I472 Ventricular tachycardia, unspecified: Secondary | ICD-10-CM

## 2019-08-01 DIAGNOSIS — I420 Dilated cardiomyopathy: Secondary | ICD-10-CM

## 2019-08-08 LAB — CUP PACEART REMOTE DEVICE CHECK
Battery Voltage: 2.62 V
Brady Statistic AP VP Percent: 0 %
Brady Statistic AP VS Percent: 0.07 %
Brady Statistic AS VP Percent: 0.03 %
Brady Statistic AS VS Percent: 99.89 %
Brady Statistic RA Percent Paced: 0.08 %
Brady Statistic RV Percent Paced: 0.03 %
Date Time Interrogation Session: 20200831122840
HighPow Impedance: 285 Ohm
HighPow Impedance: 64 Ohm
Implantable Lead Implant Date: 20130906
Implantable Lead Implant Date: 20130906
Implantable Lead Location: 753859
Implantable Lead Location: 753860
Implantable Lead Model: 181
Implantable Lead Model: 5076
Implantable Lead Serial Number: 322962
Implantable Pulse Generator Implant Date: 20130906
Lead Channel Impedance Value: 304 Ohm
Lead Channel Impedance Value: 399 Ohm
Lead Channel Pacing Threshold Amplitude: 0.5 V
Lead Channel Pacing Threshold Amplitude: 1.125 V
Lead Channel Pacing Threshold Pulse Width: 0.4 ms
Lead Channel Pacing Threshold Pulse Width: 0.4 ms
Lead Channel Sensing Intrinsic Amplitude: 1.875 mV
Lead Channel Sensing Intrinsic Amplitude: 1.875 mV
Lead Channel Sensing Intrinsic Amplitude: 9.25 mV
Lead Channel Sensing Intrinsic Amplitude: 9.25 mV
Lead Channel Setting Pacing Amplitude: 2 V
Lead Channel Setting Pacing Amplitude: 2.5 V
Lead Channel Setting Pacing Pulse Width: 0.4 ms
Lead Channel Setting Sensing Sensitivity: 0.3 mV

## 2019-08-09 NOTE — Addendum Note (Signed)
Addended by: Douglass Rivers D on: 08/09/2019 09:50 AM   Modules accepted: Level of Service

## 2019-08-09 NOTE — Progress Notes (Signed)
Remote ICD transmission.   

## 2019-08-31 ENCOUNTER — Encounter: Payer: Self-pay | Admitting: Internal Medicine

## 2019-08-31 ENCOUNTER — Ambulatory Visit: Payer: Self-pay | Admitting: Internal Medicine

## 2019-08-31 ENCOUNTER — Encounter (INDEPENDENT_AMBULATORY_CARE_PROVIDER_SITE_OTHER): Payer: Self-pay

## 2019-08-31 ENCOUNTER — Other Ambulatory Visit: Payer: Self-pay

## 2019-08-31 VITALS — BP 112/72 | HR 66 | Ht 67.0 in | Wt 220.4 lb

## 2019-08-31 DIAGNOSIS — I420 Dilated cardiomyopathy: Secondary | ICD-10-CM

## 2019-08-31 DIAGNOSIS — I472 Ventricular tachycardia, unspecified: Secondary | ICD-10-CM

## 2019-08-31 DIAGNOSIS — Z95810 Presence of automatic (implantable) cardiac defibrillator: Secondary | ICD-10-CM

## 2019-08-31 DIAGNOSIS — I5022 Chronic systolic (congestive) heart failure: Secondary | ICD-10-CM

## 2019-08-31 DIAGNOSIS — I471 Supraventricular tachycardia: Secondary | ICD-10-CM

## 2019-08-31 DIAGNOSIS — Z79899 Other long term (current) drug therapy: Secondary | ICD-10-CM

## 2019-08-31 LAB — CUP PACEART INCLINIC DEVICE CHECK
Battery Voltage: 2.62 V
Brady Statistic AP VP Percent: 0 %
Brady Statistic AP VS Percent: 0.15 %
Brady Statistic AS VP Percent: 0.03 %
Brady Statistic AS VS Percent: 99.82 %
Brady Statistic RA Percent Paced: 0.15 %
Brady Statistic RV Percent Paced: 0.03 %
Date Time Interrogation Session: 20200923193215
HighPow Impedance: 342 Ohm
HighPow Impedance: 72 Ohm
Implantable Lead Implant Date: 20130906
Implantable Lead Implant Date: 20130906
Implantable Lead Location: 753859
Implantable Lead Location: 753860
Implantable Lead Model: 181
Implantable Lead Model: 5076
Implantable Lead Serial Number: 322962
Implantable Pulse Generator Implant Date: 20130906
Lead Channel Impedance Value: 304 Ohm
Lead Channel Impedance Value: 399 Ohm
Lead Channel Pacing Threshold Amplitude: 0.5 V
Lead Channel Pacing Threshold Amplitude: 1 V
Lead Channel Pacing Threshold Pulse Width: 0.4 ms
Lead Channel Pacing Threshold Pulse Width: 0.4 ms
Lead Channel Sensing Intrinsic Amplitude: 1.625 mV
Lead Channel Sensing Intrinsic Amplitude: 12 mV
Lead Channel Setting Pacing Amplitude: 2 V
Lead Channel Setting Pacing Amplitude: 2.5 V
Lead Channel Setting Pacing Pulse Width: 0.4 ms
Lead Channel Setting Sensing Sensitivity: 0.3 mV

## 2019-08-31 MED ORDER — SPIRONOLACTONE 25 MG PO TABS: 25.0000 mg | ORAL_TABLET | Freq: Every day | ORAL | 11 refills | Status: DC

## 2019-08-31 MED ORDER — POTASSIUM CHLORIDE CRYS ER 20 MEQ PO TBCR: EXTENDED_RELEASE_TABLET | ORAL | 11 refills | Status: DC

## 2019-08-31 MED ORDER — AMIODARONE HCL 200 MG PO TABS: 200.0000 mg | ORAL_TABLET | Freq: Every day | ORAL | 3 refills | Status: DC

## 2019-08-31 MED FILL — POTASSIUM CHLORIDE CRYS ER: 20 | 30 days supply | Qty: 60 | Fill #0

## 2019-08-31 MED FILL — SPIRONOLACTONE 25 MG TABLET: 25 | 30 days supply | Qty: 30 | Fill #0

## 2019-08-31 NOTE — Patient Instructions (Addendum)
Medication Instructions:  Your physician has recommended you make the following change in your medication:  1. Decrease Amiodarone to 200 mg ONCE daily  * If you need a refill on your cardiac medications before your next appointment, please call your pharmacy.   Labwork: Today: CMET, TSH & Iron TIBC If you have labs (blood work) drawn today and your tests are completely normal, you will receive your results only by:  [place] (if you have MyChart) OR  A paper copy in the mail If you have any lab test that is abnormal or we need to change your treatment, we will call you to review the results.  Testing/Procedures: None ordered  Follow-Up: Remote monitoring is used to monitor your Pacemaker of ICD from home. This monitoring reduces the number of office visits required to check your device to one time per year. It allows [place] to keep an eye on the functioning of your device to ensure it is working properly. You are scheduled for a device check from home on 09/01/19. You may send your transmission at any time that day. If you have a wireless device, the transmission will be sent automatically. After your physician reviews your transmission, you will receive a postcard with your next transmission date.  Your physician recommends that you schedule a follow-up appointment in: 3 months with Dr. Haroldine Laws.  Your physician wants you to follow-up in: 1 year with Dr. Caryl Comes.  You will receive a reminder letter in the mail two months in advance. If you don't receive a letter, please call our office to schedule the follow-up appointment.  Thank you for choosing [facility]!!   [phone]  Any Other Special Instructions Will Be Listed Below (If Applicable).

## 2019-08-31 NOTE — Progress Notes (Signed)
Patient Care Team: Patient, No Pcp Per as PCP - General (General Practice)   HPI  Sydney Shaffer is a 53 y.o. female Seen in followup for ICD implanted Sept 2013 for primary prevention in the setting of nonischemic cardiomyopathy. QRS was narrow.    She has a history of SVT ablation in 2014.  At this study, she was found to have a tachycardia focus in the region of the AV node which was successfully ablated.  She also had typical AV nodal reentry and successful slow pathway elimination.  When seen 2017 atrial arrhythmia was noted to have recurred.  She has been maintained on amiodarone.  She is also had recurrent tachycardia Rx w ATP 8/19.    The patient denies chest pain, shortness of breath, nocturnal dyspnea, orthopnea or peripheral edema.  There have been no palpitations, lightheadedness or syncope.     DATE TEST EF   7/13 Echo  20-25 %   2/15 Echo 20-25 %   10/17 Echo  30-35   10/17 Cath  Normal CA  6/19 Echo  30-35%       Date Cr K Hgb TSH LFTs PFTs  9/17      2.555 22    1/18 0.83 4.5         9/19 0.78 4.6 12.8(4/19) 2.9 15        Past Medical History:  Diagnosis Date  . Anemia    felt to be due to heavy menstrual flow  . Atrial tachycardia (HCC)    s/p ablation  . AVNRT (AV nodal re-entry tachycardia) ([place])    s/p ablation  . CHF (congestive heart failure) ([place])    Dx 03/2012 - dilated cardiomyopathy with EF 20-25% by echo (abnl nuc but normal coronaries 04/02/12 per cath.   . Chronic systolic heart failure ([place])   . Dysrhythmia    Bradycardia  . Hypertension   . Hypertensive heart disease with CHF ([place])   . ICD (implantable cardiac defibrillator) in place 08/13/2012    Past Surgical History:  Procedure Laterality Date  . APPENDECTOMY    . CARDIAC CATHETERIZATION  April 2013   normal coronaries  . CARDIAC CATHETERIZATION [name] 09/12/2016   Procedure: Right/Left Heart Cath and Coronary Angiography;  Surgeon: Jolaine Artist, MD;  Location: [place] [facility];  Service: Cardiovascular;  Laterality: [name];  . DILITATION & CURRETTAGE/HYSTROSCOPY WITH VERSAPOINT RESECTION [name] 10/05/2015   Procedure: DILATATION & CURETTAGE/HYSTEROSCOPY WITH VERSAPOINT RESECTION;  Surgeon: [name], MD;  Location: [facility];  Service: Gynecology;  Laterality: [name];  . EP study and ablation  01/07/13   Ablation of AVNRT and atrial tachycardia (arising from the anteroseptal RA 30mm above the HIS)  . ICD  08/13/2012  . IMPLANTABLE CARDIOVERTER DEFIBRILLATOR IMPLANT [name] 08/13/2012   Procedure: IMPLANTABLE CARDIOVERTER DEFIBRILLATOR IMPLANT;  Surgeon: Deboraha Sprang, MD;  Location: [facility] CATH LAB;  Service: Cardiovascular;  Laterality: [name];  . LEFT HEART CATHETERIZATION WITH CORONARY ANGIOGRAM [name] 04/02/2012   Procedure: LEFT HEART CATHETERIZATION WITH CORONARY ANGIOGRAM;  Surgeon: Peter M Martinique, MD;  Location: [facility] CATH LAB;  Service: Cardiovascular;  Laterality: [name];  . SUPRAVENTRICULAR TACHYCARDIA ABLATION [name] 01/07/2013   Procedure: SUPRAVENTRICULAR TACHYCARDIA ABLATION;  Surgeon: Thompson Grayer, MD;  Location: [facility] CATH LAB;  Service: Cardiovascular;  Laterality: [name];  . TUBAL LIGATION      Current Outpatient Medications  Medication Sig Dispense Refill  . amiodarone (PACERONE) 200 MG tablet TAKE 1 AND 1/2 TABLETS(300 MG) BY MOUTH  DAILY 270 tablet 0  . carvedilol (COREG) 12.5 MG tablet TAKE 1 TABLET(12.5 MG) BY MOUTH TWICE DAILY 180 tablet 1  . Doxylamine Succinate, Sleep, (SLEEP AID PO) Take 1 tablet by mouth at bedtime as needed (sleep).     . ferrous sulfate 325 (65 FE) MG tablet Take 325 mg by mouth daily with breakfast.    . furosemide (LASIX) 40 MG tablet Take 1.5 tablets (60 mg total) by mouth 2 (two) times daily. 90 tablet 5  . KLOR-CON M20 20 MEQ tablet TAKE 1 TABLET (20 MEQ TOTAL) BY MOUTH 2 (TWO) TIMES DAILY. 60 tablet 5  . Multiple Vitamin (MULTIVITAMIN WITH MINERALS) TABS Take 1 tablet by mouth daily.    . sacubitril-valsartan (ENTRESTO) 97-103 MG Take  1 tablet by mouth 2 (two) times daily. 60 tablet 0  . spironolactone (ALDACTONE) 25 MG tablet TAKE 1 TABLET BY MOUTH DAILY. 30 tablet 0   No current facility-administered medications for this visit.     No Known Allergies  Review of Systems negative except from HPI and PMH  Physical Exam BP 112/72   Pulse 66   Ht 5\' 7"  (1.702 m)   Wt 220 lb 6.4 oz (100 kg)   SpO2 92%   BMI 34.52 kg/m  Well developed and well nourished in no acute distress HENT normal Neck supple with JVP-flat Clear Device pocket well healed; without hematoma or erythema.  There is no tethering  Regular rate and rhythm, no  gallop No murmur Abd-soft with active BS No Clubbing cyanosis  edema Skin-warm and dry A & Oriented  Grossly normal sensory and motor function  ECG sinus 66 18/10/44   Assessment and  Plan  Nonischemic cardiomyopathy  Atrial Tachycardia/Flutter (CL310)  ICD Medtronic The patient's device was interrogated.  The information was reviewed. No changes were made in the programming.      CHF chronic systolic  Suprventricular tachycardia-CL 330   Recurrent nonsustained supraventricular tachycardia lasting up to 20 seconds both of these episodes there is a transmission in morphology without a significant change in rate, or change in rate (is mild prolongation.  There is a stable VA relationship suggesting that this is a supraventricular tachycardia with changes in ventricular activation sequence i.e. aberration and with different types of aberration.  We will decrease amiodarone from 300--200 mg a day.  Her device is approaching ERI.  We have discussed change of procedure including risks not limited to lead malfunction and infection.  She understands and anticipates proceeding later this fall.  If demonstrated alert tones.  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  She is no longer menstruating.  We will check her CBC and her iron studies.    No intercurrent Ventricular tachycardia  We spent more than 50% of our >25 min visit in face to face counseling regarding the above .svt

## 2019-09-01 ENCOUNTER — Ambulatory Visit (INDEPENDENT_AMBULATORY_CARE_PROVIDER_SITE_OTHER): Payer: Self-pay | Admitting: *Deleted

## 2019-09-01 DIAGNOSIS — I420 Dilated cardiomyopathy: Secondary | ICD-10-CM

## 2019-09-01 LAB — COMPREHENSIVE METABOLIC PANEL
ALT: 21 IU/L (ref 0–32)
AST: 23 IU/L (ref 0–40)
Albumin/Globulin Ratio: 1.2 (ref 1.2–2.2)
Albumin: 4.2 g/dL (ref 3.8–4.9)
Alkaline Phosphatase: 94 IU/L (ref 39–117)
BUN/Creatinine Ratio: 17 (ref 9–23)
BUN: 22 mg/dL (ref 6–24)
Bilirubin Total: 0.3 mg/dL (ref 0.0–1.2)
CO2: 26 mmol/L (ref 20–29)
Calcium: 10 mg/dL (ref 8.7–10.2)
Chloride: 105 mmol/L (ref 96–106)
Creatinine, Ser: 1.31 mg/dL — ABNORMAL HIGH (ref 0.57–1.00)
GFR calc Af Amer: 53 mL/min/{1.73_m2} — ABNORMAL LOW (ref >59)
GFR calc non Af Amer: 46 mL/min/{1.73_m2} — ABNORMAL LOW (ref >59)
Globulin, Total: 3.4 g/dL (ref 1.5–4.5)
Glucose: 90 mg/dL (ref 65–99)
Potassium: 4.1 mmol/L (ref 3.5–5.2)
Sodium: 141 mmol/L (ref 134–144)
Total Protein: 7.6 g/dL (ref 6.0–8.5)

## 2019-09-01 LAB — IRON AND TIBC
Iron Saturation: 29 % (ref 15–55)
Iron: 89 ug/dL (ref 27–159)
Total Iron Binding Capacity: 306 ug/dL (ref 250–450)
UIBC: 217 ug/dL (ref 131–425)

## 2019-09-01 LAB — TSH: TSH: 1.46 u[IU]/mL (ref 0.450–4.500)

## 2019-09-02 ENCOUNTER — Telehealth: Payer: Self-pay

## 2019-09-02 NOTE — Telephone Encounter (Signed)
Left message for patient regarding disconnected monitor.  

## 2019-09-06 ENCOUNTER — Telehealth: Payer: Self-pay

## 2019-09-06 DIAGNOSIS — N289 Disorder of kidney and ureter, unspecified: Secondary | ICD-10-CM

## 2019-09-06 LAB — CUP PACEART REMOTE DEVICE CHECK
Battery Voltage: 2.62 V
Brady Statistic AP VP Percent: 0 %
Brady Statistic AP VS Percent: 0.07 %
Brady Statistic AS VP Percent: 0.02 %
Brady Statistic AS VS Percent: 99.9 %
Brady Statistic RA Percent Paced: 0.07 %
Brady Statistic RV Percent Paced: 0.03 %
Date Time Interrogation Session: 20200928170737
HighPow Impedance: 342 Ohm
HighPow Impedance: 71 Ohm
Implantable Lead Implant Date: 20130906
Implantable Lead Implant Date: 20130906
Implantable Lead Location: 753859
Implantable Lead Location: 753860
Implantable Lead Model: 181
Implantable Lead Model: 5076
Implantable Lead Serial Number: 322962
Implantable Pulse Generator Implant Date: 20130906
Lead Channel Impedance Value: 342 Ohm
Lead Channel Impedance Value: 399 Ohm
Lead Channel Pacing Threshold Amplitude: 0.5 V
Lead Channel Pacing Threshold Amplitude: 1 V
Lead Channel Pacing Threshold Pulse Width: 0.4 ms
Lead Channel Pacing Threshold Pulse Width: 0.4 ms
Lead Channel Sensing Intrinsic Amplitude: 11.75 mV
Lead Channel Sensing Intrinsic Amplitude: 11.75 mV
Lead Channel Sensing Intrinsic Amplitude: 2.125 mV
Lead Channel Sensing Intrinsic Amplitude: 2.125 mV
Lead Channel Setting Pacing Amplitude: 2 V
Lead Channel Setting Pacing Amplitude: 2.5 V
Lead Channel Setting Pacing Pulse Width: 0.4 ms
Lead Channel Setting Sensing Sensitivity: 0.3 mV

## 2019-09-06 NOTE — Telephone Encounter (Signed)
Left detailed VM with pt per DPR with lab results and recommendations; Pt was instructed to call the office to schedule repeat lab work in 6 weeks. Any questions could be deferred to me or triage.

## 2019-09-06 NOTE — Telephone Encounter (Signed)
-----   Message from Deboraha Sprang, MD sent at 09/05/2019  7:38 AM EDT ----- Please Inform Patient  Labs are normal x mild worsening in renal function perhaps from meds  Lets recheck BMET in 6 weeks   Thanks

## 2019-09-08 ENCOUNTER — Encounter: Payer: Self-pay | Admitting: Cardiology

## 2019-09-08 NOTE — Progress Notes (Signed)
Remote ICD transmission.   

## 2019-09-23 NOTE — Progress Notes (Signed)
Continue monthly battery checks. She is 2.62 V out of 2.63 V for RRT, so will likely meet ERI in the next month or so.

## 2019-09-30 ENCOUNTER — Encounter: Payer: Self-pay | Admitting: *Deleted

## 2019-10-03 ENCOUNTER — Telehealth: Payer: Self-pay | Admitting: Emergency Medicine

## 2019-10-03 NOTE — Telephone Encounter (Signed)
Called office due to audible alert from device. Remote transmission shows device at RRT as of 10/03/19. Patient has had 3 episodes of SVT from October 16-19th that appear to be SVT with longest episode lasting 22 minutes on 09/26/19. Patient reports no CP, SOB, syncope, or dizziness but did feel that her heart rate had increased. Reports she has been inconsistent with times of pacerone doses. Stressed importance of keeping consistent schedule for meds. Will have patient schedued for appointment with Dr Caryl Comes to discuss generator change and to disable audible alert.

## 2019-10-06 ENCOUNTER — Other Ambulatory Visit: Payer: Self-pay | Admitting: *Deleted

## 2019-10-06 ENCOUNTER — Ambulatory Visit (INDEPENDENT_AMBULATORY_CARE_PROVIDER_SITE_OTHER): Payer: Self-pay | Admitting: *Deleted

## 2019-10-06 ENCOUNTER — Other Ambulatory Visit: Payer: Self-pay

## 2019-10-06 DIAGNOSIS — I472 Ventricular tachycardia, unspecified: Secondary | ICD-10-CM

## 2019-10-06 DIAGNOSIS — N289 Disorder of kidney and ureter, unspecified: Secondary | ICD-10-CM

## 2019-10-06 LAB — CUP PACEART INCLINIC DEVICE CHECK
Battery Voltage: 2.61 V
Brady Statistic AP VP Percent: 0 %
Brady Statistic AP VS Percent: 0.04 %
Brady Statistic AS VP Percent: 0.03 %
Brady Statistic AS VS Percent: 99.93 %
Brady Statistic RA Percent Paced: 0.04 %
Brady Statistic RV Percent Paced: 0.03 %
Date Time Interrogation Session: 20201029171439
HighPow Impedance: 304 Ohm
HighPow Impedance: 74 Ohm
Implantable Lead Implant Date: 20130906
Implantable Lead Implant Date: 20130906
Implantable Lead Location: 753859
Implantable Lead Location: 753860
Implantable Lead Model: 181
Implantable Lead Model: 5076
Implantable Lead Serial Number: 322962
Implantable Pulse Generator Implant Date: 20130906
Lead Channel Impedance Value: 342 Ohm
Lead Channel Impedance Value: 399 Ohm
Lead Channel Pacing Threshold Amplitude: 0.5 V
Lead Channel Pacing Threshold Amplitude: 1.25 V
Lead Channel Pacing Threshold Pulse Width: 0.4 ms
Lead Channel Pacing Threshold Pulse Width: 0.4 ms
Lead Channel Sensing Intrinsic Amplitude: 12.875 mV
Lead Channel Sensing Intrinsic Amplitude: 2.375 mV
Lead Channel Setting Pacing Amplitude: 2 V
Lead Channel Setting Pacing Amplitude: 2.5 V
Lead Channel Setting Pacing Pulse Width: 0.4 ms
Lead Channel Setting Sensing Sensitivity: 0.3 mV

## 2019-10-06 NOTE — Patient Instructions (Addendum)
Dr Olin Pia nurse will call with instructions about changing battery in your device. Please contact the office if you do not receive a call before Tuesday 10/11/2019.

## 2019-10-06 NOTE — Progress Notes (Signed)
ICD check in clinic. Normal device function. Thresholds and sensing consistent with previous device measurements. Impedance trends stable over time. 1 Atrial high rate that appears to be AT.No ventricular arrhythmias. Histogram distribution appropriate for patient and level of activity. No changes made this session. Device programmed at appropriate safety margins. Device programmed to optimize intrinsic conduction. Estimated longevity @ ERI , RRT audible alert programmed off to patient. Dr Caryl Comes will be notified and patient will put on schedule for generator change. . Pt enrolled in remote follow-up. Patient education completed including shock plan.

## 2019-10-07 LAB — BASIC METABOLIC PANEL
BUN/Creatinine Ratio: 23 (ref 9–23)
BUN: 23 mg/dL (ref 6–24)
CO2: 23 mmol/L (ref 20–29)
Calcium: 9.5 mg/dL (ref 8.7–10.2)
Chloride: 107 mmol/L — ABNORMAL HIGH (ref 96–106)
Creatinine, Ser: 1.01 mg/dL — ABNORMAL HIGH (ref 0.57–1.00)
GFR calc Af Amer: 73 mL/min/{1.73_m2} (ref >59)
GFR calc non Af Amer: 63 mL/min/{1.73_m2} (ref >59)
Glucose: 98 mg/dL (ref 65–99)
Potassium: 3.9 mmol/L (ref 3.5–5.2)
Sodium: 142 mmol/L (ref 134–144)

## 2019-10-07 NOTE — Telephone Encounter (Signed)
Noted  

## 2019-10-12 ENCOUNTER — Telehealth: Payer: Self-pay | Admitting: Internal Medicine

## 2019-10-12 NOTE — Telephone Encounter (Signed)
New message    Pt is calling to get the results of her last lab    Please call

## 2019-10-12 NOTE — Telephone Encounter (Signed)
Patient was notified that Dr. Caryl Comes was out of town and has not reviewed her lab results, however after a preliminary review was advised that her creatinine had improved. She will be notified after Dr. Caryl Comes has reviewed if there are any changes that need to be made. Patient verbalized understanding.

## 2019-10-14 ENCOUNTER — Telehealth: Payer: Self-pay | Admitting: Emergency Medicine

## 2019-10-14 ENCOUNTER — Telehealth: Payer: Self-pay | Admitting: *Deleted

## 2019-10-14 NOTE — Telephone Encounter (Signed)
LMOM for patient to try to establish appointment for 12 pm today in device clinic.  Calling patient concerning report of audible alert that she reported to [employer].

## 2019-10-14 NOTE — Telephone Encounter (Signed)
Called pt to arrange ICD gen change. Scheduled for 12/7, instructions reviewed w/ pt.  NPO after midnight, hold meds morning of procedure. Covid screening scheduled for 12/4, instructions reviewed w/ pt. Aware office will contact her to arrange post procedure follow up. Patient verbalized understanding and agreeable to plan.

## 2019-10-17 NOTE — Telephone Encounter (Signed)
Patient reports audible alarm continues to go off at the same time each morning from device. Spoke with [employer] and patient given the option to send in remote transmission for evaluation and then come in to disable alarm. Patient declined opportunity to come in for evaluation due to copay and will just wait for generator change which is scheduled with Dr Caryl Comes.

## 2019-10-31 ENCOUNTER — Ambulatory Visit (INDEPENDENT_AMBULATORY_CARE_PROVIDER_SITE_OTHER): Payer: Self-pay | Admitting: *Deleted

## 2019-10-31 DIAGNOSIS — Z95810 Presence of automatic (implantable) cardiac defibrillator: Secondary | ICD-10-CM

## 2019-11-02 LAB — CUP PACEART REMOTE DEVICE CHECK
Battery Voltage: 2.6 V
Brady Statistic AP VP Percent: 0 %
Brady Statistic AP VS Percent: 0.03 %
Brady Statistic AS VP Percent: 0.01 %
Brady Statistic AS VS Percent: 99.96 %
Brady Statistic RA Percent Paced: 0.03 %
Brady Statistic RV Percent Paced: 0.01 %
Date Time Interrogation Session: 20201125005859
HighPow Impedance: 304 Ohm
HighPow Impedance: 71 Ohm
Implantable Lead Implant Date: 20130906
Implantable Lead Implant Date: 20130906
Implantable Lead Location: 753859
Implantable Lead Location: 753860
Implantable Lead Model: 181
Implantable Lead Model: 5076
Implantable Lead Serial Number: 322962
Implantable Pulse Generator Implant Date: 20130906
Lead Channel Impedance Value: 342 Ohm
Lead Channel Impedance Value: 399 Ohm
Lead Channel Pacing Threshold Amplitude: 0.5 V
Lead Channel Pacing Threshold Amplitude: 1.25 V
Lead Channel Pacing Threshold Pulse Width: 0.4 ms
Lead Channel Pacing Threshold Pulse Width: 0.4 ms
Lead Channel Sensing Intrinsic Amplitude: 1.5 mV
Lead Channel Sensing Intrinsic Amplitude: 1.5 mV
Lead Channel Sensing Intrinsic Amplitude: 10.5 mV
Lead Channel Sensing Intrinsic Amplitude: 10.5 mV
Lead Channel Setting Pacing Amplitude: 2 V
Lead Channel Setting Pacing Amplitude: 2.5 V
Lead Channel Setting Pacing Pulse Width: 0.4 ms
Lead Channel Setting Sensing Sensitivity: 0.3 mV

## 2019-11-09 ENCOUNTER — Ambulatory Visit (HOSPITAL_COMMUNITY)
Admission: RE | Admit: 2019-11-09 | Discharge: 2019-11-09 | Disposition: A | Discharge status: Home / Self Care | Payer: Self-pay | Source: Ambulatory Visit | Attending: Internal Medicine | Admitting: Internal Medicine

## 2019-11-09 ENCOUNTER — Encounter (HOSPITAL_COMMUNITY): Payer: Self-pay | Admitting: Internal Medicine

## 2019-11-09 ENCOUNTER — Other Ambulatory Visit: Payer: Self-pay

## 2019-11-09 VITALS — BP 132/77 | HR 74 | Wt 212.0 lb

## 2019-11-09 DIAGNOSIS — Z95810 Presence of automatic (implantable) cardiac defibrillator: Secondary | ICD-10-CM | POA: Insufficient documentation

## 2019-11-09 DIAGNOSIS — I5022 Chronic systolic (congestive) heart failure: Secondary | ICD-10-CM | POA: Insufficient documentation

## 2019-11-09 DIAGNOSIS — I471 Supraventricular tachycardia: Secondary | ICD-10-CM | POA: Insufficient documentation

## 2019-11-09 DIAGNOSIS — Z79899 Other long term (current) drug therapy: Secondary | ICD-10-CM | POA: Insufficient documentation

## 2019-11-09 DIAGNOSIS — I428 Other cardiomyopathies: Secondary | ICD-10-CM | POA: Insufficient documentation

## 2019-11-09 DIAGNOSIS — I110 Hypertensive heart disease with heart failure: Secondary | ICD-10-CM | POA: Insufficient documentation

## 2019-11-09 MED ORDER — FARXIGA 10 MG PO TABS: 10.0000 mg | ORAL_TABLET | Freq: Every day | ORAL | 11 refills | Status: DC

## 2019-11-09 NOTE — Progress Notes (Signed)
While pt was in the office I gave her a 30 day free card for [place] and had her complete the [employer] pt assist form.  Dr Haroldine Laws sign his portion and application was faxed into [employer]

## 2019-11-09 NOTE — Patient Instructions (Addendum)
START Farxiga 10 mg, one tab daily  Your physician recommends that you schedule a follow-up appointment in: 6 months with Dr Haroldine Laws and echo -if our office has not reached out to you by May 2021, please give our office a call to schedule  Your physician has requested that you have an echocardiogram. Echocardiography is a painless test that uses sound waves to create images of your heart. It provides your doctor with information about the size and shape of your heart and how well your heart's chambers and valves are working. This procedure takes approximately one hour. There are no restrictions for this procedure.   Do the following things EVERYDAY: 1) Weigh yourself in the morning before breakfast. Write it down and keep it in a log. 2) Take your medicines as prescribed 3) Eat low salt foods-Limit salt (sodium) to 2000 mg per day.  4) Stay as active as you can everyday 5) Limit all fluids for the day to less than 2 liters  At the [facility], you and your health needs are our priority. As part of our continuing mission to provide you with exceptional heart care, we have created designated Provider Care Teams. These Care Teams include your primary Cardiologist (physician) and Advanced Practice Providers (APPs- Physician Assistants and Nurse Practitioners) who all work together to provide you with the care you need, when you need it.   You may see any of the following providers on your designated Care Team at your next follow up: Marland Kitchen Dr Glori Bickers . Dr [name] . Darrick Grinder, NP . Lyda Jester, PA . Audry Riles, PharmD   Please be sure to bring in all your medications bottles to every appointment.

## 2019-11-09 NOTE — Progress Notes (Signed)
Advanced Heart Failure Clinic Note   Patient ID: Sydney Shaffer, female   DOB: 1966/07/09, 53 y.o.   MRN: [id number] EP: Dr Caryl Comes Medtronic ICD PCP: None   HPI: Sydney Shaffer is a 53 y.o. female with a history of chronic systolic HF due to nonischemic cardiomyopathy s/p Medtronic ICD, HTN, and SVT s/p 01/08/14 ablation.  Cath 4/13 normal coronaries    She presents for HF f/u. We have not seen her since 5/19.  Last echo 6/19. EF 30-35% mild MR. Saw Dr. Caryl Comes in 9/20 and had some recurrent brief. SVT. No VT. Device near ERI - scheduled for generator change on 12/7. About once a month gets SOB and takes an extra dose of lasix and back to baseline. Feels pretty good. Can go to store and do all ADLs without any problem. SOB with steps a bit. No CP, edema, orthopnea or PND. No ICD firings.   ICD interrogation today: No VT/AF. Optivol ok. Activity ~ 6 hours. Personally reviewed   ECHO 07/05/12 EF 20-25%  ECHO 02/04/14 EF 20-25%  ECHO 6/16 EF 30-35% mild MR ECHO 09/2016 EF 25-30%, Grade 1 DD  RHC and LHC 09/12/16  Normal cors.   RA = 3 RV = 23/4 PA = 22/6 (14) PCW = 8 Ao = 123/69 (91) LV = 106/6 Fick cardiac output/index = 7.4/3.5 PVR = < 1.0 WU SVR = 958  FA sat = 88% PA sat = 63%, 62%  CPX 5/18  FVC 1.99 (61%)    FEV1 1.59 (61%)     FEV1/FVC 80 (99%)     MVV 58 (57%)     Resting HR: 81 Peak HR: 121  (72% age predicted max HR) BP rest: 108/68 BP peak: 144/80 Peak VO2: 12.3 (61% predicted peak VO2) VE/VCO2 slope: 25 OUES: 2.13 Peak RER: 0.98 Ventilatory Threshold: 11.2 (55% predicted or measured peak VO2) VE/MVV: 59% PETCO2 at peak: 40 O2pulse: 10  (91% predicted O2pulse)  CPX 06/03/14: FVC 1.69 (51%)  FEV1 1.24 (47%)  FEV1/FVC 74%  Peak VO2: 12.6 (57.8% predicted peak VO2) VE/VCO2 slope: 28.6 OUES: 1.60 Peak RER: 1.01 Peak VO2 adjusted to the patient's ideal body weight of 150 lb (67.8 kg) the peak VO2 is 16.5 ml/kg (ibw)/min (62% of the ibw-adjusted  predicted).  ECG: NSR, iRBBB, nonspecific T wave flattening  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 She does not drink alcohol . Lives alone. She has 3 grown children   FH: Mom breast cancer . Father cancer   ROS: All systems negative except as listed in HPI, PMH and Problem List.  Past Medical History:  Diagnosis Date  . Anemia    felt to be due to heavy menstrual flow  . Atrial tachycardia (HCC)    s/p ablation  . AVNRT (AV nodal re-entry tachycardia) ([place])    s/p ablation  . CHF (congestive heart failure) ([place])    Dx 03/2012 - dilated cardiomyopathy with EF 20-25% by echo (abnl nuc but normal coronaries 04/02/12 per cath.   . Chronic systolic heart failure ([place])   . Dysrhythmia    Bradycardia  . Hypertension   . Hypertensive heart disease with CHF ([place])   . ICD (implantable cardiac defibrillator) in place 08/13/2012    Current Outpatient Medications  Medication Sig Dispense Refill  . amiodarone (PACERONE) 200 MG tablet Take 1 tablet (200 mg total) by mouth daily. 90 tablet 3  . carvedilol (COREG) 12.5 MG tablet Take 12.5 mg by mouth  2 (two) times daily with a meal.    . Doxylamine Succinate, Sleep, (SLEEP AID PO) Take 1 tablet by mouth at bedtime as needed (sleep).     . ferrous sulfate 325 (65 FE) MG tablet Take 325 mg by mouth daily with breakfast.    . furosemide (LASIX) 40 MG tablet Take 1.5 tablets (60 mg total) by mouth 2 (two) times daily. 90 tablet 5  . Multiple Vitamin (MULTIVITAMIN WITH MINERALS) TABS Take 1 tablet by mouth daily.    . potassium chloride SA (KLOR-CON M20) 20 MEQ tablet TAKE 1 TABLET (20 MEQ TOTAL) BY MOUTH 2 (TWO) TIMES DAILY. 60 tablet 11  . sacubitril-valsartan (ENTRESTO) 97-103 MG Take 1 tablet by mouth 2 (two) times daily. 60 tablet 0  . spironolactone (ALDACTONE) 25 MG tablet Take 1 tablet (25 mg total) by mouth daily. 30 tablet 11   No current facility-administered medications for this encounter.     Vitals:    11/09/19 0943  BP: 132/77  Pulse: 74  SpO2: 97%  Weight: 96.2 kg (212 lb)   Wt Readings from Last 3 Encounters:  11/09/19 96.2 kg (212 lb)  08/31/19 100 kg (220 lb 6.4 oz)  08/30/18 94.3 kg (207 lb 12.8 oz)     PHYSICAL EXAM: General:  Well appearing. No resp difficulty HEENT: normal Neck: supple. no JVD. Carotids 2+ bilat; no bruits. No lymphadenopathy or thryomegaly appreciated. Cor: PMI nondisplaced. Regular rate & rhythm. No rubs, gallops or murmurs. Lungs: clear Abdomen: soft, nontender, nondistended. No hepatosplenomegaly. No bruits or masses. Good bowel sounds. Extremities: obese no cyanosis, clubbing, rash, edema Neuro: alert & orientedx3, cranial nerves grossly intact. moves all 4 extremities w/o difficulty. Affect pleasant   ASSESSMENT & PLAN:  1. Chronic Systolic Heart Failure: Normal coronaries by cath 2013. Nonischemic cardiomyopathy s/p Medtronic ICD. EF 30-35% (05/2015 echo). Echo 10/17 25-30%  Echo 6/19 EF 30-35% - Overall stable NYHA II - Volume status looks good - ICD interrogation today  No VT/AF. Optivol ok  Activity 4-6 hours/day - Continue entresto 97-103 mg twice a day.   - Continue spironolactone to 25 mg daily. BMET 1 week - Continue  Coreg 12.5  mg bid.      - Add [name] if we can find a way to get it for her.          - Repeat echo          - ICD generator change 11/14/19 - Recent blood work ok.                                                                                             -  2. SVT an VT: s/p ablation for atrial tachycardia in 01/2014.  Followed by Dr. Caryl Comes. - Remains on amio at 200 daily    Glori Bickers, MD 11/09/2019

## 2019-11-11 ENCOUNTER — Other Ambulatory Visit (HOSPITAL_COMMUNITY)
Admission: RE | Admit: 2019-11-11 | Discharge: 2019-11-11 | Disposition: A | Discharge status: Home / Self Care | Payer: Self-pay | Source: Ambulatory Visit | Attending: Internal Medicine | Admitting: Internal Medicine

## 2019-11-11 DIAGNOSIS — Z20828 Contact with and (suspected) exposure to other viral communicable diseases: Secondary | ICD-10-CM | POA: Insufficient documentation

## 2019-11-11 DIAGNOSIS — Z01812 Encounter for preprocedural laboratory examination: Secondary | ICD-10-CM | POA: Insufficient documentation

## 2019-11-11 LAB — SARS CORONAVIRUS 2 (TAT 6-24 HRS): SARS Coronavirus 2: NEGATIVE

## 2019-11-14 ENCOUNTER — Ambulatory Visit (HOSPITAL_COMMUNITY)
Admission: RE | Disposition: A | Discharge status: Home / Self Care | Payer: Self-pay | Source: Home / Self Care | Attending: Internal Medicine

## 2019-11-14 ENCOUNTER — Ambulatory Visit (HOSPITAL_COMMUNITY)
Admission: RE | Admit: 2019-11-14 | Discharge: 2019-11-14 | Disposition: A | Discharge status: Home / Self Care | Payer: Self-pay | Attending: Internal Medicine | Admitting: Internal Medicine

## 2019-11-14 ENCOUNTER — Encounter (HOSPITAL_COMMUNITY): Payer: Self-pay | Admitting: Internal Medicine

## 2019-11-14 DIAGNOSIS — I428 Other cardiomyopathies: Secondary | ICD-10-CM

## 2019-11-14 DIAGNOSIS — I5022 Chronic systolic (congestive) heart failure: Secondary | ICD-10-CM | POA: Insufficient documentation

## 2019-11-14 DIAGNOSIS — D649 Anemia, unspecified: Secondary | ICD-10-CM | POA: Insufficient documentation

## 2019-11-14 DIAGNOSIS — I420 Dilated cardiomyopathy: Secondary | ICD-10-CM | POA: Insufficient documentation

## 2019-11-14 DIAGNOSIS — I110 Hypertensive heart disease with heart failure: Secondary | ICD-10-CM | POA: Insufficient documentation

## 2019-11-14 DIAGNOSIS — Z7984 Long term (current) use of oral hypoglycemic drugs: Secondary | ICD-10-CM | POA: Insufficient documentation

## 2019-11-14 DIAGNOSIS — L910 Hypertrophic scar: Secondary | ICD-10-CM | POA: Insufficient documentation

## 2019-11-14 DIAGNOSIS — Z79899 Other long term (current) drug therapy: Secondary | ICD-10-CM | POA: Insufficient documentation

## 2019-11-14 DIAGNOSIS — Z4502 Encounter for adjustment and management of automatic implantable cardiac defibrillator: Secondary | ICD-10-CM | POA: Insufficient documentation

## 2019-11-14 DIAGNOSIS — I471 Supraventricular tachycardia: Secondary | ICD-10-CM | POA: Insufficient documentation

## 2019-11-14 HISTORY — PX: ICD GENERATOR CHANGEOUT: EP1231

## 2019-11-14 LAB — BASIC METABOLIC PANEL
Anion gap: 9 (ref 5–15)
BUN: 13 mg/dL (ref 6–20)
CO2: 22 mmol/L (ref 22–32)
Calcium: 9.4 mg/dL (ref 8.9–10.3)
Chloride: 109 mmol/L (ref 98–111)
Creatinine, Ser: 0.97 mg/dL (ref 0.44–1.00)
GFR calc Af Amer: >60 mL/min (ref >60)
GFR calc non Af Amer: >60 mL/min (ref >60)
Glucose, Bld: 88 mg/dL (ref 70–99)
Potassium: 3.8 mmol/L (ref 3.5–5.1)
Sodium: 140 mmol/L (ref 135–145)

## 2019-11-14 LAB — CBC
HCT: 42.2 % (ref 36.0–46.0)
Hemoglobin: 13.6 g/dL (ref 12.0–15.0)
MCH: 27.4 pg (ref 26.0–34.0)
MCHC: 32.2 g/dL (ref 30.0–36.0)
MCV: 84.9 fL (ref 80.0–100.0)
Platelets: 329 10*3/uL (ref 150–400)
RBC: 4.97 MIL/uL (ref 3.87–5.11)
RDW: 14.5 % (ref 11.5–15.5)
WBC: 6.4 10*3/uL (ref 4.0–10.5)
nRBC: 0 % (ref 0.0–0.2)

## 2019-11-14 LAB — SURGICAL PCR SCREEN
MRSA, PCR: NEGATIVE
Staphylococcus aureus: NEGATIVE

## 2019-11-14 SURGERY — ICD GENERATOR CHANGEOUT

## 2019-11-14 MED ORDER — LIDOCAINE HCL 1 % IJ SOLN
INTRAMUSCULAR | Status: AC
  Filled 2019-11-14: qty 60

## 2019-11-14 MED ORDER — CHLORHEXIDINE GLUCONATE 4 % EX LIQD: 60.0000 mL | Freq: Once | CUTANEOUS | Status: DC

## 2019-11-14 MED ORDER — LIDOCAINE HCL (PF) 1 % IJ SOLN
INTRAMUSCULAR | Status: DC | PRN
  Administered 2019-11-14: 45 mL

## 2019-11-14 MED ORDER — MIDAZOLAM HCL 5 MG/5ML IJ SOLN
INTRAMUSCULAR | Status: DC | PRN
  Administered 2019-11-14: 1 mg via INTRAVENOUS
  Administered 2019-11-14: 2 mg via INTRAVENOUS
  Administered 2019-11-14 (×2): 1 mg via INTRAVENOUS

## 2019-11-14 MED ORDER — FENTANYL CITRATE (PF) 100 MCG/2ML IJ SOLN
INTRAMUSCULAR | Status: AC
  Filled 2019-11-14: qty 2

## 2019-11-14 MED ORDER — MUPIROCIN 2 % EX OINT
1.0000 "application " | TOPICAL_OINTMENT | Freq: Once | CUTANEOUS | Status: AC
  Administered 2019-11-14: 06:00:00 1 via TOPICAL

## 2019-11-14 MED ORDER — MUPIROCIN 2 % EX OINT
TOPICAL_OINTMENT | CUTANEOUS | Status: AC
  Administered 2019-11-14: 1 via TOPICAL
  Filled 2019-11-14: qty 22

## 2019-11-14 MED ORDER — SODIUM CHLORIDE 0.9 % IV SOLN
INTRAVENOUS | Status: DC
  Administered 2019-11-14: 06:00:00 via INTRAVENOUS

## 2019-11-14 MED ORDER — CEFAZOLIN SODIUM-DEXTROSE 2-4 GM/100ML-% IV SOLN
INTRAVENOUS | Status: AC
  Filled 2019-11-14: qty 100

## 2019-11-14 MED ORDER — ONDANSETRON HCL 4 MG/2ML IJ SOLN: 4.0000 mg | Freq: Four times a day (QID) | INTRAMUSCULAR | Status: DC | PRN

## 2019-11-14 MED ORDER — MIDAZOLAM HCL 5 MG/5ML IJ SOLN
INTRAMUSCULAR | Status: AC
  Filled 2019-11-14: qty 5

## 2019-11-14 MED ORDER — ACETAMINOPHEN 325 MG PO TABS: 325.0000 mg | ORAL_TABLET | ORAL | Status: DC | PRN

## 2019-11-14 MED ORDER — CEFAZOLIN SODIUM-DEXTROSE 2-4 GM/100ML-% IV SOLN
2.0000 g | INTRAVENOUS | Status: AC
  Administered 2019-11-14: 2 g via INTRAVENOUS

## 2019-11-14 MED ORDER — FENTANYL CITRATE (PF) 100 MCG/2ML IJ SOLN
INTRAMUSCULAR | Status: DC | PRN
  Administered 2019-11-14 (×4): 25 ug via INTRAVENOUS

## 2019-11-14 MED ORDER — SODIUM CHLORIDE 0.9 % IV SOLN
INTRAVENOUS | Status: AC
  Filled 2019-11-14: qty 2

## 2019-11-14 MED ORDER — SODIUM CHLORIDE 0.9 % IV SOLN
80.0000 mg | INTRAVENOUS | Status: AC
  Administered 2019-11-14: 80 mg

## 2019-11-14 SURGICAL SUPPLY — 5 items
CABLE SURGICAL S-101-97-12 (CABLE) ×3 IMPLANT
ICD EVERA XT MRI DF1  DDMB1D1 (ICD Generator) ×2 IMPLANT
ICD EVERA XT MRI DF1 DDMB1D1 (ICD Generator) IMPLANT
PAD PRO RADIOLUCENT 2001M-C (PAD) ×3 IMPLANT
TRAY PACEMAKER INSERTION (PACKS) ×3 IMPLANT

## 2019-11-14 NOTE — H&P (Signed)
Patient Care Team: Patient, No Pcp Per as PCP - General (General Practice)   HPI  Sydney Shaffer is a 53 y.o. female with hx of Primary prevention ICD 2-13 for NICM  Recurrent atrrial arrhythmia on amiodarone  Recurrent VT Rx with ATP (8/19)  The patient denies chest pain, shortness of breath, nocturnal dyspnea, orthopnea or peripheral edema.  There have been no palpitations, lightheadedness or syncope.    DATE TEST EF   7/13 Echo  20-25 %   2/15 Echo 20-25 %   10/17 Echo  30-35   10/17 Cath  Normal CA  6/19 Echo  30-35%       Date Cr K Hgb TSH LFTs PFTs  9/17      2.555 22    1/18 0.83 4.5         9/19 0.78 4.6 12.8(4/19) 2.9 15               Records and Results Reviewed   Past Medical History:  Diagnosis Date  . Anemia    felt to be due to heavy menstrual flow  . Atrial tachycardia (HCC)    s/p ablation  . AVNRT (AV nodal re-entry tachycardia) ([place])    s/p ablation  . CHF (congestive heart failure) ([place])    Dx 03/2012 - dilated cardiomyopathy with EF 20-25% by echo (abnl nuc but normal coronaries 04/02/12 per cath.   . Chronic systolic heart failure ([place])   . Dysrhythmia    Bradycardia  . Hypertension   . Hypertensive heart disease with CHF ([place])   . ICD (implantable cardiac defibrillator) in place 08/13/2012    Past Surgical History:  Procedure Laterality Date  . APPENDECTOMY    . CARDIAC CATHETERIZATION  April 2013   normal coronaries  . CARDIAC CATHETERIZATION [name] 09/12/2016   Procedure: Right/Left Heart Cath and Coronary Angiography;  Surgeon: Jolaine Artist, MD;  Location: [facility];  Service: Cardiovascular;  Laterality: [name];  . DILITATION & CURRETTAGE/HYSTROSCOPY WITH VERSAPOINT RESECTION [name] 10/05/2015   Procedure: DILATATION & CURETTAGE/HYSTEROSCOPY WITH VERSAPOINT RESECTION;  Surgeon: [name], MD;  Location: [facility];  Service: Gynecology;  Laterality: [name];  . EP study and ablation  01/07/13   Ablation  of AVNRT and atrial tachycardia (arising from the anteroseptal RA 48mm above the HIS)  . ICD  08/13/2012  . IMPLANTABLE CARDIOVERTER DEFIBRILLATOR IMPLANT [name] 08/13/2012   Procedure: IMPLANTABLE CARDIOVERTER DEFIBRILLATOR IMPLANT;  Surgeon: Deboraha Sprang, MD;  Location: [facility] C[facility] LAB;  Service: Cardiovascular;  Laterality: [name];  . LEFT HEART CATHETERIZATION WITH CORONARY ANGIOGRAM [name] 04/02/2012   Procedure: LEFT HEART CATHETERIZATION WITH CORONARY ANGIOGRAM;  Surgeon: Peter M Martinique, MD;  Location: [facility] CATH LAB;  Service: Cardiovascular;  Laterality: [name];  . SUPRAVENTRICULAR TACHYCARDIA ABLATION [name] 01/07/2013   Procedure: SUPRAVENTRICULAR TACHYCARDIA ABLATION;  Surgeon: Thompson Grayer, MD;  Location: [facility] CATH LAB;  Service: Cardiovascular;  Laterality: [name];  . TUBAL LIGATION      Current Facility-Administered Medications  Medication Dose Route Frequency Provider Last Rate Last Dose  . 0.9 %  sodium chloride infusion   Intravenous Continuous Deboraha Sprang, MD 50 mL/hr at 11/14/19 [phone]    . ceFAZolin (ANCEF) IVPB 2g/100 mL premix  2 g Intravenous On Call Deboraha Sprang, MD      . chlorhexidine (HIBICLENS) 4 % liquid 4 application  60 mL Topical Once Deboraha Sprang, MD      . chlorhexidine (HIBICLENS) 4 % liquid 4  application  60 mL Topical Once Deboraha Sprang, MD      . gentamicin (GARAMYCIN) 80 mg in sodium chloride 0.9 % 500 mL irrigation  80 mg Irrigation On Call Deboraha Sprang, MD        No Known Allergies    Social History   Tobacco Use  . Smoking status: Never Smoker  . Smokeless tobacco: Never Used  Substance Use Topics  . Alcohol use: Yes    Alcohol/week: 1.0 standard drinks    Types: 1 Glasses of wine per week    Comment: daily  . Drug use: No     Family History  Problem Relation Age of Onset  . Breast cancer Mother   . Cancer Father      Current Meds  Medication Sig  . amiodarone (PACERONE) 200 MG tablet Take 1 tablet (200 mg total) by mouth daily.  . carvedilol  (COREG) 12.5 MG tablet Take 12.5 mg by mouth 2 (two) times daily with a meal.  . dapagliflozin propanediol (FARXIGA) 10 MG TABS tablet Take 10 mg by mouth daily before breakfast.  . Doxylamine Succinate, Sleep, (SLEEP AID PO) Take 1 tablet by mouth at bedtime as needed (sleep).   . ferrous sulfate 325 (65 FE) MG tablet Take 325 mg by mouth daily with breakfast.  . furosemide (LASIX) 40 MG tablet Take 1.5 tablets (60 mg total) by mouth 2 (two) times daily.  . Multiple Vitamin (MULTIVITAMIN WITH MINERALS) TABS Take 1 tablet by mouth daily.  . potassium chloride SA (KLOR-CON M20) 20 MEQ tablet TAKE 1 TABLET (20 MEQ TOTAL) BY MOUTH 2 (TWO) TIMES DAILY.  . sacubitril-valsartan (ENTRESTO) 97-103 MG Take 1 tablet by mouth 2 (two) times daily.  Marland Kitchen spironolactone (ALDACTONE) 25 MG tablet Take 1 tablet (25 mg total) by mouth daily.  . [DISCONTINUED] carvedilol (COREG) 12.5 MG tablet TAKE 1 TABLET(12.5 MG) BY MOUTH TWICE DAILY (Patient taking differently: Take 12.5 mg by mouth 2 (two) times daily with a meal. )     Review of Systems negative except from HPI and PMH  Physical Exam BP 123/77   Pulse 71   Temp 98.3 F (36.8 C) (Oral)   Resp 14   Ht 5\' 7"  (1.702 m)   Wt 89.8 kg   SpO2 99%   BMI 31.01 kg/m  Well developed and well nourished in no acute distress HENT normal E scleral and icterus clear Neck Supple JVP flat; carotids brisk and full Clear to ausculation Regular rate and rhythm, no murmurs gallops or rub Soft with active bowel sounds No clubbing cyanosis { Edema Alert and oriented, grossly normal motor and sensory function Skin Warm and Dry    Assessment and  Plan   Nonischemic cardiomyopathy  Atrial Tachycardia/Flutter (CL310)  ICD Medtronic    CHF chronic systolic  Suprventricular tachycardia-CL 330     Device at ERI  For gen change today  We have reviewed the benefits and risks of generator replacement.  These include but are not limited to lead fracture and  infection.  The patient understands, agrees and is willing to proceed.

## 2019-11-14 NOTE — Interval H&P Note (Signed)
ICD Criteria  Current LVEF:33%. Within 12 months prior to implant: No   Heart failure history: Yes, Class II  Cardiomyopathy history: Yes, Ischemic Cardiomyopathy - Prior MI.  Atrial Fibrillation/Atrial Flutter: No.  Ventricular tachycardia history: Yes, Hemodynamic instability present. VT Type: Sustained Ventricular Tachycardia - Monomorphic.  Cardiac arrest history: No.  History of syndromes with risk of sudden death: No.  Previous ICD: Yes, Reason for ICD:  Primary prevention.  Current ICD indication: Secondary  PPM indication: No.  Class I or II Bradycardia indication present: No  Beta Blocker therapy for 3 or more months: Yes, prescribed.   Ace Inhibitor/ARB therapy for 3 or more months: Yes, prescribed.    I have seen Sydney Shaffer is a 53 y.o. femalepre-procedural and is seen for consideration of ICD implant for now secondary  prevention of sudden death.  The patient's chart has been reviewed and they meet criteria for ICD implant.  I have had a thorough discussion with the patient reviewing options.  The patient and their family (if available) have had opportunities to ask questions and have them answered. The patient and I have decided together through the [place] [employer] to reimplant ICD at this time.  Risks, benefits, alternatives to ICD implantation were discussed in detail with the patient today. The patient  understands that the risks include but are not limited to bleeding, infection, pneumothorax, perforation, tamponade, vascular damage, renal failure, MI, stroke, death, inappropriate shocks, and lead dislodgement and  wishes to proceed.  History and Physical Interval Note:  11/14/2019 9:17 AM  Sydney Shaffer  has presented today for surgery, with the diagnosis of ERI.  The various methods of treatment have been discussed with the patient and family. After consideration of risks, benefits and other options for treatment, the patient has  consented to  Procedure(s): ICD [place] ([name]) as a surgical intervention.  The patient's history has been reviewed, patient examined, no change in status, stable for surgery.  I have reviewed the patient's chart and labs.  Questions were answered to the patient's satisfaction.     Virl Axe

## 2019-11-14 NOTE — Progress Notes (Signed)
Patient informed of negative PCr swab

## 2019-11-14 NOTE — Discharge Instructions (Signed)
Implantable Cardiac Device Battery Change, Care After This sheet gives you information about how to care for yourself after your procedure. Your health care provider may also give you more specific instructions. If you have problems or questions, contact your health care provider. What can I expect after the procedure? After your procedure, it is common to have:  Pain or soreness at the site where the cardiac device was inserted.  Swelling at the site where the cardiac device was inserted.  You should received an information card for your new device in 4-8 weeks. Follow these instructions at home: Incision care   Keep the incision clean and dry. ? Do not takePat the area dry with a clean towel. Do not rub the area. This may cause bleeding.  Follow instructions from your health care provider about how to take care of your incision. Make sure you: ? Leave stitches (sutures), skin glue, or adhesive strips in place. These skin closures may need to stay in place for 2 weeks or longer. If adhesive strip edges start to loosen and curl up, you may trim the loose edges. Do not remove adhesive strips completely unless your health care provider tells you to do that.  Check your incision area every day for signs of infection. Check for: ? More redness, swelling, or pain. ? More fluid or blood. ? Warmth. baths, swim, or use a hot tub until after your wound check.  ? Do not shower for 1 day, or as directed by your health care provider. ? Pus or a bad smell. Activity  Do not lift anything that is heavier than 10 lb (4.5 kg) until your health care provider says it is okay to do so.  For the first week, or as long as told by your health care provider: ? Avoid lifting your affected arm higher than your shoulder. ? After 1 week, Be gentle when you move your arms over your head. It is okay to raise your arm to comb your hair. ? Avoid strenuous exercise.  Ask your health care provider when it is okay  to: ? Resume your normal activities. ? Return to work or school. ? Resume sexual activity. Eating and drinking  Eat a heart-healthy diet. This should include plenty of fresh fruits and vegetables, whole grains, low-fat dairy products, and lean protein like chicken and fish.  Limit alcohol intake to no more than 1 drink a day for non-pregnant women and 2 drinks a day for men. One drink equals 12 oz of beer, 5 oz of wine, or 1 oz of hard liquor.  Check ingredients and nutrition facts on packaged foods and beverages. Avoid the following types of food: ? Food that is high in salt (sodium). ? Food that is high in saturated fat, like full-fat dairy or red meat. ? Food that is high in trans fat, like fried food. ? Food and drinks that are high in sugar. Lifestyle  Do not use any products that contain nicotine or tobacco, such as cigarettes and e-cigarettes. If you need help quitting, ask your health care provider.  Take steps to manage and control your weight.  Once cleared, get regular exercise. Aim for 150 minutes of moderate-intensity exercise (such as walking or yoga) or 75 minutes of vigorous exercise (such as running or swimming) each week.  Manage other health problems, such as diabetes or high blood pressure. Ask your health care provider how you can manage these conditions. General instructions  Do not drive for 4 days  after your procedure.  Take over-the-counter and prescription medicines only as told by your health care provider.  Avoid putting pressure on the area where the cardiac device was placed.  If you need an MRI after your cardiac device has been placed, be sure to tell the health care provider who orders the MRI that you have a cardiac device.  Avoid close and prolonged exposure to electrical devices that have strong magnetic fields. These include: ? Cell phones. Avoid keeping them in a pocket near the cardiac device, and try using the ear opposite the cardiac  device. ? MP3 players. ? Household appliances, like microwaves. ? Metal detectors. ? Electric generators. ? High-tension wires.  Keep all follow-up visits as directed by your health care provider. This is important. Contact a health care provider if:  You have pain at the incision site that is not relieved by over-the-counter or prescription medicines.  You have any of these around your incision site or coming from it: ? More redness, swelling, or pain. ? Fluid or blood. ? Warmth to the touch. ? Pus or a bad smell.  You have a fever.  You feel brief, occasional palpitations, light-headedness, or any symptoms that you think might be related to your heart. Get help right away if:  You experience chest pain that is different from the pain at the cardiac device site.  You develop a red streak that extends above or below the incision site.  You experience shortness of breath.  You have palpitations or an irregular heartbeat.  You have light-headedness that does not go away quickly.  You faint or have dizzy spells.  Your pulse suddenly drops or increases rapidly and does not return to normal.  You begin to gain weight and your legs and ankles swell. Summary  After your procedure, it is common to have pain, soreness, and some swelling where the cardiac device was inserted.  Make sure to keep your incision clean and dry. Follow instructions from your health care provider about how to take care of your incision.  Check your incision every day for signs of infection, such as more pain or swelling, pus or a bad smell, warmth, or leaking fluid and blood.  Avoid strenuous exercise and lifting your left arm higher than your shoulder for 2 weeks, or as long as told by your health care provider. This information is not intended to replace advice given to you by your health care provider. Make sure you discuss any questions you have with your health care provider.

## 2019-11-15 MED FILL — Gentamicin Sulfate Inj 40 MG/ML: INTRAMUSCULAR | Qty: 80 | Status: AC

## 2019-11-15 MED FILL — Lidocaine HCl Local Inj 1%: INTRAMUSCULAR | Qty: 60 | Status: AC

## 2019-11-15 MED FILL — Cefazolin Sodium-Dextrose IV Solution 2 GM/100ML-4%: INTRAVENOUS | Qty: 100 | Status: AC

## 2019-11-24 ENCOUNTER — Other Ambulatory Visit: Payer: Self-pay

## 2019-11-24 ENCOUNTER — Ambulatory Visit (INDEPENDENT_AMBULATORY_CARE_PROVIDER_SITE_OTHER): Payer: Self-pay | Admitting: *Deleted

## 2019-11-24 DIAGNOSIS — I420 Dilated cardiomyopathy: Secondary | ICD-10-CM

## 2019-11-24 NOTE — Patient Instructions (Signed)
Call device clinic if you have any drainage, swelling, or bleeding from incision site.

## 2019-11-27 NOTE — Addendum Note (Signed)
Addended by: Patsey Berthold on: 11/27/2019 07:48 PM   Modules accepted: Level of Service

## 2019-11-29 LAB — CUP PACEART INCLINIC DEVICE CHECK
Battery Remaining Longevity: 134 mo
Battery Voltage: 3.16 V
Brady Statistic AP VP Percent: 0 %
Brady Statistic AP VS Percent: 0.06 %
Brady Statistic AS VP Percent: 0.04 %
Brady Statistic AS VS Percent: 99.89 %
Brady Statistic RA Percent Paced: 0.07 %
Brady Statistic RV Percent Paced: 0.05 %
Date Time Interrogation Session: 20201217161700
HighPow Impedance: 68 Ohm
Implantable Lead Implant Date: 20130906
Implantable Lead Implant Date: 20130906
Implantable Lead Location: 753859
Implantable Lead Location: 753860
Implantable Lead Model: 181
Implantable Lead Model: 5076
Implantable Lead Serial Number: 322962
Implantable Pulse Generator Implant Date: 20201207
Lead Channel Impedance Value: 342 Ohm
Lead Channel Impedance Value: 342 Ohm
Lead Channel Impedance Value: 418 Ohm
Lead Channel Pacing Threshold Amplitude: 0.5 V
Lead Channel Pacing Threshold Amplitude: 0.875 V
Lead Channel Pacing Threshold Pulse Width: 0.4 ms
Lead Channel Pacing Threshold Pulse Width: 0.4 ms
Lead Channel Sensing Intrinsic Amplitude: 14.875 mV
Lead Channel Sensing Intrinsic Amplitude: 17.125 mV
Lead Channel Sensing Intrinsic Amplitude: 2.125 mV
Lead Channel Sensing Intrinsic Amplitude: 2.25 mV
Lead Channel Setting Pacing Amplitude: 1.5 V
Lead Channel Setting Pacing Amplitude: 2.5 V
Lead Channel Setting Pacing Pulse Width: 0.4 ms
Lead Channel Setting Sensing Sensitivity: 0.3 mV

## 2019-11-29 NOTE — Progress Notes (Signed)
ICD wound check in clinic. Dermabond removed from incision site. wound edges approximated with no redness, drainage or edema. Normal device function. Thresholds and sensing consistent with previous device measurements. Impedance trends stable over time. No mode switches. No ventricular arrhythmias. Histogram distribution appropriate for patient and level of activity. No changes made this session. Device programmed at appropriate safety margins. Device programmed to optimize intrinsic conduction. Estimated longevity 11 yrs 2 months. Pt enrolled in remote follow-up and next remote 02/23/20. Follow up with DR Caryl Comes 01/25/20. Patient education completed including shock plan.

## 2019-11-30 ENCOUNTER — Other Ambulatory Visit: Payer: Self-pay

## 2019-12-12 ENCOUNTER — Other Ambulatory Visit (HOSPITAL_COMMUNITY): Payer: Self-pay

## 2019-12-12 MED ORDER — FARXIGA 10 MG PO TABS: 10.0000 mg | ORAL_TABLET | Freq: Every day | ORAL | 11 refills | Status: DC

## 2019-12-13 ENCOUNTER — Telehealth: Payer: Self-pay

## 2019-12-13 NOTE — Telephone Encounter (Signed)
Fax received from covernymeds and [place] [phone]) concerning a [name] for this pt. Key [id number] Name Sydney Shaffer 23-Apr-1966  The pt sees Dr Haroldine Laws at our CHF clinic. I will forward this note to his nurse.

## 2019-12-13 NOTE — Telephone Encounter (Signed)
Sydney Shaffer will you please look into this, I don't think she has insurance and I had her complete and faxed the pt assistance form in at her last OV on 12/2

## 2019-12-14 ENCOUNTER — Telehealth (HOSPITAL_COMMUNITY): Payer: Self-pay | Admitting: Pharmacist

## 2019-12-14 NOTE — Telephone Encounter (Signed)
Per Audry Riles  it appears Ms. Steuart does not have insurance. I am not sure why the PA was generated, but no specific form was attached to it, so it can be ignored. I called [employer] to check on the status of pt assistance application. They said they need proof of income so application is on hold. I left her a voicemail telling her she needs proof of income. If you end up talking to her before me, I would fill the [place] at the [facility] outpatient pharmacy under the [place]. It is a recent addition to the HF fund.

## 2019-12-14 NOTE — Telephone Encounter (Signed)
Called [employer] to check on status of Farxiga patient assistance application. The application is currently on hold because proof of income is needed. Left VM for patient explaining what documents would satisfy the requirement (I.e. W2, pay stubs, 1040, social security statement). Will follow up.  Audry Riles, PharmD, BCPS, BCCP, CPP Heart Failure Clinic Pharmacist [phone]

## 2019-12-23 NOTE — Telephone Encounter (Signed)
Provided patient with Farxiga samples. Patient will send in income documentation to [employer] today.  Medication: Farxiga 10 mg Quantity: 14 (2 boxes)  Lot: [id number] Expiration date: 04/2021  Dropped samples off at front desk for patient.   Audry Riles, PharmD, BCPS, CPP Heart Failure Clinic Pharmacist [phone]

## 2020-01-25 ENCOUNTER — Other Ambulatory Visit: Payer: Self-pay | Admitting: Internal Medicine

## 2020-01-25 ENCOUNTER — Encounter: Payer: Self-pay | Admitting: Internal Medicine

## 2020-02-15 ENCOUNTER — Other Ambulatory Visit (HOSPITAL_COMMUNITY): Payer: Self-pay

## 2020-02-15 DIAGNOSIS — I5022 Chronic systolic (congestive) heart failure: Secondary | ICD-10-CM

## 2020-02-15 MED ORDER — FUROSEMIDE 40 MG PO TABS: 60.0000 mg | ORAL_TABLET | Freq: Two times a day (BID) | ORAL | 5 refills | Status: DC

## 2020-02-23 ENCOUNTER — Ambulatory Visit (INDEPENDENT_AMBULATORY_CARE_PROVIDER_SITE_OTHER): Payer: Self-pay | Admitting: *Deleted

## 2020-02-23 DIAGNOSIS — I420 Dilated cardiomyopathy: Secondary | ICD-10-CM

## 2020-03-05 ENCOUNTER — Encounter: Payer: Self-pay | Admitting: Internal Medicine

## 2020-03-06 LAB — CUP PACEART REMOTE DEVICE CHECK
Battery Remaining Longevity: 132 mo
Battery Voltage: 3.14 V
Brady Statistic AP VP Percent: 0 %
Brady Statistic AP VS Percent: 0.1 %
Brady Statistic AS VP Percent: 0.03 %
Brady Statistic AS VS Percent: 99.87 %
Brady Statistic RA Percent Paced: 0.1 %
Brady Statistic RV Percent Paced: 0.03 %
Date Time Interrogation Session: 20210319131103
HighPow Impedance: 70 Ohm
Implantable Lead Implant Date: 20130906
Implantable Lead Implant Date: 20130906
Implantable Lead Location: 753859
Implantable Lead Location: 753860
Implantable Lead Model: 181
Implantable Lead Model: 5076
Implantable Lead Serial Number: 322962
Implantable Pulse Generator Implant Date: 20201207
Lead Channel Impedance Value: 304 Ohm
Lead Channel Impedance Value: 304 Ohm
Lead Channel Impedance Value: 399 Ohm
Lead Channel Pacing Threshold Amplitude: 0.5 V
Lead Channel Pacing Threshold Amplitude: 0.875 V
Lead Channel Pacing Threshold Pulse Width: 0.4 ms
Lead Channel Pacing Threshold Pulse Width: 0.4 ms
Lead Channel Sensing Intrinsic Amplitude: 1.75 mV
Lead Channel Sensing Intrinsic Amplitude: 1.75 mV
Lead Channel Sensing Intrinsic Amplitude: 12 mV
Lead Channel Sensing Intrinsic Amplitude: 12 mV
Lead Channel Setting Pacing Amplitude: 1.5 V
Lead Channel Setting Pacing Amplitude: 2.5 V
Lead Channel Setting Pacing Pulse Width: 0.4 ms
Lead Channel Setting Sensing Sensitivity: 0.3 mV

## 2020-03-06 NOTE — Progress Notes (Signed)
ICD Remote  

## 2020-03-20 ENCOUNTER — Other Ambulatory Visit: Payer: Self-pay

## 2020-03-20 ENCOUNTER — Ambulatory Visit (INDEPENDENT_AMBULATORY_CARE_PROVIDER_SITE_OTHER): Payer: Self-pay | Admitting: Internal Medicine

## 2020-03-20 ENCOUNTER — Encounter: Payer: Self-pay | Admitting: Internal Medicine

## 2020-03-20 VITALS — BP 122/88 | HR 78 | Ht 67.0 in | Wt 206.0 lb

## 2020-03-20 DIAGNOSIS — I420 Dilated cardiomyopathy: Secondary | ICD-10-CM

## 2020-03-20 DIAGNOSIS — E039 Hypothyroidism, unspecified: Secondary | ICD-10-CM

## 2020-03-20 DIAGNOSIS — Z95810 Presence of automatic (implantable) cardiac defibrillator: Secondary | ICD-10-CM

## 2020-03-20 DIAGNOSIS — Z79899 Other long term (current) drug therapy: Secondary | ICD-10-CM

## 2020-03-20 DIAGNOSIS — I5022 Chronic systolic (congestive) heart failure: Secondary | ICD-10-CM

## 2020-03-20 MED ORDER — AMIODARONE HCL 200 MG PO TABS: 200.0000 mg | ORAL_TABLET | Freq: Every day | ORAL | 3 refills | Status: DC

## 2020-03-20 NOTE — Progress Notes (Signed)
Patient Care Team: Patient, No Pcp Per as PCP - General (General Practice)   HPI  Sydney Shaffer is a 54 y.o. female in follow-up for an ICD Medtronic  implant initially 2013 for primary prevention in the setting of nonischemic cardiomyopathy.  GEN change 1220  She has had SVT and underwent ablation of the septal atrial tachycardia as well as slow pathway.  Recurrent atrial arrhythmias 2017 and ventricular tachycardia treated with amiodarone.  Has been maintained on amiodarone.  Occasional palpitations   Patient denies symptoms of GI intolerance, sun sensitivity, neurological symptoms attributable to amiodarone.    DATE TEST EF   7/13 Echo  20-25 %   2/15 Echo 20-25 %   10/17 Echo  30-35   10/17 Cath  Normal CA  6/19 Echo  30-35%       Date Cr K Hgb TSH LFTs PFTs  9/17      2.555 22    1/18 0.83 4.5         9/19 0.78 4.6 12.8(4/19) 2.9 15   12/20 1.97 3.8 13.6 1.46(9/20) 21       Records and Results Reviewed   Past Medical History:  Diagnosis Date  . Anemia    felt to be due to heavy menstrual flow  . Atrial tachycardia (HCC)    s/p ablation  . AVNRT (AV nodal re-entry tachycardia) ([place])    s/p ablation  . CHF (congestive heart failure) ([place])    Dx 03/2012 - dilated cardiomyopathy with EF 20-25% by echo (abnl nuc but normal coronaries 04/02/12 per cath.   . Chronic systolic heart failure ([place])   . Dysrhythmia    Bradycardia  . Hypertension   . Hypertensive heart disease with CHF ([place])   . ICD (implantable cardiac defibrillator) in place 08/13/2012    Past Surgical History:  Procedure Laterality Date  . APPENDECTOMY    . CARDIAC CATHETERIZATION  April 2013   normal coronaries  . CARDIAC CATHETERIZATION [name] 09/12/2016   Procedure: Right/Left Heart Cath and Coronary Angiography;  Surgeon: Jolaine Artist, MD;  Location: [facility];  Service: Cardiovascular;  Laterality: [name];  . DILITATION & CURRETTAGE/HYSTROSCOPY WITH VERSAPOINT  RESECTION [name] 10/05/2015   Procedure: DILATATION & CURETTAGE/HYSTEROSCOPY WITH VERSAPOINT RESECTION;  Surgeon: [name], MD;  Location: [facility];  Service: Gynecology;  Laterality: [name];  . EP study and ablation  01/07/13   Ablation of AVNRT and atrial tachycardia (arising from the anteroseptal RA 50mm above the HIS)  . ICD  08/13/2012  . ICD GENERATOR CHANGEOUT [name] 11/14/2019   Procedure: ICD GENERATOR CHANGEOUT;  Surgeon: Deboraha Sprang, MD;  Location: [facility];  Service: Cardiovascular;  Laterality: [name];  . IMPLANTABLE CARDIOVERTER DEFIBRILLATOR IMPLANT [name] 08/13/2012   Procedure: IMPLANTABLE CARDIOVERTER DEFIBRILLATOR IMPLANT;  Surgeon: Deboraha Sprang, MD;  Location: [facility] CATH LAB;  Service: Cardiovascular;  Laterality: [name];  . LEFT HEART CATHETERIZATION WITH CORONARY ANGIOGRAM [name] 04/02/2012   Procedure: LEFT HEART CATHETERIZATION WITH CORONARY ANGIOGRAM;  Surgeon: Peter M Martinique, MD;  Location: [facility] CATH LAB;  Service: Cardiovascular;  Laterality: [name];  . SUPRAVENTRICULAR TACHYCARDIA ABLATION [name] 01/07/2013   Procedure: SUPRAVENTRICULAR TACHYCARDIA ABLATION;  Surgeon: Thompson Grayer, MD;  Location: [facility] C[facility] LAB;  Service: Cardiovascular;  Laterality: [name];  . TUBAL LIGATION      Current Meds  Medication Sig  . amiodarone (PACERONE) 200 MG tablet Take 1 tablet (200 mg total) by mouth daily.  . carvedilol (COREG) 12.5 MG  tablet Take 12.5 mg by mouth 2 (two) times daily with a meal.  . dapagliflozin propanediol (FARXIGA) 10 MG TABS tablet Take 10 mg by mouth daily before breakfast.  . Doxylamine Succinate, Sleep, (SLEEP AID PO) Take 1 tablet by mouth at bedtime as needed (sleep).   . ferrous sulfate 325 (65 FE) MG tablet Take 325 mg by mouth daily with breakfast.  . furosemide (LASIX) 40 MG tablet Take 1.5 tablets (60 mg total) by mouth 2 (two) times daily.  . Multiple Vitamin (MULTIVITAMIN WITH MINERALS) TABS Take 1 tablet by mouth daily.  . potassium chloride SA (KLOR-CON M20) 20 MEQ  tablet TAKE 1 TABLET (20 MEQ TOTAL) BY MOUTH 2 (TWO) TIMES DAILY.  . sacubitril-valsartan (ENTRESTO) 97-103 MG Take 1 tablet by mouth 2 (two) times daily.  Marland Kitchen spironolactone (ALDACTONE) 25 MG tablet TAKE 1 TABLET(25 MG) BY MOUTH DAILY    No Known Allergies    Review of Systems negative except from HPI and PMH  Physical Exam BP 122/88   Pulse 78   Ht 5\' 7"  (1.702 m)   Wt 206 lb (93.4 kg)   SpO2 97%   BMI 32.26 kg/m  Well developed and well nourished in no acute distress HENT normal E scleral and icterus clear Neck Supple JVP flat; carotids brisk and full Clear to ausculation  Regular rate and rhythm, no murmurs gallops or rub Soft with active bowel sounds No clubbing cyanosis  Edema Alert and oriented, grossly normal motor and sensory function Skin Warm and Dry  ECG  Sinus @ 80 14/06/38  CrCl cannot be calculated (Patient's most recent lab result is older than the maximum 21 days allowed.).   Assessment and  Plan  Nonischemic cardiomyopathy  Atrial Tachycardia/Flutter (CL310)  ICD Medtronic The patient's device was interrogated.  The information was reviewed. No changes were made in the programming.      CHF chronic systolic  High Risk Medication Surveillance amiodarone  occ flutters  Euvolemic continue current meds  No intercurrent Ventricular tachycardia  Will decrease amio to 5 d week   Check amio surveillance labs     Current medicines are reviewed at length with the patient today .  The patient does not  have concerns regarding medicines.

## 2020-03-20 NOTE — Patient Instructions (Addendum)
Medication Instructions:  Your physician has recommended you make the following change in your medication:   Begin taking Amiodarone 200mg  - 1 tablet by mouth daily Monday through Friday.   Labwork: You will have labs drawn today: TSH and Liver function   Testing/Procedures: None ordered.  Follow-Up: Your physician wants you to follow-up in: 9 months with Dr Caryl Comes. You will receive a reminder letter in the mail two months in advance. If you don't receive a letter, please call our office to schedule the follow-up appointment.  Remote monitoring is used to monitor your Pacemaker of ICD from home. This monitoring reduces the number of office visits required to check your device to one time per year. It allows [place] to keep an eye on the functioning of your device to ensure it is working properly.   Any Other Special Instructions Will Be Listed Below (If Applicable).  If you need a refill on your cardiac medications before your next appointment, please call your pharmacy.

## 2020-03-21 LAB — HEPATIC FUNCTION PANEL
ALT: 19 IU/L (ref 0–32)
AST: 16 IU/L (ref 0–40)
Albumin: 4.2 g/dL (ref 3.8–4.9)
Alkaline Phosphatase: 92 IU/L (ref 39–117)
Bilirubin Total: 0.4 mg/dL (ref 0.0–1.2)
Bilirubin, Direct: 0.13 mg/dL (ref 0.00–0.40)
Total Protein: 7.6 g/dL (ref 6.0–8.5)

## 2020-03-21 LAB — TSH: TSH: 1.62 u[IU]/mL (ref 0.450–4.500)

## 2020-04-06 ENCOUNTER — Other Ambulatory Visit: Payer: Self-pay | Admitting: Internal Medicine

## 2020-04-06 LAB — CUP PACEART INCLINIC DEVICE CHECK
Battery Remaining Longevity: 131 mo
Battery Voltage: 3.13 V
Brady Statistic AP VP Percent: 0 %
Brady Statistic AP VS Percent: 0.06 %
Brady Statistic AS VP Percent: 0.03 %
Brady Statistic AS VS Percent: 99.91 %
Brady Statistic RA Percent Paced: 0.06 %
Brady Statistic RV Percent Paced: 0.03 %
Date Time Interrogation Session: 20210413205200
HighPow Impedance: 66 Ohm
Implantable Lead Implant Date: 20130906
Implantable Lead Implant Date: 20130906
Implantable Lead Location: 753859
Implantable Lead Location: 753860
Implantable Lead Model: 181
Implantable Lead Model: 5076
Implantable Lead Serial Number: 322962
Implantable Pulse Generator Implant Date: 20201207
Lead Channel Impedance Value: 304 Ohm
Lead Channel Impedance Value: 342 Ohm
Lead Channel Impedance Value: 418 Ohm
Lead Channel Pacing Threshold Amplitude: 0.5 V
Lead Channel Pacing Threshold Amplitude: 1 V
Lead Channel Pacing Threshold Pulse Width: 0.4 ms
Lead Channel Pacing Threshold Pulse Width: 0.4 ms
Lead Channel Sensing Intrinsic Amplitude: 14.5 mV
Lead Channel Sensing Intrinsic Amplitude: 2.25 mV
Lead Channel Setting Pacing Amplitude: 1.5 V
Lead Channel Setting Pacing Amplitude: 2.5 V
Lead Channel Setting Pacing Pulse Width: 0.4 ms
Lead Channel Setting Sensing Sensitivity: 0.3 mV

## 2020-04-09 ENCOUNTER — Other Ambulatory Visit (HOSPITAL_COMMUNITY): Payer: Self-pay

## 2020-04-09 MED ORDER — ENTRESTO 97-103 MG PO TABS: 1.0000 | ORAL_TABLET | Freq: Two times a day (BID) | ORAL | 3 refills | Status: DC

## 2020-04-16 ENCOUNTER — Telehealth: Payer: Self-pay | Admitting: Internal Medicine

## 2020-04-16 NOTE — Telephone Encounter (Signed)
Patient returning call for lab results. 

## 2020-04-16 NOTE — Telephone Encounter (Signed)
Spoke with pt and advised per Dr Caryl Comes her lab work is normal.  Pt verbalizes understanding and has no further questions or concerns at this time.

## 2020-06-21 LAB — CUP PACEART REMOTE DEVICE CHECK
Battery Remaining Longevity: 130 mo
Battery Voltage: 3.07 V
Brady Statistic AP VP Percent: 0 %
Brady Statistic AP VS Percent: 0.1 %
Brady Statistic AS VP Percent: 0.03 %
Brady Statistic AS VS Percent: 99.87 %
Brady Statistic RA Percent Paced: 0.1 %
Brady Statistic RV Percent Paced: 0.03 %
Date Time Interrogation Session: 20210715134615
HighPow Impedance: 69 Ohm
Implantable Lead Implant Date: 20130906
Implantable Lead Implant Date: 20130906
Implantable Lead Location: 753859
Implantable Lead Location: 753860
Implantable Lead Model: 181
Implantable Lead Model: 5076
Implantable Lead Serial Number: 322962
Implantable Pulse Generator Implant Date: 20201207
Lead Channel Impedance Value: 304 Ohm
Lead Channel Impedance Value: 304 Ohm
Lead Channel Impedance Value: 399 Ohm
Lead Channel Pacing Threshold Amplitude: 0.5 V
Lead Channel Pacing Threshold Amplitude: 0.875 V
Lead Channel Pacing Threshold Pulse Width: 0.4 ms
Lead Channel Pacing Threshold Pulse Width: 0.4 ms
Lead Channel Sensing Intrinsic Amplitude: 12.875 mV
Lead Channel Sensing Intrinsic Amplitude: 12.875 mV
Lead Channel Sensing Intrinsic Amplitude: 2.375 mV
Lead Channel Sensing Intrinsic Amplitude: 2.375 mV
Lead Channel Setting Pacing Amplitude: 1.5 V
Lead Channel Setting Pacing Amplitude: 2.5 V
Lead Channel Setting Pacing Pulse Width: 0.4 ms
Lead Channel Setting Sensing Sensitivity: 0.3 mV

## 2020-09-20 ENCOUNTER — Ambulatory Visit (INDEPENDENT_AMBULATORY_CARE_PROVIDER_SITE_OTHER): Payer: Self-pay

## 2020-09-20 DIAGNOSIS — I420 Dilated cardiomyopathy: Secondary | ICD-10-CM

## 2020-09-20 LAB — CUP PACEART REMOTE DEVICE CHECK
Battery Remaining Longevity: 127 mo
Battery Voltage: 3.04 V
Brady Statistic AP VP Percent: 0 %
Brady Statistic AP VS Percent: 0.17 %
Brady Statistic AS VP Percent: 0.04 %
Brady Statistic AS VS Percent: 99.8 %
Brady Statistic RA Percent Paced: 0.17 %
Brady Statistic RV Percent Paced: 0.04 %
Date Time Interrogation Session: 20211014082705
HighPow Impedance: 75 Ohm
Implantable Lead Implant Date: 20130906
Implantable Lead Implant Date: 20130906
Implantable Lead Location: 753859
Implantable Lead Location: 753860
Implantable Lead Model: 181
Implantable Lead Model: 5076
Implantable Lead Serial Number: 322962
Implantable Pulse Generator Implant Date: 20201207
Lead Channel Impedance Value: 342 Ohm
Lead Channel Impedance Value: 342 Ohm
Lead Channel Impedance Value: 418 Ohm
Lead Channel Pacing Threshold Amplitude: 0.5 V
Lead Channel Pacing Threshold Amplitude: 1 V
Lead Channel Pacing Threshold Pulse Width: 0.4 ms
Lead Channel Pacing Threshold Pulse Width: 0.4 ms
Lead Channel Sensing Intrinsic Amplitude: 12.625 mV
Lead Channel Sensing Intrinsic Amplitude: 12.625 mV
Lead Channel Sensing Intrinsic Amplitude: 2.375 mV
Lead Channel Sensing Intrinsic Amplitude: 2.375 mV
Lead Channel Setting Pacing Amplitude: 1.5 V
Lead Channel Setting Pacing Amplitude: 2.5 V
Lead Channel Setting Pacing Pulse Width: 0.4 ms
Lead Channel Setting Sensing Sensitivity: 0.3 mV

## 2020-09-25 NOTE — Progress Notes (Signed)
Remote ICD transmission.   

## 2020-12-11 ENCOUNTER — Other Ambulatory Visit (HOSPITAL_COMMUNITY): Payer: Self-pay | Admitting: Internal Medicine

## 2020-12-20 ENCOUNTER — Ambulatory Visit (INDEPENDENT_AMBULATORY_CARE_PROVIDER_SITE_OTHER): Payer: Self-pay

## 2020-12-20 DIAGNOSIS — I420 Dilated cardiomyopathy: Secondary | ICD-10-CM

## 2020-12-20 LAB — CUP PACEART REMOTE DEVICE CHECK
Battery Remaining Longevity: 125 mo
Battery Voltage: 3.03 V
Brady Statistic AP VP Percent: 0 %
Brady Statistic AP VS Percent: 0.05 %
Brady Statistic AS VP Percent: 0.04 %
Brady Statistic AS VS Percent: 99.91 %
Brady Statistic RA Percent Paced: 0.05 %
Brady Statistic RV Percent Paced: 0.04 %
Date Time Interrogation Session: 20220113052606
HighPow Impedance: 77 Ohm
Implantable Lead Implant Date: 20130906
Implantable Lead Implant Date: 20130906
Implantable Lead Location: 753859
Implantable Lead Location: 753860
Implantable Lead Model: 181
Implantable Lead Model: 5076
Implantable Lead Serial Number: 322962
Implantable Pulse Generator Implant Date: 20201207
Lead Channel Impedance Value: 342 Ohm
Lead Channel Impedance Value: 342 Ohm
Lead Channel Impedance Value: 399 Ohm
Lead Channel Pacing Threshold Amplitude: 0.5 V
Lead Channel Pacing Threshold Amplitude: 0.875 V
Lead Channel Pacing Threshold Pulse Width: 0.4 ms
Lead Channel Pacing Threshold Pulse Width: 0.4 ms
Lead Channel Sensing Intrinsic Amplitude: 13 mV
Lead Channel Sensing Intrinsic Amplitude: 13 mV
Lead Channel Sensing Intrinsic Amplitude: 2.25 mV
Lead Channel Sensing Intrinsic Amplitude: 2.25 mV
Lead Channel Setting Pacing Amplitude: 1.5 V
Lead Channel Setting Pacing Amplitude: 2.5 V
Lead Channel Setting Pacing Pulse Width: 0.4 ms
Lead Channel Setting Sensing Sensitivity: 0.3 mV

## 2021-01-03 NOTE — Progress Notes (Signed)
Remote ICD transmission.   

## 2021-01-04 ENCOUNTER — Other Ambulatory Visit: Payer: Self-pay

## 2021-01-04 ENCOUNTER — Encounter (HOSPITAL_COMMUNITY): Payer: Self-pay

## 2021-01-04 ENCOUNTER — Ambulatory Visit (HOSPITAL_COMMUNITY)
Admission: EM | Admit: 2021-01-04 | Discharge: 2021-01-04 | Disposition: A | Discharge status: Home / Self Care | Payer: BLUE CROSS/BLUE SHIELD | Attending: Medical Oncology | Admitting: Medical Oncology

## 2021-01-04 DIAGNOSIS — R6883 Chills (without fever): Secondary | ICD-10-CM | POA: Diagnosis not present

## 2021-01-04 DIAGNOSIS — U071 COVID-19: Secondary | ICD-10-CM | POA: Insufficient documentation

## 2021-01-04 DIAGNOSIS — Z9049 Acquired absence of other specified parts of digestive tract: Secondary | ICD-10-CM | POA: Diagnosis not present

## 2021-01-04 DIAGNOSIS — Z95810 Presence of automatic (implantable) cardiac defibrillator: Secondary | ICD-10-CM | POA: Insufficient documentation

## 2021-01-04 DIAGNOSIS — R109 Unspecified abdominal pain: Secondary | ICD-10-CM | POA: Diagnosis not present

## 2021-01-04 DIAGNOSIS — Z7984 Long term (current) use of oral hypoglycemic drugs: Secondary | ICD-10-CM | POA: Insufficient documentation

## 2021-01-04 DIAGNOSIS — R1031 Right lower quadrant pain: Secondary | ICD-10-CM | POA: Diagnosis not present

## 2021-01-04 DIAGNOSIS — Z79899 Other long term (current) drug therapy: Secondary | ICD-10-CM | POA: Insufficient documentation

## 2021-01-04 LAB — POCT URINALYSIS DIPSTICK, ED / UC
Bilirubin Urine: NEGATIVE
Glucose, UA: NEGATIVE mg/dL
Hgb urine dipstick: NEGATIVE
Ketones, ur: NEGATIVE mg/dL
Leukocytes,Ua: NEGATIVE
Nitrite: NEGATIVE
Protein, ur: NEGATIVE mg/dL
Specific Gravity, Urine: >=1.03 (ref 1.005–1.030)
Urobilinogen, UA: 0.2 mg/dL (ref 0.0–1.0)
pH: 5.5 (ref 5.0–8.0)

## 2021-01-04 LAB — SARS CORONAVIRUS 2 (TAT 6-24 HRS): SARS Coronavirus 2: POSITIVE — AB

## 2021-01-04 NOTE — ED Provider Notes (Signed)
[facility]    CSN: [id number] Arrival date & time: 01/04/21  1101      History   Chief Complaint Chief Complaint  Patient presents with  . Chills  . Abdominal Pain    HPI Sydney Shaffer is a 55 y.o. female.   HPI   Abdominal Pain: Patient states that she has been having cold symptoms of runny nose, nasal congestion and a mild sore throat over the past week and she has also been having some right lower quadrant abdominal pain.  Pain described as moderate in nature and failing to improve with over-the-counter pain medication.  She denies any known fevers at home, cough, shortness of breath, chest pain, loss of taste or smell, vomiting, diarrhea, constipation.  Per patient no known history of diverticulosis.  Last menstrual cycle was 5 years ago.  Of note she has had her appendix removed many years ago.   Past Medical History:  Diagnosis Date  . Anemia    felt to be due to heavy menstrual flow  . Atrial tachycardia (HCC)    s/p ablation  . AVNRT (AV nodal re-entry tachycardia) ([place])    s/p ablation  . CHF (congestive heart failure) ([place])    Dx 03/2012 - dilated cardiomyopathy with EF 20-25% by echo (abnl nuc but normal coronaries 04/02/12 per cath.   . Chronic systolic heart failure ([place])   . Dysrhythmia    Bradycardia  . Hypertension   . Hypertensive heart disease with CHF ([place])   . ICD (implantable cardiac defibrillator) in place 08/13/2012    Patient Active Problem List   Diagnosis Date Noted  . Dilated cardiomyopathy ([place]) 06/04/2015  . Symptomatic anemia 06/04/2015  . Chest pain 06/04/2015  . Abnormal uterine bleeding (AUB) 06/04/2015  . Metrorrhagia 06/04/2015  . Abdominal pain   . Hypokalemia   . Menorrhagia   . VT (ventricular tachycardia) ([place]) 07/22/2014  . SVT (supraventricular tachycardia) ([place]) 03/30/2014  . HTN (hypertension) 03/30/2014  . Chest pressure- unspecified 02/03/2014  . Atrial tachycardia ([place]) 10/01/2012  . Implantable  defibrillator-Medtronic 08/16/2012  . Hypertensive heart disease with CHF (congestive heart failure) ([place]) 08/04/2012  . Nonischemic dilated cardiomyopathy ([place]) 07/29/2012  . Chronic systolic heart failure ([place]) 05/05/2012  . Dyspnea 03/29/2012  . Anemia 03/29/2012    Past Surgical History:  Procedure Laterality Date  . APPENDECTOMY    . CARDIAC CATHETERIZATION  April 2013   normal coronaries  . CARDIAC CATHETERIZATION [name] 09/12/2016   Procedure: Right/Left Heart Cath and Coronary Angiography;  Surgeon: Jolaine Artist, MD;  Location: [facility];  Service: Cardiovascular;  Laterality: [name];  . DILITATION & CURRETTAGE/HYSTROSCOPY WITH VERSAPOINT RESECTION [name] 10/05/2015   Procedure: DILATATION & CURETTAGE/HYSTEROSCOPY WITH VERSAPOINT RESECTION;  Surgeon: [name], MD;  Location: [facility];  Service: Gynecology;  Laterality: [name];  . EP study and ablation  01/07/13   Ablation of AVNRT and atrial tachycardia (arising from the anteroseptal RA 48mm above the HIS)  . ICD  08/13/2012  . ICD GENERATOR CHANGEOUT [name] 11/14/2019   Procedure: ICD GENERATOR CHANGEOUT;  Surgeon: Deboraha Sprang, MD;  Location: [facility];  Service: Cardiovascular;  Laterality: [name];  . IMPLANTABLE CARDIOVERTER DEFIBRILLATOR IMPLANT [name] 08/13/2012   Procedure: IMPLANTABLE CARDIOVERTER DEFIBRILLATOR IMPLANT;  Surgeon: Deboraha Sprang, MD;  Location: [facility] CATH LAB;  Service: Cardiovascular;  Laterality: [name];  . LEFT HEART CATHETERIZATION WITH CORONARY ANGIOGRAM [name] 04/02/2012   Procedure: LEFT HEART CATHETERIZATION WITH CORONARY ANGIOGRAM;  Surgeon: Ander Slade  Martinique, MD;  Location: [facility] CATH LAB;  Service: Cardiovascular;  Laterality: [name];  . SUPRAVENTRICULAR TACHYCARDIA ABLATION [name] 01/07/2013   Procedure: SUPRAVENTRICULAR TACHYCARDIA ABLATION;  Surgeon: Thompson Grayer, MD;  Location: [facility] CATH LAB;  Service: Cardiovascular;  Laterality: [name];  . TUBAL LIGATION      OB History   No obstetric history on file.       Home Medications    Prior to Admission medications   Medication Sig Start Date End Date Taking? Authorizing Provider  amiodarone (PACERONE) 200 MG tablet Take 1 tablet (200 mg total) by mouth daily. Monday through Friday 03/20/20   Deboraha Sprang, MD  carvedilol (COREG) 12.5 MG tablet TAKE 1 TABLET(12.5 MG) BY MOUTH TWICE DAILY 04/06/20   Deboraha Sprang, MD  dapagliflozin propanediol (FARXIGA) 10 MG TABS tablet Take 1 tablet (10 mg total) by mouth daily. Needs appt for future refills 12/12/20   Bensimhon, Shaune Pascal, MD  Doxylamine Succinate, Sleep, (SLEEP AID PO) Take 1 tablet by mouth at bedtime as needed (sleep).     [provider]  ferrous sulfate 325 (65 FE) MG tablet Take 325 mg by mouth daily with breakfast.    [provider]  furosemide (LASIX) 40 MG tablet Take 1.5 tablets (60 mg total) by mouth 2 (two) times daily. 02/15/20   Bensimhon, Shaune Pascal, MD  Multiple Vitamin (MULTIVITAMIN WITH MINERALS) TABS Take 1 tablet by mouth daily.    [provider]  potassium chloride SA (KLOR-CON M20) 20 MEQ tablet TAKE 1 TABLET (20 MEQ TOTAL) BY MOUTH 2 (TWO) TIMES DAILY. 08/31/19   Deboraha Sprang, MD  sacubitril-valsartan (ENTRESTO) 97-103 MG Take 1 tablet by mouth 2 (two) times daily. 04/09/20   Bensimhon, Shaune Pascal, MD  spironolactone (ALDACTONE) 25 MG tablet TAKE 1 TABLET(25 MG) BY MOUTH DAILY 01/25/20   Deboraha Sprang, MD    Family History Family History  Problem Relation Age of Onset  . Breast cancer Mother   . Cancer Father     Social History Social History   Tobacco Use  . Smoking status: Never Smoker  . Smokeless tobacco: Never Used  Vaping Use  . Vaping Use: Never used  Substance Use Topics  . Alcohol use: Yes    Alcohol/week: 1.0 standard drink    Types: 1 Glasses of wine per week    Comment: daily  . Drug use: No     Allergies   Patient has no known allergies.   Review of Systems Review of Systems  As stated in HPI above  Physical  Exam Triage Vital Signs ED Triage Vitals  Enc Vitals Group     BP 01/04/21 1147 116/76     Pulse Rate 01/04/21 1147 76     Resp 01/04/21 1147 16     Temp 01/04/21 1147 99.1 F (37.3 C)     Temp Source 01/04/21 1147 Oral     SpO2 01/04/21 1147 98 %     Weight --      Height --      Head Circumference --      Peak Flow --      Pain Score 01/04/21 1148 6     Pain Loc --      Pain Edu? --      Excl. in [place]? --    No data found.  Updated Vital Signs BP 116/76 (BP Location: Left Arm)   Pulse 76   Temp 99.1 F (37.3 C) (Oral)   Resp  16   SpO2 98%   Physical Exam Vitals and nursing note reviewed.  Constitutional:      General: She is not in acute distress.    Appearance: She is not ill-appearing, toxic-appearing or diaphoretic.  HENT:     Mouth/Throat:     Mouth: Mucous membranes are moist.     Pharynx: Oropharynx is clear. No pharyngeal swelling or oropharyngeal exudate.  Eyes:     General: No scleral icterus.    Extraocular Movements: Extraocular movements intact.  Cardiovascular:     Rate and Rhythm: Normal rate and regular rhythm.     Heart sounds: Normal heart sounds. No murmur heard. No gallop.   Pulmonary:     Effort: Pulmonary effort is normal.     Breath sounds: Normal breath sounds. No stridor. No wheezing, rhonchi or rales.  Abdominal:     General: Abdomen is flat. Bowel sounds are normal. There is no distension.     Palpations: Abdomen is soft. There is no fluid wave, hepatomegaly, splenomegaly, mass or pulsatile mass.     Tenderness: There is no abdominal tenderness. There is no right CVA tenderness, left CVA tenderness, guarding or rebound. Negative signs include Murphy's sign and McBurney's sign.     Hernia: No hernia is present.  Skin:    General: Skin is warm and dry.     UC Treatments / Results  Labs (all labs ordered are listed, but only abnormal results are displayed) Labs Reviewed  SARS CORONAVIRUS 2 (TAT 6-24 HRS)  POCT URINALYSIS DIPSTICK,  ED / UC    Radiology No results found.  Procedures Procedures (including critical care time)  Medications Ordered in UC Medications - No data to display  Initial Impression / Assessment and Plan / UC Course  I have reviewed the triage vital signs and the nursing notes.  Pertinent labs & imaging results that were available during my care of the patient were reviewed by me and considered in my medical decision making (see chart for details).     New. UA pending. COVID-19 testing pending.   UPDATE: [place] is unremarkable. Given lack of guarding or tenderness to palpation of abdomen on exam I would recommend watchful waiting.  Discussed red flag symptoms that would warrant more immediate medical attention further work-up.  In the meantime we discussed Covid precautions.   Final Clinical Impressions(s) / UC Diagnoses   Final diagnoses:  Chills  Right lower quadrant abdominal pain   Discharge Instructions   None    ED Prescriptions    None     PDMP not reviewed this encounter.   Hughie Closs, [place] 01/04/21 1254

## 2021-01-04 NOTE — ED Notes (Signed)
Pt states she had congestion, runny nose on Monday, now improved. Also c/o RLQ abdominal discomfort. Denies dysuria sx.

## 2021-01-04 NOTE — ED Triage Notes (Signed)
Pt present pain on the right side of her abdomen and chills. Pt states the symptom started on Monday. Pt denies any other symptoms.

## 2021-02-06 ENCOUNTER — Other Ambulatory Visit: Payer: Self-pay | Admitting: Internal Medicine

## 2021-03-21 ENCOUNTER — Ambulatory Visit (INDEPENDENT_AMBULATORY_CARE_PROVIDER_SITE_OTHER): Payer: BLUE CROSS/BLUE SHIELD

## 2021-03-21 DIAGNOSIS — I420 Dilated cardiomyopathy: Secondary | ICD-10-CM | POA: Diagnosis not present

## 2021-03-25 LAB — CUP PACEART REMOTE DEVICE CHECK
Battery Remaining Longevity: 123 mo
Battery Voltage: 3.02 V
Brady Statistic AP VP Percent: 0 %
Brady Statistic AP VS Percent: 0.02 %
Brady Statistic AS VP Percent: 0.04 %
Brady Statistic AS VS Percent: 99.94 %
Brady Statistic RA Percent Paced: 0.02 %
Brady Statistic RV Percent Paced: 0.04 %
Date Time Interrogation Session: 20220414103823
HighPow Impedance: 68 Ohm
Implantable Lead Implant Date: 20130906
Implantable Lead Implant Date: 20130906
Implantable Lead Location: 753859
Implantable Lead Location: 753860
Implantable Lead Model: 181
Implantable Lead Model: 5076
Implantable Lead Serial Number: 322962
Implantable Pulse Generator Implant Date: 20201207
Lead Channel Impedance Value: 304 Ohm
Lead Channel Impedance Value: 304 Ohm
Lead Channel Impedance Value: 361 Ohm
Lead Channel Pacing Threshold Amplitude: 0.5 V
Lead Channel Pacing Threshold Amplitude: 1 V
Lead Channel Pacing Threshold Pulse Width: 0.4 ms
Lead Channel Pacing Threshold Pulse Width: 0.4 ms
Lead Channel Sensing Intrinsic Amplitude: 13.625 mV
Lead Channel Sensing Intrinsic Amplitude: 13.625 mV
Lead Channel Sensing Intrinsic Amplitude: 2 mV
Lead Channel Sensing Intrinsic Amplitude: 2 mV
Lead Channel Setting Pacing Amplitude: 1.5 V
Lead Channel Setting Pacing Amplitude: 2.5 V
Lead Channel Setting Pacing Pulse Width: 0.4 ms
Lead Channel Setting Sensing Sensitivity: 0.3 mV

## 2021-04-07 ENCOUNTER — Other Ambulatory Visit: Payer: Self-pay | Admitting: Internal Medicine

## 2021-04-08 NOTE — Progress Notes (Signed)
Remote ICD transmission.   

## 2021-06-20 ENCOUNTER — Ambulatory Visit (INDEPENDENT_AMBULATORY_CARE_PROVIDER_SITE_OTHER): Payer: BLUE CROSS/BLUE SHIELD

## 2021-06-20 DIAGNOSIS — I420 Dilated cardiomyopathy: Secondary | ICD-10-CM | POA: Diagnosis not present

## 2021-06-21 LAB — CUP PACEART REMOTE DEVICE CHECK
Battery Remaining Longevity: 120 mo
Battery Voltage: 3.01 V
Brady Statistic AP VP Percent: 0 %
Brady Statistic AP VS Percent: 0.05 %
Brady Statistic AS VP Percent: 0.04 %
Brady Statistic AS VS Percent: 99.92 %
Brady Statistic RA Percent Paced: 0.05 %
Brady Statistic RV Percent Paced: 0.04 %
Date Time Interrogation Session: 20220715072509
HighPow Impedance: 71 Ohm
Implantable Lead Implant Date: 20130906
Implantable Lead Implant Date: 20130906
Implantable Lead Location: 753859
Implantable Lead Location: 753860
Implantable Lead Model: 181
Implantable Lead Model: 5076
Implantable Lead Serial Number: 322962
Implantable Pulse Generator Implant Date: 20201207
Lead Channel Impedance Value: 342 Ohm
Lead Channel Impedance Value: 342 Ohm
Lead Channel Impedance Value: 399 Ohm
Lead Channel Pacing Threshold Amplitude: 0.5 V
Lead Channel Pacing Threshold Amplitude: 1 V
Lead Channel Pacing Threshold Pulse Width: 0.4 ms
Lead Channel Pacing Threshold Pulse Width: 0.4 ms
Lead Channel Sensing Intrinsic Amplitude: 13.5 mV
Lead Channel Sensing Intrinsic Amplitude: 13.5 mV
Lead Channel Sensing Intrinsic Amplitude: 2.625 mV
Lead Channel Sensing Intrinsic Amplitude: 2.625 mV
Lead Channel Setting Pacing Amplitude: 1.5 V
Lead Channel Setting Pacing Amplitude: 2.5 V
Lead Channel Setting Pacing Pulse Width: 0.4 ms
Lead Channel Setting Sensing Sensitivity: 0.3 mV

## 2021-07-13 NOTE — Progress Notes (Signed)
Remote ICD transmission.   

## 2021-09-30 ENCOUNTER — Other Ambulatory Visit: Payer: Self-pay

## 2021-09-30 MED ORDER — CARVEDILOL 12.5 MG PO TABS: 12.5000 mg | ORAL_TABLET | Freq: Two times a day (BID) | ORAL | 0 refills | Status: DC

## 2021-10-16 ENCOUNTER — Other Ambulatory Visit: Payer: Self-pay

## 2021-10-23 ENCOUNTER — Telehealth: Payer: Self-pay | Admitting: Internal Medicine

## 2021-10-23 MED ORDER — AMIODARONE HCL 200 MG PO TABS
200.0000 mg | ORAL_TABLET | Freq: Every day | ORAL | 1 refills | Status: DC
Start: 2021-10-23 — End: 2022-05-26

## 2021-10-23 MED ORDER — POTASSIUM CHLORIDE CRYS ER 20 MEQ PO TBCR: EXTENDED_RELEASE_TABLET | ORAL | 1 refills | Status: DC

## 2021-10-23 MED ORDER — CARVEDILOL 12.5 MG PO TABS: 12.5000 mg | ORAL_TABLET | Freq: Two times a day (BID) | ORAL | 1 refills | Status: DC

## 2021-10-23 MED ORDER — SPIRONOLACTONE 25 MG PO TABS: ORAL_TABLET | ORAL | 1 refills | Status: DC

## 2021-10-23 NOTE — Telephone Encounter (Signed)
Pt's medications were sent to pt's pharmacy as requested. Confirmation received.  

## 2021-10-23 NOTE — Telephone Encounter (Signed)
*  STAT* If patient is at the pharmacy, call can be transferred to refill team.   1. Which medications need to be refilled? (please list name of each medication and dose if known)  spironolactone (ALDACTONE) 25 MG tablet amiodarone (PACERONE) 200 MG tablet carvedilol (COREG) 12.5 MG tablet potassium chloride SA (KLOR-CON M20) 20 MEQ tablet  2. Which pharmacy/location (including street and city if local pharmacy) is medication to be sent to? [place], [place]  3. Do they need a 30 day or 90 day supply?   Patient states she is completely out of Potassium Chloride. She is hopeful for enough medication to last her until her appointment on 12/17/21 with Dr. Caryl Comes.

## 2021-12-17 ENCOUNTER — Encounter: Payer: BLUE CROSS/BLUE SHIELD | Admitting: Internal Medicine

## 2021-12-17 DIAGNOSIS — I5022 Chronic systolic (congestive) heart failure: Secondary | ICD-10-CM

## 2021-12-17 DIAGNOSIS — I471 Supraventricular tachycardia: Secondary | ICD-10-CM

## 2021-12-17 DIAGNOSIS — Z95810 Presence of automatic (implantable) cardiac defibrillator: Secondary | ICD-10-CM

## 2021-12-17 DIAGNOSIS — I420 Dilated cardiomyopathy: Secondary | ICD-10-CM

## 2021-12-17 NOTE — Progress Notes (Incomplete)
Patient Care Team: Patient, No Pcp Per (Inactive) as PCP - General (General Practice)   HPI  Sydney Shaffer is a 56 y.o. female in follow-up for an ICD Medtronic  implant initially 2013 for primary prevention in the setting of nonischemic cardiomyopathy.  GEN change 1220  She has had SVT and underwent ablation of the septal atrial tachycardia as well as slow pathway.  Recurrent atrial arrhythmias 2017 and ventricular tachycardia treated with amiodarone.  Has been maintained on amiodarone.  Occasional palpitations   Patient denies symptoms of GI intolerance, sun sensitivity, neurological symptoms attributable to amiodarone.    DATE TEST EF    7/13 Echo  20-25 %    2/15 Echo 20-25 %    10/17 Echo  30-35    10/17 Cath   Normal CA  6/19 Echo  30-35%            Date Cr K Hgb TSH LFTs PFTs  9/17        2.555 22    1/18 0.83 4.5          9/19 0.78 4.6 12.8(4/19) 2.9 15    12/20 1.97 3.8 13.6 1.46(9/20) 21        Records and Results Reviewed   Past Medical History:  Diagnosis Date   Anemia    felt to be due to heavy menstrual flow   Atrial tachycardia (HCC)    s/p ablation   AVNRT (AV nodal re-entry tachycardia) ([place])    s/p ablation   CHF (congestive heart failure) ([place])    Dx 03/2012 - dilated cardiomyopathy with EF 20-25% by echo (abnl nuc but normal coronaries 04/02/12 per cath.    Chronic systolic heart failure (HCC)    Dysrhythmia    Bradycardia   Hypertension    Hypertensive heart disease with CHF ([place])    ICD (implantable cardiac defibrillator) in place 08/13/2012    Past Surgical History:  Procedure Laterality Date   APPENDECTOMY     CARDIAC CATHETERIZATION  April 2013   normal coronaries   CARDIAC CATHETERIZATION [name] 09/12/2016   Procedure: Right/Left Heart Cath and Coronary Angiography;  Surgeon: Jolaine Artist, MD;  Location: [facility];  Service: Cardiovascular;  Laterality: [name];   DILITATION & CURRETTAGE/HYSTROSCOPY WITH VERSAPOINT  RESECTION [name] 10/05/2015   Procedure: DILATATION & CURETTAGE/HYSTEROSCOPY WITH VERSAPOINT RESECTION;  Surgeon: [name], MD;  Location: [facility];  Service: Gynecology;  Laterality: [name];   EP study and ablation  01/07/13   Ablation of AVNRT and atrial tachycardia (arising from the anteroseptal RA 14mm above the HIS)   ICD  08/13/2012   ICD GENERATOR CHANGEOUT [name] 11/14/2019   Procedure: ICD [place];  Surgeon: Deboraha Sprang, MD;  Location: [facility];  Service: Cardiovascular;  Laterality: [name];   IMPLANTABLE CARDIOVERTER DEFIBRILLATOR IMPLANT [name] 08/13/2012   Procedure: IMPLANTABLE CARDIOVERTER DEFIBRILLATOR IMPLANT;  Surgeon: Deboraha Sprang, MD;  Location: [facility] CATH LAB;  Service: Cardiovascular;  Laterality: [name];   LEFT HEART CATHETERIZATION WITH CORONARY ANGIOGRAM [name] 04/02/2012   Procedure: LEFT HEART CATHETERIZATION WITH CORONARY ANGIOGRAM;  Surgeon: Peter M Martinique, MD;  Location: [facility] C[facility] LAB;  Service: Cardiovascular;  Laterality: [name];   SUPRAVENTRICULAR TACHYCARDIA ABLATION [name] 01/07/2013   Procedure: SUPRAVENTRICULAR TACHYCARDIA ABLATION;  Surgeon: Thompson Grayer, MD;  Location: [facility] CATH LAB;  Service: Cardiovascular;  Laterality: [name];   TUBAL LIGATION      No outpatient medications have been marked as taking for the 12/17/21  encounter (Appointment) with Deboraha Sprang, MD.    No Known Allergies    Review of Systems negative except from HPI and PMH  Physical Exam There were no vitals taken for this visit. Well developed and well nourished in no acute distress HENT normal E scleral and icterus clear Neck Supple JVP flat; carotids brisk and full Clear to ausculation  Regular rate and rhythm, no murmurs gallops or rub Soft with active bowel sounds No clubbing cyanosis  Edema Alert and oriented, grossly normal motor and sensory function Skin Warm and Dry  ECG  Sinus @ 80 14/06/38  CrCl cannot be calculated (Patient's most recent lab result is older than the  maximum 21 days allowed.).   Assessment and  Plan  Nonischemic cardiomyopathy   Atrial Tachycardia/Flutter (CL310)   ICD Medtronic The patient's device was interrogated.  The information was reviewed. No changes were made in the programming.       CHF chronic systolic  High Risk Medication Surveillance amiodarone   occ flutters  Euvolemic continue current meds  No intercurrent Ventricular tachycardia  Will decrease amio to 5 d week   Check amio surveillance labs     Current medicines are reviewed at length with the patient today .  The patient does not  have concerns regarding medicines.

## 2022-01-27 ENCOUNTER — Encounter: Payer: BLUE CROSS/BLUE SHIELD | Admitting: Internal Medicine

## 2022-02-24 ENCOUNTER — Other Ambulatory Visit (HOSPITAL_COMMUNITY): Payer: Self-pay | Admitting: *Deleted

## 2022-02-24 DIAGNOSIS — I5022 Chronic systolic (congestive) heart failure: Secondary | ICD-10-CM

## 2022-02-24 MED ORDER — FUROSEMIDE 40 MG PO TABS: 60.0000 mg | ORAL_TABLET | Freq: Two times a day (BID) | ORAL | 0 refills | Status: DC

## 2022-03-07 ENCOUNTER — Telehealth (HOSPITAL_COMMUNITY): Payer: Self-pay

## 2022-03-07 NOTE — Progress Notes (Addendum)
? ?Advanced Heart Failure Clinic Note  ? ?PCP: Linward Natal, PA-C ([facility]) ?EP: Dr Caryl Comes Medtronic ICD ?HF Cardiologist: Dr. Haroldine Laws ? ?HPI: ?Sydney Shaffer is a 56 y.o.female with a history of chronic systolic HF due to nonischemic cardiomyopathy s/p Medtronic ICD, HTN, and SVT s/p 01/08/14 ablation.  Cath 4/13 normal coronaries. ? ?Lost to follow up until 5/19. Echo 6/19 EF 30-35% mild MR. Saw Dr. Caryl Comes in 9/20 and had some recurrent brief. SVT. No VT. S/p ICD gen change 12/20. ? ?Follow up 12/20 volume looked good, stable NYHA II symptoms. [name] started and echo arranged. ? ?Today she returns for HF follow up. We have not seen her since 11/2019. Overall feeling fine. She has occasional SOB, but does OK walking on flat ground and with ADLs. Occasional dizziness, and feeling more palpitations with activity. Notices occasional ankle swelling. Denies CP, abnormal bleeding or PND/Orthopnea. Appetite ok. No fever or chills. Weight at home 180 pounds. Been off Entresto x 3-4 months. Does not work, no Insurance underwriter. ? ?Cardiac Studies: ?ECHO 07/05/12 EF 20-25%  ?ECHO 02/04/14 EF 20-25%  ?ECHO 6/16 EF 30-35% mild MR ?ECHO 09/2016 EF 25-30%, Grade 1 DD ? ?- R/LHC (10/17): normal cors ? ?RA = 3 ?RV = 23/4 ?PA = 22/6 (14) ?PCW = 8 ?Ao = 123/69 (91) ?LV = 106/6 ?Fick cardiac output/index = 7.4/3.5 ?PVR = < 1.0 WU ?SVR = 958  ?FA sat = 88% ?PA sat = 63%, 62% ?  ?- CPX (5/18) ? ?FVC 1.99 (61%)      ?FEV1 1.59 (61%)        ?FEV1/FVC 80 (99%)        ?MVV 58  (57%)      ? ?Resting HR: 81 Peak HR: 121   (72% age predicted max HR) ?BP rest: 108/68 BP peak: 144/80 ?Peak VO2: 12.3 (61% predicted peak VO2) ?VE/VCO2 slope:  25 ?OUES: 2.13 ?Peak RER: 0.98 ?Ventilatory Threshold: 11.2 (55% predicted or measured peak VO2) ?VE/MVV:  59% ?PETCO2 at peak:  40 ?O2pulse:  10   (91% predicted O2pulse) ? ?- CPX (6/15): ?FVC 1.69 (51%)  ?FEV1 1.24 (47%)  ?FEV1/FVC 74%  ?Peak VO2: 12.6 (57.8% predicted peak VO2) ?VE/VCO2 slope: 28.6 ?OUES:  1.60 ?Peak RER: 1.01 ?Peak VO2 adjusted to the patient's ideal body weight of 150 lb (67.8 kg) the peak ?VO2 is 16.5 ml/kg (ibw)/min (62% of the ibw-adjusted predicted). ? ? ?SH: She is not a smoker. She does not drink alcohol. Lives alone. She has 3 grown children  ? ?FH: Mom breast cancer. Father cancer.  ? ?ROS: All systems negative except as listed in HPI, PMH and Problem List. ? ?Past Medical History:  ?Diagnosis Date  ? Anemia   ? felt to be due to heavy menstrual flow  ? Atrial tachycardia ([place])   ? s/p ablation  ? AVNRT (AV nodal re-entry tachycardia) ([place])   ? s/p ablation  ? CHF (congestive heart failure) ([place])   ? Dx 03/2012 - dilated cardiomyopathy with EF 20-25% by echo (abnl nuc but normal coronaries 04/02/12 per cath.   ? Chronic systolic heart failure ([place])   ? Dysrhythmia   ? Bradycardia  ? Hypertension   ? Hypertensive heart disease with CHF ([place])   ? ICD (implantable cardiac defibrillator) in place 08/13/2012  ? ?Current Outpatient Medications  ?Medication Sig Dispense Refill  ? amiodarone (PACERONE) 200 MG tablet Take 1 tablet (200 mg total) by mouth daily. Monday through Friday. Please keep upcoming appt with Dr.  Caryl Comes in January 2023 before anymore refills. Thank you 30 tablet 1  ? carvedilol (COREG) 12.5 MG tablet Take 1 tablet (12.5 mg total) by mouth 2 (two) times daily with a meal. Please keep upcoming appt in January 2023 with Dr. Caryl Comes before anymore refills. Thank you 60 tablet 1  ? dapagliflozin propanediol (FARXIGA) 10 MG TABS tablet Take 1 tablet (10 mg total) by mouth daily. Needs appt for future refills 30 tablet 2  ? Doxylamine Succinate, Sleep, (SLEEP AID PO) Take 1 tablet by mouth at bedtime as needed (sleep).     ? ferrous sulfate 325 (65 FE) MG tablet Take 325 mg by mouth daily with breakfast.    ? furosemide (LASIX) 40 MG tablet Take 1.5 tablets (60 mg total) by mouth 2 (two) times daily. 90 tablet 0  ? Multiple Vitamin (MULTIVITAMIN WITH MINERALS) TABS Take 1 tablet by mouth  daily.    ? potassium chloride SA (KLOR-CON M20) 20 MEQ tablet TAKE 1 TABLET (20 MEQ TOTAL) BY MOUTH 2 (TWO) TIMES DAILY. 60 tablet 1  ? sacubitril-valsartan (ENTRESTO) 97-103 MG Take 1 tablet by mouth 2 (two) times daily. 60 tablet 3  ? spironolactone (ALDACTONE) 25 MG tablet TAKE 1 TABLET(25 MG) BY MOUTH DAILY 30 tablet 1  ? ?No current facility-administered medications for this encounter.  ? ?BP 102/74   Pulse 78   Wt 85.2 kg (187 lb 12.8 oz)   SpO2 96%   BMI 29.41 kg/m?  ? ?Wt Readings from Last 3 Encounters:  ?03/10/22 85.2 kg (187 lb 12.8 oz)  ?03/20/20 93.4 kg (206 lb)  ?11/14/19 89.8 kg (198 lb)  ?  ?PHYSICAL EXAM: ?General:  NAD. No resp difficulty ?HEENT: Normal ?Neck: Supple. No JVD. Carotids 2+ bilat; no bruits. No lymphadenopathy or thryomegaly appreciated. ?Cor: PMI nondisplaced. Regular rate & rhythm. No rubs, gallops or murmurs. ?Lungs: Clear ?Abdomen: Soft, nontender, nondistended. No hepatosplenomegaly. No bruits or masses. Good bowel sounds. ?Extremities: No cyanosis, clubbing, rash, edema ?Neuro: Alert & oriented x 3, cranial nerves grossly intact. Moves all 4 extremities w/o difficulty. Affect pleasant. ? ?ECG (personally reviewed): NSR + LVH 79 bpm ? ?ICD interrogation (personally reviewed): OptiVol has been up but now trending back down, thoracic impedence at baseline, 1.5 hrs daily activity, ? Short bursts of AF/AT (longest run 8 minutes), no VF. ? ?ASSESSMENT & PLAN: ?1. Chronic Systolic Heart Failure:  ?- Normal cors by cath 2013. Nonischemic cardiomyopathy s/p Medtronic ICD.  ?- Echo (6/16): EF 30-35%. ?- Echo (10/17): EF 25-30%.   ?- Echo (6/19): EF 30-35%. ?- ICD generator change 11/14/19 ?- Stable NYHA II-early III (fatigued). Volume status looks good today. ?- Continue Lasix 60 mg bid. ?- No BP room for ARNi/ARB. ?- Continue spironolactone 25 mg daily. ?- Continue Coreg 12.5 mg bid.      ?- Continue Farxiga 10 mg daily.        ?- Repeat echo.          ?- Labs today. ?                                                                                            -  ?  2. SVT and VT:  ?- s/p atrial tachycardia ablation in 01/2014.   ?- Followed by Dr. Caryl Comes, has f/u next month. ?- Continue amiodarone 200 mg 5 days/week (M-F).  ?- Check amio labs. ? ?3. SDOH ?- uninsured. ?- Engage HFSW  ? ?Follow up in 6 months with Dr. Haroldine Laws + echo. ? ?Rafael Bihari, FNP-BC ?03/10/2022 ? ?

## 2022-03-07 NOTE — Telephone Encounter (Signed)
Called to confirm/remind patient of their appointment at the [facility] on 03/10/22.  ? ?Patient reminded to bring all medications and/or complete list. ? ?Confirmed patient has transportation. Gave directions, instructed to utilize [place] parking. ? ?Confirmed appointment prior to ending call.  ? ?

## 2022-03-10 ENCOUNTER — Encounter (HOSPITAL_COMMUNITY): Payer: Self-pay

## 2022-03-10 ENCOUNTER — Other Ambulatory Visit (HOSPITAL_COMMUNITY): Payer: Self-pay

## 2022-03-10 ENCOUNTER — Ambulatory Visit (HOSPITAL_COMMUNITY)
Admission: RE | Admit: 2022-03-10 | Discharge: 2022-03-10 | Disposition: A | Discharge status: Home / Self Care | Payer: Self-pay | Source: Ambulatory Visit | Attending: Family Medicine | Admitting: Family Medicine

## 2022-03-10 VITALS — BP 102/74 | HR 78 | Wt 187.8 lb

## 2022-03-10 DIAGNOSIS — Z7984 Long term (current) use of oral hypoglycemic drugs: Secondary | ICD-10-CM | POA: Insufficient documentation

## 2022-03-10 DIAGNOSIS — R42 Dizziness and giddiness: Secondary | ICD-10-CM | POA: Insufficient documentation

## 2022-03-10 DIAGNOSIS — Z7901 Long term (current) use of anticoagulants: Secondary | ICD-10-CM | POA: Insufficient documentation

## 2022-03-10 DIAGNOSIS — I471 Supraventricular tachycardia, unspecified: Secondary | ICD-10-CM

## 2022-03-10 DIAGNOSIS — Z95810 Presence of automatic (implantable) cardiac defibrillator: Secondary | ICD-10-CM

## 2022-03-10 DIAGNOSIS — Z79899 Other long term (current) drug therapy: Secondary | ICD-10-CM | POA: Insufficient documentation

## 2022-03-10 DIAGNOSIS — I5022 Chronic systolic (congestive) heart failure: Secondary | ICD-10-CM

## 2022-03-10 DIAGNOSIS — I110 Hypertensive heart disease with heart failure: Secondary | ICD-10-CM | POA: Insufficient documentation

## 2022-03-10 DIAGNOSIS — I420 Dilated cardiomyopathy: Secondary | ICD-10-CM | POA: Insufficient documentation

## 2022-03-10 DIAGNOSIS — R0602 Shortness of breath: Secondary | ICD-10-CM | POA: Insufficient documentation

## 2022-03-10 DIAGNOSIS — R002 Palpitations: Secondary | ICD-10-CM | POA: Insufficient documentation

## 2022-03-10 DIAGNOSIS — I472 Ventricular tachycardia, unspecified: Secondary | ICD-10-CM

## 2022-03-10 DIAGNOSIS — Z139 Encounter for screening, unspecified: Secondary | ICD-10-CM

## 2022-03-10 LAB — COMPREHENSIVE METABOLIC PANEL
ALT: 29 U/L (ref 0–44)
AST: 22 U/L (ref 15–41)
Albumin: 3.2 g/dL — ABNORMAL LOW (ref 3.5–5.0)
Alkaline Phosphatase: 76 U/L (ref 38–126)
Anion gap: 6 (ref 5–15)
BUN: 20 mg/dL (ref 6–20)
CO2: 26 mmol/L (ref 22–32)
Calcium: 9 mg/dL (ref 8.9–10.3)
Chloride: 107 mmol/L (ref 98–111)
Creatinine, Ser: 1.15 mg/dL — ABNORMAL HIGH (ref 0.44–1.00)
GFR, Estimated: 56 mL/min — ABNORMAL LOW (ref >60)
Glucose, Bld: 106 mg/dL — ABNORMAL HIGH (ref 70–99)
Potassium: 3.6 mmol/L (ref 3.5–5.1)
Sodium: 139 mmol/L (ref 135–145)
Total Bilirubin: 0.7 mg/dL (ref 0.3–1.2)
Total Protein: 6.4 g/dL — ABNORMAL LOW (ref 6.5–8.1)

## 2022-03-10 LAB — CBC
HCT: 39.7 % (ref 36.0–46.0)
Hemoglobin: 13.1 g/dL (ref 12.0–15.0)
MCH: 28.6 pg (ref 26.0–34.0)
MCHC: 33 g/dL (ref 30.0–36.0)
MCV: 86.7 fL (ref 80.0–100.0)
Platelets: 270 10*3/uL (ref 150–400)
RBC: 4.58 MIL/uL (ref 3.87–5.11)
RDW: 14.1 % (ref 11.5–15.5)
WBC: 6.1 10*3/uL (ref 4.0–10.5)
nRBC: 0 % (ref 0.0–0.2)

## 2022-03-10 LAB — TSH: TSH: 1.8 u[IU]/mL (ref 0.350–4.500)

## 2022-03-10 LAB — BRAIN NATRIURETIC PEPTIDE: B Natriuretic Peptide: 401.2 pg/mL — ABNORMAL HIGH (ref 0.0–100.0)

## 2022-03-10 NOTE — Progress Notes (Addendum)
?  Heart and Vascular Care Navigation ? ?03/10/2022 ? ?Sydney Shaffer ?01-25-1966 ?[id number] ? ?Reason for Referral: lack of insurance ?  ?Engaged with patient face to face for initial visit for Heart and Vascular Care Coordination. ?                                                                                                  ?Assessment:   CSW met with pt to discuss current lack of insurance.  Per patient she had signed up for insurance last year and submitted initial payment even receiving a card at the beginning of the year but then later received notice that her payment wasn't received and she didn't have coverage.   States she tried call insurance to discuss but didn't get anywhere and has been trying to find coverage since but is being told she is not in open enrollment period. ? ?CSW encouraged her to call the [place] Navigator to discuss if she qualifies for a special enrollment period due to a qualifying life event. ? ?CSW also supplied with application for [facility] Financial Assistance to help with [facility] bills.                                ? ?HRT/VAS Care Coordination   ? ? Home Assistive Devices/Equipment --  has heart monitor  ? ?  ? ? ?Social History:                                                                             ?SDOH Screenings  ? ?Alcohol Screen: Not on file  ?Depression (PHQ2-9): Not on file  ?Financial Resource Strain: Not on file  ?Food Insecurity: Not on file  ?Housing: Not on file  ?Physical Activity: Not on file  ?Social Connections: Not on file  ?Stress: Not on file  ?Tobacco Use: Low Risk   ? Smoking Tobacco Use: Never  ? Smokeless Tobacco Use: Never  ? Passive Exposure: Not on file  ?Transportation Needs: Not on file  ? ? ?Follow-up plan:   ? ?Pt to follow up regarding [employer] insurance and work on CAFA to help with [facility] bills. ? ?Will continue to follow and assist as needed ? ?[name], LCSW ?Clinical Social Worker ?Advanced Heart Failure Clinic ?Desk#: [phone] ?Cell#:  [phone] ? ? ? ?

## 2022-03-10 NOTE — Patient Instructions (Signed)
There has been no changes to your medications. ? ?Labs done today, your results will be available in MyChart, we will contact you for abnormal readings. ? ?Your physician has requested that you have an echocardiogram. Echocardiography is a painless test that uses sound waves to create images of your heart. It provides your doctor with information about the size and shape of your heart and how well your heart?s chambers and valves are working. This procedure takes approximately one hour. There are no restrictions for this procedure. ? ?Your physician recommends that you schedule a follow-up appointment in: 4-6 weeks. ? ?Your physician recommends that you schedule a follow-up appointment in: Dr. Haroldine Laws in 6 months (September 2023)  ** please call the office in July to arrange your follow up appointment ** ? ? ?If you have any questions or concerns before your next appointment please send [place] a message through [place] or call our office at [phone].   ? ?TO LEAVE A MESSAGE FOR THE NURSE SELECT OPTION 2, PLEASE LEAVE A MESSAGE INCLUDING: ?YOUR NAME ?DATE OF BIRTH ?CALL BACK NUMBER ?REASON FOR CALL**this is important as we prioritize the call backs ? ?YOU WILL RECEIVE A CALL BACK THE SAME DAY AS LONG AS YOU CALL BEFORE 4:00 PM ? ?At the [facility], you and your health needs are our priority. As part of our continuing mission to provide you with exceptional heart care, we have created designated Provider Care Teams. These Care Teams include your primary Cardiologist (physician) and Advanced Practice Providers (APPs- Physician Assistants and Nurse Practitioners) who all work together to provide you with the care you need, when you need it.  ? ?You may see any of the following providers on your designated Care Team at your next follow up: ?Dr Glori Bickers ?Dr [name] ?Darrick Grinder, NP ?Lyda Jester, PA ?Jessica Milford,NP ?Marlyce Huge, PA ?Audry Riles, PharmD ? ? ?Please be sure to  bring in all your medications bottles to every appointment.  ? ? ?

## 2022-03-11 ENCOUNTER — Ambulatory Visit (INDEPENDENT_AMBULATORY_CARE_PROVIDER_SITE_OTHER): Payer: Self-pay

## 2022-03-11 DIAGNOSIS — I420 Dilated cardiomyopathy: Secondary | ICD-10-CM

## 2022-03-12 ENCOUNTER — Telehealth (HOSPITAL_COMMUNITY): Payer: Self-pay | Admitting: Cardiology

## 2022-03-12 DIAGNOSIS — I5022 Chronic systolic (congestive) heart failure: Secondary | ICD-10-CM

## 2022-03-12 LAB — CUP PACEART REMOTE DEVICE CHECK
Battery Remaining Longevity: 112 mo
Battery Voltage: 3.01 V
Brady Statistic AP VP Percent: 0 %
Brady Statistic AP VS Percent: 0.08 %
Brady Statistic AS VP Percent: 0.04 %
Brady Statistic AS VS Percent: 99.88 %
Brady Statistic RA Percent Paced: 0.08 %
Brady Statistic RV Percent Paced: 0.04 %
Date Time Interrogation Session: 20230403192952
HighPow Impedance: 62 Ohm
Implantable Lead Implant Date: 20130906
Implantable Lead Implant Date: 20130906
Implantable Lead Location: 753859
Implantable Lead Location: 753860
Implantable Lead Model: 181
Implantable Lead Model: 5076
Implantable Lead Serial Number: 322962
Implantable Pulse Generator Implant Date: 20201207
Lead Channel Impedance Value: 285 Ohm
Lead Channel Impedance Value: 304 Ohm
Lead Channel Impedance Value: 399 Ohm
Lead Channel Pacing Threshold Amplitude: 0.5 V
Lead Channel Pacing Threshold Amplitude: 1 V
Lead Channel Pacing Threshold Pulse Width: 0.4 ms
Lead Channel Pacing Threshold Pulse Width: 0.4 ms
Lead Channel Sensing Intrinsic Amplitude: 12 mV
Lead Channel Sensing Intrinsic Amplitude: 12 mV
Lead Channel Sensing Intrinsic Amplitude: 2.625 mV
Lead Channel Sensing Intrinsic Amplitude: 2.625 mV
Lead Channel Setting Pacing Amplitude: 1.5 V
Lead Channel Setting Pacing Amplitude: 2.5 V
Lead Channel Setting Pacing Pulse Width: 0.4 ms
Lead Channel Setting Sensing Sensitivity: 0.3 mV

## 2022-03-12 MED ORDER — FUROSEMIDE 40 MG PO TABS: 40.0000 mg | ORAL_TABLET | Freq: Two times a day (BID) | ORAL | 3 refills | Status: DC

## 2022-03-12 MED ORDER — POTASSIUM CHLORIDE CRYS ER 20 MEQ PO TBCR: 40.0000 meq | EXTENDED_RELEASE_TABLET | Freq: Two times a day (BID) | ORAL | 1 refills | Status: DC

## 2022-03-12 NOTE — Telephone Encounter (Signed)
Patient called.  Patient aware.  

## 2022-03-12 NOTE — Telephone Encounter (Signed)
-----   Message from Rafael Bihari, [place] sent at 03/10/2022  4:24 PM EDT ----- ?BNP is elevated, suggesting fluid is up. Please increase Lasix to 80 mg bid and increase KCL to 40 mEq bid ? ?Repeat BMET and BNP in 7-10 days. ?

## 2022-03-19 ENCOUNTER — Inpatient Hospital Stay (HOSPITAL_COMMUNITY)
Admission: RE | Admit: 2022-03-19 | Discharge: 2022-03-19 | Disposition: A | Discharge status: Home / Self Care | Payer: Self-pay | Source: Ambulatory Visit

## 2022-03-19 DIAGNOSIS — I5022 Chronic systolic (congestive) heart failure: Secondary | ICD-10-CM

## 2022-03-27 NOTE — Progress Notes (Signed)
Remote ICD transmission.   

## 2022-03-28 ENCOUNTER — Ambulatory Visit (HOSPITAL_COMMUNITY)
Admission: RE | Admit: 2022-03-28 | Discharge: 2022-03-28 | Disposition: A | Discharge status: Home / Self Care | Payer: Self-pay | Source: Ambulatory Visit | Attending: Cardiology | Admitting: Cardiology

## 2022-03-28 DIAGNOSIS — I5022 Chronic systolic (congestive) heart failure: Secondary | ICD-10-CM

## 2022-03-28 DIAGNOSIS — I081 Rheumatic disorders of both mitral and tricuspid valves: Secondary | ICD-10-CM | POA: Insufficient documentation

## 2022-03-28 DIAGNOSIS — I110 Hypertensive heart disease with heart failure: Secondary | ICD-10-CM | POA: Insufficient documentation

## 2022-03-28 DIAGNOSIS — Z95810 Presence of automatic (implantable) cardiac defibrillator: Secondary | ICD-10-CM | POA: Insufficient documentation

## 2022-03-28 DIAGNOSIS — I509 Heart failure, unspecified: Secondary | ICD-10-CM | POA: Insufficient documentation

## 2022-03-28 LAB — ECHOCARDIOGRAM COMPLETE
Area-P 1/2: 6.94 cm2
MV M vel: 3.97 m/s
MV Peak grad: 63 mmHg
Radius: 0.5 cm
S' Lateral: 5.5 cm

## 2022-03-28 LAB — BASIC METABOLIC PANEL
Anion gap: 7 (ref 5–15)
BUN: 20 mg/dL (ref 6–20)
CO2: 24 mmol/L (ref 22–32)
Calcium: 9.5 mg/dL (ref 8.9–10.3)
Chloride: 110 mmol/L (ref 98–111)
Creatinine, Ser: 0.92 mg/dL (ref 0.44–1.00)
GFR, Estimated: >60 mL/min (ref >60)
Glucose, Bld: 100 mg/dL — ABNORMAL HIGH (ref 70–99)
Potassium: 4.1 mmol/L (ref 3.5–5.1)
Sodium: 141 mmol/L (ref 135–145)

## 2022-03-28 LAB — BRAIN NATRIURETIC PEPTIDE: B Natriuretic Peptide: 480.3 pg/mL — ABNORMAL HIGH (ref 0.0–100.0)

## 2022-03-28 NOTE — Progress Notes (Signed)
*  Echocardiogram ?2D Echocardiogram has been performed. ? Hassie Bruce ?03/28/2022, 3:46 PM ?

## 2022-03-29 ENCOUNTER — Other Ambulatory Visit (HOSPITAL_COMMUNITY): Payer: Self-pay | Admitting: Internal Medicine

## 2022-03-29 DIAGNOSIS — I5022 Chronic systolic (congestive) heart failure: Secondary | ICD-10-CM

## 2022-04-21 ENCOUNTER — Encounter (HOSPITAL_COMMUNITY): Payer: Self-pay

## 2022-04-21 ENCOUNTER — Telehealth (HOSPITAL_COMMUNITY): Payer: Self-pay

## 2022-04-21 NOTE — Telephone Encounter (Signed)
Called and was unable to leave patient a voice message to confirm/remind patient of their appointment at the [facility] on 04/21/22.  ? ?

## 2022-04-21 NOTE — Progress Notes (Incomplete)
? ?Advanced Heart Failure Clinic Note  ? ?PCP: Linward Natal, PA-C ([facility]) ?EP: Dr Caryl Comes Medtronic ICD ?HF Cardiologist: Dr. Haroldine Laws ? ?HPI: ?Sydney Shaffer is a 56 y.o.female with a history of chronic systolic HF due to nonischemic cardiomyopathy s/p Medtronic ICD, HTN, and SVT s/p 01/08/14 ablation.  Cath 4/13 normal coronaries. ? ?Lost to follow up until 5/19. Echo 6/19 EF 30-35% mild MR. Saw Dr. Caryl Comes in 9/20 and had some recurrent brief. SVT. No VT. S/p ICD gen change 12/20. ? ?Follow up 12/20 volume looked good, stable NYHA II symptoms. [name] started and echo arranged. ? ?Today she returns for HF follow up. We have not seen her since 11/2019. Overall feeling fine. She has occasional SOB, but does OK walking on flat ground and with ADLs. Occasional dizziness, and feeling more palpitations with activity. Notices occasional ankle swelling. Denies CP, abnormal bleeding or PND/Orthopnea. Appetite ok. No fever or chills. Weight at home 180 pounds. Been off Entresto x 3-4 months. Does not work, no Insurance underwriter. ? ?Cardiac Studies: ?ECHO 07/05/12 EF 20-25%  ?ECHO 02/04/14 EF 20-25%  ?ECHO 6/16 EF 30-35% mild MR ?ECHO 09/2016 EF 25-30%, Grade 1 DD ? ?- R/LHC (10/17): normal cors ? ?RA = 3 ?RV = 23/4 ?PA = 22/6 (14) ?PCW = 8 ?Ao = 123/69 (91) ?LV = 106/6 ?Fick cardiac output/index = 7.4/3.5 ?PVR = < 1.0 WU ?SVR = 958  ?FA sat = 88% ?PA sat = 63%, 62% ?  ?- CPX (5/18) ? ?FVC 1.99 (61%)      ?FEV1 1.59 (61%)        ?FEV1/FVC 80 (99%)        ?MVV 58  (57%)      ? ?Resting HR: 81 Peak HR: 121   (72% age predicted max HR) ?BP rest: 108/68 BP peak: 144/80 ?Peak VO2: 12.3 (61% predicted peak VO2) ?VE/VCO2 slope:  25 ?OUES: 2.13 ?Peak RER: 0.98 ?Ventilatory Threshold: 11.2 (55% predicted or measured peak VO2) ?VE/MVV:  59% ?PETCO2 at peak:  40 ?O2pulse:  10   (91% predicted O2pulse) ? ?- CPX (6/15): ?FVC 1.69 (51%)  ?FEV1 1.24 (47%)  ?FEV1/FVC 74%  ?Peak VO2: 12.6 (57.8% predicted peak VO2) ?VE/VCO2 slope: 28.6 ?OUES:  1.60 ?Peak RER: 1.01 ?Peak VO2 adjusted to the patient's ideal body weight of 150 lb (67.8 kg) the peak ?VO2 is 16.5 ml/kg (ibw)/min (62% of the ibw-adjusted predicted). ? ? ?SH: She is not a smoker. She does not drink alcohol. Lives alone. She has 3 grown children  ? ?FH: Mom breast cancer. Father cancer.  ? ?ROS: All systems negative except as listed in HPI, PMH and Problem List. ? ?Past Medical History:  ?Diagnosis Date  ? Anemia   ? felt to be due to heavy menstrual flow  ? Atrial tachycardia ([place])   ? s/p ablation  ? AVNRT (AV nodal re-entry tachycardia) ([place])   ? s/p ablation  ? CHF (congestive heart failure) ([place])   ? Dx 03/2012 - dilated cardiomyopathy with EF 20-25% by echo (abnl nuc but normal coronaries 04/02/12 per cath.   ? Chronic systolic heart failure ([place])   ? Dysrhythmia   ? Bradycardia  ? Hypertension   ? Hypertensive heart disease with CHF ([place])   ? ICD (implantable cardiac defibrillator) in place 08/13/2012  ? ?Current Outpatient Medications  ?Medication Sig Dispense Refill  ? amiodarone (PACERONE) 200 MG tablet Take 1 tablet (200 mg total) by mouth daily. Monday through Friday. Please keep upcoming appt with Dr.  Caryl Comes in January 2023 before anymore refills. Thank you 30 tablet 1  ? carvedilol (COREG) 12.5 MG tablet Take 1 tablet (12.5 mg total) by mouth 2 (two) times daily with a meal. Please keep upcoming appt in January 2023 with Dr. Caryl Comes before anymore refills. Thank you 60 tablet 1  ? dapagliflozin propanediol (FARXIGA) 10 MG TABS tablet Take 1 tablet (10 mg total) by mouth daily. Needs appt for future refills 30 tablet 2  ? Doxylamine Succinate, Sleep, (SLEEP AID PO) Take 1 tablet by mouth at bedtime as needed (sleep).     ? ferrous sulfate 325 (65 FE) MG tablet Take 325 mg by mouth daily with breakfast.    ? furosemide (LASIX) 40 MG tablet Take 1.5 tablets (60 mg total) by mouth daily. please call [phone] to schedule follow up 45 tablet 3  ? Multiple Vitamin (MULTIVITAMIN WITH  MINERALS) TABS Take 1 tablet by mouth daily.    ? potassium chloride SA (KLOR-CON M20) 20 MEQ tablet Take 2 tablets (40 mEq total) by mouth 2 (two) times daily. 120 tablet 1  ? spironolactone (ALDACTONE) 25 MG tablet TAKE 1 TABLET(25 MG) BY MOUTH DAILY 30 tablet 1  ? ?No current facility-administered medications for this visit.  ? ?There were no vitals taken for this visit. ? ?Wt Readings from Last 3 Encounters:  ?03/10/22 85.2 kg (187 lb 12.8 oz)  ?03/20/20 93.4 kg (206 lb)  ?11/14/19 89.8 kg (198 lb)  ?  ?PHYSICAL EXAM: ?General:  NAD. No resp difficulty ?HEENT: Normal ?Neck: Supple. No JVD. Carotids 2+ bilat; no bruits. No lymphadenopathy or thryomegaly appreciated. ?Cor: PMI nondisplaced. Regular rate & rhythm. No rubs, gallops or murmurs. ?Lungs: Clear ?Abdomen: Soft, nontender, nondistended. No hepatosplenomegaly. No bruits or masses. Good bowel sounds. ?Extremities: No cyanosis, clubbing, rash, edema ?Neuro: Alert & oriented x 3, cranial nerves grossly intact. Moves all 4 extremities w/o difficulty. Affect pleasant. ? ?ECG (personally reviewed): NSR + LVH 79 bpm ? ?ICD interrogation (personally reviewed): OptiVol has been up but now trending back down, thoracic impedence at baseline, 1.5 hrs daily activity, ? Short bursts of AF/AT (longest run 8 minutes), no VF. ? ?ASSESSMENT & PLAN: ?1. Chronic Systolic Heart Failure:  ?- Normal cors by cath 2013. Nonischemic cardiomyopathy s/p Medtronic ICD.  ?- Echo (6/16): EF 30-35%. ?- Echo (10/17): EF 25-30%.   ?- Echo (6/19): EF 30-35%. ?- ICD generator change 11/14/19 ?- Stable NYHA II-early III (fatigued). Volume status looks good today. ?- Continue Lasix 60 mg bid. ?- No BP room for ARNi/ARB. ?- Continue spironolactone 25 mg daily. ?- Continue Coreg 12.5 mg bid.      ?- Continue Farxiga 10 mg daily.        ?- Repeat echo.          ?- Labs today. ?                                                                                           -  ?2. SVT and VT:  ?- s/p  atrial tachycardia ablation in 01/2014.   ?- Followed by Dr. Caryl Comes, has f/u next month. ?- Continue  amiodarone 200 mg 5 days/week (M-F).  ?- Check amio labs. ? ?3. SDOH ?- uninsured. ?- Engage HFSW  ? ?Follow up in 6 months with Dr. Haroldine Laws + echo. ? ?Rafael Bihari, FNP-BC ?04/21/2022 ? ?

## 2022-04-21 NOTE — Telephone Encounter (Signed)
error 

## 2022-04-24 ENCOUNTER — Encounter: Payer: Self-pay | Admitting: Internal Medicine

## 2022-04-30 ENCOUNTER — Encounter: Payer: Self-pay | Admitting: Internal Medicine

## 2022-04-30 DIAGNOSIS — I471 Supraventricular tachycardia: Secondary | ICD-10-CM

## 2022-04-30 DIAGNOSIS — I420 Dilated cardiomyopathy: Secondary | ICD-10-CM

## 2022-04-30 DIAGNOSIS — I5022 Chronic systolic (congestive) heart failure: Secondary | ICD-10-CM

## 2022-04-30 DIAGNOSIS — Z95810 Presence of automatic (implantable) cardiac defibrillator: Secondary | ICD-10-CM

## 2022-05-26 ENCOUNTER — Other Ambulatory Visit: Payer: Self-pay

## 2022-05-26 MED ORDER — AMIODARONE HCL 200 MG PO TABS: 200.0000 mg | ORAL_TABLET | Freq: Every day | ORAL | 0 refills | Status: DC

## 2022-06-10 ENCOUNTER — Encounter (HOSPITAL_COMMUNITY): Payer: Self-pay

## 2022-06-10 ENCOUNTER — Ambulatory Visit (HOSPITAL_COMMUNITY)
Admission: EM | Admit: 2022-06-10 | Discharge: 2022-06-10 | Disposition: A | Discharge status: Home / Self Care | Payer: Self-pay | Attending: Physician Assistant | Admitting: Physician Assistant

## 2022-06-10 ENCOUNTER — Ambulatory Visit (INDEPENDENT_AMBULATORY_CARE_PROVIDER_SITE_OTHER): Payer: Self-pay

## 2022-06-10 DIAGNOSIS — J069 Acute upper respiratory infection, unspecified: Secondary | ICD-10-CM | POA: Insufficient documentation

## 2022-06-10 DIAGNOSIS — Z20822 Contact with and (suspected) exposure to covid-19: Secondary | ICD-10-CM | POA: Insufficient documentation

## 2022-06-10 DIAGNOSIS — Z7951 Long term (current) use of inhaled steroids: Secondary | ICD-10-CM | POA: Insufficient documentation

## 2022-06-10 DIAGNOSIS — R0989 Other specified symptoms and signs involving the circulatory and respiratory systems: Secondary | ICD-10-CM

## 2022-06-10 DIAGNOSIS — R051 Acute cough: Secondary | ICD-10-CM | POA: Insufficient documentation

## 2022-06-10 DIAGNOSIS — J40 Bronchitis, not specified as acute or chronic: Secondary | ICD-10-CM | POA: Insufficient documentation

## 2022-06-10 DIAGNOSIS — R0602 Shortness of breath: Secondary | ICD-10-CM | POA: Insufficient documentation

## 2022-06-10 DIAGNOSIS — R059 Cough, unspecified: Secondary | ICD-10-CM

## 2022-06-10 MED ORDER — ALBUTEROL SULFATE HFA 108 (90 BASE) MCG/ACT IN AERS
2.0000 | INHALATION_SPRAY | Freq: Once | RESPIRATORY_TRACT | Status: AC
  Administered 2022-06-10: 2 via RESPIRATORY_TRACT

## 2022-06-10 MED ORDER — DOXYCYCLINE HYCLATE 100 MG PO CAPS: 100.0000 mg | ORAL_CAPSULE | Freq: Two times a day (BID) | ORAL | 0 refills | Status: DC

## 2022-06-10 MED ORDER — ALBUTEROL SULFATE HFA 108 (90 BASE) MCG/ACT IN AERS
INHALATION_SPRAY | RESPIRATORY_TRACT | Status: AC
  Filled 2022-06-10: qty 6.7

## 2022-06-10 MED ORDER — HYDROCODONE BIT-HOMATROP MBR 5-1.5 MG/5ML PO SOLN
5.0000 mL | Freq: Four times a day (QID) | ORAL | 0 refills | Status: DC | PRN
Start: 2022-06-10 — End: 2022-08-08

## 2022-06-10 NOTE — ED Triage Notes (Signed)
Pt states productive cough, congestion,runny nose,fever,body aches and fatigue for the past week.  Has been taking ibuprofen with some relief.

## 2022-06-10 NOTE — ED Provider Notes (Signed)
[facility]    CSN: [id number] Arrival date & time: 06/10/22  1029      History   Chief Complaint Chief Complaint  Patient presents with   Cough    HPI Sydney Shaffer is a 56 y.o. female.   56 year old female presents with cough, chest congestion, fever and fatigue.  Patient relates for the past 2 weeks she has been having progressive chest congestion, with purulent production.  Patient relates that she has been having frequent coughing exacerbations where she is coughing forcefully for a period of time.  Patient relates that she has intermittent wheezing and shortness of breath associated.  Patient indicates that her symptoms have gotten progressively worse over the past several days.  Patient has been having fatigue, fever which is intermittent, chills, sweats, and intermittent dizziness.  Patient indicates she has been taking DayQuil and Tylenol with minimal relief.  Patient also is having some upper respiratory symptoms with sinus congestion, postnasal drip, rhinitis, with production being purulent.  Patient relates that she is not having any nausea or vomiting, and is tolerating fluids well.  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  Patient relates that she has been vaccinated against COVID.   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    Patient Active Problem List   Diagnosis Date Noted   Dilated cardiomyopathy ([place]) 06/04/2015   Symptomatic anemia 06/04/2015   Chest pain 06/04/2015   Abnormal uterine bleeding (AUB) 06/04/2015   Metrorrhagia 06/04/2015   Abdominal pain    Hypokalemia    Menorrhagia    VT (ventricular tachycardia) ([place]) 07/22/2014   SVT (supraventricular tachycardia) ([place]) 03/30/2014   HTN (hypertension) 03/30/2014   Chest pressure- unspecified 02/03/2014   Atrial tachycardia ([place]) 10/01/2012   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   Hypertensive heart disease with CHF (congestive heart failure) ([place]) 08/04/2012   Nonischemic dilated cardiomyopathy ([place]) 51/01/5851   Chronic systolic heart failure ([place]) 05/05/2012   Dyspnea 03/29/2012   Anemia 03/29/2012    Past Surgical History:  Procedure Laterality Date   APPENDECTOMY     CARDIAC CATHETERIZATION  April 2013   normal coronaries   CARDIAC CATHETERIZATION [name] 09/12/2016   Procedure: Right/Left Heart Cath and Coronary Angiography;  Surgeon: Jolaine Artist, MD;  Location: [facility];  Service: Cardiovascular;  Laterality: [name];   DILITATION & CURRETTAGE/HYSTROSCOPY WITH VERSAPOINT RESECTION [name] 10/05/2015   Procedure: DILATATION & CURETTAGE/HYSTEROSCOPY WITH VERSAPOINT RESECTION;  Surgeon: [name], MD;  Location: [facility];  Service: Gynecology;  Laterality: [name];   EP study and ablation  01/07/13   Ablation of AVNRT and atrial tachycardia (arising from the anteroseptal RA 76m above the HIS)  ICD  08/13/2012   ICD GENERATOR CHANGEOUT [name] 11/14/2019   Procedure: ICD GENERATOR CHANGEOUT;  Surgeon: Deboraha Sprang, MD;  Location: [facility];  Service: Cardiovascular;  Laterality: [name];   IMPLANTABLE CARDIOVERTER DEFIBRILLATOR IMPLANT [name] 08/13/2012   Procedure: IMPLANTABLE CARDIOVERTER DEFIBRILLATOR IMPLANT;  Surgeon: Deboraha Sprang, MD;  Location: [facility] C[facility];  Service: Cardiovascular;  Laterality: [name];   LEFT HEART CATHETERIZATION WITH CORONARY ANGIOGRAM [name] 04/02/2012   Procedure: LEFT HEART CATHETERIZATION WITH CORONARY ANGIOGRAM;  Surgeon: Peter M Martinique, MD;  Location: [facility] CATH LAB;  Service: Cardiovascular;  Laterality: [name];   SUPRAVENTRICULAR TACHYCARDIA ABLATION [name] 01/07/2013   Procedure: SUPRAVENTRICULAR TACHYCARDIA ABLATION;  Surgeon: Thompson Grayer, MD;  Location: [facility] CATH LAB;  Service: Cardiovascular;  Laterality: [name];   TUBAL LIGATION      OB History   No obstetric history on file.      Home Medications    Prior to Admission medications   Medication Sig Start Date End Date Taking? Authorizing Provider  doxycycline (VIBRAMYCIN) 100 MG capsule Take 1 capsule (100 mg total) by mouth 2 (two) times daily. 06/10/22  Yes Nyoka Lint, PA-C  HYDROcodone bit-homatropine (HYCODAN) 5-1.5 MG/5ML syrup Take 5 mLs by mouth every 6 (six) hours as needed for cough. 06/10/22  Yes Nyoka Lint, PA-C  amiodarone (PACERONE) 200 MG tablet Take 1 tablet (200 mg total) by mouth daily. Monday through Friday. Please call [phone] to schedule an over due appointment for future refills. Thank you. 1st attempt. 05/26/22   Deboraha Sprang, MD  carvedilol (COREG) 12.5 MG tablet Take 1 tablet (12.5 mg total) by mouth 2 (two) times daily with a meal. Please keep upcoming appt in January 2023 with Dr. Caryl Comes before anymore refills. Thank you 10/23/21   Deboraha Sprang, MD  dapagliflozin propanediol (FARXIGA) 10 MG TABS tablet Take 1 tablet (10 mg total) by mouth daily. Needs appt for future refills 12/12/20   Bensimhon, Shaune Pascal, MD  Doxylamine Succinate, Sleep, (SLEEP AID PO) Take 1 tablet by mouth at bedtime as needed (sleep).     [provider]  ferrous sulfate 325 (65 FE) MG tablet Take 325 mg by mouth daily with breakfast.    [provider]  furosemide (LASIX) 40 MG tablet Take 1.5 tablets (60 mg total) by mouth  daily. please call [phone] to schedule follow up 03/31/22   Bensimhon, Shaune Pascal, MD  Multiple Vitamin (MULTIVITAMIN WITH MINERALS) TABS Take 1 tablet by mouth daily.    [provider]  potassium chloride SA (KLOR-CON M20) 20 MEQ tablet Take 2 tablets (40 mEq total) by mouth 2 (two) times daily. 03/12/22   Rafael Bihari, FNP  spironolactone (ALDACTONE) 25 MG tablet TAKE 1 TABLET(25 MG) BY MOUTH DAILY 10/23/21   Deboraha Sprang, MD    Family History Family History  Problem Relation Age of Onset   Breast cancer Mother    Cancer Father     Social History Social History   Tobacco Use   Smoking status: Never   Smokeless tobacco: Never  Vaping Use   Vaping Use: Never used  Substance Use Topics   Alcohol use: Yes    Alcohol/week: 1.0 standard drink of alcohol    Types: 1 Glasses of wine per week    Comment: daily   Drug use: No     Allergies   Patient has no known allergies.   Review of Systems Review of Systems  Constitutional:  Positive for chills, fatigue  and fever.  Respiratory:  Positive for cough, shortness of breath and wheezing.      Physical Exam Triage Vital Signs ED Triage Vitals  Enc Vitals Group     BP 06/10/22 1046 92/61     Pulse Rate 06/10/22 1046 83     Resp 06/10/22 1046 16     Temp 06/10/22 1046 98.5 F (36.9 C)     Temp Source 06/10/22 1046 Oral     SpO2 06/10/22 1046 93 %     Weight --      Height --      Head Circumference --      Peak Flow --      Pain Score 06/10/22 1050 7     Pain Loc --      Pain Edu? --      Excl. in [place]? --    No data found.  Updated Vital Signs BP 92/61 (BP Location: Left Arm)   Pulse 83   Temp 98.5 F (36.9 C) (Oral)   Resp 16   SpO2 93%   Visual Acuity Right Eye Distance:   Left Eye Distance:   Bilateral Distance:    Right Eye Near:   Left Eye Near:    Bilateral Near:     Physical Exam Constitutional:      Appearance: Normal appearance.  HENT:     Right Ear: Ear canal normal.  Tympanic membrane is injected.     Left Ear: Ear canal normal. Tympanic membrane is injected.     Mouth/Throat:     Mouth: Mucous membranes are moist.     Pharynx: Oropharynx is clear. No pharyngeal swelling or posterior oropharyngeal erythema.  Cardiovascular:     Rate and Rhythm: Normal rate and regular rhythm.     Heart sounds: Normal heart sounds.  Pulmonary:     Effort: Pulmonary effort is normal.     Breath sounds: Normal air entry. Examination of the right-lower field reveals rales. Examination of the left-lower field reveals rales. Rhonchi and rales present. No wheezing.  Lymphadenopathy:     Cervical: No cervical adenopathy.  Neurological:     Mental Status: She is alert.      UC Treatments / Results  Labs (all labs ordered are listed, but only abnormal results are displayed) Labs Reviewed  SARS CORONAVIRUS 2 (TAT 6-24 HRS)    EKG   Radiology DG Chest 2 View  Result Date: 06/10/2022 CLINICAL DATA:  Cough congestion and fever for 2 weeks. EXAM: CHEST - 2 VIEW COMPARISON:  04/06/2018 chest radiograph and prior studies FINDINGS: Enlargement of the cardiopericardial silhouette has increased. Possible mild pulmonary vascular congestion is noted. A LEFT AICD is again noted. Patchy peripheral opacity within the anterior mid RIGHT lung is identified. There is no evidence of pleural effusion or pneumothorax. IMPRESSION: 1. Anterior RIGHT mid lung opacity which may represent focal airspace disease/pneumonia or atelectasis. Radiographic follow-up to resolution is recommended. 2. Enlargement of the cardiopericardial silhouette, increased since 2019. Correlate with possibility of pericardial effusion. Mild pulmonary vascular congestion. Electronically Signed   By: Margarette Canada M.D.   On: 06/10/2022 11:37    Procedures Procedures (including critical care time)  Medications Ordered in UC Medications  albuterol (VENTOLIN HFA) 108 (90 Base) MCG/ACT inhaler 2 puff (2 puffs Inhalation  Given 06/10/22 1113)    Initial Impression / Assessment and Plan / UC Course  I have reviewed the triage vital signs and the nursing notes.  Pertinent labs & imaging results that  were available during my care of the patient were reviewed by me and considered in my medical decision making (see chart for details).    Plan: 1.  COVID test is pending. 2.  Patient advised to take doxycycline 100 mg twice daily until completed to treat the lower respiratory infection. 3.  Patient advised to take Hycodan 1 to 2 teaspoons every 6-8 hours as needed to control cough. 4.  Patient advised to use the albuterol inhaler 2 puffs every 6 hours as needed to control wheezing and shortness of breath. 5.  Patient advised to follow-up with PCP or return to urgent care if symptoms fail to improve. 6.  Advise repeat chest x-ray on next patient visit once the respiratory infection clears. Final Clinical Impressions(s) / UC Diagnoses   Final diagnoses:  Acute cough  Viral upper respiratory tract infection  Bronchitis     Discharge Instructions      Advised to use the Hycodan cough syrup 1 to 2 teaspoons every 6-8 hours as needed to control the cough and chest congestion. Advised to use doxycycline 100 mg, 1 twice daily until completed. Advised to use the albuterol inhaler 2 puffs every 4-6 hours to help decrease the wheezing and improve the chest congestion. Advised to follow-up with PCP or return to urgent care if symptoms fail to improve.    ED Prescriptions     Medication Sig Dispense Auth. Provider   HYDROcodone bit-homatropine (HYCODAN) 5-1.5 MG/5ML syrup Take 5 mLs by mouth every 6 (six) hours as needed for cough. 120 mL Nyoka Lint, PA-C   doxycycline (VIBRAMYCIN) 100 MG capsule Take 1 capsule (100 mg total) by mouth 2 (two) times daily. 20 capsule Nyoka Lint, PA-C      I have reviewed the PDMP during this encounter.   Nyoka Lint, PA-C 06/10/22 1141

## 2022-06-10 NOTE — Discharge Instructions (Addendum)
Advised to use the Hycodan cough syrup 1 to 2 teaspoons every 6-8 hours as needed to control the cough and chest congestion. Advised to use doxycycline 100 mg, 1 twice daily until completed. Advised to use the albuterol inhaler 2 puffs every 4-6 hours to help decrease the wheezing and improve the chest congestion. Advised to follow-up with PCP or return to urgent care if symptoms fail to improve.

## 2022-06-11 LAB — SARS CORONAVIRUS 2 (TAT 6-24 HRS): SARS Coronavirus 2: NEGATIVE

## 2022-06-19 ENCOUNTER — Ambulatory Visit (INDEPENDENT_AMBULATORY_CARE_PROVIDER_SITE_OTHER): Payer: Self-pay

## 2022-06-19 ENCOUNTER — Ambulatory Visit (HOSPITAL_COMMUNITY)
Admission: EM | Admit: 2022-06-19 | Discharge: 2022-06-19 | Disposition: A | Discharge status: Home / Self Care | Payer: Self-pay | Attending: Internal Medicine | Admitting: Internal Medicine

## 2022-06-19 ENCOUNTER — Other Ambulatory Visit: Payer: Self-pay

## 2022-06-19 ENCOUNTER — Encounter (HOSPITAL_COMMUNITY): Payer: Self-pay | Admitting: Emergency Medicine

## 2022-06-19 DIAGNOSIS — R0602 Shortness of breath: Secondary | ICD-10-CM

## 2022-06-19 DIAGNOSIS — I5023 Acute on chronic systolic (congestive) heart failure: Secondary | ICD-10-CM

## 2022-06-19 DIAGNOSIS — J189 Pneumonia, unspecified organism: Secondary | ICD-10-CM

## 2022-06-19 DIAGNOSIS — I5022 Chronic systolic (congestive) heart failure: Secondary | ICD-10-CM

## 2022-06-19 DIAGNOSIS — J9801 Acute bronchospasm: Secondary | ICD-10-CM

## 2022-06-19 DIAGNOSIS — R059 Cough, unspecified: Secondary | ICD-10-CM

## 2022-06-19 MED ORDER — PREDNISONE 20 MG PO TABS: 20.0000 mg | ORAL_TABLET | Freq: Every day | ORAL | 0 refills | Status: AC

## 2022-06-19 MED ORDER — DOXYCYCLINE HYCLATE 100 MG PO CAPS: 100.0000 mg | ORAL_CAPSULE | Freq: Two times a day (BID) | ORAL | 0 refills | Status: AC

## 2022-06-19 MED ORDER — AMOXICILLIN-POT CLAVULANATE 875-125 MG PO TABS: 1.0000 | ORAL_TABLET | Freq: Two times a day (BID) | ORAL | 0 refills | Status: DC

## 2022-06-19 MED ORDER — FUROSEMIDE 40 MG PO TABS: 40.0000 mg | ORAL_TABLET | Freq: Two times a day (BID) | ORAL | Status: DC

## 2022-06-19 NOTE — Telephone Encounter (Signed)
This is a CHF pt, Dr. Haroldine Laws

## 2022-06-19 NOTE — Discharge Instructions (Addendum)
Daily weights recommended Increase Lasix to 40 mg twice daily for the next week Decrease salt intake to less than 2 g a day Take antibiotics as prescribed Continue albuterol inhaler If symptoms worsen please go to the emergency department for further management.

## 2022-06-19 NOTE — ED Triage Notes (Signed)
Pt reports for over 2 weeks having cough, body aches with cough and SOB. Reports was seen here on 7/4 and told respiratory infection, given cough meds and antibiotics but symptoms have gotten worse.

## 2022-06-20 MED ORDER — CARVEDILOL 12.5 MG PO TABS: 12.5000 mg | ORAL_TABLET | Freq: Two times a day (BID) | ORAL | 6 refills | Status: DC

## 2022-06-21 NOTE — ED Provider Notes (Signed)
[place]    CSN: [id number] Arrival date & time: 06/19/22  1756      History   Chief Complaint Chief Complaint  Patient presents with   Cough   Shortness of Breath    HPI Sydney Shaffer is a 56 y.o. female with history of chronic systolic heart failure comes to urgent care with worsening shortness of breath, cough, generalized body aches and subjective fever.  Patient was seen on 7/4 in the urgent care with a diagnosis of respiratory infection.  She was treated with a course of antibiotics and antitussive medication.  Patient comes back to see [place] today with worsening symptoms.  She is also complaining of orthopnea and paroxysmal nocturnal dyspnea.  She endorses a 3 pillow orthopnea.  Shortness of breath is both at rest and with exertion.  She is taking Lasix 40 mg once daily for congestive heart failure.  No nausea, vomiting or diarrhea.  No chest pain or chest pressure.  Patient endorses lower extremity swelling.  Patient is unsure about his dry weight but she estimates that it will be around 170 pounds.  Patient weighs 187 pounds today.   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    Patient Active Problem List   Diagnosis Date Noted   Dilated cardiomyopathy ([place]) 06/04/2015   Symptomatic anemia 06/04/2015   Chest pain 06/04/2015   Abnormal uterine bleeding (AUB) 06/04/2015   Metrorrhagia 06/04/2015   Abdominal pain    Hypokalemia    Menorrhagia    VT (ventricular  tachycardia) ([place]) 07/22/2014   SVT (supraventricular tachycardia) ([place]) 03/30/2014   HTN (hypertension) 03/30/2014   Chest pressure- unspecified 02/03/2014   Atrial tachycardia ([place]) 10/01/2012   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   Hypertensive heart disease with CHF (congestive heart failure) ([place]) 08/04/2012   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   Chronic systolic heart failure ([place]) 05/05/2012   Dyspnea 03/29/2012   Anemia 03/29/2012    Past Surgical History:  Procedure Laterality Date   APPENDECTOMY     CARDIAC CATHETERIZATION  April 2013   normal coronaries   CARDIAC CATHETERIZATION [name] 09/12/2016   Procedure: Right/Left Heart Cath and Coronary Angiography;  Surgeon: Jolaine Artist, MD;  Location: [facility];  Service: Cardiovascular;  Laterality: [name];   DILITATION & CURRETTAGE/HYSTROSCOPY WITH VERSAPOINT RESECTION [name] 10/05/2015   Procedure: DILATATION & CURETTAGE/HYSTEROSCOPY WITH VERSAPOINT RESECTION;  Surgeon: [name], MD;  Location: [facility];  Service: Gynecology;  Laterality: [name];   EP study and ablation  01/07/13   Ablation of AVNRT and atrial tachycardia (arising from the anteroseptal RA 2m above the HIS)   ICD  08/13/2012   ICD GENERATOR CHANGEOUT [name] 11/14/2019   Procedure: ICD [place]  Surgeon: KDeboraha Sprang MD;  [facility];  Service: Cardiovascular;  Laterality: [name];   IMPLANTABLE CARDIOVERTER DEFIBRILLATOR IMPLANT [name] 08/13/2012   Procedure: IMPLANTABLE  CARDIOVERTER DEFIBRILLATOR IMPLANT;  Surgeon: Deboraha Sprang, MD;  Location[facility];  Service: Cardiovascular;  Laterality: [name];   LEFT HEART CATHETERIZATION WITH CORONARY ANGIOGRAM [name] 04/02/2012   Procedure: LEFT HEART CATHETERIZATION WITH CORONARY ANGIOGRAM;  Surgeon: Peter M Martinique, MD;  Location: [facility] LAB;  Service: Cardiovascular;  Laterality: [name];   SUPRAVENTRICULAR TACHYCARDIA ABLATION [name] 01/07/2013   Procedure:  SUPRAVENTRICULAR TACHYCARDIA ABLATION;  Surgeon: Thompson Grayer, MD;  Location: [facility] CATH LAB;  Service: Cardiovascular;  Laterality: [name];   TUBAL LIGATION      OB History   No obstetric history on file.      Home Medications    Prior to Admission medications   Medication Sig Start Date End Date Taking? Authorizing Provider  amoxicillin-clavulanate (AUGMENTIN) 875-125 MG tablet Take 1 tablet by mouth every 12 (twelve) hours. 06/19/22  Yes Emillie Chasen, Myrene Galas, MD  doxycycline (VIBRAMYCIN) 100 MG capsule Take 1 capsule (100 mg total) by mouth 2 (two) times daily for 7 days. 06/19/22 06/26/22 Yes Maahi Lannan, Myrene Galas, MD  furosemide (LASIX) 40 MG tablet Take 1 tablet (40 mg total) by mouth 2 (two) times daily for 5 days. 06/19/22 06/24/22 Yes Makani Seckman, Myrene Galas, MD  predniSONE (DELTASONE) 20 MG tablet Take 1 tablet (20 mg total) by mouth daily for 5 days. 06/19/22 06/24/22 Yes Mohini Heathcock, Myrene Galas, MD  amiodarone (PACERONE) 200 MG tablet Take 1 tablet (200 mg total) by mouth daily. Monday through Friday. Please call [phone] to schedule an over due appointment for future refills. Thank you. 1st attempt. 05/26/22   Deboraha Sprang, MD  carvedilol (COREG) 12.5 MG tablet Take 1 tablet (12.5 mg total) by mouth 2 (two) times daily with a meal. 06/20/22   Bensimhon, Shaune Pascal, MD  dapagliflozin propanediol (FARXIGA) 10 MG TABS tablet Take 1 tablet (10 mg total) by mouth daily. Needs appt for future refills 12/12/20   Bensimhon, Shaune Pascal, MD  Doxylamine Succinate, Sleep, (SLEEP AID PO) Take 1 tablet by mouth at bedtime as needed (sleep).     [provider]  ferrous sulfate 325 (65 FE) MG tablet Take 325 mg by mouth daily with breakfast.    [provider]  furosemide (LASIX) 40 MG tablet Take 1.5 tablets (60 mg total) by mouth daily. please call [phone] to schedule follow up 03/31/22   Bensimhon, Shaune Pascal, MD  HYDROcodone bit-homatropine (HYCODAN) 5-1.5 MG/5ML syrup Take 5 mLs by mouth every 6 (six)  hours as needed for cough. 06/10/22   Nyoka Lint, PA-C  Multiple Vitamin (MULTIVITAMIN WITH MINERALS) TABS Take 1 tablet by mouth daily.    [provider]  potassium chloride SA (KLOR-CON M20) 20 MEQ tablet Take 2 tablets (40 mEq total) by mouth 2 (two) times daily. 03/12/22   Rafael Bihari, FNP  spironolactone (ALDACTONE) 25 MG tablet TAKE 1 TABLET(25 MG) BY MOUTH DAILY 10/23/21   Deboraha Sprang, MD    Family History Family History  Problem Relation Age of Onset   Breast cancer Mother    Cancer Father     Social History Social History   Tobacco Use   Smoking status: Never   Smokeless tobacco: Never  Vaping Use   Vaping Use: Never used  Substance Use Topics   Alcohol use: Yes    Alcohol/week: 1.0 standard drink of alcohol    Types: 1 Glasses of wine per week    Comment: daily   Drug use: No     Allergies   Patient has  no known allergies.   Review of Systems Review of Systems  Constitutional:  Positive for fatigue, fever and unexpected weight change. Negative for appetite change and chills.  Eyes: Negative.   Respiratory:  Positive for cough, chest tightness and shortness of breath. Negative for wheezing.   Cardiovascular:  Negative for chest pain.  Gastrointestinal: Negative.   Genitourinary: Negative.   Musculoskeletal: Negative.   Neurological: Negative.      Physical Exam Triage Vital Signs ED Triage Vitals  Enc Vitals Group     BP 06/19/22 1922 120/83     Pulse Rate 06/19/22 1922 (!) 107     Resp 06/19/22 1922 20     Temp 06/19/22 1922 98.7 F (37.1 C)     Temp Source 06/19/22 1922 Oral     SpO2 06/19/22 1922 97 %     Weight --      Height --      Head Circumference --      Peak Flow --      Pain Score 06/19/22 1921 9     Pain Loc --      Pain Edu? --      Excl. in [place]? --    No data found.  Updated Vital Signs BP 120/83 (BP Location: Left Arm)   Pulse (!) 107   Temp 98.7 F (37.1 C) (Oral)   Resp 20   SpO2 97%   Visual  Acuity Right Eye Distance:   Left Eye Distance:   Bilateral Distance:    Right Eye Near:   Left Eye Near:    Bilateral Near:     Physical Exam Vitals and nursing note reviewed.  Constitutional:      Appearance: She is ill-appearing.  Cardiovascular:     Rate and Rhythm: Normal rate and regular rhythm.  Pulmonary:     Effort: Tachypnea present. No accessory muscle usage or respiratory distress.     Breath sounds: Examination of the right-middle field reveals decreased breath sounds and wheezing. Examination of the left-middle field reveals decreased breath sounds and wheezing. Examination of the right-lower field reveals decreased breath sounds, wheezing, rhonchi and rales. Examination of the left-lower field reveals decreased breath sounds, wheezing, rhonchi and rales. Decreased breath sounds, wheezing, rhonchi and rales present.  Abdominal:     Palpations: Abdomen is soft. There is no hepatomegaly or splenomegaly.  Musculoskeletal:     Right lower leg: Edema present.     Left lower leg: Edema present.  Skin:    General: Skin is warm.  Neurological:     Mental Status: She is alert.      UC Treatments / Results  Labs (all labs ordered are listed, but only abnormal results are displayed) Labs Reviewed - No data to display  EKG   Radiology DG Chest 2 View  Result Date: 06/19/2022 CLINICAL DATA:  Pacemaker cough short of breath EXAM: CHEST - 2 VIEW COMPARISON:  06/10/2022 04/06/2018 FINDINGS: Left-sided pacing device as before. Cardiomegaly with mild vascular congestion mild diffuse interstitial process suggestive of mild edema. Vague peripheral opacity in the right mid lung as before. No pneumothorax. IMPRESSION: 1. Cardiomegaly with vascular congestion and suspected mild interstitial pulmonary edema 2. Persistent vague peripheral opacity in the right mid lung, question focus of pneumonia, this is not significantly changed compared to prior. Radiographic follow-up in 4-6 weeks  recommended following antibiotic trial to ensure resolution, alternatively CT chest could be obtained if clinical symptoms are not consistent with pneumonia Electronically Signed  By: Donavan Foil M.D.   On: 06/19/2022 20:06    Procedures Procedures (including critical care time)  Medications Ordered in UC Medications - No data to display  Initial Impression / Assessment and Plan / UC Course  I have reviewed the triage vital signs and the nursing notes.  Pertinent labs & imaging results that were available during my care of the patient were reviewed by me and considered in my medical decision making (see chart for details).     Community-acquired pneumonia: Patient did not improve with a course of doxycycline I will treat patient with Augmentin 875-125 mg twice daily for 7 days and repeat doxycycline because.  Azithromycin would not be appropriate in this case because of possible prolonged QT from concurrent amiodarone use Chest x-ray is consistent with pneumonia Repeat chest x-ray is recommended in 4 to 6 weeks  2.  Acute on chronic systolic heart failure exacerbation: Patient is advised to decrease fluid intake to less than 2 L a day Increase Lasix to 40 mg twice daily Decrease salt intake to less than 2 g a day Daily weights Return precautions given Chest x-ray shows cardiomegaly with vascular congestion and mild interstitial edema  Final Clinical Impressions(s) / UC Diagnoses   Final diagnoses:  Pneumonia of right middle lobe due to infectious organism  Acute on chronic systolic heart failure (HCC)  Acute bronchospasm     Discharge Instructions      Daily weights recommended Increase Lasix to 40 mg twice daily for the next week Decrease salt intake to less than 2 g a day Take antibiotics as prescribed Continue albuterol inhaler If symptoms worsen please go to the emergency department for further management.    ED Prescriptions     Medication Sig Dispense  Auth. Provider   amoxicillin-clavulanate (AUGMENTIN) 875-125 MG tablet Take 1 tablet by mouth every 12 (twelve) hours. 14 tablet Hyder Deman, Myrene Galas, MD   doxycycline (VIBRAMYCIN) 100 MG capsule Take 1 capsule (100 mg total) by mouth 2 (two) times daily for 7 days. 14 capsule Maize Brittingham, Myrene Galas, MD   furosemide (LASIX) 40 MG tablet Take 1 tablet (40 mg total) by mouth 2 (two) times daily for 5 days. 30 tablet Sallyanne Birkhead, Myrene Galas, MD   predniSONE (DELTASONE) 20 MG tablet Take 1 tablet (20 mg total) by mouth daily for 5 days. 5 tablet Layman Gully, Myrene Galas, MD      PDMP not reviewed this encounter.   Chase Picket, MD 06/21/22 1058

## 2022-07-01 ENCOUNTER — Ambulatory Visit (INDEPENDENT_AMBULATORY_CARE_PROVIDER_SITE_OTHER): Payer: Self-pay

## 2022-07-01 ENCOUNTER — Other Ambulatory Visit: Payer: Self-pay

## 2022-07-01 ENCOUNTER — Telehealth (HOSPITAL_COMMUNITY): Payer: Self-pay | Admitting: Pharmacy Technician

## 2022-07-01 ENCOUNTER — Emergency Department (HOSPITAL_COMMUNITY): Payer: Self-pay

## 2022-07-01 ENCOUNTER — Emergency Department (HOSPITAL_COMMUNITY)
Admission: EM | Admit: 2022-07-01 | Discharge: 2022-07-01 | Disposition: A | Discharge status: Home / Self Care | Payer: Self-pay | Attending: Emergency Medicine | Admitting: Emergency Medicine

## 2022-07-01 ENCOUNTER — Other Ambulatory Visit (HOSPITAL_COMMUNITY): Payer: Self-pay

## 2022-07-01 DIAGNOSIS — R109 Unspecified abdominal pain: Secondary | ICD-10-CM | POA: Insufficient documentation

## 2022-07-01 DIAGNOSIS — R0602 Shortness of breath: Secondary | ICD-10-CM | POA: Insufficient documentation

## 2022-07-01 DIAGNOSIS — I110 Hypertensive heart disease with heart failure: Secondary | ICD-10-CM | POA: Insufficient documentation

## 2022-07-01 DIAGNOSIS — R0600 Dyspnea, unspecified: Secondary | ICD-10-CM | POA: Insufficient documentation

## 2022-07-01 DIAGNOSIS — R112 Nausea with vomiting, unspecified: Secondary | ICD-10-CM | POA: Insufficient documentation

## 2022-07-01 DIAGNOSIS — I5022 Chronic systolic (congestive) heart failure: Secondary | ICD-10-CM

## 2022-07-01 DIAGNOSIS — I420 Dilated cardiomyopathy: Secondary | ICD-10-CM

## 2022-07-01 DIAGNOSIS — R778 Other specified abnormalities of plasma proteins: Secondary | ICD-10-CM

## 2022-07-01 DIAGNOSIS — I5023 Acute on chronic systolic (congestive) heart failure: Secondary | ICD-10-CM | POA: Insufficient documentation

## 2022-07-01 DIAGNOSIS — R052 Subacute cough: Secondary | ICD-10-CM | POA: Insufficient documentation

## 2022-07-01 DIAGNOSIS — Z79899 Other long term (current) drug therapy: Secondary | ICD-10-CM | POA: Insufficient documentation

## 2022-07-01 DIAGNOSIS — I472 Ventricular tachycardia, unspecified: Secondary | ICD-10-CM

## 2022-07-01 DIAGNOSIS — R110 Nausea: Secondary | ICD-10-CM

## 2022-07-01 DIAGNOSIS — R7989 Other specified abnormal findings of blood chemistry: Secondary | ICD-10-CM | POA: Insufficient documentation

## 2022-07-01 LAB — BASIC METABOLIC PANEL
Anion gap: 8 (ref 5–15)
Anion gap: 14 (ref 5–15)
BUN: 19 mg/dL (ref 6–20)
BUN: 16 mg/dL (ref 6–20)
CO2: 26 mmol/L (ref 22–32)
CO2: 25 mmol/L (ref 22–32)
Calcium: 9.4 mg/dL (ref 8.9–10.3)
Calcium: 9.5 mg/dL (ref 8.9–10.3)
Chloride: 101 mmol/L (ref 98–111)
Chloride: 100 mmol/L (ref 98–111)
Creatinine, Ser: 1.16 mg/dL — ABNORMAL HIGH (ref 0.44–1.00)
Creatinine, Ser: 1.1 mg/dL — ABNORMAL HIGH (ref 0.44–1.00)
GFR, Estimated: 55 mL/min — ABNORMAL LOW (ref >60)
GFR, Estimated: 59 mL/min — ABNORMAL LOW (ref >60)
Glucose, Bld: 124 mg/dL — ABNORMAL HIGH (ref 70–99)
Glucose, Bld: 94 mg/dL (ref 70–99)
Potassium: 4 mmol/L (ref 3.5–5.1)
Potassium: 3.8 mmol/L (ref 3.5–5.1)
Sodium: 135 mmol/L (ref 135–145)
Sodium: 139 mmol/L (ref 135–145)

## 2022-07-01 LAB — CBC WITH DIFFERENTIAL/PLATELET
Abs Immature Granulocytes: 0.02 10*3/uL (ref 0.00–0.07)
Basophils Absolute: 0.1 10*3/uL (ref 0.0–0.1)
Basophils Relative: 1 %
Eosinophils Absolute: 0 10*3/uL (ref 0.0–0.5)
Eosinophils Relative: 0 %
HCT: 39.8 % (ref 36.0–46.0)
Hemoglobin: 13 g/dL (ref 12.0–15.0)
Immature Granulocytes: 0 %
Lymphocytes Relative: 27 %
Lymphs Abs: 2.2 10*3/uL (ref 0.7–4.0)
MCH: 27.7 pg (ref 26.0–34.0)
MCHC: 32.7 g/dL (ref 30.0–36.0)
MCV: 84.7 fL (ref 80.0–100.0)
Monocytes Absolute: 0.7 10*3/uL (ref 0.1–1.0)
Monocytes Relative: 8 %
Neutro Abs: 5.4 10*3/uL (ref 1.7–7.7)
Neutrophils Relative %: 64 %
Platelets: 316 10*3/uL (ref 150–400)
RBC: 4.7 MIL/uL (ref 3.87–5.11)
RDW: 14.9 % (ref 11.5–15.5)
WBC: 8.4 10*3/uL (ref 4.0–10.5)
nRBC: 0 % (ref 0.0–0.2)

## 2022-07-01 LAB — TROPONIN I (HIGH SENSITIVITY)
Troponin I (High Sensitivity): 108 ng/L (ref <18)
Troponin I (High Sensitivity): 89 ng/L — ABNORMAL HIGH (ref <18)

## 2022-07-01 LAB — MAGNESIUM: Magnesium: 2 mg/dL (ref 1.7–2.4)

## 2022-07-01 LAB — BRAIN NATRIURETIC PEPTIDE: B Natriuretic Peptide: 1104.5 pg/mL — ABNORMAL HIGH (ref 0.0–100.0)

## 2022-07-01 MED ORDER — DAPAGLIFLOZIN PROPANEDIOL 10 MG PO TABS
10.0000 mg | ORAL_TABLET | Freq: Every day | ORAL | 6 refills | Status: DC
  Filled 2022-07-01: qty 30, 30d supply, fill #0
  Filled 2022-10-01 – 2022-10-16 (×3): qty 30, 30d supply, fill #1
  Filled 2022-12-04: qty 30, 30d supply, fill #2
  Filled 2023-03-03: qty 30, 30d supply, fill #3
  Filled 2023-04-22: qty 30, 30d supply, fill #4

## 2022-07-01 MED ORDER — FUROSEMIDE 10 MG/ML IJ SOLN
80.0000 mg | Freq: Once | INTRAMUSCULAR | Status: AC
  Administered 2022-07-01: 80 mg via INTRAVENOUS
  Filled 2022-07-01: qty 8

## 2022-07-01 MED ORDER — POTASSIUM CHLORIDE CRYS ER 20 MEQ PO TBCR
40.0000 meq | EXTENDED_RELEASE_TABLET | Freq: Every day | ORAL | 3 refills | Status: DC
  Filled 2022-07-01: qty 60, 30d supply, fill #0

## 2022-07-01 MED ORDER — FUROSEMIDE 10 MG/ML IJ SOLN
80.0000 mg | Freq: Once | INTRAMUSCULAR | Status: AC
Start: 2022-07-01 — End: 2022-07-01
  Administered 2022-07-01: 80 mg via INTRAVENOUS
  Filled 2022-07-01: qty 8

## 2022-07-01 MED ORDER — FUROSEMIDE 40 MG PO TABS
80.0000 mg | ORAL_TABLET | Freq: Two times a day (BID) | ORAL | 3 refills | Status: DC
  Filled 2022-07-01: qty 120, 30d supply, fill #0

## 2022-07-01 MED ORDER — ASPIRIN 81 MG PO CHEW
324.0000 mg | CHEWABLE_TABLET | Freq: Once | ORAL | Status: AC
Start: 2022-07-01 — End: 2022-07-01
  Administered 2022-07-01: 324 mg via ORAL
  Filled 2022-07-01: qty 4

## 2022-07-01 MED ORDER — AMIODARONE HCL 200 MG PO TABS
200.0000 mg | ORAL_TABLET | Freq: Every day | ORAL | 6 refills | Status: DC
  Filled 2022-07-01: qty 30, 30d supply, fill #0

## 2022-07-01 MED ORDER — POTASSIUM CHLORIDE CRYS ER 20 MEQ PO TBCR
40.0000 meq | EXTENDED_RELEASE_TABLET | Freq: Once | ORAL | Status: AC
  Administered 2022-07-01: 40 meq via ORAL
  Filled 2022-07-01: qty 2

## 2022-07-01 MED ORDER — METOCLOPRAMIDE HCL 5 MG/ML IJ SOLN
10.0000 mg | Freq: Once | INTRAMUSCULAR | Status: AC
  Administered 2022-07-01: 10 mg via INTRAVENOUS
  Filled 2022-07-01: qty 2

## 2022-07-01 NOTE — Telephone Encounter (Signed)
Pharmacy Patient Advocate Encounter  Insurance verification completed.    The patient does not have any prescription coverage   The patient is currently admitted and ran test claims for the following: Entresto, Farxiga, Jardiance.  Copays and coinsurance results were relayed to Inpatient clinical team.

## 2022-07-01 NOTE — ED Triage Notes (Signed)
Pt here for N/V x 3 days. Pt was diagnosed w/ pneumonia 1 week ago at [facility] and was placed on oral antibiotic. Pt denies fevers, but reports she still has chest tightness w/ cough, productive cough, abd pain w/ nausea.

## 2022-07-01 NOTE — ED Provider Triage Note (Signed)
Emergency Medicine Provider Triage Evaluation Note  Sydney Shaffer , a 56 y.o. female  was evaluated in triage.  Pt complains of pneumonia, states she been having cough for the last 3 weeks, states is nonproductive, she is coughing, she is having some chest pain, associated chills fever, no associated leg swelling no shortness of breath or pleuritic chest pain.  Has finished doxycycline and recently finished a course of Augmentin.  Seen in urgent care but diagnosed with a right lung pneumonia..  Review of Systems  Positive: Cough, chest pain Negative: Pleuritic chest pain, edema  Physical Exam  BP (!) 119/91 (BP Location: Left Arm)   Pulse (!) 118   Temp 98 F (36.7 C) (Oral)   Resp (!) 22   SpO2 97%  Gen:   Awake, no distress   Resp:  Normal effort  MSK:   Moves extremities without difficulty  Other:    Medical Decision Making  Medically screening exam initiated at 3:06 AM.  Appropriate orders placed.  Kelsha Older was informed that the remainder of the evaluation will be completed by another provider, this initial triage assessment does not replace that evaluation, and the importance of remaining in the ED until their evaluation is complete.  Presents with cough and chest pain, lab work imaging been ordered will need further work-up.   Marcello Fennel, PA-C 07/01/22 [phone]

## 2022-07-01 NOTE — Discharge Instructions (Signed)
The cardiology team feel you are safe for discharge home after getting the diuresis today and having otherwise reassuring work-up.  They would like you to follow-up with them.  If any symptoms change or worsen acutely, please return to the nearest emergency department please take the medications a changed for you.

## 2022-07-01 NOTE — Consult Note (Signed)
Advanced Heart Failure Team Consult Note   Primary Physician: Patient, No Pcp Per PCP-Cardiologist:  None HF: Dr. Haroldine Laws  Reason for Consultation: Acute on chronic systolic CHF  HPI:    Sydney Shaffer is seen today for evaluation of acute on chronic systolic CHF at the request of Dr. Roxanne Mins with Emergency Medicine. 56 y.o. female with history of chronic systolic CHF d/t NICM s/p Medtronic ICD, HTN, SVT s/p ablation in 2015. Had atrial arrhythmias and VT in 2017 treated with amiodarone.  Had normal coronaries on cardiac cath in 2013.   Echo 6/19 EF 30-35% mild MR.  S/p ICD gen change 12/20  Had been lost to follow-up since 11/2019. Seen in HF clinic for follow-up in 04/23. Lasix increased d/t volume overload. Had been off Entresto several months, BP too soft to restart. Had short runs of AT/AF (longest 8 minutes on device).   Echo 04/23: EF 25-30%, LV severely dilated, grade III DD, RV okay, RVSP 49 mmHg, moderate MR, mild to moderate TR  She was treated for respiratory infection and prescribed course of doxycycline on 06/10/22. She was seen in Urgent Care again on 07/13 with dyspnea, cough, body aches and subjective fever. Also noted orthopnea and PND. CXR concerning for PNA and pulmonary edema. She was treated with course of Augmentin. Furosemide also increased 60 mg daily to 40 mg BID X 1 week. Reports she was taking 80 mg furosemide daily and increased to 100 mg daily X 1 week.  Patient returned to ED early this am with cough, CP, dyspnea, nausea and vomiting after coughing episodes. ECG sinus tachycardia 108 bpm. CXR with evidence of CHF. Troponin mildly elevated with flat trend. BNP elevated 1,104 (480 in 04/23), Scr 1.16, CO2 26, K 4.0, Na 135, WBC 8.4, Hgb 13.0. She was given 80 mg lasix IV. Presentation felt to be consistent with a/c CHF.   Dyspnea significantly improved after IV lasix. Reports good UOP, Is/Os not measured. Reports orthopnea, PND and DOE X 3 weeks. Home  weight up and down within a few lbs. Nausea and vomiting started after she started recent abx, now improving after completing course.   Reports she was out of diuretic for a few days prior to onset of fluid retention. Otherwise reports adherence with medical therapy. Reports watching fluid and sodium intake diligently.    Has medicaid application pending.  Medtronic ICD interrogation: Optivol fluid accumulation starting July 13th, 6 monitored VT episodes since last check 04/23 longest ?7 minutes in duration (no shocks or ATP), multiple AT/AF and SVT episodes longest 5 minutes in duration  Review of Systems: [y] = yes, '[ ]'$  = no   General: Weight gain [Y]; Weight loss '[ ]'$ ; Anorexia '[ ]'$ ; Fatigue [Y]; Fever '[ ]'$ ; Chills '[ ]'$ ; Weakness [Y]  Cardiac: Chest pain/pressure '[ ]'$ ; Resting SOB '[ ]'$ ; Exertional SOB [Y]; Orthopnea [Y]; Pedal Edema [Y]; Palpitations '[ ]'$ ; Syncope '[ ]'$ ; Presyncope '[ ]'$ ; Paroxysmal nocturnal dyspnea[Y]  Pulmonary: Cough [Y]; Wheezing'[ ]'$ ; Hemoptysis'[ ]'$ ; Sputum '[ ]'$ ; Snoring '[ ]'$   GI: Vomiting[Y]; Dysphagia'[ ]'$ ; Melena'[ ]'$ ; Hematochezia '[ ]'$ ; Heartburn'[ ]'$ ; Abdominal pain '[ ]'$ ; Constipation '[ ]'$ ; Diarrhea '[ ]'$ ; BRBPR '[ ]'$   GU: Hematuria'[ ]'$ ; Dysuria '[ ]'$ ; Nocturia'[ ]'$   Vascular: Pain in legs with walking '[ ]'$ ; Pain in feet with lying flat '[ ]'$ ; Non-healing sores '[ ]'$ ; Stroke '[ ]'$ ; TIA '[ ]'$ ; Slurred speech '[ ]'$ ;  Neuro: Headaches'[ ]'$ ; Vertigo'[ ]'$ ; Seizures'[ ]'$ ; Paresthesias'[ ]'$ ;Blurred vision '[ ]'$ ;  Diplopia '[ ]'$ ; Vision changes '[ ]'$   Ortho/Skin: Arthritis '[ ]'$ ; Joint pain '[ ]'$ ; Muscle pain '[ ]'$ ; Joint swelling '[ ]'$ ; Back Pain '[ ]'$ ; Rash '[ ]'$   Psych: Depression'[ ]'$ ; Anxiety'[ ]'$   Heme: Bleeding problems '[ ]'$ ; Clotting disorders '[ ]'$ ; Anemia [Y ]  Endocrine: Diabetes '[ ]'$ ; Thyroid dysfunction'[ ]'$   Home Medications Prior to Admission medications   Medication Sig Start Date End Date Taking? Authorizing Provider  amiodarone (PACERONE) 200 MG tablet Take 1 tablet (200 mg total) by mouth daily. Monday through Friday. Please call  [phone] to schedule an over due appointment for future refills. Thank you. 1st attempt. 05/26/22   Deboraha Sprang, MD  amoxicillin-clavulanate (AUGMENTIN) 875-125 MG tablet Take 1 tablet by mouth every 12 (twelve) hours. 06/19/22   Chase Picket, MD  carvedilol (COREG) 12.5 MG tablet Take 1 tablet (12.5 mg total) by mouth 2 (two) times daily with a meal. 06/20/22   Bensimhon, Shaune Pascal, MD  dapagliflozin propanediol (FARXIGA) 10 MG TABS tablet Take 1 tablet (10 mg total) by mouth daily. Needs appt for future refills 12/12/20   Bensimhon, Shaune Pascal, MD  Doxylamine Succinate, Sleep, (SLEEP AID PO) Take 1 tablet by mouth at bedtime as needed (sleep).     [provider]  ferrous sulfate 325 (65 FE) MG tablet Take 325 mg by mouth daily with breakfast.    [provider]  furosemide (LASIX) 40 MG tablet Take 1.5 tablets (60 mg total) by mouth daily. please call [phone] to schedule follow up 03/31/22   Bensimhon, Shaune Pascal, MD  furosemide (LASIX) 40 MG tablet Take 1 tablet (40 mg total) by mouth 2 (two) times daily for 5 days. 06/19/22 06/24/22  LampteyMyrene Galas, MD  HYDROcodone bit-homatropine (HYCODAN) 5-1.5 MG/5ML syrup Take 5 mLs by mouth every 6 (six) hours as needed for cough. 06/10/22   Nyoka Lint, PA-C  Multiple Vitamin (MULTIVITAMIN WITH MINERALS) TABS Take 1 tablet by mouth daily.    [provider]  potassium chloride SA (KLOR-CON M20) 20 MEQ tablet Take 2 tablets (40 mEq total) by mouth 2 (two) times daily. 03/12/22   Rafael Bihari, FNP  spironolactone (ALDACTONE) 25 MG tablet TAKE 1 TABLET(25 MG) BY MOUTH DAILY 10/23/21   Deboraha Sprang, MD    Past Medical History: Past Medical History:  Diagnosis Date   Anemia    felt to be due to heavy menstrual flow   Atrial tachycardia [facility])    s/p ablation   AVNRT (AV nodal re-entry tachycardia) ([place])    s/p ablation   CHF (congestive heart failure) ([place])    Dx 03/2012 - dilated cardiomyopathy with EF 20-25% by  echo (abnl nuc but normal coronaries 04/02/12 per cath.    Chronic systolic heart failure (HCC)    Dysrhythmia    Bradycardia   Hypertension    Hypertensive heart disease with CHF ([place])    ICD (implantable cardiac defibrillator) in place 08/13/2012    Past Surgical History: Past Surgical History:  Procedure Laterality Date   APPENDECTOMY     CARDIAC CATHETERIZATION  April 2013   normal coronaries   CARDIAC CATHETERIZATION [name] 09/12/2016   Procedure: Right/Left Heart Cath and Coronary Angiography;  Surgeon: Jolaine Artist, MD;  Location: [place] [facility];  Service: Cardiovascular;  Laterality: [name];   DILITATION & CURRETTAGE/HYSTROSCOPY WITH VERSAPOINT RESECTION [name] 10/05/2015   Procedure: DILATATION & CURETTAGE/HYSTEROSCOPY WITH VERSAPOINT RESECTION;  Surgeon: [name], MD;  Location: [place] [facility];  Service: Gynecology;  Laterality: [name];   EP study and ablation  01/07/13   Ablation of AVNRT and atrial tachycardia (arising from the anteroseptal RA 78m above the HIS)   ICD  08/13/2012   ICD GENERATOR CHANGEOUT [name] 11/14/2019   Procedure: ICD GFreedom  Surgeon: KDeboraha Sprang MD;  [facility];  Service: Cardiovascular;  Laterality: [name];   IMPLANTABLE CARDIOVERTER DEFIBRILLATOR IMPLANT [name] 08/13/2012   Procedure: IMPLANTABLE CARDIOVERTER DEFIBRILLATOR IMPLANT;  Surgeon: SDeboraha Sprang MD;  [facility];  Service: Cardiovascular;  Laterality: [name];   LEFT HEART CATHETERIZATION WITH CORONARY ANGIOGRAM [name] 04/02/2012   Procedure: LEFT HEART CATHETERIZATION WITH CORONARY ANGIOGRAM;  Surgeon: Peter M JMartinique MD;  [facility];  Service: Cardiovascular;  Laterality: [name];   SUPRAVENTRICULAR TACHYCARDIA ABLATION [name] 01/07/2013   Procedure: SUPRAVENTRICULAR TACHYCARDIA ABLATION;  Surgeon: JThompson Grayer MD;  [facility];  Service: Cardiovascular;  Laterality: [name];   TUBAL LIGATION      Family History: Family History  Problem Relation Age  of Onset   Breast cancer Mother    Cancer Father     Social History: Social History   Socioeconomic History   Marital status: Single    Spouse name: Not on file   Number of children: 3   Years of education: Not on file   Highest education level: Not on file  Occupational History   Occupation: [employer]ail [employer] [place]  Occupation: office cleaning  Tobacco Use   Smoking status: Never   Smokeless tobacco: Never  Vaping Use   Vaping Use: Never used  Substance and Sexual Activity   Alcohol use: Yes    Alcohol/week: 1.0 standard drink of alcohol    Types: 1 Glasses of wine per week    Comment: daily   Drug use: No   Sexual activity: Yes    Birth control/protection: Condom  Other Topics Concern   Not on file  Social History Narrative   Not on file   Social Determinants of Health   Financial Resource Strain: Not on file  Food Insecurity: Not on file  Transportation Needs: Not on file  Physical Activity: Not on file  Stress: Not on file  Social Connections: Not on file    Allergies:  No Known Allergies  Objective:    Vital Signs:   Temp:  [98 F (36.7 C)-98.1 F (36.7 C)] 98.1 F (36.7 C) (07/25 0623) Pulse Rate:  [96-118] 97 (07/25 0830) Resp:  [16-22] 20 (07/25 0830) BP: (115-129)/(79-93) 121/91 (07/25 0830) SpO2:  [96 %-100 %] 100 % (07/25 0830)    Weight change: There were no vitals filed for this visit.  Intake/Output:  No intake or output data in the 24 hours ending 07/01/22 0839    Physical Exam    General:  Sitting up in bed. No distress. HEENT: normal Neck: supple. JVP ~ 10 cm . Carotids 2+ bilat; no bruits.  Cor: PMI nondisplaced. Regular rate & rhythm. No rubs, gallops or murmurs. Lungs: clear Abdomen: soft, nontender, nondistended. Extremities: no cyanosis, clubbing, rash, edema Neuro: alert & orientedx3, cranial nerves grossly intact. moves all 4 extremities w/o difficulty. Affect pleasant   Telemetry   SR/sinus tach  100s >> improved to 90s  EKG    Sinus tach 108 bpm  Labs   Basic Metabolic Panel: Recent Labs  Lab 07/01/22 0313  NA 135  K 4.0  CL 101  CO2 26  GLUCOSE 124*  BUN 19  CREATININE 1.16*  CALCIUM 9.4    Liver Function Tests: No results for input(s): "AST", "ALT", "ALKPHOS", "BILITOT", "PROT", "ALBUMIN" in the last 168 hours. No results for input(s): "LIPASE", "AMYLASE" in the last 168 hours. No results for input(s): "AMMONIA" in the last 168 hours.  CBC: Recent Labs  Lab 07/01/22 0313  WBC 8.4  NEUTROABS 5.4  HGB 13.0  HCT 39.8  MCV 84.7  PLT 316    Cardiac Enzymes: No results for input(s): "CKTOTAL", "CKMB", "CKMBINDEX", "TROPONINI" in the last 168 hours.  BNP: BNP (last 3 results) Recent Labs    03/10/22 1511 03/28/22 1548 07/01/22 0313  BNP 401.2* 480.3* 1,104.5*    ProBNP (last 3 results) No results for input(s): "PROBNP" in the last 8760 hours.   CBG: No results for input(s): "GLUCAP" in the last 168 hours.  Coagulation Studies: No results for input(s): "LABPROT", "INR" in the last 72 hours.   Imaging   DG Chest 1 View  Result Date: 07/01/2022 CLINICAL DATA:  Shortness of breath and cough. EXAM: CHEST  1 VIEW COMPARISON:  June 19, 2022 FINDINGS: There is a dual lead AICD. There is stable, mild to moderate severity enlargement of the cardiac silhouette. Mild, stable prominence of the perihilar pulmonary vasculature is also noted. Mild, chronic appearing increased lung markings are seen with a small area of mild hazy atelectasis seen along the lateral aspect of the mid right lung. There is no evidence of a pleural effusion or pneumothorax. The visualized skeletal structures are unremarkable. IMPRESSION: 1. Stable cardiomegaly with mild pulmonary vascular congestion. Electronically Signed   By: Virgina Norfolk M.D.   On: 07/01/2022 03:29     Medications:     Current Medications:   Infusions:     Patient Profile   56 y.o. female with  hx chronic systolic CHF d/t NICM s/p ICD, HTN, SVT s/p ablation. Two recent urgent care visits for suspected pneumonia and volume overloaded. Now presenting with a/c CHF.  Assessment/Plan   Acute on chronic systolic CHF: - Normal cors by cath 2013. Nonischemic cardiomyopathy s/p Medtronic ICD.  - Echo (6/16): EF 30-35%. - Echo (10/17): EF 25-30%.   - Echo (6/19): EF 30-35%. - ICD generator change 11/14/19 - Echo (04/23): EF 25-30%, LV severely dilated, RV okay, mild to moderate TR, moderate MR - Presenting with a/c systolic CHF. Not sure how much symptoms over the last few weeks soley d/t CHF +/- component of possible PNA.  - Etiology for decompensation likely nonadherence. Had run out of lasix several days prior to developing HF symptoms. PharmD unable to verify her [name] has been filled over the last year. Doubt she is taking furosemide correctly (multiple doses listed in chart, difficult to discern what she is taking). - NYHA III. Appears mildly volume up. Responded well to IV lasix 80 mg X 1. Will give another 80 mg IV and 40 mEq K.  - If responds well to 2nd dose IV lasix can likely discharge this afternoon on 80 mg furosemide BID + 40 mEq K daily - Send refills for Farxiga to [facility] under HF fund. Doubt she has been taking.  - Continue coreg 12.5 BID - Continue spiro 25 mg daily - Consider Entresto at f/u  2. Hx SVT/atrial arrhythmias: - S/p SVT ablation in 2015 - Short runs SVT and AT/AF on device check, longest episode 5 minutes - On amiodarone 200 mg 5 days a week. Increasing to daily, see below.  3. VT: -  6 monitored VT episodes on device check, longest 7 minutes in duration. No ATP or shocks delivered. - Taking amiodarone 200 mg daily 5 days a week.  - Reviewed interrogation with Dr. Aundra Dubin. Increase amiodarone to daily.  - Needs f/u with EP. She no-showed her last appointment with Dr. Caryl Comes. - K okay, check magnesium level  3. HTN: -BP stable.  - Meds as  above.   SDOH: -Uninsured. Reports she has medicaid application pending.  -Poor health literacy. Poor compliance with medical therapy and   Length of Stay: 0  FINCH, LINDSAY N, PA-C  07/01/2022, 8:39 AM  Advanced Heart Failure Team Pager [phone] (M-F; 7a - 5p)  Please contact [facility] for night-coverage after hours (4p -7a ) and weekends on [email or web address]   Patient seen with PA, agree with the above note.   She has history of NICM with MDT ICD.  She has been noncompliant with meds and followup though she was seen in 4/23.  Echo at that time showed EF 25-30%.    She reports 2-3 weeks of worsening dyspnea, to the point that she came to the ER today.  BNP elevated with vascular congestion on CXR.  She received 80 mg IV Lasix with significant improvement.   She says that she is taking her medications, but based on refills, compliance with all her meds is questionable.   She is noted to have NSVT episodes as well as SVT episodes on device interrogation.  She has had no ICD discharges or ATP.  She is supposed to be taking amiodarone 200 mg daily.   General: NAD Neck: JVP 8-9 cm, no thyromegaly or thyroid nodule.  Lungs: Clear to auscultation bilaterally with normal respiratory effort. CV: Nondisplaced PMI.  Heart regular S1/S2, no S3/S4, no murmur.  Trace ankle edema.  No carotid bruit.  Normal pedal pulses.  Abdomen: Soft, nontender, no hepatosplenomegaly, no distention.  Skin: Intact without lesions or rashes.  Neurologic: Alert and oriented x 3.  Psych: Normal affect. Extremities: No clubbing or cyanosis.  HEENT: Normal.   Patient came to ER with acute/chronic systolic CHF, questionable medication compliance.  She diuresed well with Lasix 80 mg IV x 1 and feels better.  On exam, she really only looks mildly volume overloaded at this point.  - Would give Lasix 80 mg IV 1 more time then let her go home.  She can increase her home Lasix from 80 mg once daily to 80 mg bid with KCl 40  daily.   - Would continue (?restart) her other cardiac meds => Coreg, Farxiga, spironolactone.  - Will arrange for office visit within the next week.  - Given concerns about medication compliance, will enroll with paramedicine.   Patient has had both NSVT and SVT noted by device interrogation, no ICD discharges or ATP.  She follows with Dr. Caryl Comes.  She is supposed to be on amiodarone 200 mg daily, not clear to me that she is taking.  - Make sure she is taking amiodarone 200 mg daily . - She needs appt with Dr. Caryl Comes .  [name] 07/01/2022 11:14 AM

## 2022-07-01 NOTE — ED Provider Notes (Signed)
Note the patient has initial troponin of 103, reviewed EKG no evidence of acute STEMI present, charge nurse was made aware of this, patient will be roomed immediately.   Marcello Fennel, PA-C 07/01/22 0416    Orpah Greek, MD 07/01/22 [phone]

## 2022-07-01 NOTE — ED Notes (Signed)
Patient transported to X-ray 

## 2022-07-01 NOTE — Progress Notes (Signed)
CSW met patient in the ED briefly to discuss the [employer] and the benefits to assist patient with her care needs. Patient is agreeable to visit and CSW discussed with [name], CP who will make visit tomorrow afternoon. CSW discussed with Tammy Sours, LCSW who will follow up with patient to complete full CP assessment. CSW available as needed. Raquel Sarna, [place], [place]

## 2022-07-01 NOTE — ED Provider Notes (Signed)
7:37 AM Care assumed from Dr. Roxanne Mins.  At time of transfer of care, patient awaiting for cardiology evaluation and recommendations.  Patient is history of heart failure and was found to have a increased BNP and evidence of some new fluid overload.  Patient had a dose of IV Lasix in the emergency department and cardiology will assess to determine if she needs monitoring admission versus discharge home and close follow-up.  9:49 AM Heart failure team came and assessed the patient.  They feel that she needs a little more diuresis but can likely go home this afternoon.  They wanted to give her another dose of Lasix which they ordered and if she urinates well and has improvement in symptoms she will be safe for discharge home for close outpatient follow-up and they will increase her home Lasix and potassium supplementation.  We will assess her in several hours and if she is still doing well, we will plan to discharge.  If she does not improve, anticipate discussion with cardiology again.  10:58 AM Patient is in agreement with this plan.  We will reassess at the recommendation of an hour or 2 and if she is still doing well we will discharge.  Patient received her medicines from pharmacy and understands the plan of care.  She will be discharged after she continues to urinate and has reassuring vital signs.  Patient discharged in good condition for cards follow-up.  Clinical Impression: 1. Acute on chronic systolic heart failure (HCC)   2. Elevated troponin   3. Nausea   4. Subacute cough   5. Chronic systolic heart failure (HCC)     Disposition: Discharge  Condition: Good  I have discussed the results, Dx and Tx plan with the pt(& family if present). He/she/they expressed understanding and agree(s) with the plan. Discharge instructions discussed at great length. Strict return precautions discussed and pt &/or family have verbalized understanding of the instructions. No further questions at time of  discharge.    Current Discharge Medication List      Follow Up: [facility] [address] [id number] [place] [phone] Follow up on 07/08/2022 Advanced Heart Failure Clinic at [facility] 3 pm Entrance C, Free Valet Parking       Giulio Bertino, Gwenyth Allegra, MD 07/01/22 [id number]

## 2022-07-01 NOTE — ED Provider Notes (Signed)
[facility] EMERGENCY DEPARTMENT Provider Note   CSN: [id number] Arrival date & time: 07/01/22  0214     History  Chief Complaint  Patient presents with   Emesis   Abdominal Pain    Sydney Shaffer is a 56 y.o. female.  The history is provided by the patient.  Emesis Associated symptoms: abdominal pain   Abdominal Pain Associated symptoms: vomiting   She has history of hypertension, systolic heart failure, implanted pacemaker/defibrillator and comes in because of shortness of breath, cough, vomiting.  She has had a cough for the last 3 weeks.  Cough is productive of some yellowish sputum.  She was seen at urgent care and diagnosed with pneumonia and put on antibiotics.  She has not had any fever, chills, sweats.  She has chest pain only when she coughs.  She has had dyspnea, especially with exertion but also with laying flat and she has had episodes of paroxysmal nocturnal dyspnea.  For the last 2 days, she has had nausea and vomiting.  Of note, vomiting has only occurred with coughing paroxysms and with taking her antibiotic.  However, she does have persistent nausea.  She denies fever, chills, sweats.   Home Medications Prior to Admission medications   Medication Sig Start Date End Date Taking? Authorizing Provider  amiodarone (PACERONE) 200 MG tablet Take 1 tablet (200 mg total) by mouth daily. Monday through Friday. Please call [phone] to schedule an over due appointment for future refills. Thank you. 1st attempt. 05/26/22   Deboraha Sprang, MD  amoxicillin-clavulanate (AUGMENTIN) 875-125 MG tablet Take 1 tablet by mouth every 12 (twelve) hours. 06/19/22   Chase Picket, MD  carvedilol (COREG) 12.5 MG tablet Take 1 tablet (12.5 mg total) by mouth 2 (two) times daily with a meal. 06/20/22   Bensimhon, Shaune Pascal, MD  dapagliflozin propanediol (FARXIGA) 10 MG TABS tablet Take 1 tablet (10 mg total) by mouth daily. Needs appt for future refills 12/12/20   Bensimhon,  Shaune Pascal, MD  Doxylamine Succinate, Sleep, (SLEEP AID PO) Take 1 tablet by mouth at bedtime as needed (sleep).     [provider]  ferrous sulfate 325 (65 FE) MG tablet Take 325 mg by mouth daily with breakfast.    [provider]  furosemide (LASIX) 40 MG tablet Take 1.5 tablets (60 mg total) by mouth daily. please call [phone] to schedule follow up 03/31/22   Bensimhon, Shaune Pascal, MD  furosemide (LASIX) 40 MG tablet Take 1 tablet (40 mg total) by mouth 2 (two) times daily for 5 days. 06/19/22 06/24/22  LampteyMyrene Galas, MD  HYDROcodone bit-homatropine (HYCODAN) 5-1.5 MG/5ML syrup Take 5 mLs by mouth every 6 (six) hours as needed for cough. 06/10/22   Nyoka Lint, PA-C  Multiple Vitamin (MULTIVITAMIN WITH MINERALS) TABS Take 1 tablet by mouth daily.    [provider]  potassium chloride SA (KLOR-CON M20) 20 MEQ tablet Take 2 tablets (40 mEq total) by mouth 2 (two) times daily. 03/12/22   Rafael Bihari, FNP  spironolactone (ALDACTONE) 25 MG tablet TAKE 1 TABLET(25 MG) BY MOUTH DAILY 10/23/21   Deboraha Sprang, MD      Allergies    Patient has no known allergies.    Review of Systems   Review of Systems  Gastrointestinal:  Positive for abdominal pain and vomiting.  All other systems reviewed and are negative.   Physical Exam Updated Vital Signs BP 117/89 (BP Location: Left Arm)   Pulse Marland Kitchen)  108   Temp 98 F (36.7 C) (Oral)   Resp 18   SpO2 96%  Physical Exam Vitals and nursing note reviewed.   56 year old female, resting comfortably and in no acute distress. Vital signs are significant for mildly elevated heart rate. Oxygen saturation is 96%, which is normal. Head is normocephalic and atraumatic. PERRLA, EOMI. Oropharynx is clear. Neck is nontender and supple without adenopathy.  JVD is present. Back is nontender and there is no CVA tenderness. Lungs have bibasilar rales going about one third of the way up.  There are no wheezes or rhonchi. Chest is  nontender. Heart has regular rate and rhythm without murmur. Abdomen is soft, flat, nontender without masses or hepatosplenomegaly and peristalsis is normoactive. Extremities have 1+ pretibial edema, full range of motion is present. Skin is warm and dry without rash. Neurologic: Mental status is normal, cranial nerves are intact, moves all extremities equally.  ED Results / Procedures / Treatments   Labs (all labs ordered are listed, but only abnormal results are displayed) Labs Reviewed  BASIC METABOLIC PANEL - Abnormal; Notable for the following components:      Result Value   Glucose, Bld 124 (*)    Creatinine, Ser 1.16 (*)    GFR, Estimated 55 (*)    All other components within normal limits  BRAIN NATRIURETIC PEPTIDE - Abnormal; Notable for the following components:   B Natriuretic Peptide 1,104.5 (*)    All other components within normal limits  TROPONIN I (HIGH SENSITIVITY) - Abnormal; Notable for the following components:   Troponin I (High Sensitivity) 108 (*)    All other components within normal limits  CBC WITH DIFFERENTIAL/PLATELET  TROPONIN I (HIGH SENSITIVITY)    EKG EKG Interpretation  Date/Time:  Tuesday July 01 2022 03:14:27 EDT Ventricular Rate:  108 PR Interval:  170 QRS Duration: 92 QT Interval:  362 QTC Calculation: 485 R Axis:   -56 Text Interpretation: Sinus tachycardia Possible Left atrial enlargement Left anterior fasicular block Incomplete right bundle branch block Left ventricular hypertrophy ( R in aVL , Cornell product , Romhilt-Estes ) Abnormal ECG When compared with ECG of 10-Mar-2022 14:31, HEART RATE has increased Confirmed by Delora Fuel ([id number]) on 07/01/2022 5:44:47 AM  Radiology DG Chest 1 View  Result Date: 07/01/2022 CLINICAL DATA:  Shortness of breath and cough. EXAM: CHEST  1 VIEW COMPARISON:  June 19, 2022 FINDINGS: There is a dual lead AICD. There is stable, mild to moderate severity enlargement of the cardiac silhouette. Mild,  stable prominence of the perihilar pulmonary vasculature is also noted. Mild, chronic appearing increased lung markings are seen with a small area of mild hazy atelectasis seen along the lateral aspect of the mid right lung. There is no evidence of a pleural effusion or pneumothorax. The visualized skeletal structures are unremarkable. IMPRESSION: 1. Stable cardiomegaly with mild pulmonary vascular congestion. Electronically Signed   By: Virgina Norfolk M.D.   On: 07/01/2022 03:29    Procedures Procedures  Cardiac monitor shows sinus tachycardia, per my interpretation.  Medications Ordered in ED Medications  aspirin chewable tablet 324 mg (has no administration in time range)  furosemide (LASIX) injection 80 mg (has no administration in time range)    ED Course/ Medical Decision Making/ A&P                           Medical Decision Making Risk OTC drugs. Prescription drug management.   Dyspnea, cough,  vomiting in patient with known history of heart failure and recent diagnosis of pneumonia.  Differential diagnosis does include heart failure, pneumonia as well as pulmonary embolism, cardiac tamponade, pleural effusion.  Physical exam is most consistent with heart failure.  I have reviewed and interpreted the ECG, and my interpretation is sinus tachycardia, left anterior fascicular block, incomplete right bundle branch block unchanged from prior except for faster heart rate.  Chest x-ray shows cardiomegaly with mild pulmonary vascular congestion.  I have independently viewed the image, and agree with the radiologist's interpretation.  I have reviewed and interpreted her laboratory tests, and my interpretation is mild renal insufficiency, markedly elevated BNP which is more than twice the value that it was in April, mild elevation of troponin felt most likely to be demand ischemia rather than ACS, but will need to check repeat troponin.  I have reviewed her old records and noted urgent care  visits on 7/4 and 7/13 and note that she was put on doxycycline on 7/4, and put on prednisone as well as amoxicillin-clavulanic on 7/13.  At that time, furosemide was increased from 60 mg daily to 40 mg twice a day.  I have reviewed the x-rays on those visits, and I feel that those x-rays are much more consistent with heart failure than pneumonia.  I have ordered a repeat troponin level and I have ordered a dose of intravenous furosemide.  If troponin is stable and patient gets symptomatic relief with parenteral furosemide she should be able to be discharged with follow-up in the heart failure clinic.  She has had good diuresis.  Repeat troponin is pending.  She still seems slightly dyspneic at rest.  I will will place a consult for cardiology to evaluate whether she is safe for discharge.  Case is signed out to Dr. Sherry Ruffing  Final Clinical Impression(s) / ED Diagnoses Final diagnoses:  Acute on chronic systolic heart failure ([place])  Elevated troponin  Nausea  Subacute cough    Rx / DC Orders ED Discharge Orders     None         Delora Fuel, MD 86/75/44 [phone]

## 2022-07-02 ENCOUNTER — Other Ambulatory Visit (HOSPITAL_COMMUNITY): Payer: Self-pay

## 2022-07-02 ENCOUNTER — Other Ambulatory Visit (HOSPITAL_COMMUNITY): Payer: Self-pay | Admitting: *Deleted

## 2022-07-02 ENCOUNTER — Telehealth (HOSPITAL_COMMUNITY): Payer: Self-pay | Admitting: *Deleted

## 2022-07-02 ENCOUNTER — Telehealth (HOSPITAL_COMMUNITY): Payer: Self-pay | Admitting: Licensed Clinical Social Worker

## 2022-07-02 LAB — CUP PACEART REMOTE DEVICE CHECK
Battery Remaining Longevity: 108 mo
Battery Voltage: 3.01 V
Brady Statistic AP VP Percent: 0 %
Brady Statistic AP VS Percent: 0.01 %
Brady Statistic AS VP Percent: 0.03 %
Brady Statistic AS VS Percent: 99.95 %
Brady Statistic RA Percent Paced: 0.02 %
Brady Statistic RV Percent Paced: 0.03 %
Date Time Interrogation Session: 20230725142706
HighPow Impedance: 66 Ohm
Implantable Lead Implant Date: 20130906
Implantable Lead Implant Date: 20130906
Implantable Lead Location: 753859
Implantable Lead Location: 753860
Implantable Lead Model: 181
Implantable Lead Model: 5076
Implantable Lead Serial Number: 322962
Implantable Pulse Generator Implant Date: 20201207
Lead Channel Impedance Value: 304 Ohm
Lead Channel Impedance Value: 342 Ohm
Lead Channel Impedance Value: 399 Ohm
Lead Channel Pacing Threshold Amplitude: 0.5 V
Lead Channel Pacing Threshold Amplitude: 1.25 V
Lead Channel Pacing Threshold Pulse Width: 0.4 ms
Lead Channel Pacing Threshold Pulse Width: 0.4 ms
Lead Channel Sensing Intrinsic Amplitude: 11.875 mV
Lead Channel Sensing Intrinsic Amplitude: 11.875 mV
Lead Channel Sensing Intrinsic Amplitude: 2.25 mV
Lead Channel Sensing Intrinsic Amplitude: 2.25 mV
Lead Channel Setting Pacing Amplitude: 1.5 V
Lead Channel Setting Pacing Amplitude: 2.5 V
Lead Channel Setting Pacing Pulse Width: 0.4 ms
Lead Channel Setting Sensing Sensitivity: 0.3 mV

## 2022-07-02 MED ORDER — CARVEDILOL 3.125 MG PO TABS: 3.1250 mg | ORAL_TABLET | Freq: Two times a day (BID) | ORAL | 3 refills | Status: DC

## 2022-07-02 MED ORDER — CARVEDILOL 3.125 MG PO TABS
3.1250 mg | ORAL_TABLET | Freq: Two times a day (BID) | ORAL | 3 refills | Status: DC
  Filled 2022-07-02: qty 60, 30d supply, fill #0
  Filled 2022-12-04: qty 60, 30d supply, fill #1

## 2022-07-02 MED ORDER — SPIRONOLACTONE 25 MG PO TABS: 12.5000 mg | ORAL_TABLET | Freq: Every day | ORAL | 1 refills | Status: DC

## 2022-07-02 NOTE — Telephone Encounter (Signed)
Sydney Shaffer with paramedicine called to report pts sysolic bp was 82 and she was a little dizzy. Per Janett Billow [place],FNP decrease spiro to 12.5 and Coreg to 3.125. Do not take if sBP < 100 keep f/u next week.

## 2022-07-02 NOTE — Progress Notes (Signed)
Heart and Vascular Care Navigation  07/02/2022  Sydney Shaffer [birth year] [id number]  Reason for Referral: paramedicine   Engaged with patient by telephone for initial visit for Heart and Vascular Care Coordination.                                                                                                   Paramedicine Initial Assessment:  Housing:  In what kind of housing do you live? House/apt/trailer/shelter? Apartment   Do you rent/pay a mortgage/own?  Do you live with anyone? Lives by herself for now but is about to get a roommate  Are you currently worried about losing your housing? no   Social:  What is your current marital status? single  Do you have any children? 3 children 2 boys and a girl  Do you have family or friends who live locally? All her children do  Food:  No reports that she is able to get enough food in the house- not on food stamps.  Income:  What is your current source of income? Employment but is considering applying for disability but isn't sure how she would manage this without source of income to pay for basics.  States that roommate that is moving in has agreed to pay for their housing while she applies but she isn't sure she trusts this.  Do you have outstanding medical bills? Yes- mailing Happy:  Are you currently insured? no  Do you have prescription coverage? no  If no insurance, have you applied for coverage (Medicaid, disability, marketplace etc)? Attempted to get insurance through [facility] last year but has been sort of confusion.   CSW Garment/textile technologist to inquire about Medicaid screening.  CSW mailed pt CAFA application to help with current [facility] bills.  Transportation:  Do you have transportation to your medical appointments? Yes has her own car   Daily Health Needs: Do you have a working scale at home? Yes- weighs everyday  How do you manage your medications at home? Takes medications at 6am and 4pm- used  to preorganize medications but hasn't in the last couple of weeks.  Do you ever take your medications differently than prescribed? Yes due to inability to get medications because of cost  Do you have issues affording your medications? Yes- sometimes only getting what she can afford   Do you have any concerns with mobility at home? no  Do you use any assistive devices at home or have PCS at home? no  Do you have a PCP? [facility] in [place] remember their name  Are there any additional barriers you see to getting the care you need? no  CSW will continue to follow through paramedicine program and assist as needed.                                   HRT/VAS Care Coordination     Outpatient Care Team [facility]; Social Worker   Clinical biochemist Name: [name] [phone]   Social Worker  Name: Tammy Sours, [facility], [phone]   Living arrangements for the past 2 months Apartment   Lives with: Self   Patient Current Insurance Coverage Self-Pay   Patient Has Concern With Paying Medical Bills No   Does Patient Have Prescription Coverage? No   Patient Prescription Assistance Programs Heart Failure [facility] Assistive Devices/Equipment --  has heart monitor       Social History:                                                                             SDOH Screenings   Alcohol Screen: Not on file  Depression (BPJ1-2): Not on file  Financial Resource Strain: High Risk (07/02/2022)   Overall Financial Resource Strain (CARDIA)    Difficulty of Paying Living Expenses: Hard  Food Insecurity: No Food Insecurity (07/02/2022)   Hunger Vital Sign    Worried About Running Out of Food in the Last Year: Never true    Ran Out of Food in the Last Year: Never true  Housing: Low Risk  (07/02/2022)   Housing    Last Housing Risk Score: 0  Physical Activity: Not on file  Social Connections: Not on file  Stress: Not on file  Tobacco Use: Low Risk   (06/19/2022)   Patient History    Smoking Tobacco Use: Never    Smokeless Tobacco Use: Never    Passive Exposure: Not on file  Transportation Needs: No Transportation Needs (07/02/2022)   PRAPARE - Transportation    Lack of Transportation (Medical): No    Lack of Transportation (Non-Medical): No    SDOH Interventions: Financial Resources:  Financial Strain Interventions: Other (Comment) (HF fund) Works at this time but struggles to pay medications.  Food Insecurity:  Food Insecurity Interventions: Intervention Not Indicated  Housing Insecurity:  Housing Interventions: Intervention Not Indicated  Transportation:   Transportation Interventions: Intervention Not Indicated   Follow-up plan:    Pt to complete CAFA to help with outstanding bills  [name], [facility] Desk#: [phone] Cell#: [phone]

## 2022-07-02 NOTE — Progress Notes (Signed)
Paramedicine Encounter    Patient ID: Sydney Shaffer, female    DOB: [birth year], 56 y.o.   MRN: [id number]  Pt is new referral to paramedicine. She went to ER yesterday for IV lasix. She reports feels better but just a little dizzy that comes and goes.   She does live with roommate.  She is able to drive to appointments.  No insurance.  She was previously using [facility] discount [facility] plan-it lowers the cost of meds.  She is currently working in [place]. 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  She works night shift-suppose to work tonight 5pm-130am.  She reports compliance with meds however she has multiple pill bottles of same meds. Plus the bottles are dated filled back in June.  She did have a full bottle of the amio dated from July too but it was full.  She reports having a sch to take meds at 6am and 3pm.  She did admit to missing doses but not as many as it looks like it adds up to be with her pill bottle dates and tabs left.   Appetite was decreased  but now starting to pick back up.   Meds reviewed-her amio was listed as mon-fri on the rx.   I did fill up pill box for her as written per epic list-but upon checking her v/s her b/p low so contacted triage clinic for further-also confirmed her amio needs to be daily instead of the mon-fri. She does look like she doesn't feel well.  Since she does not have insurance will ask clinic to see if she can utilize the HF fund for future refills of meds.    BP (!) 82/0   Pulse 82   Resp 16   Wt 168 lb (76.2 kg)   SpO2 98%   BMI 26.31 kg/m  Weight yesterday-? Last visit weight-187  Patient Care Team: Patient, No Pcp Per as PCP - General (General Practice)  Patient Active Problem List   Diagnosis Date Noted   Dilated cardiomyopathy ([place]) 06/04/2015   Symptomatic anemia 06/04/2015   Chest pain 06/04/2015   Abnormal uterine bleeding (AUB) 06/04/2015    Metrorrhagia 06/04/2015   Abdominal pain    Hypokalemia    Menorrhagia    VT (ventricular tachycardia) ([place]) 07/22/2014   SVT (supraventricular tachycardia) ([place]) 03/30/2014   HTN (hypertension) 03/30/2014   Chest pressure- unspecified 02/03/2014   Atrial tachycardia ([place]) 10/01/2012   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   Hypertensive heart disease with CHF (congestive heart failure) ([place]) 08/04/2012   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 Needs appt for future refills, Disp: 30 tablet, Rfl: 6   ferrous sulfate 325 (65 FE) MG tablet, Take 325 mg by mouth daily with breakfast., Disp: , Rfl:    furosemide (LASIX) 40 MG tablet, Take 2 tablets (80 mg total) by mouth 2 (two) times daily. please call [phone] to schedule follow  up, Disp: 120 tablet, Rfl: 3   Multiple Vitamin (MULTIVITAMIN WITH MINERALS) TABS, Take 1 tablet by mouth daily., Disp: , Rfl:    potassium chloride SA (KLOR-CON M) 20 MEQ tablet, Take 2 tablets (40 mEq total) by mouth daily., Disp: 60 tablet, Rfl: 3   spironolactone (ALDACTONE) 25 MG tablet, TAKE 1 TABLET(25 MG) BY MOUTH DAILY (Patient taking differently: Take 25 mg by mouth daily.), Disp: 30 tablet, Rfl: 1   amoxicillin-clavulanate (AUGMENTIN) 875-125 MG tablet, Take 1 tablet by mouth every 12 (twelve) hours. (Patient not taking: Reported on 07/01/2022), Disp: 14 tablet, Rfl: 0   HYDROcodone bit-homatropine (HYCODAN) 5-1.5 MG/5ML syrup, Take 5 mLs by mouth every 6 (six)  hours as needed for cough. (Patient not taking: Reported on 07/01/2022), Disp: 120 mL, Rfl: 0 No Known Allergies    Social History   Socioeconomic History   Marital status: Single    Spouse name: Not on file   Number of children: 3   Years of education: Not on file   Highest education level: Not on file  Occupational History   Occupation: receptionist    Employer: [employer]   Occupation: office cleaning  Tobacco Use   Smoking status: Never   Smokeless tobacco: Never  Vaping Use   Vaping Use: Never used  Substance and Sexual Activity   Alcohol use: Yes    Alcohol/week: 1.0 standard drink of alcohol    Types: 1 Glasses of wine per week    Comment: daily   Drug use: No   Sexual activity: Yes    Birth control/protection: Condom  Other Topics Concern   Not on file  Social History Narrative   Not on file   Social Determinants of Health   Financial Resource Strain: High Risk (07/02/2022)   Overall Financial Resource Strain (CARDIA)    Difficulty of Paying Living Expenses: Hard  Food Insecurity: No Food Insecurity (07/02/2022)   Hunger Vital Sign    Worried About Running Out of Food in the Last Year: Never true    Ran Out of Food in the Last Year: Never true  Transportation Needs: No Transportation Needs (07/02/2022)   PRAPARE - Hydrologist (Medical): No    Lack of Transportation (Non-Medical): No  Physical Activity: Not on file  Stress: Not on file  Social Connections: Not on file  Intimate Partner Violence: Not on file    Physical Exam      Future Appointments  Date Time Provider [place]  07/08/2022  3:00 PM [facility] PA/NP [facility] None  09/30/2022  8:20 AM [facility] DEVICE REMOTES [facility]  12/30/2022  8:20 AM [facility] DEVICE REMOTES [facility]  03/31/2023  8:20 AM [facility] DEVICE REMOTES [facility]       [name], EMT-Paramedic [place]  Paramedic  07/02/22

## 2022-07-02 NOTE — Progress Notes (Signed)
Came back out ref med changes--she is to only take spiro-decreased dose at 12.5 and carvedilol-decreased dose at 3.125 only if her systolic b/p is above 130.  I was able to get bp cuff from clinic for her to use, so I changed that in pill box and showed her how to use/read it.   She will take b/p in morning prior to meds and text me the reading.   [name], [place] 07/02/2022

## 2022-07-08 ENCOUNTER — Other Ambulatory Visit (HOSPITAL_COMMUNITY): Payer: Self-pay

## 2022-07-08 ENCOUNTER — Ambulatory Visit (HOSPITAL_COMMUNITY)
Admit: 2022-07-08 | Discharge: 2022-07-08 | Disposition: A | Discharge status: Home / Self Care | Payer: Self-pay | Attending: Physician Assistant | Admitting: Physician Assistant

## 2022-07-08 ENCOUNTER — Encounter (HOSPITAL_COMMUNITY): Payer: Self-pay

## 2022-07-08 ENCOUNTER — Telehealth (HOSPITAL_COMMUNITY): Payer: Self-pay

## 2022-07-08 VITALS — BP 110/85 | HR 110 | Wt 185.0 lb

## 2022-07-08 DIAGNOSIS — Z79899 Other long term (current) drug therapy: Secondary | ICD-10-CM | POA: Insufficient documentation

## 2022-07-08 DIAGNOSIS — I472 Ventricular tachycardia, unspecified: Secondary | ICD-10-CM

## 2022-07-08 DIAGNOSIS — I471 Supraventricular tachycardia: Secondary | ICD-10-CM | POA: Insufficient documentation

## 2022-07-08 DIAGNOSIS — I5022 Chronic systolic (congestive) heart failure: Secondary | ICD-10-CM | POA: Insufficient documentation

## 2022-07-08 DIAGNOSIS — I420 Dilated cardiomyopathy: Secondary | ICD-10-CM | POA: Insufficient documentation

## 2022-07-08 DIAGNOSIS — I959 Hypotension, unspecified: Secondary | ICD-10-CM | POA: Insufficient documentation

## 2022-07-08 DIAGNOSIS — I110 Hypertensive heart disease with heart failure: Secondary | ICD-10-CM | POA: Insufficient documentation

## 2022-07-08 LAB — BASIC METABOLIC PANEL
Anion gap: 7 (ref 5–15)
BUN: 17 mg/dL (ref 6–20)
CO2: 27 mmol/L (ref 22–32)
Calcium: 9.2 mg/dL (ref 8.9–10.3)
Chloride: 105 mmol/L (ref 98–111)
Creatinine, Ser: 1.12 mg/dL — ABNORMAL HIGH (ref 0.44–1.00)
GFR, Estimated: 57 mL/min — ABNORMAL LOW (ref >60)
Glucose, Bld: 104 mg/dL — ABNORMAL HIGH (ref 70–99)
Potassium: 3.4 mmol/L — ABNORMAL LOW (ref 3.5–5.1)
Sodium: 139 mmol/L (ref 135–145)

## 2022-07-08 LAB — BRAIN NATRIURETIC PEPTIDE: B Natriuretic Peptide: 779.3 pg/mL — ABNORMAL HIGH (ref 0.0–100.0)

## 2022-07-08 NOTE — Telephone Encounter (Signed)
Left message for Sydney Shaffer to return my call to set up home paramedicine visit for tomorrow. I will follow up again tomorrow with a call and text.   [name], [place] 07/08/2022

## 2022-07-08 NOTE — Progress Notes (Addendum)
Advanced Heart Failure Clinic Note   PCP: Linward Natal, PA-C ([facility]) EP: Dr Caryl Comes Medtronic ICD HF Cardiologist: Dr. Haroldine Laws  HPI: Sydney Shaffer is a 56 y.o.female with a history of chronic systolic HF due to nonischemic cardiomyopathy s/p Medtronic ICD, HTN, and SVT s/p 01/08/14 ablation.  Cath 4/13 normal coronaries.  Lost to follow up until 5/19. Echo 6/19 EF 30-35% mild MR. Saw Dr. Caryl Comes in 9/20 and had some recurrent brief. SVT. No VT. S/p ICD gen change 12/20.  Reestablished with HF clinic in April 2023. Had been out of some medications.   Echo 04/23: EF 25-30%, LV severely dilated, grade III DD, RV okay, RVSP 49 mmHg, moderate MR, mild to moderate TR   Seen in Urgent Care twice in July with dyspnea and cough and treated for PNA and diuretics increased temporarily. Returned to ED on 07/01/22 with recurrent symptoms and was volume overloaded. HF consulted. Diuresed with 80 mg lasix IV BID. Had been out of diuretics for several days and not taking all of her medications. She was given prescriptions for lasix and farxiga and referred for paramedicine.  Amiodarone increased d/t episodes of VT (monitor only) and runs SVT and AF.   Here today for f/u. Her spiro was recently decreased to 12.5 mg daily and coreg cut back to 3.125 mg BID d/t hypotension. Has not picked up new Rx for coreg from pharmacy. Dyspnea significantly improved. No orthopnea, PND or lower extremity edema. Weight variable within a few lbs at home, 184 lb today.   Cardiac Studies: ECHO 07/05/12 EF 20-25%  ECHO 02/04/14 EF 20-25%  ECHO 6/16 EF 30-35% mild MR ECHO 09/2016 EF 25-30%, Grade 1 DD  - R/LHC (10/17): normal cors  RA = 3 RV = 23/4 PA = 22/6 (14) PCW = 8 Ao = 123/69 (91) LV = 106/6 Fick cardiac output/index = 7.4/3.5 PVR = < 1.0 WU SVR = 958  FA sat = 88% PA sat = 63%, 62%   - CPX (5/18)  FVC 1.99 (61%)      FEV1 1.59 (61%)        FEV1/FVC 80 (99%)        MVV 58  (57%)       Resting  HR: 81 Peak HR: 121   (72% age predicted max HR) BP rest: 108/68 BP peak: 144/80 Peak VO2: 12.3 (61% predicted peak VO2) VE/VCO2 slope:  25 OUES: 2.13 Peak RER: 0.98 Ventilatory Threshold: 11.2 (55% predicted or measured peak VO2) VE/MVV:  59% PETCO2 at peak:  40 O2pulse:  10   (91% predicted O2pulse)  - CPX (6/15): FVC 1.69 (51%)  FEV1 1.24 (47%)  FEV1/FVC 74%  Peak VO2: 12.6 (57.8% predicted peak VO2) VE/VCO2 slope: 28.6 OUES: 1.60 Peak RER: 1.01 Peak VO2 adjusted to the patient's ideal body weight of 150 lb (67.8 kg) the peak VO2 is 16.5 ml/kg (ibw)/min (62% of the ibw-adjusted predicted).   SH: She is not a smoker. She does not drink alcohol. Lives alone. She has 3 grown children   FH: Mom breast cancer. Father cancer.   ROS: All systems negative except as listed in HPI, PMH and Problem List.  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    Chronic systolic heart failure (HCC)    Dysrhythmia    Bradycardia   Hypertension    Hypertensive heart disease with CHF ([place])    ICD (implantable cardiac defibrillator) in place 08/13/2012   Current Outpatient Medications  Medication Sig Dispense Refill   amiodarone (PACERONE) 200 MG tablet Take 1 tablet (200 mg total) by mouth daily. 30 tablet 6   dapagliflozin propanediol (FARXIGA) 10 MG TABS tablet Take 1 tablet (10 mg total) by mouth daily. Needs appt for future refills 30 tablet 6   ferrous sulfate 325 (65 FE) MG tablet Take 325 mg by mouth daily with breakfast.     furosemide (LASIX) 40 MG tablet Take 2 tablets (80 mg total) by mouth 2 (two) times daily. please call [phone] to schedule follow up 120 tablet 3   Multiple  Vitamin (MULTIVITAMIN WITH MINERALS) TABS Take 1 tablet by mouth daily.     potassium chloride SA (KLOR-CON M) 20 MEQ tablet Take 2 tablets (40 mEq total) by mouth daily. 60 tablet 3   spironolactone (ALDACTONE) 25 MG tablet Take 0.5 tablets (12.5 mg total) by mouth daily. TAKE 1 TABLET(25 MG) BY MOUTH DAILY. Hold if systolic blood pressure less than 100. 30 tablet 1   amoxicillin-clavulanate (AUGMENTIN) 875-125 MG tablet Take 1 tablet by mouth every 12 (twelve) hours. 14 tablet 0   carvedilol (COREG) 3.125 MG tablet Take 1 tablet (3.125 mg total) by mouth 2 (two) times daily with a meal. (Patient not taking: Reported on 07/08/2022) 60 tablet 3   HYDROcodone bit-homatropine (HYCODAN) 5-1.5 MG/5ML syrup Take 5 mLs by mouth every 6 (six) hours as needed for cough. 120 mL 0   No current facility-administered medications for this encounter.   BP 110/85   Pulse (!) 110   Wt 83.9 kg (185 lb)   SpO2 98%   BMI 28.98 kg/m   Wt Readings from Last 3 Encounters:  07/08/22 83.9 kg (185 lb)  07/02/22 76.2 kg (168 lb)  03/10/22 85.2 kg (187 lb 12.8 oz)    PHYSICAL EXAM: General:  Well appearing. Ambulated into clinic. HEENT: normal Neck: supple. no JVD. Carotids 2+ bilat; no bruits.  Cor: PMI nondisplaced. Regular rate & rhythm, tachy. No rubs, gallops or murmurs. Lungs: clear Abdomen: soft, nontender, nondistended.  Extremities: no cyanosis, clubbing, rash, edema Neuro: alert & orientedx3, cranial nerves grossly intact. moves all 4 extremities w/o difficulty. Affect pleasant   ECG (personally reviewed): Sinus tach 108 bpm   ASSESSMENT & PLAN: 1. Chronic Systolic Heart Failure:  - Normal cors by cath 2013. Nonischemic cardiomyopathy s/p Medtronic ICD.  - Echo (6/16): EF 30-35%. - Echo (10/17): EF 25-30%.   - Echo (6/19): EF 30-35%. - Echo (04/23): EF 25-30% - ICD generator change 11/14/19 - Stable NYHA II-early III. Volume looks good today. Continue furosemide 80 mg BID. - No BP room for  ARNi/ARB. - Continue spiro 12.5 mg daily - Restart coreg at 3.125 mg BID, she states she will pick up Rx today - Continue Farxiga 10 mg daily.            - BMET/BNP today                                                                                            -  2. SVT and VT:  - s/p atrial tachycardia ablation in 01/2014.   - Followed by Dr. Caryl Comes, missed last appointment. Needs to reschedule. - Device check 07/25: 6 monitored VT, runs SVT. Amiodarone increased to 200 mg daily. - TSH and LFTs okay in 04/23  3. SDOH -Compliance has improved with Paramedicine program. Appreciate assistance. -Uninsured. Will engage [place] to assist.  Follow up 4 - 6 weeks with APP for medication titraion  Ernie Kasler N, PA-C 07/08/2022

## 2022-07-08 NOTE — Patient Instructions (Signed)
Please pick up your carvedilol  Labs done today, your results will be available in MyChart, we will contact you for abnormal readings.  Your physician recommends that you schedule a follow-up appointment in: 4 weeks  If you have any questions or concerns before your next appointment please send [place] a message through [place] or call our office at [phone].    TO LEAVE A MESSAGE FOR THE NURSE SELECT OPTION 2, PLEASE LEAVE A MESSAGE INCLUDING: YOUR NAME DATE OF BIRTH CALL BACK NUMBER REASON FOR CALL**this is important as we prioritize the call backs  YOU WILL RECEIVE A CALL BACK THE SAME DAY AS LONG AS YOU CALL BEFORE 4:00 PM  At the [facility], you and your health needs are our priority. As part of our continuing mission to provide you with exceptional heart care, we have created designated Provider Care Teams. These Care Teams include your primary Cardiologist (physician) and Advanced Practice Providers (APPs- Physician Assistants and Nurse Practitioners) who all work together to provide you with the care you need, when you need it.   You may see any of the following providers on your designated Care Team at your next follow up: Dr Glori Bickers Dr Haynes Kerns, NP Lyda Jester, [place] [facility] [place], [place] Audry Riles, PharmD   Please be sure to bring in all your medications bottles to every appointment.

## 2022-07-09 ENCOUNTER — Telehealth (HOSPITAL_COMMUNITY): Payer: Self-pay

## 2022-07-09 NOTE — Telephone Encounter (Signed)
Spoke to [employer] who stated she was unavailable today as she is working today but is able to be seen tomorrow at 11:00- [place] will be seeing her tomorrow at that time.   I reminded her to pick up her meds at [facility] prior to Dede coming out.   Call complete.   [name], [place] 07/09/2022

## 2022-07-09 NOTE — Telephone Encounter (Signed)
Left message for Tyyne to return my call to set up home paramedicine visit for today. Text message left also. I will continue to follow up.   [name], [place] 07/09/2022

## 2022-07-10 ENCOUNTER — Telehealth (HOSPITAL_COMMUNITY): Payer: Self-pay | Admitting: Emergency Medicine

## 2022-07-10 ENCOUNTER — Other Ambulatory Visit (HOSPITAL_COMMUNITY): Payer: Self-pay | Admitting: Emergency Medicine

## 2022-07-10 ENCOUNTER — Telehealth (HOSPITAL_COMMUNITY): Payer: Self-pay | Admitting: Physician Assistant

## 2022-07-10 NOTE — Telephone Encounter (Signed)
-----   Message from [place], [place] sent at 07/10/2022  3:33 PM EDT ----- Regarding: FW: Carvedilol and Arlyce Harman  ----- Message ----- From: Renee Ramus, EMT Sent: 07/10/2022  11:47 AM EDT To: [facility] Triage Pool Subject: Carvedilol and Spiro                           BP 90/60 today.  Notes advised to hold Carvedilol 3.'125mg'$ . if BP <100 and also hold Spironolactone 12.'5mg'$ . if pressure < 161 Systolic.    Holding morning dose of carvedilol due to pressure.  Pt will recheck pressure with her home machine this afternoon and understands she can take the Carvedilol at that time if her pressure >100  Also held out [place] due to pressure.  Do you want her to take it later if her pressure comes up?   Please advise.  Thanks, Dede

## 2022-07-10 NOTE — Telephone Encounter (Signed)
Status of pt's meds for today's visit is pill box minus Carvedilol and Spironolactone due to BP 90/60.  Got message from [place] that pt could still take the 12.'5mg'$ . of Spironolactone.  Due to timing of visits today I was unable to reach her and she works 5:00 p.m. to 1:00 a.m.  I will call her first thing in the morning and reassess her BP.  I advised pt. To call me should she have any questions prior to me reach back out to her.

## 2022-07-10 NOTE — Progress Notes (Signed)
Paramedicine Encounter    Patient ID: Sydney Shaffer, female    DOB: [birth year], 56 y.o.   MRN: [id number]   BP 100/70 (BP Location: Right Arm, Patient Position: Sitting, Cuff Size: Normal)   Pulse 88   Resp 16   SpO2 95%  Weight yesterday-not scale/broken  Last visit weight-not available  Initial visit with Ms. Entwistle today.  Pt. CA&O x4, skin W&D w/ good color.  She denies chest pain or SOB.  Lung sounds clear and equal bilat.  Meds reviewed and pill box reconciled.  Notes advised to hold Carvedilol and Spiro if BP <409 Systolic.  Manual pressure 90/60.  Reached out to HF Triage and spoke with [facility] who advised to hold her Carvedilol for the morning and recheck pressure that evening and take evening dose only if pressure > 100.  Did not include Arlyce Harman in her med box either due to her pressure.  Her recent change in dosage is 12.'5mg'$  daily.  I will reach out to her this afternoon to follow up on BP.  She understands how to take these two meds with the appropriate BP.  She has a scale but it is currently broken and she says she will work on getting another one.  Joellen Jersey will do a home visit with her next week. She has a full time job Mon-Fri 5:00 p.m. to 1:00 a.m.  Prefers morning appointments.  Home visit complete.  Patient Care Team: Patient, No Pcp Per as PCP - General (General Practice)  Patient Active Problem List   Diagnosis Date Noted   Dilated cardiomyopathy ([place]) 06/04/2015   Symptomatic anemia 06/04/2015   Chest pain 06/04/2015   Abnormal uterine bleeding (AUB) 06/04/2015   Metrorrhagia 06/04/2015   Abdominal pain    Hypokalemia    Menorrhagia    VT (ventricular tachycardia) ([place]) 07/22/2014   SVT (supraventricular tachycardia) ([place]) 03/30/2014   HTN (hypertension) 03/30/2014   Chest pressure- unspecified 02/03/2014   Atrial tachycardia ([place]) 10/01/2012   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   Hypertensive heart disease with CHF (congestive heart failure) ([place])  08/04/2012   Nonischemic dilated cardiomyopathy ([place]) 07/29/2012   Acute on chronic systolic heart failure ([place]) 05/05/2012   Dyspnea 03/29/2012   Anemia 03/29/2012    Current Outpatient Medications:    amiodarone (PACERONE) 200 MG tablet, Take 1 tablet (200 mg total) by mouth daily., Disp: 30 tablet, Rfl: 6   amoxicillin-clavulanate (AUGMENTIN) 875-125 MG tablet, Take 1 tablet by mouth every 12 (twelve) hours. (Patient not taking: Reported on 07/10/2022), Disp: 14 tablet, Rfl: 0   carvedilol (COREG) 3.125 MG tablet, Take 1 tablet (3.125 mg total) by mouth 2 (two) times daily with a meal. (Patient not taking: Reported on 07/08/2022), Disp: 60 tablet, Rfl: 3   dapagliflozin propanediol (FARXIGA) 10 MG TABS tablet, Take 1 tablet (10 mg total) by mouth daily. Needs appt for future refills, Disp: 30 tablet, Rfl: 6   ferrous sulfate 325 (65 FE) MG tablet, Take 325 mg by mouth daily with breakfast., Disp: , Rfl:    furosemide (LASIX) 40 MG tablet, Take 2 tablets (80 mg total) by mouth 2 (two) times daily. please call [phone] to schedule follow up, Disp: 120 tablet, Rfl: 3   HYDROcodone bit-homatropine (HYCODAN) 5-1.5 MG/5ML syrup, Take 5 mLs by mouth every 6 (six) hours as needed for cough., Disp: 120 mL, Rfl: 0   Multiple Vitamin (MULTIVITAMIN WITH MINERALS) TABS, Take 1 tablet by mouth daily., Disp: , Rfl:    potassium chloride SA (KLOR-CON M)  20 MEQ tablet, Take 2 tablets (40 mEq total) by mouth daily., Disp: 60 tablet, Rfl: 3   spironolactone (ALDACTONE) 25 MG tablet, Take 0.5 tablets (12.5 mg total) by mouth daily. TAKE 1 TABLET(25 MG) BY MOUTH DAILY. Hold if systolic blood pressure less than 100., Disp: 30 tablet, Rfl: 1 No Known Allergies    Social History   Socioeconomic History   Marital status: Single    Spouse name: Not on file   Number of children: 3   Years of education: Not on file   Highest education level: Not on file  Occupational History   Occupation: receptionist     Employer: [employer]   Occupation: office cleaning  Tobacco Use   Smoking status: Never   Smokeless tobacco: Never  Vaping Use   Vaping Use: Never used  Substance and Sexual Activity   Alcohol use: Yes    Alcohol/week: 1.0 standard drink of alcohol    Types: 1 Glasses of wine per week    Comment: daily   Drug use: No   Sexual activity: Yes    Birth control/protection: Condom  Other Topics Concern   Not on file  Social History Narrative   Not on file   Social Determinants of Health   Financial Resource Strain: High Risk (07/02/2022)   Overall Financial Resource Strain (CARDIA)    Difficulty of Paying Living Expenses: Hard  Food Insecurity: No Food Insecurity (07/02/2022)   Hunger Vital Sign    Worried About Running Out of Food in the Last Year: Never true    Ran Out of Food in the Last Year: Never true  Transportation Needs: No Transportation Needs (07/02/2022)   PRAPARE - Hydrologist (Medical): No    Lack of Transportation (Non-Medical): No  Physical Activity: Not on file  Stress: Not on file  Social Connections: Not on file  Intimate Partner Violence: Not on file    Physical Exam      Future Appointments  Date Time Provider [place]  08/08/2022 10:00 AM [facility] PA/NP [facility] None  09/30/2022  8:20 AM [facility] DEVICE REMOTES [facility]  12/30/2022  8:20 AM [facility] DEVICE REMOTES [facility]  03/31/2023  8:20 AM [facility] DEVICE REMOTES [facility]       Renee Ramus, [place] Paramedic  07/10/22

## 2022-07-10 NOTE — Telephone Encounter (Signed)
Stop carvedilol for now. Would continue on spiro 12.5 mg daily. Call if BP averaging < 95 systolic.  Can take spiro today if repeat BP improved

## 2022-07-11 ENCOUNTER — Telehealth (HOSPITAL_COMMUNITY): Payer: Self-pay | Admitting: Emergency Medicine

## 2022-07-11 ENCOUNTER — Telehealth (HOSPITAL_COMMUNITY): Payer: Self-pay | Admitting: Licensed Clinical Social Worker

## 2022-07-11 NOTE — Telephone Encounter (Signed)
Called and spoke with Sydney Shaffer regarding her blood pressure today.  She advised today's BP is 123/81.  Advised her that she could add back the carvedilol and 1/2 tab of Arlyce Harman to her pill box.  She states she feels comfortable doing this.  Did advise her to keep an eye on her pressure if it drops below 092 systolic.

## 2022-07-11 NOTE — Telephone Encounter (Signed)
CSW called pt to follow up regarding progress with CAFA application and confirm that she is ineligible to received insurance through her work.  Unable to reach- left VM requesting return call  [name], [facility] Desk#: [phone] Cell#: [phone]

## 2022-07-11 NOTE — Telephone Encounter (Signed)
Text [place] to see how she was feeling and asked she check her BP this morning as she did not take her carvedilol or spironolactone due to BP 90/60.   She may still be in bed since she works night shift. But ask her to reach out when she gets up.

## 2022-07-14 ENCOUNTER — Telehealth (HOSPITAL_COMMUNITY): Payer: Self-pay

## 2022-07-15 NOTE — Telephone Encounter (Signed)
Contacted pt regarding home visit this week. She did not answer, I did LVM for her to return my call.  Will try again.    [name], [place] 07/15/2022

## 2022-07-16 ENCOUNTER — Other Ambulatory Visit: Payer: Self-pay

## 2022-07-16 MED ORDER — SPIRONOLACTONE 25 MG PO TABS: 12.5000 mg | ORAL_TABLET | Freq: Every day | ORAL | 3 refills | Status: DC

## 2022-07-17 ENCOUNTER — Other Ambulatory Visit: Payer: Self-pay

## 2022-07-17 MED ORDER — AMIODARONE HCL 200 MG PO TABS: 200.0000 mg | ORAL_TABLET | Freq: Every day | ORAL | 6 refills | Status: DC

## 2022-07-17 NOTE — Telephone Encounter (Signed)
This is a CHF pt 

## 2022-07-23 ENCOUNTER — Telehealth (HOSPITAL_COMMUNITY): Payer: Self-pay | Admitting: Licensed Clinical Social Worker

## 2022-07-23 NOTE — Telephone Encounter (Signed)
HF Paramedicine Team Based Care Meeting  HF MD- NA  HF NP - [place] NP-C   [place]  Sydney Shaffer   Eligible for discharge? Relatively new referral- only been seen once- has been hard to contact since- will continue to attempt contact and consider DC soon if unable to reach.  [name], LCSW Clinical Social Worker Advanced Heart Failure Clinic Desk#: [phone] Cell#: [phone]

## 2022-07-28 ENCOUNTER — Telehealth (HOSPITAL_COMMUNITY): Payer: Self-pay

## 2022-07-28 NOTE — Progress Notes (Signed)
Remote ICD transmission.   

## 2022-07-28 NOTE — Telephone Encounter (Signed)
Attempted to reach pt for home visit-   No answer--I did LVM for return call.    [name], [place] 07/28/2022

## 2022-07-31 ENCOUNTER — Telehealth (HOSPITAL_COMMUNITY): Payer: Self-pay

## 2022-07-31 ENCOUNTER — Other Ambulatory Visit (HOSPITAL_COMMUNITY): Payer: Self-pay

## 2022-07-31 NOTE — Progress Notes (Addendum)
Paramedicine Encounter    Patient ID: Sydney Shaffer, female    DOB: [birth year], 56 y.o.   MRN: [id number]  Pt reached out for a visit this week.  She did not return my call last week, she hasnt been seen in a few wks.  She reports she feels like she has fluid on her-she doesn't feel like the lasix is working as well. Urine output seems to be decreased.   She does feel more sob. She reports some dizziness at times but has improved.  She has been checking her b/p at home prior to taking the spiro to see if her b/p is over 100.  She reports b/p's around the 867 systolic.  Her weight is up 6lbs from yesterday.  She has reported a productive cough since 7/4. She reports phlegm is yellow colored.  She admits to some food/fluid indiscretion-drinking 7 bottles of water yesterday and also ate at [employer] yesterday.  So we talked about that.  Called in to clinic for further--she does have a lot of swelling to her legs.  Also advised her to get compression stockings as well.   Per phliicia @ clinic she is to Take another '40mg'$  lasix for 2 days. She did take the extra '40mg'$  with me here and placed tomor dose in pill box.   Send in medtronic report.-she said it would get done.   Meds verified and pill box refilled.   BP 116/78   Pulse 100   Resp 16   Wt 168 lb (76.2 kg)   SpO2 100%   BMI 26.31 kg/m  Weight yesterday-162 Last visit weight-168  Patient Care Team: Patient, No Pcp Per as PCP - General (General Practice)  Patient Active Problem List   Diagnosis Date Noted   Dilated cardiomyopathy ([place]) 06/04/2015   Symptomatic anemia 06/04/2015   Chest pain 06/04/2015   Abnormal uterine bleeding (AUB) 06/04/2015   Metrorrhagia 06/04/2015   Abdominal pain    Hypokalemia    Menorrhagia    VT (ventricular tachycardia) ([place]) 07/22/2014   SVT (supraventricular tachycardia) ([place]) 03/30/2014   HTN (hypertension) 03/30/2014   Chest pressure- unspecified 02/03/2014   Atrial tachycardia ([place])  10/01/2012   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   Hypertensive heart disease with CHF (congestive heart failure) ([place]) 08/04/2012   Nonischemic dilated cardiomyopathy ([place]) 07/29/2012   Acute on chronic systolic heart failure ([place]) 05/05/2012   Dyspnea 03/29/2012   Anemia 03/29/2012    Current Outpatient Medications:    amiodarone (PACERONE) 200 MG tablet, Take 1 tablet (200 mg total) by mouth daily., Disp: 30 tablet, Rfl: 6   carvedilol (COREG) 3.125 MG tablet, Take 1 tablet (3.125 mg total) by mouth 2 (two) times daily with a meal., Disp: 60 tablet, Rfl: 3   dapagliflozin propanediol (FARXIGA) 10 MG TABS tablet, Take 1 tablet (10 mg total) by mouth daily. Needs appt for future refills, Disp: 30 tablet, Rfl: 6   ferrous sulfate 325 (65 FE) MG tablet, Take 325 mg by mouth daily with breakfast., Disp: , Rfl:    furosemide (LASIX) 40 MG tablet, Take 2 tablets (80 mg total) by mouth 2 (two) times daily. please call [phone] to schedule follow up, Disp: 120 tablet, Rfl: 3   Multiple Vitamin (MULTIVITAMIN WITH MINERALS) TABS, Take 1 tablet by mouth daily., Disp: , Rfl:    potassium chloride SA (KLOR-CON M) 20 MEQ tablet, Take 2 tablets (40 mEq total) by mouth daily., Disp: 60 tablet, Rfl: 3   spironolactone (ALDACTONE) 25 MG tablet, Take  0.5 tablets (12.5 mg total) by mouth daily. Hold if systolic blood pressure less than 100., Disp: 15 tablet, Rfl: 3   amoxicillin-clavulanate (AUGMENTIN) 875-125 MG tablet, Take 1 tablet by mouth every 12 (twelve) hours. (Patient not taking: Reported on 07/10/2022), Disp: 14 tablet, Rfl: 0   HYDROcodone bit-homatropine (HYCODAN) 5-1.5 MG/5ML syrup, Take 5 mLs by mouth every 6 (six) hours as needed for cough. (Patient not taking: Reported on 07/10/2022), Disp: 120 mL, Rfl: 0 No Known Allergies    Social History   Socioeconomic History   Marital status: Single    Spouse name: Not on file   Number of children: 3   Years of education: Not on file    Highest education level: Not on file  Occupational History   Occupation: receptionist    Employer: [employer]   Occupation: office cleaning  Tobacco Use   Smoking status: Never   Smokeless tobacco: Never  Vaping Use   Vaping Use: Never used  Substance and Sexual Activity   Alcohol use: Yes    Alcohol/week: 1.0 standard drink of alcohol    Types: 1 Glasses of wine per week    Comment: daily   Drug use: No   Sexual activity: Yes    Birth control/protection: Condom  Other Topics Concern   Not on file  Social History Narrative   Not on file   Social Determinants of Health   Financial Resource Strain: High Risk (07/02/2022)   Overall Financial Resource Strain (CARDIA)    Difficulty of Paying Living Expenses: Hard  Food Insecurity: No Food Insecurity (07/02/2022)   Hunger Vital Sign    Worried About Running Out of Food in the Last Year: Never true    Ran Out of Food in the Last Year: Never true  Transportation Needs: No Transportation Needs (07/02/2022)   PRAPARE - Hydrologist (Medical): No    Lack of Transportation (Non-Medical): No  Physical Activity: Not on file  Stress: Not on file  Social Connections: Not on file  Intimate Partner Violence: Not on file    Physical Exam      Future Appointments  Date Time Provider [place]  08/08/2022 10:00 AM [facility] PA/NP [facility] None  09/16/2022  3:30 PM Deboraha Sprang, MD [facility]  09/30/2022  8:20 AM [facility] DEVICE REMOTES [facility]  12/30/2022  8:20 AM [facility] DEVICE REMOTES [facility]  03/31/2023  8:20 AM [facility] DEVICE REMOTES [facility]       [name], EMT-Paramedic [phone] [facility] Paramedic  07/31/22

## 2022-07-31 NOTE — Telephone Encounter (Signed)
Katie, Paramedic called to report that patient has had an increase in weight. Yesterday(07/30/22) patient was 162, today(07/31/22) patient is 168. Per Darrick Grinder, NP have patient take an extra '40mg'$  of lasix for 2 days, send over her Medtronic report and make sure she keeps her pending appointment next week. Katie advised and verbalized understanding.

## 2022-08-07 ENCOUNTER — Telehealth (HOSPITAL_COMMUNITY): Payer: Self-pay

## 2022-08-07 NOTE — Telephone Encounter (Signed)
Pt contacted me today to cancel our appointment. She reports there is a family emergency and she cannot be avail today for our home visit.  Will try to sch her next week.   She has clinic appoint tomor and looks like someone from clinic did reach her to confirm appointment.   [name], [place] 08/07/2022

## 2022-08-07 NOTE — Telephone Encounter (Signed)
Called to confirm/remind patient of their appointment at the [facility] on 08/08/22.   Patient reminded to bring all medications and/or complete list.  Confirmed patient has transportation. Gave directions, instructed to utilize [place] parking.  Confirmed appointment prior to ending call.

## 2022-08-08 ENCOUNTER — Other Ambulatory Visit (HOSPITAL_COMMUNITY): Payer: Self-pay | Admitting: Family Medicine

## 2022-08-08 ENCOUNTER — Other Ambulatory Visit (HOSPITAL_COMMUNITY): Payer: Self-pay

## 2022-08-08 ENCOUNTER — Encounter (HOSPITAL_COMMUNITY): Payer: Self-pay

## 2022-08-08 ENCOUNTER — Ambulatory Visit (HOSPITAL_COMMUNITY)
Admission: RE | Admit: 2022-08-08 | Discharge: 2022-08-08 | Disposition: A | Discharge status: Home / Self Care | Payer: Self-pay | Source: Ambulatory Visit | Attending: Family Medicine | Admitting: Family Medicine

## 2022-08-08 VITALS — BP 114/84 | HR 99 | Wt 194.4 lb

## 2022-08-08 DIAGNOSIS — I110 Hypertensive heart disease with heart failure: Secondary | ICD-10-CM | POA: Insufficient documentation

## 2022-08-08 DIAGNOSIS — Z139 Encounter for screening, unspecified: Secondary | ICD-10-CM

## 2022-08-08 DIAGNOSIS — R058 Other specified cough: Secondary | ICD-10-CM | POA: Insufficient documentation

## 2022-08-08 DIAGNOSIS — E876 Hypokalemia: Secondary | ICD-10-CM

## 2022-08-08 DIAGNOSIS — I472 Ventricular tachycardia, unspecified: Secondary | ICD-10-CM

## 2022-08-08 DIAGNOSIS — Z79899 Other long term (current) drug therapy: Secondary | ICD-10-CM | POA: Insufficient documentation

## 2022-08-08 DIAGNOSIS — I420 Dilated cardiomyopathy: Secondary | ICD-10-CM | POA: Insufficient documentation

## 2022-08-08 DIAGNOSIS — Z7901 Long term (current) use of anticoagulants: Secondary | ICD-10-CM | POA: Insufficient documentation

## 2022-08-08 DIAGNOSIS — I5023 Acute on chronic systolic (congestive) heart failure: Secondary | ICD-10-CM | POA: Insufficient documentation

## 2022-08-08 DIAGNOSIS — I471 Supraventricular tachycardia: Secondary | ICD-10-CM | POA: Insufficient documentation

## 2022-08-08 DIAGNOSIS — Z7984 Long term (current) use of oral hypoglycemic drugs: Secondary | ICD-10-CM | POA: Insufficient documentation

## 2022-08-08 LAB — BASIC METABOLIC PANEL
Anion gap: 9 (ref 5–15)
BUN: 12 mg/dL (ref 6–20)
CO2: 29 mmol/L (ref 22–32)
Calcium: 9 mg/dL (ref 8.9–10.3)
Chloride: 100 mmol/L (ref 98–111)
Creatinine, Ser: 0.76 mg/dL (ref 0.44–1.00)
GFR, Estimated: >60 mL/min (ref >60)
Glucose, Bld: 129 mg/dL — ABNORMAL HIGH (ref 70–99)
Potassium: 2.8 mmol/L — ABNORMAL LOW (ref 3.5–5.1)
Sodium: 138 mmol/L (ref 135–145)

## 2022-08-08 LAB — BRAIN NATRIURETIC PEPTIDE: B Natriuretic Peptide: 1059.6 pg/mL — ABNORMAL HIGH (ref 0.0–100.0)

## 2022-08-08 MED ORDER — TORSEMIDE 20 MG PO TABS
40.0000 mg | ORAL_TABLET | Freq: Two times a day (BID) | ORAL | 3 refills | Status: DC
  Filled 2022-08-08: qty 120, 30d supply, fill #0

## 2022-08-08 MED ORDER — METOLAZONE 2.5 MG PO TABS
2.5000 mg | ORAL_TABLET | Freq: Once | ORAL | 0 refills | Status: DC
  Filled 2022-08-08: qty 1, 1d supply, fill #0

## 2022-08-08 MED ORDER — POTASSIUM CHLORIDE CRYS ER 20 MEQ PO TBCR
40.0000 meq | EXTENDED_RELEASE_TABLET | Freq: Two times a day (BID) | ORAL | 3 refills | Status: DC
  Filled 2022-08-08: qty 60, 15d supply, fill #0

## 2022-08-08 MED ORDER — SPIRONOLACTONE 25 MG PO TABS
25.0000 mg | ORAL_TABLET | Freq: Every day | ORAL | 3 refills | Status: DC
  Filled 2022-08-08: qty 10, 10d supply, fill #0
  Filled 2022-08-08: qty 5, 5d supply, fill #0
  Filled 2022-10-01: qty 15, 15d supply, fill #0
  Filled 2022-12-04: qty 30, 30d supply, fill #1

## 2022-08-08 NOTE — Patient Instructions (Addendum)
Labs done today. We will contact you only if your labs are abnormal.  STOP taking Lasix  START Torsemide '40mg'$  (2 tablets) by mouth 2 times daily.   START Metolazone 2.'5mg'$  (1 tablet) by mouth today only. Take an extra 2 tablets of Potassium when you take this  No other medication changes were made. Please continue all current medications as prescribed.  Your physician recommends that you schedule a follow-up appointment in: 3 weeks with our NP/PA Clinic and in 3 months with Dr. Haroldine Laws. Both appointments here in our office.   If you have any questions or concerns before your next appointment please send [place] a message through [place] or call our office at [phone].    TO LEAVE A MESSAGE FOR THE NURSE SELECT OPTION 2, PLEASE LEAVE A MESSAGE INCLUDING: YOUR NAME DATE OF BIRTH CALL BACK NUMBER REASON FOR CALL**this is important as we prioritize the call backs  YOU WILL RECEIVE A CALL BACK THE SAME DAY AS LONG AS YOU CALL BEFORE 4:00 PM   Do the following things EVERYDAY: Weigh yourself in the morning before breakfast. Write it down and keep it in a log. Take your medicines as prescribed Eat low salt foods--Limit salt (sodium) to 2000 mg per day.  Stay as active as you can everyday Limit all fluids for the day to less than 2 liters   At the [facility], you and your health needs are our priority. As part of our continuing mission to provide you with exceptional heart care, we have created designated Provider Care Teams. These Care Teams include your primary Cardiologist (physician) and Advanced Practice Providers (APPs- Physician Assistants and Nurse Practitioners) who all work together to provide you with the care you need, when you need it.   You may see any of the following providers on your designated Care Team at your next follow up: Dr Glori Bickers Dr Haynes Kerns, NP Lyda Jester, [place] Audry Riles, PharmD   Please be sure to bring in  all your medications bottles to every appointment.

## 2022-08-08 NOTE — Progress Notes (Signed)
Advanced Heart Failure Clinic Note   PCP: Linward Natal, PA-C ([facility]) EP: Dr Caryl Comes Medtronic ICD HF Cardiologist: Dr. Haroldine Laws  HPI: Sydney Shaffer is a 56 y.o.female with a history of chronic systolic HF due to nonischemic cardiomyopathy s/p Medtronic ICD, HTN, and SVT s/p 01/08/14 ablation.  Cath 4/13 normal coronaries.  Lost to follow up until 5/19. Echo 6/19 EF 30-35% mild MR. Saw Dr. Caryl Comes in 9/20 and had some recurrent brief. SVT. No VT. S/p ICD gen change 12/20.  Reestablished with HF clinic in April 2023. Had been out of some medications.   Echo 04/23: EF 25-30%, LV severely dilated, grade III DD, RV okay, RVSP 49 mmHg, moderate MR, mild to moderate TR   Seen in Urgent Care twice in July with dyspnea and cough and treated for PNA and diuretics increased temporarily. Seen in ED 07/01/22 with recurrent symptoms and was volume overloaded. HF consulted. Diuresed with 80 mg lasix IV BID. Had been out of diuretics for several days and not taking all of her medications. She was given prescriptions for lasix and [name] and referred for paramedicine.  Amiodarone increased d/t episodes of VT (monitor only) and runs SVT and AF.   Today she returns for HF follow up. Overall feeling fair. Continues with productive cough. She is SOB walking short distances on flat ground. Legs are swelling more.  Denies palpitations, CP, dizziness, or PND/Orthopnea. Appetite ok. No fever or chills. Weight at home 197 pounds. Taking all medications. Followed by paramedicine.   Cardiac Studies:  - Echo (4/23): EF 25-30% - Echo (10/17): EF 25-30%, Grade 1 DD - Echo (6/16): EF 30-35% mild MR - Echo (2/15): EF 20-25%  - Echo (7/13): EF 20-25%   - R/LHC (10/17): normal cors  RA = 3 RV = 23/4 PA = 22/6 (14) PCW = 8 Ao = 123/69 (91) LV = 106/6 Fick cardiac output/index = 7.4/3.5 PVR = < 1.0 WU SVR = 958  FA sat = 88% PA sat = 63%, 62%   - CPX (5/18)  FVC 1.99 (61%)      FEV1 1.59 (61%)         FEV1/FVC 80 (99%)        MVV 58  (57%)       Resting HR: 81 Peak HR: 121   (72% age predicted max HR) BP rest: 108/68 BP peak: 144/80 Peak VO2: 12.3 (61% predicted peak VO2) VE/VCO2 slope:  25 OUES: 2.13 Peak RER: 0.98 Ventilatory Threshold: 11.2 (55% predicted or measured peak VO2) VE/MVV:  59% PETCO2 at peak:  40 O2pulse:  10   (91% predicted O2pulse)  - CPX (6/15): FVC 1.69 (51%)  FEV1 1.24 (47%)  FEV1/FVC 74%  Peak VO2: 12.6 (57.8% predicted peak VO2) VE/VCO2 slope: 28.6 OUES: 1.60 Peak RER: 1.01 Peak VO2 adjusted to the patient's ideal body weight of 150 lb (67.8 kg) the peak VO2 is 16.5 ml/kg (ibw)/min (62% of the ibw-adjusted predicted).  SH: She is not a smoker. She does not drink alcohol. Lives alone. She has 3 grown children   FH: Mom breast cancer. Father cancer.   ROS: All systems negative except as listed in HPI, PMH and Problem List.  Past Medical History:  Diagnosis Date   Anemia    felt to be due to heavy menstrual flow   Atrial tachycardia (HCC)    s/p ablation   AVNRT (AV nodal re-entry tachycardia) ([place])    s/p ablation   CHF (congestive heart failure) ([place])  Dx 03/2012 - dilated cardiomyopathy with EF 20-25% by echo (abnl nuc but normal coronaries 04/02/12 per cath.    Chronic systolic heart failure (HCC)    Dysrhythmia    Bradycardia   Hypertension    Hypertensive heart disease with CHF ([place])    ICD (implantable cardiac defibrillator) in place 08/13/2012   Current Outpatient Medications  Medication Sig Dispense Refill   amiodarone (PACERONE) 200 MG tablet Take 1 tablet (200 mg total) by mouth daily. 30 tablet 6   carvedilol (COREG) 3.125 MG tablet Take 1 tablet (3.125 mg total) by mouth 2 (two) times daily with a meal. 60 tablet 3   dapagliflozin propanediol (FARXIGA) 10 MG TABS tablet Take 1 tablet (10 mg total) by mouth daily. Needs appt for future refills 30 tablet 6   ferrous sulfate 325 (65 FE) MG tablet Take 325 mg by mouth daily with  breakfast.     furosemide (LASIX) 40 MG tablet Take 2 tablets (80 mg total) by mouth 2 (two) times daily. please call [phone] to schedule follow up 120 tablet 3   Multiple Vitamin (MULTIVITAMIN WITH MINERALS) TABS Take 1 tablet by mouth daily.     potassium chloride SA (KLOR-CON M) 20 MEQ tablet Take 2 tablets (40 mEq total) by mouth daily. 60 tablet 3   spironolactone (ALDACTONE) 25 MG tablet Take 0.5 tablets (12.5 mg total) by mouth daily. Hold if systolic blood pressure less than 100. 15 tablet 3   No current facility-administered medications for this encounter.   BP 114/84   Pulse 99   Wt 88.2 kg (194 lb 6.4 oz)   SpO2 92%   BMI 30.45 kg/m   Wt Readings from Last 3 Encounters:  08/08/22 88.2 kg (194 lb 6.4 oz)  07/31/22 76.2 kg (168 lb)  07/08/22 83.9 kg (185 lb)    PHYSICAL EXAM: General:  NAD. No resp difficulty, fatigued appearing HEENT: Normal Neck: Supple. JVP to jaw Carotids 2+ bilat; no bruits. No lymphadenopathy or thryomegaly appreciated. Cor: PMI nondisplaced. Regular rate & rhythm. No rubs, gallops or murmurs. Lungs: Basilar crackles Abdomen: Soft, nontender, nondistended. No hepatosplenomegaly. No bruits or masses. Good bowel sounds. Extremities: No cyanosis, clubbing, rash, 1-2+ pre-tibial BLE edema Neuro: Alert & oriented x 3, cranial nerves grossly intact. Moves all 4 extremities w/o difficulty. Affect pleasant.  Device interrogation: OptiVol up, thoracic impedence down, 1 hr/day activity, no VT (personally reviewed)  ASSESSMENT & PLAN: 1. Acute on Chronic Systolic Heart Failure:  - Normal cors by cath 2013. Nonischemic cardiomyopathy s/p Medtronic ICD.  - Echo (6/16): EF 30-35%. - Echo (10/17): EF 25-30%.   - Echo (6/19): EF 30-35%. - Echo (4/23): EF 25-30% - ICD generator change 11/14/19 - Worse NYHA III. Volume up on exam and by OptiVol. Reports compliance with meds. - Take metolazone 2.5 mg + extra 40 KCL today. - Stop Lasix. - Start torsemide 40  mg bid. - No BP room for ARNi/ARB. - Continue spiro 12.5 mg daily - Continue Coreg 3.125 mg bid. - Continue Farxiga 10 mg daily.            - BMET/BNP today, repeat  BMET at follow up. - Will ask device RN to send transmission in 1 week.                                                                                            -  2. SVT and VT  - s/p atrial tachycardia ablation in 01/2014.   - Followed by Dr. Caryl Comes, missed last appointment. Needs to reschedule. - Device check 7/25: 6 monitored VT, runs SVT. Amiodarone increased to 200 mg daily. - TSH and LFTs OK 4/23  3. SDOH - Compliance has improved with Paramedicine program. Appreciate assistance. - Uninsured. HFSW helping.  Follow up in 3 weeks with APP to re-check fluid and 3 months with Dr. Melynda Keller, FNP-BC 08/08/2022

## 2022-08-12 ENCOUNTER — Ambulatory Visit (HOSPITAL_COMMUNITY)
Admission: RE | Admit: 2022-08-12 | Discharge: 2022-08-12 | Disposition: A | Discharge status: Home / Self Care | Payer: Self-pay | Source: Ambulatory Visit | Attending: Cardiology | Admitting: Cardiology

## 2022-08-12 ENCOUNTER — Telehealth (HOSPITAL_COMMUNITY): Payer: Self-pay

## 2022-08-12 DIAGNOSIS — E876 Hypokalemia: Secondary | ICD-10-CM | POA: Insufficient documentation

## 2022-08-12 LAB — BASIC METABOLIC PANEL
Anion gap: 12 (ref 5–15)
BUN: 26 mg/dL — ABNORMAL HIGH (ref 6–20)
CO2: 29 mmol/L (ref 22–32)
Calcium: 9.7 mg/dL (ref 8.9–10.3)
Chloride: 98 mmol/L (ref 98–111)
Creatinine, Ser: 1.04 mg/dL — ABNORMAL HIGH (ref 0.44–1.00)
GFR, Estimated: >60 mL/min (ref >60)
Glucose, Bld: 123 mg/dL — ABNORMAL HIGH (ref 70–99)
Potassium: 3.5 mmol/L (ref 3.5–5.1)
Sodium: 139 mmol/L (ref 135–145)

## 2022-08-12 NOTE — Telephone Encounter (Signed)
-----   Message from Rafael Bihari, [place] sent at 08/12/2022  1:57 PM EDT ----- Potassium improved. Please repeat BMET in 7-10 days to ensure kidney function stable on higher dose of spiro

## 2022-08-12 NOTE — Telephone Encounter (Signed)
Patient's lab has been placed and her appointment scheduled. Patient also requested for a reminder voice message was also left on her answering machine.  Pt aware, agreeable, and verbalized understanding

## 2022-08-13 ENCOUNTER — Other Ambulatory Visit (HOSPITAL_COMMUNITY): Payer: Self-pay

## 2022-08-13 NOTE — Progress Notes (Signed)
Paramedicine Encounter    Patient ID: Sydney Shaffer, female    DOB: July 27, 1966, 56 y.o.   MRN: [id number]  Pt cancelled our appoint last week due to family emergency.  She was seen in clinic and her lasix was changed to torsemide.  She was given metolazone last week as a one time dose only.   She was suppose to send transmission this week on her device but her transmitter is not working. Dr Caryl Comes office is aware and she called the number on the back and they will send over a new home device  for her.  That was done about a week ago.  She reports she does feel better with the torsemide use. Not 100% yet but feels better.  She has not taken meds yet this morning, she took b/p this morning and it was 143/114 on her machine.  When I checked it, it was not close that high. It was below 100 so no spiro or carvedilol this morning.  But then she said the spiro she takes at bedtime.   I advised her those auto home cuffs are sensitive and some tips to help get a more accurate reading.   She does have edema to her legs, she said its not as bad as last week, but still there.   She had began to fill her pill box. She had the lasix in there but said she was not taking it. She had not been taking the 82mq K BID.  She did not increase the spiro to a full tablet.  That also will be started today.   Her weight is the same as she reported last week-but she feels better.   Meds verified and pill box checked and added the things that were missing.   BP 92/70   Pulse 98   Resp 16   Wt 190 lb (86.2 kg)   SpO2 97%   BMI 29.76 kg/m  Weight yesterday-190 Last visit weight-194 @ clinic   Patient Care Team: Patient, No Pcp Per as PCP - General (General Practice)  Patient Active Problem List   Diagnosis Date Noted  . Dilated cardiomyopathy ([place] 06/04/2015  . Symptomatic anemia 06/04/2015  . Chest pain 06/04/2015  . Abnormal uterine bleeding (AUB) 06/04/2015  . Metrorrhagia 06/04/2015  . Abdominal  pain   . Hypokalemia   . Menorrhagia   . VT (ventricular tachycardia) ([facility] 07/22/2014  . SVT (supraventricular tachycardia) ([place] 03/30/2014  . HTN (hypertension) 03/30/2014  . Chest pressure- unspecified 02/03/2014  . Atrial tachycardia ([facility] 10/01/2012  . 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  . Hypertensive heart disease with CHF (congestive heart failure) ([facility] 08/04/2012  . Nonischemic dilated cardiomyopathy ([facility] 07/29/2012  . Acute on chronic systolic heart failure ([facility] [place] 05/05/2012  . Dyspnea 03/29/2012  . Anemia 03/29/2012    Current Outpatient Medications:  .  amiodarone (PACERONE) 200 MG tablet, Take 1 tablet (200 mg total) by mouth daily., Disp: 30 tablet, Rfl: 6 .  carvedilol (COREG) 3.125 MG tablet, Take 1 tablet (3.125 mg total) by mouth 2 (two) times daily with a meal., Disp: 60 tablet, Rfl: 3 .  dapagliflozin propanediol (FARXIGA) 10 MG TABS tablet, Take 1 tablet (10 mg total) by mouth daily. Needs appt for future refills, Disp: 30 tablet, Rfl: 6 .  ferrous sulfate 325 (65 FE) MG tablet, Take 325 mg by mouth daily with breakfast., Disp: , Rfl:  .  Multiple Vitamin (MULTIVITAMIN WITH MINERALS) TABS, Take 1 tablet by mouth daily., Disp: , Rfl:  .  potassium chloride SA (KLOR-CON M) 20 MEQ tablet, Take 2 tablets (40 mEq total) by mouth 2 (two) times daily., Disp: 60 tablet, Rfl: 3 .  spironolactone (ALDACTONE) 25 MG tablet, Take 1 tablet (25 mg total) by mouth daily. Hold if systolic blood pressure less than 100., Disp: 15 tablet, Rfl: 3 .  torsemide (DEMADEX) 20 MG tablet, Take 2 tablets (40 mg total) by mouth 2 (two) times daily., Disp: 360 tablet, Rfl: 3 .  metolazone (ZAROXOLYN) 2.5 MG tablet, Take 1 tablet (2.5 mg total) by mouth once for 1 dose., Disp: 1 tablet, Rfl: 0 No Known Allergies    Social History   Socioeconomic History  . Marital status: Single    Spouse name: Not on file  . Number of children: 3  . Years of education: Not on file  . Highest  education level: Not on file  Occupational History  . Occupation: Retail buyer: [employer]  . Occupation: office cleaning  Tobacco Use  . Smoking status: Never  . Smokeless tobacco: Never  Vaping Use  . Vaping Use: Never used  Substance and Sexual Activity  . Alcohol use: Yes    Alcohol/week: 1.0 standard drink of alcohol    Types: 1 Glasses of wine per week    Comment: daily  . Drug use: No  . Sexual activity: Yes    Birth control/protection: Condom  Other Topics Concern  . Not on file  Social History Narrative  . Not on file   Social Determinants of Health   Financial Resource Strain: High Risk (07/02/2022)   Overall Financial Resource Strain (CARDIA)   . Difficulty of Paying Living Expenses: Hard  Food Insecurity: No Food Insecurity (07/02/2022)   Hunger Vital Sign   . Worried About Charity fundraiser in the Last Year: Never true   . Ran Out of Food in the Last Year: Never true  Transportation Needs: No Transportation Needs (07/02/2022)   PRAPARE - Transportation   . Lack of Transportation (Medical): No   . Lack of Transportation (Non-Medical): No  Physical Activity: Not on file  Stress: Not on file  Social Connections: Not on file  Intimate Partner Violence: Not on file    Physical Exam      Future Appointments  Date Time Provider [place]  08/18/2022  7:00 AM [facility] DEVICE REMOTES [facility]  08/19/2022  9:00 AM [facility] LAB [facility] None  08/28/2022 10:30 AM [facility] PA/NP [facility] None  09/16/2022  3:30 PM Deboraha Sprang, MD [facility]  09/30/2022  8:20 AM [facility] DEVICE REMOTES [facility]  11/12/2022 10:40 AM Bensimhon, Shaune Pascal, MD [facility] None  12/30/2022  8:20 AM [facility] DEVICE REMOTES [facility]  03/31/2023  8:20 AM [facility] DEVICE REMOTES [facility]       [name], EMT-Paramedic [place] [facility]ic  08/13/22

## 2022-08-15 ENCOUNTER — Encounter: Payer: Self-pay | Admitting: Cardiology

## 2022-08-18 ENCOUNTER — Telehealth (HOSPITAL_COMMUNITY): Payer: Self-pay

## 2022-08-19 ENCOUNTER — Other Ambulatory Visit (HOSPITAL_COMMUNITY): Payer: Self-pay

## 2022-08-19 ENCOUNTER — Ambulatory Visit (HOSPITAL_COMMUNITY)
Admission: RE | Admit: 2022-08-19 | Discharge: 2022-08-19 | Disposition: A | Discharge status: Home / Self Care | Payer: Self-pay | Source: Ambulatory Visit | Attending: Internal Medicine | Admitting: Internal Medicine

## 2022-08-19 DIAGNOSIS — Z01812 Encounter for preprocedural laboratory examination: Secondary | ICD-10-CM | POA: Insufficient documentation

## 2022-08-19 DIAGNOSIS — E876 Hypokalemia: Secondary | ICD-10-CM | POA: Insufficient documentation

## 2022-08-19 LAB — BASIC METABOLIC PANEL
Anion gap: 9 (ref 5–15)
BUN: 16 mg/dL (ref 6–20)
CO2: 28 mmol/L (ref 22–32)
Calcium: 9.6 mg/dL (ref 8.9–10.3)
Chloride: 102 mmol/L (ref 98–111)
Creatinine, Ser: 1.14 mg/dL — ABNORMAL HIGH (ref 0.44–1.00)
GFR, Estimated: 56 mL/min — ABNORMAL LOW (ref >60)
Glucose, Bld: 121 mg/dL — ABNORMAL HIGH (ref 70–99)
Potassium: 3.1 mmol/L — ABNORMAL LOW (ref 3.5–5.1)
Sodium: 139 mmol/L (ref 135–145)

## 2022-08-20 ENCOUNTER — Telehealth: Payer: Self-pay

## 2022-08-20 NOTE — Telephone Encounter (Signed)
Attempted call to patient to request a remote transmission for fluid level check as requested by HF clinic following 9/1 OV.  Person answering phone stated she was not home.

## 2022-08-20 NOTE — Telephone Encounter (Signed)
Unable to reach patient for HF clinic fluid check.

## 2022-08-21 ENCOUNTER — Telehealth (HOSPITAL_COMMUNITY): Payer: Self-pay | Admitting: Cardiology

## 2022-08-21 ENCOUNTER — Other Ambulatory Visit (HOSPITAL_COMMUNITY): Payer: Self-pay

## 2022-08-21 DIAGNOSIS — I5022 Chronic systolic (congestive) heart failure: Secondary | ICD-10-CM

## 2022-08-21 MED ORDER — POTASSIUM CHLORIDE CRYS ER 20 MEQ PO TBCR
60.0000 meq | EXTENDED_RELEASE_TABLET | Freq: Two times a day (BID) | ORAL | 3 refills | Status: DC
  Filled 2022-08-21: qty 180, 30d supply, fill #0
  Filled 2022-12-04: qty 180, 30d supply, fill #1
  Filled 2023-03-02: qty 180, 30d supply, fill #2
  Filled 2023-04-22: qty 180, 30d supply, fill #3

## 2022-08-21 NOTE — Telephone Encounter (Signed)
-----   Message from Rafael Bihari, [place] sent at 08/20/2022  4:06 PM EDT ----- K is low. Please ensure she is taking 40 KCL bid along with spiro 25 mg daily.  If so, increase KCL to 60 bid.  Repeat BMET with a mag in 10 days.

## 2022-08-21 NOTE — Telephone Encounter (Signed)
Patient called.  Patient aware.  

## 2022-08-26 NOTE — Telephone Encounter (Signed)
Reached out to pt regarding home visit for this week-she said she would get back to me tomorrow b/c she has doc appointments this week.    Sydney Shaffer, [place] 08-18-2022

## 2022-08-27 ENCOUNTER — Telehealth (HOSPITAL_COMMUNITY): Payer: Self-pay

## 2022-08-27 NOTE — Telephone Encounter (Signed)
Called and left patient a voice message to confirm/remind patient of their appointment at the [facility] on 08/28/22.

## 2022-08-28 ENCOUNTER — Encounter (HOSPITAL_COMMUNITY): Payer: Self-pay

## 2022-08-29 ENCOUNTER — Telehealth (HOSPITAL_COMMUNITY): Payer: Self-pay

## 2022-08-29 NOTE — Telephone Encounter (Signed)
Called and left patient a voice message to confirm/remind patient of their appointment at the [facility] on 09/01/22.

## 2022-09-01 ENCOUNTER — Encounter (HOSPITAL_COMMUNITY): Payer: Self-pay

## 2022-09-01 ENCOUNTER — Other Ambulatory Visit (HOSPITAL_COMMUNITY): Payer: Self-pay

## 2022-09-01 ENCOUNTER — Telehealth: Payer: Self-pay

## 2022-09-01 ENCOUNTER — Ambulatory Visit (HOSPITAL_COMMUNITY)
Admission: RE | Admit: 2022-09-01 | Discharge: 2022-09-01 | Disposition: A | Discharge status: Home / Self Care | Payer: Self-pay | Source: Ambulatory Visit | Attending: Family Medicine | Admitting: Family Medicine

## 2022-09-01 VITALS — BP 98/68 | HR 106 | Wt 182.8 lb

## 2022-09-01 DIAGNOSIS — I110 Hypertensive heart disease with heart failure: Secondary | ICD-10-CM | POA: Insufficient documentation

## 2022-09-01 DIAGNOSIS — I5022 Chronic systolic (congestive) heart failure: Secondary | ICD-10-CM | POA: Insufficient documentation

## 2022-09-01 DIAGNOSIS — I471 Supraventricular tachycardia: Secondary | ICD-10-CM | POA: Insufficient documentation

## 2022-09-01 DIAGNOSIS — Z139 Encounter for screening, unspecified: Secondary | ICD-10-CM

## 2022-09-01 DIAGNOSIS — R059 Cough, unspecified: Secondary | ICD-10-CM | POA: Insufficient documentation

## 2022-09-01 DIAGNOSIS — I420 Dilated cardiomyopathy: Secondary | ICD-10-CM | POA: Insufficient documentation

## 2022-09-01 DIAGNOSIS — R0602 Shortness of breath: Secondary | ICD-10-CM | POA: Insufficient documentation

## 2022-09-01 DIAGNOSIS — Z7984 Long term (current) use of oral hypoglycemic drugs: Secondary | ICD-10-CM | POA: Insufficient documentation

## 2022-09-01 DIAGNOSIS — I472 Ventricular tachycardia, unspecified: Secondary | ICD-10-CM | POA: Insufficient documentation

## 2022-09-01 DIAGNOSIS — Z79899 Other long term (current) drug therapy: Secondary | ICD-10-CM | POA: Insufficient documentation

## 2022-09-01 LAB — BASIC METABOLIC PANEL
Anion gap: 9 (ref 5–15)
BUN: 15 mg/dL (ref 6–20)
CO2: 28 mmol/L (ref 22–32)
Calcium: 9.7 mg/dL (ref 8.9–10.3)
Chloride: 106 mmol/L (ref 98–111)
Creatinine, Ser: 1.12 mg/dL — ABNORMAL HIGH (ref 0.44–1.00)
GFR, Estimated: 57 mL/min — ABNORMAL LOW (ref >60)
Glucose, Bld: 101 mg/dL — ABNORMAL HIGH (ref 70–99)
Potassium: 3.8 mmol/L (ref 3.5–5.1)
Sodium: 143 mmol/L (ref 135–145)

## 2022-09-01 LAB — BRAIN NATRIURETIC PEPTIDE: B Natriuretic Peptide: 1164.6 pg/mL — ABNORMAL HIGH (ref 0.0–100.0)

## 2022-09-01 MED ORDER — TORSEMIDE 20 MG PO TABS
ORAL_TABLET | ORAL | 2 refills | Status: DC
  Filled 2022-09-01: qty 180, 30d supply, fill #0
  Filled 2022-12-04: qty 180, 30d supply, fill #1
  Filled 2023-02-05: qty 180, 30d supply, fill #2
  Filled 2023-03-02: qty 180, 30d supply, fill #3
  Filled 2023-04-22: qty 180, 30d supply, fill #4

## 2022-09-01 NOTE — Progress Notes (Unsigned)
Paramedicine Encounter   Patient ID: Sydney Shaffer , female,   DOB: 10-26-1966,56 y.o.,  MRN: [id number]  Pt reports not feeling well.  She reports increased sob since yesterday.   She had to cancel her appoint last week due to her running late.  Week before she never responded back with me about coming out for home visit.  The other week her potassium was increased-she said she was taking the extra one from the bottle and didn't place it in her pill box.  Her pill box does not reflect the increased potassium dosage.   She reports taking the spiro at bedtime b/c it does make her dizzy.  She reports her b/p at home are usually above 524 systolic.  Last time I was there her home auto cuff was reading crazy high numbers, she denies any more of those.   Dizziness with position changes.   She has to sleep on 4 pillows.   No meds this morning.   No insurance yet. --open enrollment is coming very soon and she is going to try to get back on the marketplace and see what her options are. She said they do not take the time to review med lists and see what her co-pays are.   She was going to PCP office in [place] but hadnt been back to them in at least in a year. She asked which [facility] offices would be good so I advised her [facility] sees pt's without insurance.  I sent Opal Sidles a message to see if they have any openings at the [facility] office.  Weights at home 182.  Torsemide 67m in AM and keep 446min PM.   She brought her pill box so those med changes reflect her pill box now.  She will have to go by pharmacy to get the potassium refill--she is missing it in for a few days. She will be able to place it where needed.   Met patient in clinic today with provider.  Weight @ clinic-183 B/P-96/68 P-108 [id number]  Med changes-increase torsemide to 8059mn AM   Sydney StacksMTKeedysville25/2023

## 2022-09-01 NOTE — Patient Instructions (Addendum)
Thank you for coming in today  Labs were done today, if any labs are abnormal the clinic will call you No news is good news  Your physician recommends that you schedule a follow-up appointment in:  Keep follow up appointment with Dr.Bensimhon   INCREASE Torsemide to 80 mg 4 tablets every morning and 40 mg 2 tablets every evening   Your physician recommends that you return for lab work in: 2 weeks BMET    Do the following things EVERYDAY: Weigh yourself in the morning before breakfast. Write it down and keep it in a log. Take your medicines as prescribed Eat low salt foods--Limit salt (sodium) to 2000 mg per day.  Stay as active as you can everyday Limit all fluids for the day to less than 2 liters  At the [facility], you and your health needs are our priority. As part of our continuing mission to provide you with exceptional heart care, we have created designated Provider Care Teams. These Care Teams include your primary Cardiologist (physician) and Advanced Practice Providers (APPs- Physician Assistants and Nurse Practitioners) who all work together to provide you with the care you need, when you need it.   You may see any of the following providers on your designated Care Team at your next follow up: Dr Glori Bickers Dr [name] Dr. Roxana Hires, NP Lyda Jester, [place] [facility], [place] Forestine Na, NP Audry Riles, PharmD   Please be sure to bring in all your medications bottles to every appointment.   If you have any questions or concerns before your next appointment please send [place] a message through [place] or call our office at [phone].    TO LEAVE A MESSAGE FOR THE NURSE SELECT OPTION 2, PLEASE LEAVE A MESSAGE INCLUDING: YOUR NAME DATE OF BIRTH CALL BACK NUMBER REASON FOR CALL**this is important as we prioritize the call backs  YOU WILL RECEIVE A CALL BACK THE SAME DAY AS LONG AS YOU CALL BEFORE 4:00  PM

## 2022-09-01 NOTE — Progress Notes (Signed)
Advanced Heart Failure Clinic Note   PCP: Linward Natal, PA-C ([facility]) EP: Dr Caryl Comes Medtronic ICD HF Cardiologist: Dr. Haroldine Laws  HPI: Sydney Shaffer is a 56 y.o.female with a history of chronic systolic HF due to nonischemic cardiomyopathy s/p Medtronic ICD, HTN, and SVT s/p 01/08/14 ablation.  Cath 4/13 normal coronaries.  Lost to follow up until 5/19. Echo 6/19 EF 30-35% mild MR. Saw Dr. Caryl Comes in 9/20 and had some recurrent brief. SVT. No VT. S/p ICD gen change 12/20.  Reestablished with HF clinic in April 2023. Had been out of some medications.   Echo 04/23: EF 25-30%, LV severely dilated, grade III DD, RV okay, RVSP 49 mmHg, moderate MR, mild to moderate TR   Seen in Urgent Care twice in July with dyspnea and cough and treated for PNA and diuretics increased temporarily. Seen in ED 07/01/22 with recurrent symptoms and was volume overloaded. HF consulted. Diuresed with 80 mg lasix IV BID. Had been out of diuretics for several days and not taking all of her medications. She was given prescriptions for lasix and [name] and referred for paramedicine.  Amiodarone increased d/t episodes of VT (monitor only) and runs SVT and AF.   She was seen for f/u on 09/01. She was overloaded by exam and OptiVol. She was instructed to take metolazone X 1 and furosemide was switched to Torsemide. Arlyce Harman increased to 25 mg daily.  Today she returns for HF follow up, here with paramedic Katie. Overall feeling fair. She has generalized fatigue. SHe has been more SOB this past week with a cough. Denies palpitations, CP, dizziness, edema, or PND/Orthopnea. Appetite ok. No fever or chills. Weight at home 182 pounds. Taking all medications.     Cardiac Studies:  - Echo (4/23): EF 25-30% - Echo (10/17): EF 25-30%, Grade 1 DD - Echo (6/16): EF 30-35% mild MR - Echo (2/15): EF 20-25%  - Echo (7/13): EF 20-25%   - R/LHC (10/17): normal cors  RA = 3 RV = 23/4 PA = 22/6 (14) PCW = 8 Ao = 123/69  (91) LV = 106/6 Fick cardiac output/index = 7.4/3.5 PVR = < 1.0 WU SVR = 958  FA sat = 88% PA sat = 63%, 62%   - CPX (5/18)  FVC 1.99 (61%)      FEV1 1.59 (61%)        FEV1/FVC 80 (99%)        MVV 58  (57%)       Resting HR: 81 Peak HR: 121   (72% age predicted max HR) BP rest: 108/68 BP peak: 144/80 Peak VO2: 12.3 (61% predicted peak VO2) VE/VCO2 slope:  25 OUES: 2.13 Peak RER: 0.98 Ventilatory Threshold: 11.2 (55% predicted or measured peak VO2) VE/MVV:  59% PETCO2 at peak:  40 O2pulse:  10   (91% predicted O2pulse)  - CPX (6/15): FVC 1.69 (51%)  FEV1 1.24 (47%)  FEV1/FVC 74%  Peak VO2: 12.6 (57.8% predicted peak VO2) VE/VCO2 slope: 28.6 OUES: 1.60 Peak RER: 1.01 Peak VO2 adjusted to the patient's ideal body weight of 150 lb (67.8 kg) the peak VO2 is 16.5 ml/kg (ibw)/min (62% of the ibw-adjusted predicted).  SH: She is not a smoker. She does not drink alcohol. Lives alone. She has 3 grown children   FH: Mom breast cancer. Father cancer.   ROS: All systems negative except as listed in HPI, PMH and Problem List.  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    Chronic systolic heart failure (HCC)    Dysrhythmia    Bradycardia   Hypertension    Hypertensive heart disease with CHF ([place])    ICD (implantable cardiac defibrillator) in place 08/13/2012   Current Outpatient Medications  Medication Sig Dispense Refill   amiodarone (PACERONE) 200 MG tablet Take 1 tablet (200 mg total) by mouth daily. 30 tablet 6   carvedilol (COREG) 3.125 MG tablet Take 1 tablet (3.125 mg total) by mouth 2 (two) times daily with a meal. 60 tablet 3   dapagliflozin propanediol (FARXIGA)  10 MG TABS tablet Take 1 tablet (10 mg total) by mouth daily. Needs appt for future refills 30 tablet 6   ferrous sulfate 325 (65 FE) MG tablet Take 325 mg by mouth daily with breakfast.     Multiple Vitamin (MULTIVITAMIN WITH MINERALS) TABS Take 1 tablet by mouth daily.     potassium chloride SA (KLOR-CON M) 20 MEQ tablet Take 3 tablets (60 mEq total) by mouth 2 (two) times daily. 180 tablet 3   spironolactone (ALDACTONE) 25 MG tablet Take 1 tablet (25 mg total) by mouth daily. Hold if systolic blood pressure less than 100. 15 tablet 3   torsemide (DEMADEX) 20 MG tablet Take 2 tablets (40 mg total) by mouth 2 (two) times daily. 360 tablet 3   No current facility-administered medications for this encounter.   BP 98/68   Pulse (!) 106   Wt 82.9 kg (182 lb 12.8 oz)   SpO2 96%   BMI 28.63 kg/m   Wt Readings from Last 3 Encounters:  09/01/22 82.9 kg (182 lb 12.8 oz)  08/13/22 86.2 kg (190 lb)  08/08/22 88.2 kg (194 lb 6.4 oz)    PHYSICAL EXAM: General:  NAD. No resp difficulty, fatigued-appearing. HEENT: Normal Neck: Supple. No JVD. Carotids 2+ bilat; no bruits. No lymphadenopathy or thryomegaly appreciated. Cor: PMI nondisplaced. Regular rate & rhythm. No rubs, gallops or murmurs. Lungs: Clear Abdomen: Soft, nontender, nondistended. No hepatosplenomegaly. No bruits or masses. Good bowel sounds. Extremities: No cyanosis, clubbing, rash, 1+ BLE edema Neuro: Alert & oriented x 3, cranial nerves grossly intact. Moves all 4 extremities w/o difficulty. Affect pleasant.  Device interrogation: OptiVol up, thoracic impedence down, 1 hr/day activity, no VT (personally reviewed)  ASSESSMENT & PLAN: 1. Chronic Systolic Heart Failure:  - Normal cors by cath 2013. Nonischemic cardiomyopathy s/p Medtronic ICD.  - Echo (6/16): EF 30-35%. - Echo (10/17): EF 25-30%.   - Echo (6/19): EF 30-35%. - Echo (4/23): EF 25-30% - ICD generator change 11/14/19 - NYHA III. Volume looks OK today, weight down  12 lbs, but OptiVol up with more shortness of breath. - Increase torsemide to 80 mg qam/40 mg qpm. - No BP room for ARNi/ARB. - Continue spiro 25 mg daily - Continue Coreg 3.125 mg bid. - Continue Farxiga 10 mg daily.     - Labs today, repeat BMET in 10-14 days.                                                                                                   -  2. SVT and VT  - s/p atrial tachycardia ablation in 01/2014.   - She has follow up with Dr. Caryl Comes. - Device check 7/25: 6 monitored VT, runs SVT. Amiodarone increased to 200 mg daily. - TSH and LFTs OK 4/23.  3. SDOH - Compliance has improved with Paramedicine program. Appreciate assistance. - Uninsured. HFSW helping.  Follow up in 3 months with Dr. Haroldine Laws, as scheduled.  Gardner, FNP-BC 09/01/2022

## 2022-09-01 NOTE — Telephone Encounter (Signed)
Opened in error

## 2022-09-03 ENCOUNTER — Other Ambulatory Visit (HOSPITAL_COMMUNITY): Payer: Self-pay

## 2022-09-11 ENCOUNTER — Telehealth (HOSPITAL_COMMUNITY): Payer: Self-pay

## 2022-09-11 NOTE — Telephone Encounter (Signed)
Pt reached out to me to cancel our appointment this morning.  She wants to resch for next week.   [name], [place] 09/11/2022

## 2022-09-15 ENCOUNTER — Other Ambulatory Visit (HOSPITAL_COMMUNITY): Payer: Self-pay

## 2022-09-16 ENCOUNTER — Encounter: Payer: Self-pay | Admitting: Cardiology

## 2022-09-16 ENCOUNTER — Ambulatory Visit: Payer: Self-pay | Attending: Internal Medicine | Admitting: Internal Medicine

## 2022-09-16 ENCOUNTER — Encounter: Payer: Self-pay | Admitting: Internal Medicine

## 2022-09-16 VITALS — BP 100/64 | HR 99 | Ht 67.0 in | Wt 181.8 lb

## 2022-09-16 DIAGNOSIS — I420 Dilated cardiomyopathy: Secondary | ICD-10-CM

## 2022-09-16 DIAGNOSIS — Z95810 Presence of automatic (implantable) cardiac defibrillator: Secondary | ICD-10-CM

## 2022-09-16 DIAGNOSIS — I472 Ventricular tachycardia, unspecified: Secondary | ICD-10-CM

## 2022-09-16 NOTE — Progress Notes (Signed)
Patient Care Team: Patient, No Pcp Per as PCP - General (General Practice)   HPI  Sydney Shaffer is a 56 y.o. female in follow-up for an ICD Medtronic  implant initially 2013 for primary prevention in the setting of nonischemic cardiomyopathy.  GEN change 12/20  She has had SVT and underwent ablation of the septal atrial tachycardia as well as slow pathway.  Recurrent atrial arrhythmias 2017 and ventricular tachycardia treated with amiodarone.  Has been maintained on amiodarone.  Rare palpitations.  Recently reestablished with heart failure.  Following bout with pneumonia complicated by heart failure.  Doing better she thinks on her medications.  Willing to take them.  Patient denies symptoms of respiratory, GI intolerance, sun sensitivity, neurological symptoms attributable to amiodarone.      DATE TEST EF    7/13 Echo  20-25 %    2/15 Echo 20-25 %    10/17 Echo  30-35    10/17 Cath   Normal CA  6/19 Echo  30-35%    4/23 Echo  25-30% MR mod          Date Cr K Hgb TSH LFTs PFTs  9/17        2.555 22    1/18 0.83 4.5          9/19 0.78 4.6 12.8(4/19) 2.9 15    12/20 1.97 3.8 13.6 1.46(9/20) 21   9/23 1.12 3.8<<2.8 13.0 (7/23) 1.8 (4/23)         Records and Results Reviewed   Past Medical History:  Diagnosis Date   Anemia    felt to be due to heavy menstrual flow   Atrial tachycardia    s/p ablation   AVNRT (AV nodal re-entry tachycardia)    s/p ablation   CHF (congestive heart failure) ([place])    Dx 03/2012 - dilated cardiomyopathy with EF 20-25% by echo (abnl nuc but normal coronaries 04/02/12 per cath.    Chronic systolic heart failure (HCC)    Dysrhythmia    Bradycardia   Hypertension    Hypertensive heart disease with CHF ([place])    ICD (implantable cardiac defibrillator) in place 08/13/2012    Past Surgical History:  Procedure Laterality Date   APPENDECTOMY     CARDIAC CATHETERIZATION  April 2013   normal coronaries   CARDIAC CATHETERIZATION [name]  09/12/2016   Procedure: Right/Left Heart Cath and Coronary Angiography;  Surgeon: Jolaine Artist, MD;  Location: [facility];  Service: Cardiovascular;  Laterality: [name];   DILITATION & CURRETTAGE/HYSTROSCOPY WITH VERSAPOINT RESECTION [name] 10/05/2015   Procedure: DILATATION & CURETTAGE/HYSTEROSCOPY WITH VERSAPOINT RESECTION;  Surgeon: [name], MD;  Location: [facility];  Service: Gynecology;  Laterality: [name];   EP study and ablation  01/07/13   Ablation of AVNRT and atrial tachycardia (arising from the anteroseptal RA 23m above the HIS)   ICD  08/13/2012   ICD GENERATOR CHANGEOUT [name] 11/14/2019   Procedure: ICD GCascade  Surgeon: KDeboraha Sprang MD;  [facility];  Service: Cardiovascular;  Laterality: [name];   IMPLANTABLE CARDIOVERTER DEFIBRILLATOR IMPLANT [name] 08/13/2012   Procedure: IMPLANTABLE CARDIOVERTER DEFIBRILLATOR IMPLANT;  Surgeon: SDeboraha Sprang MD;  [facility];  Service: Cardiovascular;  Laterality: [name];   LEFT HEART CATHETERIZATION WITH CORONARY ANGIOGRAM [name] 04/02/2012   Procedure: LEFT HEART CATHETERIZATION WITH CORONARY ANGIOGRAM;  Surgeon: Peter M JMartinique MD;  [facility];  Service: Cardiovascular;  Laterality: [name];   SUPRAVENTRICULAR TACHYCARDIA ABLATION  [name] 01/07/2013   Procedure: SUPRAVENTRICULAR TACHYCARDIA ABLATION;  Surgeon: Thompson Grayer, MD;  Location[facility];  Service: Cardiovascular;  Laterality: [name];   TUBAL LIGATION      Current Meds  Medication Sig   amiodarone (PACERONE) 200 MG tablet Take 1 tablet (200 mg total) by mouth daily.   carvedilol (COREG) 3.125 MG tablet Take 1 tablet (3.125 mg total) by mouth 2 (two) times daily with a meal.   dapagliflozin propanediol (FARXIGA) 10 MG TABS tablet Take 1 tablet (10 mg total) by mouth daily. Needs appt for future refills   ferrous sulfate 325 (65 FE) MG tablet Take 325 mg by mouth daily with breakfast.   Multiple Vitamin (MULTIVITAMIN WITH MINERALS) TABS Take  1 tablet by mouth daily.   potassium chloride SA (KLOR-CON M) 20 MEQ tablet Take 3 tablets (60 mEq total) by mouth 2 (two) times daily.   spironolactone (ALDACTONE) 25 MG tablet Take 1 tablet (25 mg total) by mouth daily. Hold if systolic blood pressure less than 100.   torsemide (DEMADEX) 20 MG tablet Take 4 tablets (80 mg total) by mouth in the morning AND 2 tablets (40 mg total) every evening.    No Known Allergies    Review of Systems negative except from HPI and PMH  Physical Exam BP 100/64   Pulse 99   Ht '5\' 7"'$  (1.702 m)   Wt 181 lb 12.8 oz (82.5 kg)   SpO2 93%   BMI 28.47 kg/m  Well developed and well nourished in no acute distress HENT normal Neck supple with JVP-flat Clear Device pocket well healed; without hematoma or erythema.  There is no tethering  Regular rate and rhythm, no  murmur Abd-soft with active BS No Clubbing cyanosis  edema Skin-warm and dry A & Oriented  Grossly normal sensory and motor function  ECG sinus at 99 Levels 18/10/37 Axis left -57 RSR prime T wave inversions anterolaterally  Estimated Creatinine Clearance: 61.2 mL/min (A) (by C-G formula based on SCr of 1.12 mg/dL (H)).   Assessment and  Plan  Nonischemic cardiomyopathy   Atrial Tachycardia/Flutter (CL310)   ICD Medtronic     CHF chronic systolic  High Risk Medication Surveillance amiodarone  Capture management discombobulated and   On maximally tolerated medications for her nonischemic cardiomyopathy with limitations related to both blood pressure and finances  Device function is normal.  She is off amiodarone for 3 months or so, there was no interval increased burden of atrial tachycardia/flutter of the amiodarone, and given her young age we have discussed discontinuing it and allowing it to recur.  Given mapping and ablation technology evolution, this I think is a better long-term direction than ongoing amiodarone.  We discussed this idea and she is amenable to  continuing.  Device function is normal. Programming changes capture management was turned off as ventricular pacing is problematic See Paceart for details

## 2022-09-18 ENCOUNTER — Telehealth (HOSPITAL_COMMUNITY): Payer: Self-pay

## 2022-09-18 NOTE — Telephone Encounter (Addendum)
Pt reached out to me this morning ref appointment today. She is handling some family issues this morning and not avail for our 11 appointment. She asked to do 1pm but unfortunately I was not free but told her I could do later around 4.  she did not respond.    I also reviewed her notes from the previous office visit she had and if I understand it correctly, it appears it was reported she has been off her amio for 3 months.  I do see her about every other week, (she cancels some) and at those times, she has been taking amio.  Tried to send message in-basket to [facility] triage staff but epic was not allowing it to be sent.  Not sure who to route note to, but Michiel Cowboy,  is going to assist in finding out info.   Just wanted clarification on meds, make sure they are correct and if there was a misunderstanding about amio it be relayed to provider in case it changes care plan.   Will f/u next week.    [name], [place] 09/18/2022

## 2022-09-19 NOTE — Addendum Note (Signed)
Addended by: Janan Halter F on: 09/19/2022 06:06 AM   Modules accepted: Orders

## 2022-09-22 NOTE — Telephone Encounter (Signed)
It would appear from Dr Olin Pia note below the plan is for pt to stay off Amiodarone and will plan for Afib ablation consideration.    Device function is normal.  She is off amiodarone for 3 months or so, there was no interval increased burden of atrial tachycardia/flutter of the amiodarone, and given her young age we have discussed discontinuing it and allowing it to recur.  Given mapping and ablation technology evolution, this I think is a better long-term direction than ongoing amiodarone.  We discussed this idea and she is amenable to continuing.

## 2022-09-25 ENCOUNTER — Telehealth (HOSPITAL_COMMUNITY): Payer: Self-pay

## 2022-09-25 NOTE — Telephone Encounter (Signed)
Pt contacted me to cancel our appointment today due to her being at appointment with her daughter.  Will f/u next week.   Sydney Shaffer, [place] 09/25/2022

## 2022-10-01 ENCOUNTER — Encounter (INDEPENDENT_AMBULATORY_CARE_PROVIDER_SITE_OTHER): Payer: Self-pay | Admitting: Primary Care

## 2022-10-01 ENCOUNTER — Ambulatory Visit (INDEPENDENT_AMBULATORY_CARE_PROVIDER_SITE_OTHER): Payer: Self-pay | Admitting: Primary Care

## 2022-10-01 ENCOUNTER — Other Ambulatory Visit (HOSPITAL_COMMUNITY): Payer: Self-pay

## 2022-10-01 VITALS — BP 103/72 | HR 87 | Resp 16 | Ht 68.0 in | Wt 182.2 lb

## 2022-10-01 DIAGNOSIS — Z7689 Persons encountering health services in other specified circumstances: Secondary | ICD-10-CM

## 2022-10-01 DIAGNOSIS — Z23 Encounter for immunization: Secondary | ICD-10-CM

## 2022-10-01 DIAGNOSIS — Z1211 Encounter for screening for malignant neoplasm of colon: Secondary | ICD-10-CM

## 2022-10-01 DIAGNOSIS — Z1231 Encounter for screening mammogram for malignant neoplasm of breast: Secondary | ICD-10-CM

## 2022-10-01 DIAGNOSIS — Z1159 Encounter for screening for other viral diseases: Secondary | ICD-10-CM

## 2022-10-01 NOTE — Progress Notes (Signed)
Paramedicine Encounter    Patient ID: Sydney Shaffer, female    DOB: 18-Jan-1966, 56 y.o.   MRN: [id number]  Pt was seen by PCP today.  She felt dizzy earlier. Her b/p was low today.  It has not been said yet if she is come off amio or not-- have not heard back directly from dr Aquilla Hacker office- I will message tri[facility] at [facility] and see if I can get an answer back.  She does feel bloated and she does have some edema to her legs, she said a good weight for her is around 178-180. She has not taken any meds today yet.  She is drinking a few bottles of water a day and also eating lots of fruit. I advised her that certain fruits has a lot of water in it and to back off those.  She denies c/p or palpitations.   Meds verified and pill box checked and filled back up.   Refills needed:  Gareth Eagle  Called both those in while I was here.   BP 100/78   Pulse 94   Resp 18   Wt 182 lb (82.6 kg)   SpO2 97%   BMI 27.67 kg/m  Weight yesterday-182 Last visit [place]   Patient Care Team: Patient, No Pcp Per as PCP - General (General Practice)  Patient Active Problem List   Diagnosis Date Noted  . Dilated cardiomyopathy ([place]) 06/04/2015  . Symptomatic anemia 06/04/2015  . Chest pain 06/04/2015  . Abnormal uterine bleeding (AUB) 06/04/2015  . Metrorrhagia 06/04/2015  . Abdominal pain   . Hypokalemia   . Menorrhagia   . VT (ventricular tachycardia) ([place]) 07/22/2014  . SVT (supraventricular tachycardia) ([place]) 03/30/2014  . HTN (hypertension) 03/30/2014  . Chest pressure- unspecified 02/03/2014  . Atrial tachycardia 10/01/2012  . 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  . Hypertensive heart disease with CHF (congestive heart failure) ([place]) 08/04/2012  . Nonischemic dilated cardiomyopathy ([place]) 07/29/2012  . Acute on chronic systolic heart failure ([place]) 05/05/2012  . Dyspnea 03/29/2012  . Anemia 03/29/2012    Current Outpatient Medications:  .  carvedilol (COREG) 3.125 MG  tablet, Take 1 tablet (3.125 mg total) by mouth 2 (two) times daily with a meal., Disp: 60 tablet, Rfl: 3 .  dapagliflozin propanediol (FARXIGA) 10 MG TABS tablet, Take 1 tablet (10 mg total) by mouth daily. Needs appt for future refills, Disp: 30 tablet, Rfl: 6 .  ferrous sulfate 325 (65 FE) MG tablet, Take 325 mg by mouth daily with breakfast., Disp: , Rfl:  .  Multiple Vitamin (MULTIVITAMIN WITH MINERALS) TABS, Take 1 tablet by mouth daily., Disp: , Rfl:  .  potassium chloride SA (KLOR-CON M) 20 MEQ tablet, Take 3 tablets (60 mEq total) by mouth 2 (two) times daily., Disp: 180 tablet, Rfl: 3 .  spironolactone (ALDACTONE) 25 MG tablet, Take 1 tablet (25 mg total) by mouth daily. Hold if systolic blood pressure less than 100., Disp: 15 tablet, Rfl: 3 .  torsemide (DEMADEX) 20 MG tablet, Take 4 tablets (80 mg total) by mouth in the morning AND 2 tablets (40 mg total) every evening., Disp: 545 tablet, Rfl: 2 .  amiodarone (PACERONE) 200 MG tablet, Take 1 tablet (200 mg total) by mouth daily. (Patient not taking: Reported on 10/01/2022), Disp: 30 tablet, Rfl: 6 No Known Allergies    Social History   Socioeconomic History  . Marital status: Single    Spouse name: Not on file  . Number of children: 3  .  Years of education: Not on file  . Highest education level: Not on file  Occupational History  . Occupation: Retail buyer: [employer]  . Occupation: office cleaning  Tobacco Use  . Smoking status: Never  . Smokeless tobacco: Never  Vaping Use  . Vaping Use: Never used  Substance and Sexual Activity  . Alcohol use: Yes    Alcohol/week: 1.0 standard drink of alcohol    Types: 1 Glasses of wine per week    Comment: daily  . Drug use: No  . Sexual activity: Yes    Birth control/protection: Condom  Other Topics Concern  . Not on file  Social History Narrative  . Not on file   Social Determinants of Health   Financial Resource Strain: High Risk (07/02/2022)   Overall  Financial Resource Strain (CARDIA)   . Difficulty of Paying Living Expenses: Hard  Food Insecurity: No Food Insecurity (07/02/2022)   Hunger Vital Sign   . Worried About Charity fundraiser in the Last Year: Never true   . Ran Out of Food in the Last Year: Never true  Transportation Needs: No Transportation Needs (07/02/2022)   PRAPARE - Transportation   . Lack of Transportation (Medical): No   . Lack of Transportation (Non-Medical): No  Physical Activity: Not on file  Stress: Not on file  Social Connections: Not on file  Intimate Partner Violence: Not on file    Physical Exam      Future Appointments  Date Time Provider [place]  11/12/2022 10:40 AM Bensimhon, Shaune Pascal, MD [facility] None  12/30/2022  8:20 AM [facility] DEVICE REMOTES [facility]  03/31/2023  8:20 AM [facility] DEVICE REMOTES [facility]       [name], EMT-Paramedic [phone] [facility] Paramedic  10/01/22

## 2022-10-01 NOTE — Progress Notes (Signed)
New Patient Office Visit  Subjective    Patient ID: Sydney Shaffer, female    DOB: 05/31/1966  Age: 56 y.o. MRN: [id number]  CC:  Chief Complaint  Patient presents with   New Patient (Initial Visit)    HPI Sydney Shaffer is a 56 year old  female that may presents to establish care.  She has significant cardiac diagnoses and followed closely by cardiology.  Patient voices concerns with right side underneath the breast having pain and radiating to her back.  She denies any urinary problems and she has had some chills unknown fever.  Pain today 1/10 this is after taking ibuprofen without medication she is 7/10.  Denies any acid reflux symptoms. Patient has No headache, No chest pain, No abdominal pain - No Nausea, No new weakness tingling or numbness, No Cough - shortness of breath    Outpatient Encounter Medications as of 10/01/2022  Medication Sig   amiodarone (PACERONE) 200 MG tablet Take 1 tablet (200 mg total) by mouth daily.   carvedilol (COREG) 3.125 MG tablet Take 1 tablet (3.125 mg total) by mouth 2 (two) times daily with a meal.   dapagliflozin propanediol (FARXIGA) 10 MG TABS tablet Take 1 tablet (10 mg total) by mouth daily. Needs appt for future refills   ferrous sulfate 325 (65 FE) MG tablet Take 325 mg by mouth daily with breakfast.   Multiple Vitamin (MULTIVITAMIN WITH MINERALS) TABS Take 1 tablet by mouth daily.   potassium chloride SA (KLOR-CON M) 20 MEQ tablet Take 3 tablets (60 mEq total) by mouth 2 (two) times daily.   spironolactone (ALDACTONE) 25 MG tablet Take 1 tablet (25 mg total) by mouth daily. Hold if systolic blood pressure less than 100.   torsemide (DEMADEX) 20 MG tablet Take 4 tablets (80 mg total) by mouth in the morning AND 2 tablets (40 mg total) every evening.   No facility-administered encounter medications on file as of 10/01/2022.    Past Medical History:  Diagnosis Date   Anemia    felt to be due to heavy menstrual flow   Atrial tachycardia     s/p ablation   AVNRT (AV nodal re-entry tachycardia)    s/p ablation   CHF (congestive heart failure) ([place])    Dx 03/2012 - dilated cardiomyopathy with EF 20-25% by echo (abnl nuc but normal coronaries 04/02/12 per cath.    Chronic systolic heart failure (HCC)    Dysrhythmia    Bradycardia   Hypertension    Hypertensive heart disease with CHF ([place])    ICD (implantable cardiac defibrillator) in place 08/13/2012    Past Surgical History:  Procedure Laterality Date   APPENDECTOMY     CARDIAC CATHETERIZATION  April 2013   normal coronaries   CARDIAC CATHETERIZATION [name] 09/12/2016   Procedure: Right/Left Heart Cath and Coronary Angiography;  Surgeon: Jolaine Artist, MD;  Location: [facility];  Service: Cardiovascular;  Laterality: [name];   DILITATION & CURRETTAGE/HYSTROSCOPY WITH VERSAPOINT RESECTION [name] 10/05/2015   Procedure: DILATATION & CURETTAGE/HYSTEROSCOPY WITH VERSAPOINT RESECTION;  Surgeon: [name], MD;  Location: [facility];  Service: Gynecology;  Laterality: [name];   EP study and ablation  01/07/13   Ablation of AVNRT and atrial tachycardia (arising from the anteroseptal RA 56m above the HIS)   ICD  08/13/2012   ICD GENERATOR CHANGEOUT [name] 11/14/2019   Procedure: ICD [place]  Surgeon: KDeboraha Sprang MD;  [facility];  Service: Cardiovascular;  Laterality: [name];  IMPLANTABLE CARDIOVERTER DEFIBRILLATOR IMPLANT [name] 08/13/2012   Procedure: IMPLANTABLE CARDIOVERTER DEFIBRILLATOR IMPLANT;  Surgeon: Deboraha Sprang, MD;  Location: [facility];  Service: Cardiovascular;  Laterality: [name];   LEFT HEART CATHETERIZATION WITH CORONARY ANGIOGRAM [name] 04/02/2012   Procedure: LEFT HEART CATHETERIZATION WITH CORONARY ANGIOGRAM;  Surgeon: Peter M Martinique, MD;  Location: [facility] CATH LAB;  Service: Cardiovascular;  Laterality: [name];   SUPRAVENTRICULAR TACHYCARDIA ABLATION [name] 01/07/2013   Procedure: SUPRAVENTRICULAR TACHYCARDIA ABLATION;  Surgeon: Thompson Grayer, MD;   Location: [facility] CATH LAB;  Service: Cardiovascular;  Laterality: [name];   TUBAL LIGATION      Family History  Problem Relation Age of Onset   Breast cancer Mother    Cancer Father     Social History   Socioeconomic History   Marital status: Single    Spouse name: Not on file   Number of children: 3   Years of education: Not on file   Highest education level: Not on file  Occupational History   Occupation: receptionist    Employer: [employer]   Occupation: office cleaning  Tobacco Use   Smoking status: Never   Smokeless tobacco: Never  Vaping Use   Vaping Use: Never used  Substance and Sexual Activity   Alcohol use: Yes    Alcohol/week: 1.0 standard drink of alcohol    Types: 1 Glasses of wine per week    Comment: daily   Drug use: No   Sexual activity: Yes    Birth control/protection: Condom  Other Topics Concern   Not on file  Social History Narrative   Not on file   Social Determinants of Health   Financial Resource Strain: High Risk (07/02/2022)   Overall Financial Resource Strain (CARDIA)    Difficulty of Paying Living Expenses: Hard  Food Insecurity: No Food Insecurity (07/02/2022)   Hunger Vital Sign    Worried About Running Out of Food in the Last Year: Never true    Ran Out of Food in the Last Year: Never true  Transportation Needs: No Transportation Needs (07/02/2022)   PRAPARE - Hydrologist (Medical): No    Lack of Transportation (Non-Medical): No  Physical Activity: Not on file  Stress: Not on file  Social Connections: Not on file  Intimate Partner Violence: Not on file    ROS Comprehensive ROS Pertinent positive and negative noted in HPI     Objective    BP 103/72   Pulse 87   Resp 16   Ht '5\' 8"'$  (1.727 m)   Wt 182 lb 3.2 oz (82.6 kg)   SpO2 95%   BMI 27.70 kg/m   Physical exam: General: Vital signs reviewed.  Patient is well-developed and well-nourished, female  in no acute distress and cooperative with  exam. Head: Normocephalic and atraumatic. Eyes: EOMI, conjunctivae normal, no scleral icterus. Neck: Supple, trachea midline, normal ROM, no JVD, masses, thyromegaly, or carotid bruit present. Cardiovascular: RRR, S1 normal, S2 normal, no murmurs, gallops, or rubs. Pulmonary/Chest: Clear to auscultation bilaterally, no wheezes, rales, or rhonchi. Abdominal: Soft, non-tender, non-distended, BS +, no masses, organomegaly, or guarding present. Musculoskeletal: No joint deformities, erythema, or stiffness, ROM full and nontender. Extremities: No lower extremity edema bilaterally,  pulses symmetric and intact bilaterally. No cyanosis or clubbing. Neurological: A&O x3, Strength is normal Skin: Warm, dry and intact. No rashes or erythema. Psychiatric: Normal mood and affect. speech and behavior is normal. Cognition and memory are normal.  Assessment & Plan:  Allanna was seen today for new patient (initial visit).  Diagnoses and all orders for this visit: Establish care  Need for immunization against influenza -     Flu Vaccine QUAD 4moIM (Fluarix, Fluzone & Alfiuria Quad PF)  Encounter for screening mammogram for malignant neoplasm of breast -     MM DIGITAL SCREENING BILATERAL; Future  Colon cancer screening -     Fecal occult blood, imunochemical  Need for hepatitis C screening test -     HCV Ab w Reflex to Quant PCR  Other orders -     Interpretation:    MKerin Perna NP

## 2022-10-02 LAB — HCV INTERPRETATION

## 2022-10-02 LAB — HCV AB W REFLEX TO QUANT PCR: HCV Ab: NONREACTIVE

## 2022-10-03 ENCOUNTER — Other Ambulatory Visit (HOSPITAL_COMMUNITY): Payer: Self-pay

## 2022-10-09 ENCOUNTER — Telehealth (HOSPITAL_COMMUNITY): Payer: Self-pay

## 2022-10-09 ENCOUNTER — Other Ambulatory Visit (HOSPITAL_COMMUNITY): Payer: Self-pay

## 2022-10-09 NOTE — Telephone Encounter (Signed)
Pt contacted me reporting she may not be able be meet due to her having to go somewhere to get paperwork done about a mammogram.   She also reported she tried to get her farxiga filled and a guy there said it couldn't be filled and she needed samples from clinic and have it mailed to her but I contacted [facility] to request farxiga to be filled and to confirm it was under HF fund and the lady I spoke to said it was covered and could be filled.  I sent text to pt at her request to let her know this so she can go by and get it. She will call me if she returns home in time for me to come out to see her before she leaves for work.   [name], [place] 10/09/22

## 2022-10-16 ENCOUNTER — Other Ambulatory Visit (HOSPITAL_COMMUNITY): Payer: Self-pay

## 2022-10-16 NOTE — Progress Notes (Signed)
Paramedicine Encounter    Patient ID: Sydney Shaffer, female    DOB: Oct 10, 1966, 56 y.o.   MRN: [id number]  Pt reports they would not give her the farxiga again-she was told it had to be mailed to her house. I called them again and said it would be filled for her.   -she is also c/o random vomiting spells.  -for ex- yesterday when she was taking her potassium with ginger ale and when she was gagging taking it she did vomit then but then while at work she was eating the first few bites of her food and then she threw up.  She denies any bleeding in stool, she reports bowels normal.  She does get abd pain at times and then this is when the vomit starts. When she does get pain it is in the upper rt quad area around her rib cage. The pain lasts for about 40mn and after she vomits it goes away and then it seems she feels better.  She has very poor appetite. She is scared to eat much.   [facility] have to see how things look with her unemployment check to see if this will be affordable.   Her company is closing down 12/15. She is going to be offered unemployment.  Since she does not have insurance at this time getting her in with GI doc will not be possible.  She reports her breathing is doing ok.  She has been able to take her spiro and carvedilol-they have been over 100.  Pt denies dizziness. She ddi already take the carvedilol this morning.  B/p lower today-post meds.  Still have not [facility]  Denies c/p, no palpitations.  Reminded her of appoint in dec at CHF clinic.  No edema noted to her legs. She does feel bloated though.  She reports good urine output.  Meds verified  She had some pills in pill box-I rechecked those and refilled the rest of her pill box.  Will f/u with her next week.   BP 90/70   Pulse 88   Resp 16   Wt 181 lb (82.1 kg)   SpO2 94%   BMI 27.52 kg/m  Weight  yesterday-180 Last visit [facility] Patient Care Team: Patient, No Pcp Per as PCP - General (General Practice)  Patient Active Problem List   Diagnosis Date Noted   Dilated cardiomyopathy ([place] 06/04/2015   Symptomatic anemia 06/04/2015   Chest pain 06/04/2015   Abnormal uterine bleeding (AUB) 06/04/2015   Metrorrhagia 06/04/2015   Abdominal pain    Hypokalemia    Menorrhagia    VT (ventricular tachycardia) ([place] 07/22/2014   SVT (supraventricular tachycardia) ([place] 03/30/2014   HTN (hypertension) 03/30/2014   Chest pressure- unspecified 02/03/2014   Atrial tachycardia 10/01/2012   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   Hypertensive heart disease with CHF (congestive heart failure) ([place] 08/04/2012   Nonischemic dilated cardiomyopathy ([place] 07/29/2012   Acute on chronic systolic heart failure ([place] 05/05/2012   Dyspnea 03/29/2012   Anemia 03/29/2012    Current Outpatient Medications:    amiodarone (PACERONE) 200 MG tablet, Take 1 tablet (200 mg total) by mouth daily., Disp: 30 tablet, Rfl: 6   carvedilol (COREG) 3.125 MG tablet, Take 1 tablet (3.125 mg total) by mouth 2 (two) times daily with a meal., Disp: 60 tablet, Rfl: 3   dapagliflozin propanediol (FARXIGA) 10 MG TABS tablet, Take 1 tablet (  10 mg total) by mouth daily. Needs appt for future refills, Disp: 30 tablet, Rfl: 6   ferrous sulfate 325 (65 FE) MG tablet, Take 325 mg by mouth daily with breakfast., Disp: , Rfl:    Multiple Vitamin (MULTIVITAMIN WITH MINERALS) TABS, Take 1 tablet by mouth daily., Disp: , Rfl:    potassium chloride SA (KLOR-CON M) 20 MEQ tablet, Take 3 tablets (60 mEq total) by mouth 2 (two) times daily., Disp: 180 tablet, Rfl: 3   spironolactone (ALDACTONE) 25 MG tablet, Take 1 tablet (25 mg total) by mouth daily. Hold if systolic blood pressure less than 100., Disp: 15 tablet, Rfl: 3   torsemide (DEMADEX) 20 MG tablet, Take 4 tablets (80 mg total) by mouth in the morning AND 2 tablets (40  mg total) every evening., Disp: 545 tablet, Rfl: 2 No Known Allergies    Social History   Socioeconomic History   Marital status: Single    Spouse name: Not on file   Number of children: 3   Years of education: Not on file   Highest education level: Not on file  Occupational History   Occupation: receptionist    Employer: [employer]   Occupation: office cleaning  Tobacco Use   Smoking status: Never   Smokeless tobacco: Never  Vaping Use   Vaping Use: Never used  Substance and Sexual Activity   Alcohol use: Yes    Alcohol/week: 1.0 standard drink of alcohol    Types: 1 Glasses of wine per week    Comment: daily   Drug use: No   Sexual activity: Yes    Birth control/protection: Condom  Other Topics Concern   Not on file  Social History Narrative   Not on file   Social Determinants of Health   Financial Resource Strain: High Risk (07/02/2022)   Overall Financial Resource Strain (CARDIA)    Difficulty of Paying Living Expenses: Hard  Food Insecurity: No Food Insecurity (07/02/2022)   Hunger Vital Sign    Worried About Running Out of Food in the Last Year: Never true    Ran Out of Food in the Last Year: Never true  Transportation Needs: No Transportation Needs (07/02/2022)   PRAPARE - Hydrologist (Medical): No    Lack of Transportation (Non-Medical): No  Physical Activity: Not on file  Stress: Not on file  Social Connections: Not on file  Intimate Partner Violence: Not on file    Physical Exam      Future Appointments  Date Time Provider [place]  11/12/2022 10:40 AM Bensimhon, Shaune Pascal, MD [facility] None  12/30/2022  8:20 AM [facility] DEVICE REMOTES [facility]  03/31/2023  8:20 AM [facility] DEVICE REMOTES [facility]       [name], EMT-Paramedic [phone] [facility] Paramedic  10/16/22

## 2022-10-23 ENCOUNTER — Other Ambulatory Visit (HOSPITAL_COMMUNITY): Payer: Self-pay

## 2022-10-23 NOTE — Progress Notes (Signed)
Came out today for our appointment time and no answer at door after knocking several times and no answer from several phone calls either.   Sydney Shaffer, [place] 10/23/2022

## 2022-10-29 ENCOUNTER — Other Ambulatory Visit (HOSPITAL_COMMUNITY): Payer: Self-pay

## 2022-10-29 NOTE — Progress Notes (Signed)
Paramedicine Encounter    Patient ID: Sydney Shaffer, female    DOB: [birth year], 56 y.o.   MRN: [id number]  Pt was unable to meet last week due to family emergency.  She reports she is feeling tired today. She had to come up a few steps from going out to her car to get that rx and she got winded.  Pt reports one day at work she had a pain in her chest that felt like pressure. She reports it lasted about hour. The pain went away on its own. She did get diaphoretic during that time. He denies c/p since then. She also had nausea at that time too. Advised her should this happen again then to stop what she is doing and call 911.  She was just standing at work when this happened, no strenuous activity.  She does have clinic appointment on 12/6.  no dizziness. She denies increased sob upon normal activities.   She just p/u her farxiga.  She has taken her meds except her torsemide due to the plumber coming out today. She will take them once they leave.  She does feel bloated. But again she has not taken her torsemide yet. She also admits to drinking more fluids.  She does limit her salt intake, she just struggles with dry mouth and loves to eat ice.  So we talked about other options to resolve that but she said the gum and candy does not work well for her.  Pt did not receive her replacement transmission home remote device yet. That has been a few wks, I advised her to call them back to let them know it was never received.   Meds verified and pill box refilled.   Refills needed: Potassium-she will call that in for refill   BP 100/74   Pulse 95   Resp 18   Wt 179 lb (81.2 kg)   SpO2 98%   BMI 27.22 kg/m  Weight yesterday-179 Last visit weight-181  Patient Care Team: Patient, No Pcp Per as PCP - General (General Practice)  Patient Active Problem List   Diagnosis Date Noted  . Dilated cardiomyopathy ([place]) 06/04/2015  . Symptomatic anemia 06/04/2015  . Chest pain 06/04/2015  . Abnormal  uterine bleeding (AUB) 06/04/2015  . Metrorrhagia 06/04/2015  . Abdominal pain   . Hypokalemia   . Menorrhagia   . VT (ventricular tachycardia) ([place]) 07/22/2014  . SVT (supraventricular tachycardia) ([place]) 03/30/2014  . HTN (hypertension) 03/30/2014  . Chest pressure- unspecified 02/03/2014  . Atrial tachycardia 10/01/2012  . 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  . Hypertensive heart disease with CHF (congestive heart failure) ([place]) 08/04/2012  . Nonischemic dilated cardiomyopathy ([place]) 07/29/2012  . Acute on chronic systolic heart failure ([place]) 05/05/2012  . Dyspnea 03/29/2012  . Anemia 03/29/2012    Current Outpatient Medications:  .  amiodarone (PACERONE) 200 MG tablet, Take 1 tablet (200 mg total) by mouth daily., Disp: 30 tablet, Rfl: 6 .  carvedilol (COREG) 3.125 MG tablet, Take 1 tablet (3.125 mg total) by mouth 2 (two) times daily with a meal., Disp: 60 tablet, Rfl: 3 .  dapagliflozin propanediol (FARXIGA) 10 MG TABS tablet, Take 1 tablet (10 mg total) by mouth daily. Needs appt for future refills, Disp: 30 tablet, Rfl: 6 .  ferrous sulfate 325 (65 FE) MG tablet, Take 325 mg by mouth daily with breakfast., Disp: , Rfl:  .  Multiple Vitamin (MULTIVITAMIN WITH MINERALS) TABS, Take 1 tablet by mouth daily., Disp: , Rfl:  .  potassium chloride SA (KLOR-CON M) 20 MEQ tablet, Take 3 tablets (60 mEq total) by mouth 2 (two) times daily., Disp: 180 tablet, Rfl: 3 .  spironolactone (ALDACTONE) 25 MG tablet, Take 1 tablet (25 mg total) by mouth daily. Hold if systolic blood pressure less than 100. (Patient taking differently: Take 25 mg by mouth at bedtime. Hold if systolic blood pressure less than 100.), Disp: 15 tablet, Rfl: 3 .  torsemide (DEMADEX) 20 MG tablet, Take 4 tablets (80 mg total) by mouth in the morning AND 2 tablets (40 mg total) every evening., Disp: 545 tablet, Rfl: 2 No Known Allergies    Social History   Socioeconomic History  . Marital status: Single     Spouse name: Not on file  . Number of children: 3  . Years of education: Not on file  . Highest education level: Not on file  Occupational History  . Occupation: Retail buyer: [employer]  . Occupation: office cleaning  Tobacco Use  . Smoking status: Never  . Smokeless tobacco: Never  Vaping Use  . Vaping Use: Never used  Substance and Sexual Activity  . Alcohol use: Yes    Alcohol/week: 1.0 standard drink of alcohol    Types: 1 Glasses of wine per week    Comment: daily  . Drug use: No  . Sexual activity: Yes    Birth control/protection: Condom  Other Topics Concern  . Not on file  Social History Narrative  . Not on file   Social Determinants of Health   Financial Resource Strain: High Risk (07/02/2022)   Overall Financial Resource Strain (CARDIA)   . Difficulty of Paying Living Expenses: Hard  Food Insecurity: No Food Insecurity (07/02/2022)   Hunger Vital Sign   . Worried About Charity fundraiser in the Last Year: Never true   . Ran Out of Food in the Last Year: Never true  Transportation Needs: No Transportation Needs (07/02/2022)   PRAPARE - Transportation   . Lack of Transportation (Medical): No   . Lack of Transportation (Non-Medical): No  Physical Activity: Not on file  Stress: Not on file  Social Connections: Not on file  Intimate Partner Violence: Not on file    Physical Exam      Future Appointments  Date Time Provider [place]  11/12/2022 10:40 AM Bensimhon, Shaune Pascal, MD [facility] None  12/30/2022  8:20 AM [facility] DEVICE REMOTES [facility]  03/31/2023  8:20 AM [facility] DEVICE REMOTES [facility]       [name], EMT-P-Paramedic [place] Paramedic  10/29/22

## 2022-11-06 ENCOUNTER — Other Ambulatory Visit (HOSPITAL_COMMUNITY): Payer: Self-pay

## 2022-11-06 NOTE — Progress Notes (Signed)
Paramedicine Encounter    Patient ID: Sydney Shaffer, female    DOB: 1966-06-18, 56 y.o.   MRN: [id number]  Pt reports she is doing ok. She feels very tired at times.  Her scale batteries are dead. She is going to get new batteries this week.  Pt reports her breathing has been up and down.  No dizziness. No edema noted to her legs.  She was in a rush today b/c she had to get to work early for a meeting.  Pt denies c/p.  She has had a lot going on with her daughter who is pregnant.  She still gets sob at times. Stairs makes it worse.  She has been cutting back on her fluids.  She has been checking her b/p at home and only a few days where its been over 100 but she has not been able to take her evening dose of carvedilol or the spiro.  She did miss a few doses of her meds this past week.  She has not taken any meds yet this morning. She reports her b/p have not been over 100 but a couple times this week so none of the evening dose of carvedilol or spiro was taken this week.   Meds verified and pill box refilled.   BP (!) 96/0   Pulse (!) 101   Resp 16   SpO2 96%  Weight yesterday-? Last visit weight-179  Patient Care Team: Patient, No Pcp Per as PCP - General (General Practice)  Patient Active Problem List   Diagnosis Date Noted   Dilated cardiomyopathy ([place]) 06/04/2015   Symptomatic anemia 06/04/2015   Chest pain 06/04/2015   Abnormal uterine bleeding (AUB) 06/04/2015   Metrorrhagia 06/04/2015   Abdominal pain    Hypokalemia    Menorrhagia    VT (ventricular tachycardia) ([place]) 07/22/2014   SVT (supraventricular tachycardia) ([place]) 03/30/2014   HTN (hypertension) 03/30/2014   Chest pressure- unspecified 02/03/2014   Atrial tachycardia 10/01/2012   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   Hypertensive heart disease with CHF (congestive heart failure) ([place]) 08/04/2012   Nonischemic dilated cardiomyopathy ([place]) 07/29/2012   Acute on chronic systolic heart failure ([place])  05/05/2012   Dyspnea 03/29/2012   Anemia 03/29/2012    Current Outpatient Medications:    amiodarone (PACERONE) 200 MG tablet, Take 1 tablet (200 mg total) by mouth daily., Disp: 30 tablet, Rfl: 6   carvedilol (COREG) 3.125 MG tablet, Take 1 tablet (3.125 mg total) by mouth 2 (two) times daily with a meal., Disp: 60 tablet, Rfl: 3   dapagliflozin propanediol (FARXIGA) 10 MG TABS tablet, Take 1 tablet (10 mg total) by mouth daily. Needs appt for future refills, Disp: 30 tablet, Rfl: 6   ferrous sulfate 325 (65 FE) MG tablet, Take 325 mg by mouth daily with breakfast., Disp: , Rfl:    Multiple Vitamin (MULTIVITAMIN WITH MINERALS) TABS, Take 1 tablet by mouth daily., Disp: , Rfl:    potassium chloride SA (KLOR-CON M) 20 MEQ tablet, Take 3 tablets (60 mEq total) by mouth 2 (two) times daily., Disp: 180 tablet, Rfl: 3   spironolactone (ALDACTONE) 25 MG tablet, Take 1 tablet (25 mg total) by mouth daily. Hold if systolic blood pressure less than 100. (Patient taking differently: Take 25 mg by mouth at bedtime. Hold if systolic blood pressure less than 100.), Disp: 15 tablet, Rfl: 3   torsemide (DEMADEX) 20 MG tablet, Take 4 tablets (80 mg total) by mouth in the morning AND 2 tablets (40 mg total)  every evening., Disp: 545 tablet, Rfl: 2 No Known Allergies    Social History   Socioeconomic History   Marital status: Single    Spouse name: Not on file   Number of children: 3   Years of education: Not on file   Highest education level: Not on file  Occupational History   Occupation: receptionist    Employer: [employer]   Occupation: office cleaning  Tobacco Use   Smoking status: Never   Smokeless tobacco: Never  Vaping Use   Vaping Use: Never used  Substance and Sexual Activity   Alcohol use: Yes    Alcohol/week: 1.0 standard drink of alcohol    Types: 1 Glasses of wine per week    Comment: daily   Drug use: No   Sexual activity: Yes    Birth control/protection: Condom  Other Topics  Concern   Not on file  Social History Narrative   Not on file   Social Determinants of Health   Financial Resource Strain: High Risk (07/02/2022)   Overall Financial Resource Strain (CARDIA)    Difficulty of Paying Living Expenses: Hard  Food Insecurity: No Food Insecurity (07/02/2022)   Hunger Vital Sign    Worried About Running Out of Food in the Last Year: Never true    Ran Out of Food in the Last Year: Never true  Transportation Needs: No Transportation Needs (07/02/2022)   PRAPARE - Hydrologist (Medical): No    Lack of Transportation (Non-Medical): No  Physical Activity: Not on file  Stress: Not on file  Social Connections: Not on file  Intimate Partner Violence: Not on file    Physical Exam      Future Appointments  Date Time Provider [place]  11/12/2022 10:40 AM Bensimhon, Shaune Pascal, MD [facility] None  12/30/2022  8:20 AM [facility] DEVICE REMOTES [facility]  03/31/2023  8:20 AM [facility] DEVICE REMOTES [facility]       [name], EMT-P-Paramedic [place] Par[facility]ic  11/06/22

## 2022-11-12 ENCOUNTER — Encounter (HOSPITAL_COMMUNITY): Payer: Self-pay | Admitting: Internal Medicine

## 2022-11-12 ENCOUNTER — Ambulatory Visit (INDEPENDENT_AMBULATORY_CARE_PROVIDER_SITE_OTHER): Payer: Self-pay

## 2022-11-12 ENCOUNTER — Ambulatory Visit (HOSPITAL_COMMUNITY)
Admission: RE | Admit: 2022-11-12 | Discharge: 2022-11-12 | Disposition: A | Discharge status: Home / Self Care | Payer: Self-pay | Source: Ambulatory Visit | Attending: Internal Medicine | Admitting: Internal Medicine

## 2022-11-12 ENCOUNTER — Other Ambulatory Visit (HOSPITAL_COMMUNITY): Payer: Self-pay

## 2022-11-12 VITALS — BP 118/78 | HR 94 | Wt 179.4 lb

## 2022-11-12 DIAGNOSIS — I471 Supraventricular tachycardia, unspecified: Secondary | ICD-10-CM | POA: Insufficient documentation

## 2022-11-12 DIAGNOSIS — R0602 Shortness of breath: Secondary | ICD-10-CM | POA: Insufficient documentation

## 2022-11-12 DIAGNOSIS — Z79899 Other long term (current) drug therapy: Secondary | ICD-10-CM | POA: Insufficient documentation

## 2022-11-12 DIAGNOSIS — I5022 Chronic systolic (congestive) heart failure: Secondary | ICD-10-CM | POA: Insufficient documentation

## 2022-11-12 DIAGNOSIS — I420 Dilated cardiomyopathy: Secondary | ICD-10-CM | POA: Insufficient documentation

## 2022-11-12 DIAGNOSIS — I472 Ventricular tachycardia, unspecified: Secondary | ICD-10-CM

## 2022-11-12 DIAGNOSIS — Z7984 Long term (current) use of oral hypoglycemic drugs: Secondary | ICD-10-CM | POA: Insufficient documentation

## 2022-11-12 DIAGNOSIS — Z95810 Presence of automatic (implantable) cardiac defibrillator: Secondary | ICD-10-CM | POA: Insufficient documentation

## 2022-11-12 DIAGNOSIS — I110 Hypertensive heart disease with heart failure: Secondary | ICD-10-CM | POA: Insufficient documentation

## 2022-11-12 LAB — BASIC METABOLIC PANEL
Anion gap: 10 (ref 5–15)
BUN: 19 mg/dL (ref 6–20)
CO2: 27 mmol/L (ref 22–32)
Calcium: 9.2 mg/dL (ref 8.9–10.3)
Chloride: 101 mmol/L (ref 98–111)
Creatinine, Ser: 1.16 mg/dL — ABNORMAL HIGH (ref 0.44–1.00)
GFR, Estimated: 55 mL/min — ABNORMAL LOW (ref >60)
Glucose, Bld: 117 mg/dL — ABNORMAL HIGH (ref 70–99)
Potassium: 2.9 mmol/L — ABNORMAL LOW (ref 3.5–5.1)
Sodium: 138 mmol/L (ref 135–145)

## 2022-11-12 LAB — CUP PACEART REMOTE DEVICE CHECK
Battery Remaining Longevity: 101 mo
Battery Voltage: 3.01 V
Brady Statistic AP VP Percent: 0 %
Brady Statistic AP VS Percent: 0 %
Brady Statistic AS VP Percent: 0 %
Brady Statistic AS VS Percent: 100 %
Brady Statistic RA Percent Paced: 0 %
Brady Statistic RV Percent Paced: 0 %
Date Time Interrogation Session: 20231206120218
HighPow Impedance: 69 Ohm
Implantable Lead Connection Status: 753985
Implantable Lead Connection Status: 753985
Implantable Lead Implant Date: 20130906
Implantable Lead Implant Date: 20130906
Implantable Lead Location: 753859
Implantable Lead Location: 753860
Implantable Lead Model: 181
Implantable Lead Model: 5076
Implantable Lead Serial Number: 322962
Implantable Pulse Generator Implant Date: 20201207
Lead Channel Impedance Value: 342 Ohm
Lead Channel Impedance Value: 361 Ohm
Lead Channel Impedance Value: 399 Ohm
Lead Channel Pacing Threshold Amplitude: 0.5 V
Lead Channel Pacing Threshold Amplitude: 1.625 V
Lead Channel Pacing Threshold Pulse Width: 0.4 ms
Lead Channel Pacing Threshold Pulse Width: 0.4 ms
Lead Channel Sensing Intrinsic Amplitude: 1.5 mV
Lead Channel Sensing Intrinsic Amplitude: 1.5 mV
Lead Channel Sensing Intrinsic Amplitude: 12.125 mV
Lead Channel Sensing Intrinsic Amplitude: 12.125 mV
Lead Channel Setting Pacing Amplitude: 1.5 V
Lead Channel Setting Pacing Amplitude: 2.5 V
Lead Channel Setting Pacing Pulse Width: 1 ms
Lead Channel Setting Sensing Sensitivity: 0.3 mV
Zone Setting Status: 755011

## 2022-11-12 LAB — BRAIN NATRIURETIC PEPTIDE: B Natriuretic Peptide: 769.2 pg/mL — ABNORMAL HIGH (ref 0.0–100.0)

## 2022-11-12 MED ORDER — LOSARTAN POTASSIUM 25 MG PO TABS
12.5000 mg | ORAL_TABLET | Freq: Every day | ORAL | 3 refills | Status: DC
  Filled 2022-11-12: qty 15, 30d supply, fill #0

## 2022-11-12 MED ORDER — LOSARTAN POTASSIUM 25 MG PO TABS
12.5000 mg | ORAL_TABLET | Freq: Every day | ORAL | 3 refills | Status: DC
  Filled 2022-11-12: qty 15, 30d supply, fill #0
  Filled 2023-02-05: qty 15, 30d supply, fill #1
  Filled 2023-03-02: qty 15, 30d supply, fill #2
  Filled 2023-04-22: qty 15, 30d supply, fill #3

## 2022-11-12 MED ORDER — AMIODARONE HCL 200 MG PO TABS
100.0000 mg | ORAL_TABLET | Freq: Every day | ORAL | 6 refills | Status: DC
  Filled 2022-11-12: qty 15, 30d supply, fill #0
  Filled 2023-03-02: qty 15, 30d supply, fill #1
  Filled 2023-04-22: qty 15, 30d supply, fill #2

## 2022-11-12 NOTE — Addendum Note (Signed)
Encounter addended by: Kerry Dory, CMA on: 11/12/2022 11:47 AM  Actions taken: Order list changed, Diagnosis association updated, Clinical Note Signed, Charge Capture section accepted

## 2022-11-12 NOTE — Progress Notes (Unsigned)
Paramedicine Encounter   Patient ID: Sydney Shaffer , female,   DOB: [birth year],56 y.o.,  MRN: [id number]   Met patient in clinic today with provider.   Some sob with long walking.  Spoke with Bensimhon about the amio, he will cut back to amio to 171m and then she needs to f/u with dr kCaryl Comes   Weight @ clinic-179 B/P-118/78 P-94 SP02-98  Med changes-  -cut back amio to 1067mdaily.  -will start losartan to 12.5 mg at night time.   Labs done today.   Advised her to contact the device support to see about getting another device.  Will go see her tomor for med rec.    KaMarylouise StacksEMDes Moines2/06/2022

## 2022-11-12 NOTE — Progress Notes (Signed)
Advanced Heart Failure Clinic Note   PCP: Linward Natal, PA-C ([facility]) EP: Dr Caryl Comes Medtronic ICD HF Cardiologist: Dr. Haroldine Laws  HPI: Sydney Shaffer is a 56 y.o.female with a history of chronic systolic HF due to nonischemic cardiomyopathy s/p Medtronic ICD, HTN, and SVT s/p 01/08/14 ablation.  Cath 4/13 normal coronaries.  Lost to follow up until 5/19. Echo 6/19 EF 30-35% mild MR. Saw Dr. Caryl Comes in 9/20 and had some recurrent brief. SVT. No VT. S/p ICD gen change 12/20.  Reestablished with HF clinic in April 2023. Had been out of some medications.   Echo 04/23: EF 25-30%, LV severely dilated, grade III DD, RV okay, RVSP 49 mmHg, moderate MR, mild to moderate TR   Seen in Urgent Care twice in July with dyspnea and cough and treated for PNA and diuretics increased temporarily. Seen in ED 07/01/22 with recurrent symptoms and was volume overloaded. HF consulted. Diuresed with 80 mg lasix IV BID. Had been out of diuretics for several days and not taking all of her medications. She was given prescriptions for lasix and [name] and referred for paramedicine.  Amiodarone increased d/t episodes of VT (monitor only) and runs SVT and AF.   She was seen for f/u on 09/01. She was overloaded by exam and OptiVol. She was instructed to take metolazone X 1 and furosemide was switched to Torsemide. Arlyce Harman increased to 25 mg daily.  Today she returns for HF follow up, here with paramedic Katie. Says she is feeling better. Works at night and sleeps during the day. 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 Gets tired with work. Gets tired at walking 5 mins + SOB. Minimal edema. Compliant with meds.    Cardiac Studies:  - Echo (4/23): EF 25-30% - Echo (10/17): EF 25-30%, Grade 1 DD - Echo (6/16): EF 30-35% mild MR - Echo (2/15): EF 20-25%  - Echo (7/13): EF 20-25%   - R/LHC (10/17): normal cors  RA = 3 RV = 23/4 PA = 22/6 (14) PCW = 8 Ao = 123/69 (91) LV  = 106/6 Fick cardiac output/index = 7.4/3.5 PVR = < 1.0 WU SVR = 958  FA sat = 88% PA sat = 63%, 62%   - CPX (5/18)  FVC 1.99 (61%)      FEV1 1.59 (61%)        FEV1/FVC 80 (99%)        MVV 58  (57%)       Resting HR: 81 Peak HR: 121   (72% age predicted max HR) BP rest: 108/68 BP peak: 144/80 Peak VO2: 12.3 (61% predicted peak VO2) VE/VCO2 slope:  25 OUES: 2.13 Peak RER: 0.98 Ventilatory Threshold: 11.2 (55% predicted or measured peak VO2) VE/MVV:  59% PETCO2 at peak:  40 O2pulse:  10   (91% predicted O2pulse)  - CPX (6/15): FVC 1.69 (51%)  FEV1 1.24 (47%)  FEV1/FVC 74%  Peak VO2: 12.6 (57.8% predicted peak VO2) VE/VCO2 slope: 28.6 OUES: 1.60 Peak RER: 1.01 Peak VO2 adjusted to the patient's ideal body weight of 150 lb (67.8 kg) the peak VO2 is 16.5 ml/kg (ibw)/min (62% of the ibw-adjusted predicted).  SH: She is not a smoker. She does not drink alcohol. Lives alone. She has 3 grown children   FH: Mom breast cancer. Father cancer.   ROS: All systems negative except as listed in HPI, PMH and Problem List.  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    Chronic systolic heart failure (H[facility])    Dysrhythmia    Bradycardia   Hypertension    Hypertensive heart disease with CHF ([place])    ICD (implantable cardiac defibrillator) in place 08/13/2012   Current Outpatient Medications  Medication Sig Dispense Refill   amiodarone (PACERONE) 200 MG tablet Take 1 tablet (200 mg total) by mouth daily. 30 tablet 6   carvedilol (COREG) 3.125 MG tablet Take 1 tablet (3.125 mg total) by mouth 2 (two) times daily with a meal. 60 tablet 3   dapagliflozin propanediol (FARXIGA) 10 MG TABS tablet  Take 1 tablet (10 mg total) by mouth daily. Needs appt for future refills 30 tablet 6   ferrous sulfate 325 (65 FE) MG tablet Take 325 mg by mouth daily with breakfast.     Multiple Vitamin (MULTIVITAMIN WITH MINERALS) TABS Take 1 tablet by mouth daily.     potassium chloride SA (KLOR-CON M) 20 MEQ tablet Take 3 tablets (60 mEq total) by mouth 2 (two) times daily. 180 tablet 3   spironolactone (ALDACTONE) 25 MG tablet Take 1 tablet (25 mg total) by mouth daily. Hold if systolic blood pressure less than 100. 15 tablet 3   torsemide (DEMADEX) 20 MG tablet Take 4 tablets (80 mg total) by mouth in the morning AND 2 tablets (40 mg total) every evening. 545 tablet 2   No current facility-administered medications for this encounter.   BP 118/78   Pulse 94   Wt 81.4 kg (179 lb 6.4 oz)   SpO2 98%   BMI 27.28 kg/m   Wt Readings from Last 3 Encounters:  11/12/22 81.4 kg (179 lb 6.4 oz)  10/29/22 81.2 kg (179 lb)  10/16/22 82.1 kg (181 lb)    PHYSICAL EXAM: General:  Well appearing. No resp difficulty HEENT: normal Neck: supple. no JVD. Carotids 2+ bilat; no bruits. No lymphadenopathy or thryomegaly appreciated. Cor: PMI nondisplaced. Regular rate & rhythm. No rubs, gallops or murmurs. Lungs: clear Abdomen: soft, nontender, nondistended. No hepatosplenomegaly. No bruits or masses. Good bowel sounds. Extremities: no cyanosis, clubbing, rash, edema Neuro: alert & orientedx3, cranial nerves grossly intact. moves all 4 extremities w/o difficulty. Affect pleasant   Device interrogation: No VT. Intermittent episodes SVT Optivol ok. Activity level 1hr/day. Personally reviewed  ASSESSMENT & PLAN: 1. Chronic Systolic Heart Failure:  - Normal cors by cath 2013. Nonischemic cardiomyopathy s/p Medtronic ICD.  - Echo (6/16): EF 30-35%. - Echo (10/17): EF 25-30%.   - Echo (6/19): EF 30-35%. - Echo (4/23): EF 25-30% - ICD generator change 11/14/19 - NYHA II-III. Volume looks OK today, weight down 12  lbs, but OptiVol up with more shortness of breath. - Continue torsemide 80/40 - SBP has improved some SBP 95-105 with Paramedicine - Will start losartan 12.5 qhs - Continue spiro 25 mg daily - Continue Coreg 3.125 mg bid. - Continue Farxiga 10 mg daily.     - labs today - Echo at next visit                                                                                             -  2. SVT and VT  - s/p atrial tachycardia ablation in 01/2014.   - Device check 7/25: 6 monitored VT, runs SVT. Amiodarone increased to 200 mg daily. - She follows with Dr. Caryl Comes. At last visit, he thought she was off amio but still taking 200 daily. I will cut to 100 daily and have her f/u with him to reassess tachycardia burden  3. SDOH - Compliance has improved greatly  with Paramedicine program. Appreciate assistance.   Glori Bickers, MD 11/12/2022

## 2022-11-12 NOTE — Addendum Note (Signed)
Encounter addended by: Kerry Dory, CMA on: 11/12/2022 11:52 AM  Actions taken: Order list changed

## 2022-11-12 NOTE — Patient Instructions (Signed)
DECREASE Amiodarone to 100 mg (one half tab daily) START Losartan 12.5 mg (one half tab) nightly at bedtime  Labs today We will only contact you if something comes back abnormal or we need to make some changes. Otherwise no news is good news!  You have been referred to [facility]- Electrophysiology with Dr Caryl Comes -they will be in contact with an appointment  Your physician recommends that you schedule a follow-up appointment in: 3-4 months with Dr Haroldine Laws and echo  Your physician has requested that you have an echocardiogram. Echocardiography is a painless test that uses sound waves to create images of your heart. It provides your doctor with information about the size and shape of your heart and how well your heart's chambers and valves are working. This procedure takes approximately one hour. There are no restrictions for this procedure. Please do NOT wear cologne, perfume, aftershave, or lotions (deodorant is allowed). Please arrive 15 minutes prior to your appointment time.  Do the following things EVERYDAY: Weigh yourself in the morning before breakfast. Write it down and keep it in a log. Take your medicines as prescribed Eat low salt foods--Limit salt (sodium) to 2000 mg per day.  Stay as active as you can everyday Limit all fluids for the day to less than 2 liters  At the [facility], you and your health needs are our priority. As part of our continuing mission to provide you with exceptional heart care, we have created designated Provider Care Teams. These Care Teams include your primary Cardiologist (physician) and Advanced Practice Providers (APPs- Physician Assistants and Nurse Practitioners) who all work together to provide you with the care you need, when you need it.   You may see any of the following providers on your designated Care Team at your next follow up: Dr Glori Bickers Dr [name] Dr. Roxana Hires, NP Lyda Jester,  [facility] [place], [place] Na, NP Audry Riles, PharmD   Please be sure to bring in all your medications bottles to every appointment.

## 2022-11-13 ENCOUNTER — Telehealth (HOSPITAL_COMMUNITY): Payer: Self-pay

## 2022-11-13 NOTE — Telephone Encounter (Signed)
Pt reached out to me to cancel our appointment for the med rec today due to another appointment she forgot she had.  Will see her next week then.  Sydney Shaffer, [place] 11/13/2022

## 2022-11-14 ENCOUNTER — Telehealth (HOSPITAL_COMMUNITY): Payer: Self-pay

## 2022-11-14 DIAGNOSIS — I5022 Chronic systolic (congestive) heart failure: Secondary | ICD-10-CM

## 2022-11-14 NOTE — Telephone Encounter (Signed)
-----   Message from Jolaine Artist, MD sent at 11/14/2022  4:12 PM EST ----- She is scheduled to take potassium 120 bid. Is she taking? If not she needs to take them and repeat BMET.   If she is taking needs to take an extra 120 today. Repeat BMET next week

## 2022-11-14 NOTE — Telephone Encounter (Signed)
Patient reports she has missed some doses; lab appt scheduled, lab order entered.   Orders Placed This Encounter  Procedures   Basic metabolic panel    Standing Status:   Future    Standing Expiration Date:   11/15/2023    Order Specific Question:   Release to patient    Answer:   Immediate    Order Specific Question:   Release to patient    Answer:   Immediate [1]

## 2022-11-18 ENCOUNTER — Encounter: Payer: Self-pay | Admitting: Internal Medicine

## 2022-11-18 NOTE — Progress Notes (Deleted)
Patient Care Team: Patient, No Pcp Per as PCP - General (General Practice)   HPI  Sydney Shaffer is a 56 y.o. female in follow-up for an ICD Medtronic  implant initially 2013 for primary prevention in the setting of nonischemic cardiomyopathy.  GEN change 12/20  She has had SVT and underwent ablation of the septal atrial tachycardia as well as slow pathway.  Recurrent atrial arrhythmias 2017 and ventricular tachycardia treated with amiodarone.  Has been maintained on amiodarone stopped about 6/23 given concerns about long-term toxicity in this young lady with the idea of mapping and ablation   Rare palpitations.  Recently reestablished with heart failure.  Following bout with pneumonia complicated by heart failure.  Doing better she thinks on her medications.  Willing to take them.  Patient denies symptoms of respiratory, GI intolerance, sun sensitivity, neurological symptoms attributable to amiodarone.      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    Chronic systolic heart failure (H[facility])    Dysrhythmia    Bradycardia   Hypertension    Hypertensive heart disease with CHF ([place])    ICD (implantable cardiac defibrillator) in place 08/13/2012    Past Surgical History:  Procedure Laterality Date    APPENDECTOMY     CARDIAC CATHETERIZATION  April 2013   normal coronaries   CARDIAC CATHETERIZATION [name] 09/12/2016   Procedure: Right/Left Heart Cath and Coronary Angiography;  Surgeon: Jolaine Artist, MD;  Location: [facility];  Service: Cardiovascular;  Laterality: [name];   DILITATION & CURRETTAGE/HYSTROSCOPY WITH VERSAPOINT RESECTION [name] 10/05/2015   Procedure: DILATATION & CURETTAGE/HYSTEROSCOPY WITH VERSAPOINT RESECTION;  Surgeon: [name], MD;  Location: [facility];  Service: Gynecology;  Laterality: [name];   EP study and ablation  01/07/13   Ablation of AVNRT and atrial tachycardia (arising from the anteroseptal RA 76m above the HIS)   ICD  08/13/2012   ICD GENERATOR CHANGEOUT [name] 11/14/2019   Procedure: ICD [place]  Surgeon: KDeboraha Sprang MD;  [facility];  Service: Cardiovascular;  Laterality: [name];   IMPLANTABLE CARDIOVERTER DEFIBRILLATOR IMPLANT [name] 08/13/2012   Procedure: IMPLANTABLE CARDIOVERTER DEFIBRILLATOR IMPLANT;  Surgeon: SDeboraha Sprang MD;  [facility];  Service: Cardiovascular;  Laterality: [name];   LEFT HEART CATHETERIZATION WITH CORONARY ANGIOGRAM [name] 04/02/2012   Procedure: LEFT HEART CATHETERIZATION WITH CORONARY ANGIOGRAM;  Surgeon:  Peter M Martinique, MD;  Location: [facility] LAB;  Service: Cardiovascular;  Laterality: [name];   SUPRAVENTRICULAR TACHYCARDIA ABLATION [name] 01/07/2013   Procedure: SUPRAVENTRICULAR TACHYCARDIA ABLATION;  Surgeon: Thompson Grayer, MD;  Location: [facility] LAB;  Service: Cardiovascular;  Laterality: [name];   TUBAL LIGATION      No outpatient medications have been marked as taking for the 11/18/22 encounter (Appointment) with Deboraha Sprang, MD.    No Known Allergies    Review of Systems negative except from HPI and PMH  Physical Exam There were no vitals taken for this visit. Well developed and well nourished in no acute distress HENT normal Neck supple with JVP-flat Clear Device pocket well  healed; without hematoma or erythema.  There is no tethering  Regular rate and rhythm, no *** gallop No ***/*** murmur Abd-soft with active BS No Clubbing cyanosis *** edema Skin-warm and dry A & Oriented  Grossly normal sensory and motor function  ECG ***  Device function is ***normal. ***Programming changes ***  See Paceart for details    Estimated Creatinine Clearance: 59.9 mL/min (A) (by C-G formula based on SCr of 1.16 mg/dL (H)).   Assessment and  Plan  Nonischemic cardiomyopathy   Atrial Tachycardia/Flutter (CL310)   ICD Medtronic     CHF chronic systolic  High Risk Medication Surveillance amiodarone

## 2022-11-20 ENCOUNTER — Other Ambulatory Visit (HOSPITAL_COMMUNITY): Payer: Self-pay

## 2022-11-20 NOTE — Progress Notes (Signed)
Paramedicine Encounter    Patient ID: Sydney Shaffer, female    DOB: [birth year], 56 y.o.   MRN: [id number]  Pt reports she is doing ok. She has had dizzy episodes at times. She does feel some dizziness right now.  She reports she has been taking 1/2 of amio but her pill box does not reflect that.   She missed dr Caryl Comes office visit this week-said she got it confused with clinic appointment this week. She called during our visit and got it resc She denies any increased sob, no c/p, no palpitations, appetite is same.  She did reach the device support for her home transmitter and they are sending another one out and she is having it delivered to her sisters house.   Her labs last time showed low potassium- she took extra 69mq tablets.  I sent triage message to report the correction of her home dose--think it was a typo.  Meds verified and pill box refilled and corrected  Refills needed-she can get these called in Potassium  Torsemide   BP 98/78   Pulse 100   Resp 16   Wt 178 lb (80.7 kg)   SpO2 97%   BMI 27.06 kg/m  Weight yesterday-178 Last visit weight-179 @ clinic   Patient Care Team: Patient, No Pcp Per as PCP - General (General Practice)  Patient Active Problem List   Diagnosis Date Noted   [place]ilated [place] ([place] 06/04/2015   Symptomatic anemia 06/04/2015   Chest pain 06/04/2015   Abnormal uterine bleeding (AUB) 06/04/2015   Metrorrhagia 06/04/2015   Abdominal pain    Hypokalemia    Menorrhagia    VT (ventricular tachycardia) ([place] 07/22/2014   SVT (supraventricular tachycardia) ([place] 03/30/2014   HTN (hypertension) 03/30/2014   Chest pressure- unspecified 02/03/2014   Atrial tachycardia 10/01/2012   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   Hypertensive heart disease with CHF (congestive heart failure) ([place] 08/04/2012   Nonischemic dilated cardiomyopathy ([facility] 07/29/2012   Acute on chronic systolic heart failure ([place] 05/05/2012   Dyspnea 03/29/2012    Anemia 03/29/2012    Current Outpatient Medications:    amiodarone (PACERONE) 200 MG tablet, Take 1/2 tablet (100 mg total) by mouth daily., Disp: 15 tablet, Rfl: 6   carvedilol (COREG) 3.125 MG tablet, Take 1 tablet (3.125 mg total) by mouth 2 (two) times daily with a meal., Disp: 60 tablet, Rfl: 3   dapagliflozin propanediol (FARXIGA) 10 MG TABS tablet, Take 1 tablet (10 mg total) by mouth daily. Needs appt for future refills, Disp: 30 tablet, Rfl: 6   ferrous sulfate 325 (65 FE) MG tablet, Take 325 mg by mouth daily with breakfast., Disp: , Rfl:    losartan (COZAAR) 25 MG tablet, Take 1/2 tablet (12.5 mg total) by mouth at bedtime., Disp: 15 tablet, Rfl: 3   Multiple Vitamin (MULTIVITAMIN WITH MINERALS) TABS, Take 1 tablet by mouth daily., Disp: , Rfl:    potassium chloride SA (KLOR-CON M) 20 MEQ tablet, Take 3 tablets (60 mEq total) by mouth 2 (two) times daily., Disp: 180 tablet, Rfl: 3   spironolactone (ALDACTONE) 25 MG tablet, Take 1 tablet (25 mg total) by mouth daily. Hold if systolic blood pressure less than 100., Disp: 15 tablet, Rfl: 3   torsemide (DEMADEX) 20 MG tablet, Take 4 tablets (80 mg total) by mouth in the morning AND 2 tablets (40 mg total) every evening., Disp: 545 tablet, Rfl: 2 No Known Allergies    Social History   Socioeconomic History   Marital status:  Single    Spouse name: Not on file   Number of children: 3   Years of education: Not on file   Highest education level: Not on file  Occupational History   Occupation: receptionist    Employer: [employer]   Occupation: office cleaning  Tobacco Use   Smoking status: Never   Smokeless tobacco: Never  Vaping Use   Vaping Use: Never used  Substance and Sexual Activity   Alcohol use: Yes    Alcohol/week: 1.0 standard drink of alcohol    Types: 1 Glasses of wine per week    Comment: daily   Drug use: No   Sexual activity: Yes    Birth control/protection: Condom  Other Topics Concern   Not on file   Social History Narrative   Not on file   Social Determinants of Health   Financial Resource Strain: High Risk (07/02/2022)   Overall Financial Resource Strain (CARDIA)    Difficulty of Paying Living Expenses: Hard  Food Insecurity: No Food Insecurity (07/02/2022)   Hunger Vital Sign    Worried About Running Out of Food in the Last Year: Never true    Ran Out of Food in the Last Year: Never true  Transportation Needs: No Transportation Needs (07/02/2022)   PRAPARE - Hydrologist (Medical): No    Lack of Transportation (Non-Medical): No  Physical Activity: Not on file  Stress: Not on file  Social Connections: Not on file  Intimate Partner Violence: Not on file    Physical Exam      Future Appointments  Date Time Provider [place]  11/21/2022 10:30 AM [facility] LAB [facility] None  12/22/2022 11:15 AM Deboraha Sprang, MD [facility]  02/11/2023  2:00 PM [facility] DEVICE REMOTES [facility]  03/02/2023 10:00 AM MC ECHO OP 1 [facility]  03/02/2023 11:20 AM Bensimhon, Shaune Pascal, MD [facility] None  05/13/2023  2:00 PM [facility] DEVICE REMOTES [facility]  08/12/2023  2:00 PM [facility] DEVICE REMOTES [facility]  11/11/2023  2:00 PM [facility] DEVICE REMOTES [facility]       [name], EMT-P-Paramedic [place] Paramedic  11/20/22

## 2022-11-21 ENCOUNTER — Other Ambulatory Visit (HOSPITAL_COMMUNITY): Payer: Self-pay

## 2022-11-21 ENCOUNTER — Telehealth (HOSPITAL_COMMUNITY): Payer: Self-pay | Admitting: Cardiology

## 2022-11-27 ENCOUNTER — Telehealth (HOSPITAL_COMMUNITY): Payer: Self-pay

## 2022-11-27 ENCOUNTER — Ambulatory Visit (HOSPITAL_COMMUNITY)
Admission: RE | Admit: 2022-11-27 | Discharge: 2022-11-27 | Disposition: A | Discharge status: Home / Self Care | Payer: Self-pay | Source: Ambulatory Visit | Attending: Internal Medicine | Admitting: Internal Medicine

## 2022-11-27 DIAGNOSIS — I5022 Chronic systolic (congestive) heart failure: Secondary | ICD-10-CM | POA: Insufficient documentation

## 2022-11-27 LAB — BASIC METABOLIC PANEL
Anion gap: 6 (ref 5–15)
BUN: 11 mg/dL (ref 6–20)
CO2: 28 mmol/L (ref 22–32)
Calcium: 9.4 mg/dL (ref 8.9–10.3)
Chloride: 100 mmol/L (ref 98–111)
Creatinine, Ser: 1.12 mg/dL — ABNORMAL HIGH (ref 0.44–1.00)
GFR, Estimated: 57 mL/min — ABNORMAL LOW (ref >60)
Glucose, Bld: 174 mg/dL — ABNORMAL HIGH (ref 70–99)
Potassium: 3.3 mmol/L — ABNORMAL LOW (ref 3.5–5.1)
Sodium: 134 mmol/L — ABNORMAL LOW (ref 135–145)

## 2022-11-27 NOTE — Telephone Encounter (Signed)
Pt had labs today at the time she could meet me, then she said she had another appointment after that and would contact me later on if she was available this afternoon.   As of 5pm I did not hear from her.  Will try again next week.   [name], [place] 11/27/2022

## 2022-11-27 NOTE — Telephone Encounter (Addendum)
Pt's missed previous appointment with dr Caryl Comes.  Appointment rescheduled for 12/22/2021 with Dr Caryl Comes. Pt was seen by Dr Haroldine Laws on 11/12/2022 and Amiodarone was decreased to '100mg'$  daily.

## 2022-12-02 ENCOUNTER — Telehealth (HOSPITAL_COMMUNITY): Payer: Self-pay | Admitting: Surgery

## 2022-12-02 ENCOUNTER — Other Ambulatory Visit: Payer: Self-pay

## 2022-12-02 NOTE — Telephone Encounter (Signed)
-----   Message from Rafael Bihari, [place] sent at 11/28/2022  3:49 PM EST ----- K improved but still low. Is she taking K consistently? If so, take extra 40 x 2 days.  Repeat BMET in 7-10 days

## 2022-12-02 NOTE — Telephone Encounter (Signed)
I called patient to review results and recommendations.  I left a message for a return call.

## 2022-12-03 ENCOUNTER — Telehealth (HOSPITAL_COMMUNITY): Payer: Self-pay | Admitting: *Deleted

## 2022-12-03 ENCOUNTER — Encounter (HOSPITAL_COMMUNITY): Payer: Self-pay

## 2022-12-03 NOTE — Telephone Encounter (Signed)
Pt left vm for lab results I called her back no answer/left vm.

## 2022-12-04 ENCOUNTER — Telehealth (HOSPITAL_COMMUNITY): Payer: Self-pay | Admitting: Surgery

## 2022-12-04 ENCOUNTER — Other Ambulatory Visit (HOSPITAL_COMMUNITY): Payer: Self-pay

## 2022-12-04 DIAGNOSIS — I5022 Chronic systolic (congestive) heart failure: Secondary | ICD-10-CM

## 2022-12-04 NOTE — Telephone Encounter (Signed)
Hey- I seen you tried to reach her about her labs. I am here with her now and she did report missing a few days of her potassium.  She said she is back taking them regularly now.   Can you go ahead and sch her repeat labs,she would like an early morning on any day of week is ok.    Sydney Shaffer, [place] 12/04/2022

## 2022-12-04 NOTE — Telephone Encounter (Signed)
I called patient after receiving a message from [facility] that she was agreeable to return for labwork. I scheduled her appt for tomorrow Dec 29th at 0900.  I left a message on patient's phone line to indicate that I scheduled labs and also sent message back to HF [employer].

## 2022-12-04 NOTE — Progress Notes (Signed)
Paramedicine Encounter    Patient ID: Sydney Shaffer, female    DOB: 11-28-1966, 56 y.o.   MRN: [id number]  Pt reports that she has been had some dizziness  this morning. It subsided after she got up after a few min. She thinks she got up too fast.   I seen where clinic was trying to reach her regarding lab results-she did say she missed a couple days of her meds but now back on track with taking them. Sent clinic message to see about resch f/u lab.   She denies c/p, denies palpitations, denies increased sob.   She tried calling in her meds for refills but had trouble with it.   Will get these straight today.  Called pharmacy to order the meds listed below-she will p/u either this afternoon or tomor.   She has not been able to weigh herself due to her scales needs new batteries.   She did get her home device check sent to her sisters house, they did try to deliver but sister was not there-they are suppose to redeliver soon so hopefully she can get it soon.    Meds verified and pill box refilled  Refills needed-  Carvedilol Farxiga Potassium-has enough thru wed AM  [name] has enough thru tues AM   BP 112/72   Pulse 90   Resp 16   SpO2 96%  Weight yesterday-? Last visit [place]  Patient Care Team: Patient, No Pcp Per as PCP - General (General Practice)  Patient Active Problem List   Diagnosis Date Noted   Dilated cardiomyopathy ([place]) 06/04/2015   Symptomatic anemia 06/04/2015   Chest pain 06/04/2015   Abnormal uterine bleeding (AUB) 06/04/2015   Metrorrhagia 06/04/2015   Abdominal pain    Hypokalemia    Menorrhagia    VT (ventricular tachycardia) ([place]) 07/22/2014   SVT (supraventricular tachycardia) ([place]) 03/30/2014   HTN (hypertension) 03/30/2014   Chest pressure- unspecified 02/03/2014   Atrial tachycardia 10/01/2012   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   Hypertensive heart disease with CHF (congestive heart failure) ([place]) 08/04/2012    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 Needs appt for future refills, Disp: 30 tablet, Rfl: 6   ferrous sulfate 325 (65 FE) MG tablet, Take 325 mg by mouth daily with breakfast., Disp: , Rfl:    losartan (COZAAR) 25 MG tablet, Take 1/2 tablet (12.5 mg total) by mouth at bedtime., Disp: 15 tablet, Rfl: 3   Multiple Vitamin (MULTIVITAMIN WITH MINERALS) TABS, Take 1 tablet by mouth daily., Disp: , Rfl:    potassium chloride SA (KLOR-CON M) 20 MEQ tablet, Take 3 tablets (60 mEq total) by mouth 2 (two) times daily., Disp: 180 tablet, Rfl: 3   spironolactone (ALDACTONE) 25 MG tablet, Take 1 tablet (25 mg total) by mouth daily. Hold if systolic blood pressure less than 100., Disp: 15 tablet, Rfl: 3   torsemide (DEMADEX) 20 MG tablet, Take 4 tablets (80 mg total) by mouth in the morning AND 2  tablets (40 mg total) every evening., Disp: 545 tablet, Rfl: 2 No Known Allergies    Social History   Socioeconomic History   Marital status: Single    Spouse name: Not on file   Number of children: 3   Years of education: Not on file   Highest education level: Not on file  Occupational History   Occupation: receptionist    Employer: [employer]   Occupation: office cleaning  Tobacco Use   Smoking status: Never   Smokeless tobacco: Never  Vaping Use   Vaping Use: Never used  Substance and Sexual Activity   Alcohol use: Yes    Alcohol/week: 1.0 standard drink of alcohol    Types: 1 Glasses of wine per week     Comment: daily   Drug use: No   Sexual activity: Yes    Birth control/protection: Condom  Other Topics Concern   Not on file  Social History Narrative   Not on file   Social Determinants of Health   Financial Resource Strain: High Risk (07/02/2022)   Overall Financial Resource Strain (CARDIA)    Difficulty of Paying Living Expenses: Hard  Food Insecurity: No Food Insecurity (07/02/2022)   Hunger Vital Sign    Worried About Running Out of Food in the Last Year: Never true    Ran Out of Food in the Last Year: Never true  Transportation Needs: No Transportation Needs (07/02/2022)   PRAPARE - Hydrologist (Medical): No    Lack of Transportation (Non-Medical): No  Physical Activity: Not on file  Stress: Not on file  Social Connections: Not on file  Intimate Partner Violence: Not on file    Physical Exam      Future Appointments  Date Time Provider [place]  12/22/2022 11:15 AM Deboraha Sprang, MD [facility]  02/11/2023  2:00 PM [facility] DEVICE REMOTES [facility]  03/02/2023 10:00 AM MC ECHO OP 1 [facility]  03/02/2023 11:20 AM Bensimhon, Shaune Pascal, MD [facility] None  05/13/2023  2:00 PM [facility] DEVICE REMOTES [facility]  08/12/2023  2:00 PM [facility] DEVICE REMOTES [facility]  11/11/2023  2:00 PM [facility] DEVICE REMOTES [facility]       [name], [facility] Paramedic  12/04/22

## 2022-12-05 ENCOUNTER — Telehealth (HOSPITAL_COMMUNITY): Payer: Self-pay | Admitting: Surgery

## 2022-12-05 ENCOUNTER — Ambulatory Visit (HOSPITAL_COMMUNITY)
Admission: RE | Admit: 2022-12-05 | Discharge: 2022-12-05 | Disposition: A | Discharge status: Home / Self Care | Payer: Self-pay | Source: Ambulatory Visit | Attending: Internal Medicine | Admitting: Internal Medicine

## 2022-12-05 DIAGNOSIS — I5022 Chronic systolic (congestive) heart failure: Secondary | ICD-10-CM

## 2022-12-05 DIAGNOSIS — E876 Hypokalemia: Secondary | ICD-10-CM

## 2022-12-05 LAB — BASIC METABOLIC PANEL
Anion gap: 9 (ref 5–15)
BUN: 13 mg/dL (ref 6–20)
CO2: 29 mmol/L (ref 22–32)
Calcium: 9.4 mg/dL (ref 8.9–10.3)
Chloride: 99 mmol/L (ref 98–111)
Creatinine, Ser: 1.03 mg/dL — ABNORMAL HIGH (ref 0.44–1.00)
GFR, Estimated: >60 mL/min (ref >60)
Glucose, Bld: 145 mg/dL — ABNORMAL HIGH (ref 70–99)
Potassium: 3.3 mmol/L — ABNORMAL LOW (ref 3.5–5.1)
Sodium: 137 mmol/L (ref 135–145)

## 2022-12-05 NOTE — Progress Notes (Signed)
Remote ICD transmission.   

## 2022-12-05 NOTE — Telephone Encounter (Signed)
-----   Message from Conrad Bieber, NP sent at 12/05/2022 12:43 PM EST ----- Please call. Renal function stable. No change.   K 3.3 . Take extra 40 meq K today. Ensure he is taking 60 meq K twice a day.   Repeat BMET next week.

## 2022-12-05 NOTE — Telephone Encounter (Signed)
  I called patient to review results and recommendations.  She tells me that she was out of Potassium and missed several doses however assures me that it is now refilled and she is taking as prescribed.  She will take extra 40 meq today and is aware to continue 60 meq twice daily.  She will return next Friday for repeat labwork.

## 2022-12-10 ENCOUNTER — Telehealth (HOSPITAL_COMMUNITY): Payer: Self-pay | Admitting: Licensed Clinical Social Worker

## 2022-12-10 NOTE — Telephone Encounter (Signed)
HF Paramedicine Team Based Care Meeting  HF MD- NA  HF NP - [place] NP-C   [facility] admit within the last 30 days for heart failure? no  Medications concerns? Noncompliant with meds- paramedic filling box when she can but pt cancels almost every other week.  Education needs? Further education on importance of compliance  Eligible for discharge? Think she has shown some improvement with compliance but still a far way away- has several things upcoming that will require changes and create concerns for discharging at this time.   [name], LCSW Clinical Social Worker [facility] Desk#: [phone] Cell#: [phone]

## 2022-12-11 ENCOUNTER — Other Ambulatory Visit (HOSPITAL_COMMUNITY): Payer: Self-pay

## 2022-12-11 NOTE — Progress Notes (Signed)
Paramedicine Encounter    Patient ID: Sydney Shaffer, female    DOB: 08/30/1966, 57 y.o.   MRN: [id number]   Pt reports she isnt feeling well this morning-she reports she vomited her food up that she ate earlier today. This does happen on occasion.  She denies fevers. No diarrhea.  She felt dizzy yesterday. She did feel dizzy before she got up this morning. It is unclear if the dizziness starts first then the vomiting or which. She said this happens sometimes, sometimes it gets better quicker than other times and this feels the same in the past.  Pt denies c/p, no increased sob. No palpitations. HR regular, slightly elevated-v/s as noted.  She reports she has not taken meds last night nor this morning.  She reports last night she had vomited also.   Since I filled pill box up last week she has missed 1 AM dose, 3 evening doses and 5 bedtime doses of her meds.  She is due to have labs tomor again, but since she has missed so many doses of her meds I think she needs to resch for next week.  She is going to call to resch.   BP 92/70   Pulse (!) 101   Resp 18   SpO2 98%  Weight yesterday-? Last visit [place]  Patient Care Team: Patient, No Pcp Per as PCP - General (General Practice)  Patient Active Problem List   Diagnosis Date Noted   Dilated cardiomyopathy ([place]) 06/04/2015   Symptomatic anemia 06/04/2015   Chest pain 06/04/2015   Abnormal uterine bleeding (AUB) 06/04/2015   Metrorrhagia 06/04/2015   Abdominal pain    Hypokalemia    Menorrhagia    VT (ventricular tachycardia) ([place]) 07/22/2014   SVT (supraventricular tachycardia) ([place]) 03/30/2014   HTN (hypertension) 03/30/2014   Chest pressure- unspecified 02/03/2014   Atrial tachycardia 10/01/2012   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   Hypertensive heart disease with CHF (congestive heart failure) ([place]) 08/04/2012   Nonischemic dilated cardiomyopathy ([place]) 07/29/2012   Acute on chronic systolic heart failure  ([place]) 05/05/2012   Dyspnea 03/29/2012   Anemia 03/29/2012    Current Outpatient Medications:    amiodarone (PACERONE) 200 MG tablet, Take 1/2 tablet (100 mg total) by mouth daily., Disp: 15 tablet, Rfl: 6   carvedilol (COREG) 3.125 MG tablet, Take 1 tablet (3.125 mg total) by mouth 2 (two) times daily with a meal., Disp: 60 tablet, Rfl: 3   dapagliflozin propanediol (FARXIGA) 10 MG TABS tablet, Take 1 tablet (10 mg total) by mouth daily. Needs appt for future refills, Disp: 30 tablet, Rfl: 6   ferrous sulfate 325 (65 FE) MG tablet, Take 325 mg by mouth daily with breakfast., Disp: , Rfl:    losartan (COZAAR) 25 MG tablet, Take 1/2 tablet (12.5 mg total) by mouth at bedtime., Disp: 15 tablet, Rfl: 3   Multiple Vitamin (MULTIVITAMIN WITH MINERALS) TABS, Take 1 tablet by mouth daily., Disp: , Rfl:    potassium chloride SA (KLOR-CON M) 20 MEQ tablet, Take 3 tablets (60 mEq total) by mouth 2 (two) times daily., Disp: 180 tablet, Rfl: 3   spironolactone (ALDACTONE) 25 MG tablet, Take 1 tablet (25 mg total) by mouth daily. Hold if systolic blood pressure less than 100., Disp: 15 tablet, Rfl: 3   torsemide (DEMADEX) 20 MG tablet, Take 4 tablets (80 mg total) by mouth in the morning AND 2 tablets (40 mg total) every evening., Disp: 545 tablet, Rfl: 2 No Known Allergies    Social  History   Socioeconomic History   Marital status: Single    Spouse name: Not on file   Number of children: 3   Years of education: Not on file   Highest education level: Not on file  Occupational History   Occupation: receptionist    Employer: [employer]   Occupation: office cleaning  Tobacco Use   Smoking status: Never   Smokeless tobacco: Never  Vaping Use   Vaping Use: Never used  Substance and Sexual Activity   Alcohol use: Yes    Alcohol/week: 1.0 standard drink of alcohol    Types: 1 Glasses of wine per week    Comment: daily   Drug use: No   Sexual activity: Yes    Birth control/protection: Condom   Other Topics Concern   Not on file  Social History Narrative   Not on file   Social Determinants of Health   Financial Resource Strain: High Risk (07/02/2022)   Overall Financial Resource Strain (CARDIA)    Difficulty of Paying Living Expenses: Hard  Food Insecurity: No Food Insecurity (07/02/2022)   Hunger Vital Sign    Worried About Running Out of Food in the Last Year: Never true    Ran Out of Food in the Last Year: Never true  Transportation Needs: No Transportation Needs (07/02/2022)   PRAPARE - Hydrologist (Medical): No    Lack of Transportation (Non-Medical): No  Physical Activity: Not on file  Stress: Not on file  Social Connections: Not on file  Intimate Partner Violence: Not on file    Physical Exam      Future Appointments  Date Time Provider [place]  12/12/2022  9:15 AM [facility] LAB [facility] None  12/22/2022 11:15 AM Deboraha Sprang, MD [facility]  02/11/2023  2:00 PM [facility] DEVICE REMOTES [facility]  03/02/2023 10:00 AM MC ECHO OP 1 [facility]  03/02/2023 11:20 AM Bensimhon, Shaune Pascal, MD [facility] None  05/13/2023  2:00 PM [facility] DEVICE REMOTES [facility]  08/12/2023  2:00 PM [facility] DEVICE REMOTES [facility]  11/11/2023  2:00 PM [facility] DEVICE REMOTES [facility]       [name], [facility] Paramedic  12/11/22

## 2022-12-12 ENCOUNTER — Other Ambulatory Visit (HOSPITAL_COMMUNITY): Payer: Self-pay

## 2022-12-18 ENCOUNTER — Other Ambulatory Visit (HOSPITAL_COMMUNITY): Payer: Self-pay

## 2022-12-18 NOTE — Progress Notes (Signed)
Paramedicine Encounter    Patient ID: Sydney Shaffer, female    DOB: 1966-03-08, 57 y.o.   MRN: [id number]  Pt reports still not feeling too well. She is still randomly nauseous.  She thinks its the medicine-she said the night time meds are making her sick-I advised her to take one at a time and space them apart for 1 hr in between and see if she can pinpoint which one it might be.  I also advised her to keep check on her b/p and if her b/p is low that could cause the nausea and then if she takes her meds with an already low b/p it could cause it to lower more and then cause more nausea .  She said her b/p has been over 110 in the mornings but when I took her b/p I asked her to get her home b/p machine so I can compare the readings but she said it was in her car, so I am not convinced she is taking b/p often.  She has not been taking her b/p at night time-she will start checking b/p in evening now also.   She has repeat labs tomor. She has been taking her meds as directed.    Meds verified and pill box refilled.  She has been taking meds better this week.    BP 94/78   Pulse 99   Resp 16   Wt 173 lb (78.5 kg)   SpO2 97%   BMI 26.30 kg/m  Weight yesterday-172 Last visit weight-?  Patient Care Team: Patient, No Pcp Per as PCP - General (General Practice)  Patient Active Problem List   Diagnosis Date Noted   Dilated cardiomyopathy ([place]) 06/04/2015   Symptomatic anemia 06/04/2015   Chest pain 06/04/2015   Abnormal uterine bleeding (AUB) 06/04/2015   Metrorrhagia 06/04/2015   Abdominal pain    Hypokalemia    Menorrhagia    VT (ventricular tachycardia) ([place]) 07/22/2014   SVT (supraventricular tachycardia) ([place]) 03/30/2014   HTN (hypertension) 03/30/2014   Chest pressure- unspecified 02/03/2014   Atrial tachycardia 10/01/2012   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   Hypertensive heart disease with CHF (congestive heart failure) ([place]) 08/04/2012   Nonischemic dilated  cardiomyopathy ([place]) 07/29/2012   Acute on chronic systolic heart failure ([place]) 05/05/2012   Dyspnea 03/29/2012   Anemia 03/29/2012    Current Outpatient Medications:    amiodarone (PACERONE) 200 MG tablet, Take 1/2 tablet (100 mg total) by mouth daily., Disp: 15 tablet, Rfl: 6   carvedilol (COREG) 3.125 MG tablet, Take 1 tablet (3.125 mg total) by mouth 2 (two) times daily with a meal., Disp: 60 tablet, Rfl: 3   dapagliflozin propanediol (FARXIGA) 10 MG TABS tablet, Take 1 tablet (10 mg total) by mouth daily. Needs appt for future refills, Disp: 30 tablet, Rfl: 6   ferrous sulfate 325 (65 FE) MG tablet, Take 325 mg by mouth daily with breakfast., Disp: , Rfl:    losartan (COZAAR) 25 MG tablet, Take 1/2 tablet (12.5 mg total) by mouth at bedtime., Disp: 15 tablet, Rfl: 3   Multiple Vitamin (MULTIVITAMIN WITH MINERALS) TABS, Take 1 tablet by mouth daily., Disp: , Rfl:    potassium chloride SA (KLOR-CON M) 20 MEQ tablet, Take 3 tablets (60 mEq total) by mouth 2 (two) times daily., Disp: 180 tablet, Rfl: 3   spironolactone (ALDACTONE) 25 MG tablet, Take 1 tablet (25 mg total) by mouth daily. Hold if systolic blood pressure less than 100., Disp: 15 tablet, Rfl: 3  torsemide (DEMADEX) 20 MG tablet, Take 4 tablets (80 mg total) by mouth in the morning AND 2 tablets (40 mg total) every evening., Disp: 545 tablet, Rfl: 2 No Known Allergies    Social History   Socioeconomic History   Marital status: Single    Spouse name: Not on file   Number of children: 3   Years of education: Not on file   Highest education level: Not on file  Occupational History   Occupation: receptionist    Employer: [employer]   Occupation: office cleaning  Tobacco Use   Smoking status: Never   Smokeless tobacco: Never  Vaping Use   Vaping Use: Never used  Substance and Sexual Activity   Alcohol use: Yes    Alcohol/week: 1.0 standard drink of alcohol    Types: 1 Glasses of wine per week    Comment: daily    Drug use: No   Sexual activity: Yes    Birth control/protection: Condom  Other Topics Concern   Not on file  Social History Narrative   Not on file   Social Determinants of Health   Financial Resource Strain: High Risk (07/02/2022)   Overall Financial Resource Strain (CARDIA)    Difficulty of Paying Living Expenses: Hard  Food Insecurity: No Food Insecurity (07/02/2022)   Hunger Vital Sign    Worried About Running Out of Food in the Last Year: Never true    Ran Out of Food in the Last Year: Never true  Transportation Needs: No Transportation Needs (07/02/2022)   PRAPARE - Hydrologist (Medical): No    Lack of Transportation (Non-Medical): No  Physical Activity: Not on file  Stress: Not on file  Social Connections: Not on file  Intimate Partner Violence: Not on file    Physical Exam      Future Appointments  Date Time Provider [place]  12/19/2022 12:00 PM [facility] None  01/07/2023 11:15 AM Deboraha Sprang, MD [facility]  02/11/2023  2:00 PM [facility] DEVICE REMOTES [facility]  03/02/2023 10:00 AM MC ECHO OP 1 [facility]  03/02/2023 11:20 AM Bensimhon, Shaune Pascal, MD [facility] None  05/13/2023  2:00 PM [facility] DEVICE REMOTES [facility]  08/12/2023  2:00 PM [facility] DEVICE REMOTES [facility]  11/11/2023  2:00 PM [facility] DEVICE REMOTES [facility]       [name], [facility] Paramedic  12/18/22

## 2022-12-19 ENCOUNTER — Ambulatory Visit (HOSPITAL_COMMUNITY)
Admission: RE | Admit: 2022-12-19 | Discharge: 2022-12-19 | Disposition: A | Discharge status: Home / Self Care | Payer: BC Managed Care – PPO | Source: Ambulatory Visit | Attending: Cardiology | Admitting: Cardiology

## 2022-12-19 DIAGNOSIS — I5022 Chronic systolic (congestive) heart failure: Secondary | ICD-10-CM | POA: Insufficient documentation

## 2022-12-19 DIAGNOSIS — E876 Hypokalemia: Secondary | ICD-10-CM | POA: Diagnosis not present

## 2022-12-19 LAB — BASIC METABOLIC PANEL
Anion gap: 10 (ref 5–15)
BUN: 18 mg/dL (ref 6–20)
CO2: 27 mmol/L (ref 22–32)
Calcium: 9.6 mg/dL (ref 8.9–10.3)
Chloride: 99 mmol/L (ref 98–111)
Creatinine, Ser: 1.14 mg/dL — ABNORMAL HIGH (ref 0.44–1.00)
GFR, Estimated: 56 mL/min — ABNORMAL LOW (ref >60)
Glucose, Bld: 185 mg/dL — ABNORMAL HIGH (ref 70–99)
Potassium: 4 mmol/L (ref 3.5–5.1)
Sodium: 136 mmol/L (ref 135–145)

## 2022-12-22 ENCOUNTER — Encounter: Payer: Self-pay | Admitting: Internal Medicine

## 2023-01-01 ENCOUNTER — Telehealth (HOSPITAL_COMMUNITY): Payer: Self-pay

## 2023-01-01 NOTE — Telephone Encounter (Signed)
I had to resch our appointment today, I had gotten stuck on a 911 call that took much longer than expected to clear from and she was unable to stay around to wait b/c she had another appointment she had to be at.  So will see her next week.   [name], [place] 01/01/2023

## 2023-01-07 ENCOUNTER — Ambulatory Visit: Payer: BC Managed Care – PPO | Attending: Internal Medicine | Admitting: Internal Medicine

## 2023-01-07 ENCOUNTER — Encounter: Payer: Self-pay | Admitting: Internal Medicine

## 2023-01-07 ENCOUNTER — Other Ambulatory Visit (HOSPITAL_COMMUNITY): Payer: Self-pay

## 2023-01-07 VITALS — BP 114/68 | HR 102 | Ht 68.0 in | Wt 181.2 lb

## 2023-01-07 DIAGNOSIS — I5022 Chronic systolic (congestive) heart failure: Secondary | ICD-10-CM | POA: Diagnosis not present

## 2023-01-07 DIAGNOSIS — I420 Dilated cardiomyopathy: Secondary | ICD-10-CM

## 2023-01-07 DIAGNOSIS — Z79899 Other long term (current) drug therapy: Secondary | ICD-10-CM

## 2023-01-07 DIAGNOSIS — Z95810 Presence of automatic (implantable) cardiac defibrillator: Secondary | ICD-10-CM | POA: Diagnosis not present

## 2023-01-07 DIAGNOSIS — I4719 Other supraventricular tachycardia: Secondary | ICD-10-CM | POA: Diagnosis not present

## 2023-01-07 DIAGNOSIS — R058 Other specified cough: Secondary | ICD-10-CM

## 2023-01-07 MED ORDER — CARVEDILOL 6.25 MG PO TABS
6.2500 mg | ORAL_TABLET | Freq: Two times a day (BID) | ORAL | 3 refills | Status: DC
  Filled 2023-01-07: qty 180, 90d supply, fill #0
  Filled 2023-04-22: qty 60, 30d supply, fill #1

## 2023-01-07 NOTE — Progress Notes (Signed)
Patient Care Team: Patient, No Pcp Per as PCP - General (General Practice)   HPI  Sydney Shaffer is a 57 y.o. female in follow-up for an ICD Medtronic  implant initially 2013 for primary prevention in the setting of nonischemic cardiomyopathy.  GEN change 12/20  She has had SVT and underwent ablation of the septal atrial tachycardia as well as slow pathway.  Recurrent atrial arrhythmias 2017 and ventricular tachycardia treated with amiodarone.  Has been maintained on amiodarone.  Two interval episodes of VT NS    The patient denies chest pain, orthopnea or peripheral edema or nocturnal dyspnea.  There have been no palpitations, lightheadedness or syncope.  Complains of shortness of breath and fatigue..   Patient denies symptoms of GI intolerance, sun sensitivity, neurological symptoms attributable to amiodarone.  Has a cough that dates back to June/July.  X-rays were reviewed.  She had a right lateral pneumonia early July not evident and not recorded at the end of July but cough has persisted.  Mildly productive. 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    Chronic systolic heart failure (HCC)    Dysrhythmia    Bradycardia   Hypertension    Hypertensive heart disease  with CHF ([place])    ICD (implantable cardiac defibrillator) in place 08/13/2012    Past Surgical History:  Procedure Laterality Date   APPENDECTOMY     CARDIAC CATHETERIZATION  April 2013   normal coronaries   CARDIAC CATHETERIZATION [name] 09/12/2016   Procedure: Right/Left Heart Cath and Coronary Angiography;  Surgeon: Jolaine Artist, MD;  Location: [facility];  Service: Cardiovascular;  Laterality: [name];   DILITATION & CURRETTAGE/HYSTROSCOPY WITH VERSAPOINT RESECTION [name] 10/05/2015   Procedure: DILATATION & CURETTAGE/HYSTEROSCOPY WITH VERSAPOINT RESECTION;  Surgeon: [name], MD;  Location: [facility];  Service: Gynecology;  Laterality: [name];   EP study and ablation  01/07/13   Ablation of AVNRT and atrial tachycardia (arising from the anteroseptal RA 42m above the HIS)   ICD  08/13/2012   ICD GENERATOR CHANGEOUT [name] 11/14/2019   Procedure: ICD [place]  Surgeon: KDeboraha Sprang MD;  [facility];  Service: Cardiovascular;  Laterality: [name];   IMPLANTABLE CARDIOVERTER DEFIBRILLATOR IMPLANT [name] 08/13/2012   Procedure: IMPLANTABLE CARDIOVERTER DEFIBRILLATOR IMPLANT;  Surgeon: SDeboraha Sprang  MD;  Location: [facility];  Service: Cardiovascular;  Laterality: [name];   LEFT HEART CATHETERIZATION WITH CORONARY ANGIOGRAM [name] 04/02/2012   Procedure: LEFT HEART CATHETERIZATION WITH CORONARY ANGIOGRAM;  Surgeon: Peter M Martinique, MD;  Location[facility] C[facility] LAB;  Service: Cardiovascular;  Laterality: [name];   SUPRAVENTRICULAR TACHYCARDIA ABLATION [name] 01/07/2013   Procedure: SUPRAVENTRICULAR TACHYCARDIA ABLATION;  Surgeon: Thompson Grayer, MD;  Location: [facility] CATH LAB;  Service: Cardiovascular;  Laterality: [name];   TUBAL LIGATION      Current Meds  Medication Sig   amiodarone (PACERONE) 200 MG tablet Take 1/2 tablet (100 mg total) by mouth daily.   carvedilol (COREG) 3.125 MG tablet Take 1 tablet (3.125 mg total) by mouth 2 (two) times daily with a meal.   dapagliflozin  propanediol (FARXIGA) 10 MG TABS tablet Take 1 tablet (10 mg total) by mouth daily. Needs appt for future refills   ferrous sulfate 325 (65 FE) MG tablet Take 325 mg by mouth daily with breakfast.   losartan (COZAAR) 25 MG tablet Take 1/2 tablet (12.5 mg total) by mouth at bedtime.   Multiple Vitamin (MULTIVITAMIN WITH MINERALS) TABS Take 1 tablet by mouth daily.   potassium chloride SA (KLOR-CON M) 20 MEQ tablet Take 3 tablets (60 mEq total) by mouth 2 (two) times daily.   spironolactone (ALDACTONE) 25 MG tablet Take 1 tablet (25 mg total) by mouth daily. Hold if systolic blood pressure less than 100.   torsemide (DEMADEX) 20 MG tablet Take 4 tablets (80 mg total) by mouth in the morning AND 2 tablets (40 mg total) every evening.    No Known Allergies    Review of Systems negative except from HPI and PMH  Physical Exam BP 114/68   Pulse (!) 102   Ht '5\' 8"'$  (1.727 m)   Wt 181 lb 3.2 oz (82.2 kg)   SpO2 99%   BMI 27.55 kg/m  Well developed and well nourished in no acute distress HENT normal Neck supple with JVP-flat Clear Device pocket well healed; without hematoma or erythema.  There is no tethering  Regular rate and rhythm, no  gallop No  murmur Abd-soft with active BS No Clubbing cyanosis  edema Skin-warm and dry A & Oriented  Grossly normal sensory and motor function  ECG sinus @ 97 18/10/38  Device function is normal. Programming changes   See Paceart for details    Estimated Creatinine Clearance: 61.2 mL/min (A) (by C-G formula based on SCr of 1.14 mg/dL (H)).   Assessment and  Plan  Nonischemic cardiomyopathy   Atrial Tachycardia/Flutter (CL310)   ICD Medtronic     CHF chronic systolic  High Risk Medication Surveillance amiodarone  Sinus tachycardia  Cough Sinus tachycardia symptoms been ongoing issue.  According to the [facility], her dose was decreased to 7/23.  Dr. DM had seen her in consultation for acute on chronic heart failure his recommendation was to  continue her on 12.5.  We will increase her from 3.25 back to 6.25 every to Dr. Reine Just also to consider the role of ivabradine.  Recurrent nonsustained ventricular tachycardia.  This may be further abetted by higher doses of beta-blockers; continue her on the amiodarone.  Surveillance laboratories are indicated.  I am bothered by her cough, not withstanding the fact that it is temporally related to her pneumonia, these interval x-ray suggested that the pneumonia was resolved and there were some comments about chronic changes.  While her dose is low, she still is at risk for amiodarone lung  injury and can often be heralded by cough.  Will get a high-resolution CT scan.

## 2023-01-07 NOTE — Patient Instructions (Addendum)
Medication Instructions:  Your physician has recommended you make the following change in your medication:   ** Stop Carvedilol 3.'125mg'$   ** Begin Carvedilol 6.'25mg'$  - 1 tablet by mouth twice daily.   *If you need a refill on your cardiac medications before your next appointment, please call your pharmacy*   Lab Work: TSH and Liver Panel today  If you have labs (blood work) drawn today and your tests are completely normal, you will receive your results only by: MyChart Message (if you have MyChart) OR A paper copy in the mail If you have any lab test that is abnormal or we need to change your treatment, we will call you to review the results.   Testing/Procedures: Non-Cardiac CT scanning, (CAT scanning), is a noninvasive, special x-ray that produces cross-sectional images of the body using x-rays and a computer. CT scans help physicians diagnose and treat medical conditions. For some CT exams, a contrast material is used to enhance visibility in the area of the body being studied. CT scans provide greater clarity and reveal more details than regular x-ray exams.     Follow-Up: At [facility], you and your health needs are our priority.  As part of our continuing mission to provide you with exceptional heart care, we have created designated Provider Care Teams.  These Care Teams include your primary Cardiologist (physician) and Advanced Practice Providers (APPs -  Physician Assistants and Nurse Practitioners) who all work together to provide you with the care you need, when you need it.  We recommend signing up for the patient portal called "MyChart".  Sign up information is provided on this After Visit Summary.  MyChart is used to connect with patients for Virtual Visits (Telemedicine).  Patients are able to view lab/test results, encounter notes, upcoming appointments, etc.  Non-urgent messages can be sent to your provider as well.   To learn more about what you can do with  MyChart, go to [email or web address].    Your next appointment:   6 months with Dr Olin Pia PA.

## 2023-01-08 ENCOUNTER — Other Ambulatory Visit (HOSPITAL_COMMUNITY): Payer: Self-pay

## 2023-01-08 LAB — TSH: TSH: 4.11 u[IU]/mL (ref 0.450–4.500)

## 2023-01-08 LAB — HEPATIC FUNCTION PANEL
ALT: 27 IU/L (ref 0–32)
AST: 27 IU/L (ref 0–40)
Albumin: 4.2 g/dL (ref 3.8–4.9)
Alkaline Phosphatase: 176 IU/L — ABNORMAL HIGH (ref 44–121)
Bilirubin Total: 1.2 mg/dL (ref 0.0–1.2)
Bilirubin, Direct: 0.34 mg/dL (ref 0.00–0.40)
Total Protein: 7.9 g/dL (ref 6.0–8.5)

## 2023-01-08 NOTE — Progress Notes (Signed)
Paramedicine Encounter    Patient ID: Sydney Shaffer, female    DOB: 11-10-1966, 57 y.o.   MRN: [id number]   Complaints-symptoms improved   Edema-denies   Compliance with meds-yes   Pill box filled-yes If so, by whom-paramedic   Refills needed-losartan      Pt reports she has been feeling better.  She reports the nausea and dizziness has improved since keeping track of her b/p more and limiting her b/p meds if b/p is less than 100.  I think she wasn't taking her b/p often and taking her meds when it was lower than 100 and thus causing her to feel worse.  Pt denies increased sob.   Meds verified and pill box refilled.  She reports this has to be a quick visit due to her scheduling of some job interviews today.   She seen dr Caryl Comes yesterday and her carvedilol was increased to 6.'25mg'$ .  He also wants her to get a CT scan done due to this ongoing cough--which I was not made aware of--to check for amio lung injury.  Will continue to f/u.   Pt said numerous times this visit that she is very thankful for paramedicine services and the help this has provided and "would be a hot mess without" me coming to assist with her meds/health.     BP 98/76   Pulse 98   Resp 16   SpO2 97%  Weight yesterday-180 @ doc office  Last visit weight-173  Patient Care Team: Patient, No Pcp Per as PCP - General (General Practice)  Patient Active Problem List   Diagnosis Date Noted   Dilated cardiomyopathy ([place]) 06/04/2015   Symptomatic anemia 06/04/2015   Chest pain 06/04/2015   Abnormal uterine bleeding (AUB) 06/04/2015   Metrorrhagia 06/04/2015   Abdominal pain    Hypokalemia    Menorrhagia    VT (ventricular tachycardia) ([place]) 07/22/2014   SVT (supraventricular tachycardia) ([place]) 03/30/2014   HTN (hypertension) 03/30/2014   Chest pressure- unspecified 02/03/2014   Atrial tachycardia 10/01/2012   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   Hypertensive heart disease with CHF  (congestive heart failure) ([place]) 08/04/2012   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 Needs appt for future refills, Disp: 30 tablet, Rfl: 6   ferrous sulfate 325 (65 FE) MG tablet, Take 325 mg by mouth daily with breakfast., Disp: , Rfl:    losartan (COZAAR) 25 MG tablet, Take 1/2 tablet (12.5 mg total) by mouth at bedtime., Disp: 15 tablet, Rfl: 3   Multiple Vitamin (MULTIVITAMIN WITH MINERALS) TABS, Take 1 tablet by mouth daily., Disp: , Rfl:    potassium chloride SA (KLOR-CON M) 20 MEQ tablet, Take 3 tablets (60 mEq total) by mouth 2 (two) times daily., Disp: 180 tablet, Rfl: 3   spironolactone (ALDACTONE) 25 MG tablet, Take 1 tablet (25 mg total) by mouth daily. Hold if systolic blood pressure less than 100., Disp: 15 tablet, Rfl: 3   torsemide (DEMADEX) 20 MG tablet, Take 4 tablets (80 mg total)  by mouth in the morning AND 2 tablets (40 mg total) every evening., Disp: 545 tablet, Rfl: 2 No Known Allergies    Social History   Socioeconomic History   Marital status: Single    Spouse name: Not on file   Number of children: 3   Years of education: Not on file   Highest education level: Not on file  Occupational History   Occupation: receptionist    Employer: [employer]   Occupation: office cleaning  Tobacco Use   Smoking status: Never   Smokeless tobacco: Never  Vaping Use   Vaping Use: Never used  Substance and Sexual Activity   Alcohol use: Yes    Alcohol/week: 1.0 standard drink of alcohol     Types: 1 Glasses of wine per week    Comment: daily   Drug use: No   Sexual activity: Yes    Birth control/protection: Condom  Other Topics Concern   Not on file  Social History Narrative   Not on file   Social Determinants of Health   Financial Resource Strain: High Risk (07/02/2022)   Overall Financial Resource Strain (CARDIA)    Difficulty of Paying Living Expenses: Hard  Food Insecurity: No Food Insecurity (07/02/2022)   Hunger Vital Sign    Worried About Running Out of Food in the Last Year: Never true    Ran Out of Food in the Last Year: Never true  Transportation Needs: No Transportation Needs (07/02/2022)   PRAPARE - Hydrologist (Medical): No    Lack of Transportation (Non-Medical): No  Physical Activity: Not on file  Stress: Not on file  Social Connections: Not on file  Intimate Partner Violence: Not on file    Physical Exam      Future Appointments  Date Time Provider [place]  01/22/2023  2:00 PM MC-CT 2 MC-CT [facility]  02/11/2023  2:00 PM [facility] DEVICE REMOTES [facility]  03/02/2023 10:00 AM MC ECHO OP 1 [facility]  03/02/2023 11:20 AM Bensimhon, Shaune Pascal, MD [facility] None  05/13/2023  2:00 PM [facility] DEVICE REMOTES [facility]  08/12/2023  2:00 PM [facility] DEVICE REMOTES [facility]  11/11/2023  2:00 PM [facility] DEVICE REMOTES [facility]       [name], [place] Paramedic  01/08/23

## 2023-01-15 ENCOUNTER — Telehealth (HOSPITAL_COMMUNITY): Payer: Self-pay

## 2023-01-15 NOTE — Telephone Encounter (Signed)
Pt had reached out to me the other day reporting she may not be avail for 03-09-2023 appointment due to death in family and things are chaotic at this time in the home, but she would let me know the next day.  I never heard back on Wednesday- so I reached out multiple times this morning prior to our appointment time but no responses- I sent her text that said I would not be coming out unless she was able to confirm with me first.   I never heard anything back, will f/u next week.   [name], [place] 01/15/2023

## 2023-01-21 ENCOUNTER — Encounter: Payer: Self-pay | Admitting: Internal Medicine

## 2023-01-22 ENCOUNTER — Telehealth (HOSPITAL_COMMUNITY): Payer: Self-pay

## 2023-01-22 ENCOUNTER — Ambulatory Visit (HOSPITAL_COMMUNITY)
Admission: RE | Admit: 2023-01-22 | Discharge: 2023-01-22 | Disposition: A | Discharge status: Home / Self Care | Payer: BC Managed Care – PPO | Source: Ambulatory Visit | Attending: Internal Medicine | Admitting: Internal Medicine

## 2023-01-22 ENCOUNTER — Telehealth: Payer: Self-pay

## 2023-01-22 DIAGNOSIS — R748 Abnormal levels of other serum enzymes: Secondary | ICD-10-CM

## 2023-01-22 DIAGNOSIS — R058 Other specified cough: Secondary | ICD-10-CM | POA: Diagnosis present

## 2023-01-22 DIAGNOSIS — Z79899 Other long term (current) drug therapy: Secondary | ICD-10-CM

## 2023-01-22 NOTE — Telephone Encounter (Signed)
Spoke with Sydney Shaffer and advised of lab result and recommendation per Dr Caryl Comes.  Sydney Shaffer verbalizes understanding and lab appointment scheduled for 04/22/2023.  Sydney Shaffer thanked Therapist, sports for the call.

## 2023-01-22 NOTE — Telephone Encounter (Signed)
Reached out to pt again to verify if Thursday is ok for a home visit.  I did not get a response.   I told her last week I was going go assume the usual time for Thursday is still good unless I hear back from her.   Sydney Shaffer, [place] 01/19/2023

## 2023-01-22 NOTE — Telephone Encounter (Signed)
As of today I still have not heard back to confirm today's visit.   Will f/u next week.   [name], [place] 01/22/2023

## 2023-01-22 NOTE — Telephone Encounter (Signed)
-----   Message from Deboraha Sprang, MD sent at 01/16/2023  9:13 AM EST ----- Please Inform Patient  Labs are normal x mild elevation of alk Phos which is new-- may be 2/2 amiodarone so can we recheck in 3 m  Thanks

## 2023-01-29 ENCOUNTER — Other Ambulatory Visit (HOSPITAL_COMMUNITY): Payer: Self-pay

## 2023-01-29 NOTE — Progress Notes (Signed)
Paramedicine Encounter    Patient ID: Sydney Shaffer, female    DOB: [birth year], 57 y.o.   MRN: [id number]   Complaints-tired/sleepy   Edema-none  Compliance with meds-?  Pill box filled-yes If so, by whom-paramedic   Refills needed-losartan   Pt reports she has gotten off track with her meds due to the family member death. She did not get her refills from pharmacy.  She reports feeling roller coaster of emotions. She thinks she feels depressed as well.  She feels tired/sleepy a lot.  She denies increased sob. No dizziness.   During our missed weekly visits she matches up the pills to fill her pill box.   She did get her device transmitter finally. Now she is reporting issues with that its not working right and she tried calling to trouble shoot it.    She has not been checking her b/p.   She reports not missing any doses of her meds since I seen her last but pill bottles dictate otherwise. So I question true consistency with her meds.    B/p as noted-she took her meds around 9am and its now 1130.  I encouraged her to be sure to take her b/p before her meds like she used to do. She had gotten at a point that she was feeling good. But now she is back to feeling bad. I advised her if her b/p is less than 100 then she doesn't take the carvedilol and to be sure to check it at bedtime too before taking her meds or if she does and her b/p is low then she is going to feel crummy.     BP 90/64   Pulse 100   Resp 18   SpO2 96%  Weight yesterday--182-on Monday  Last visit weight-180  Patient Care Team: Patient, No Pcp Per as PCP - General (General Practice)  Patient Active Problem List   Diagnosis Date Noted   Dilated cardiomyopathy ([place]) 06/04/2015   Symptomatic anemia 06/04/2015   Chest pain 06/04/2015   Abnormal uterine bleeding (AUB) 06/04/2015   Metrorrhagia 06/04/2015   Abdominal pain    Hypokalemia    Menorrhagia    VT (ventricular tachycardia) ([place]) 07/22/2014    SVT (supraventricular tachycardia) ([place]) 03/30/2014   HTN (hypertension) 03/30/2014   Chest pressure- unspecified 02/03/2014   Atrial tachycardia 10/01/2012   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   Hypertensive heart disease with CHF (congestive heart failure) ([place]) 08/04/2012   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 Needs appt for future refills, Disp: 30 tablet, Rfl: 6   ferrous sulfate 325 (65 FE) MG tablet, Take 325 mg by mouth daily with breakfast., Disp: , Rfl:    losartan (COZAAR) 25 MG tablet, Take 1/2 tablet (12.5 mg total) by mouth at bedtime., Disp: 15 tablet, Rfl: 3   Multiple Vitamin (MULTIVITAMIN WITH MINERALS) TABS, Take 1 tablet by mouth daily., Disp: , Rfl:    potassium chloride SA (KLOR-CON M) 20 MEQ tablet, Take 3 tablets (60 mEq total)  by mouth 2 (two) times daily., Disp: 180 tablet, Rfl: 3   spironolactone (ALDACTONE) 25 MG tablet, Take 1 tablet (25 mg total) by mouth daily. Hold if systolic blood pressure less than 100., Disp: 15 tablet, Rfl: 3   torsemide (DEMADEX) 20 MG tablet, Take 4 tablets (80 mg total) by mouth in the morning AND 2 tablets (40 mg total) every evening., Disp: 545 tablet, Rfl: 2 No Known Allergies    Social History   Socioeconomic History   Marital status: Single    Spouse name: Not on file   Number of children: 3   Years of education: Not on file   Highest education level: Not on file  Occupational History   Occupation: receptionist    Employer: [employer]   Occupation: office cleaning  Tobacco Use   Smoking status: Never   Smokeless tobacco: Never  Vaping Use   Vaping Use: Never used  Substance and Sexual Activity   Alcohol use: Yes    Alcohol/week: 1.0 standard drink of alcohol    Types: 1 Glasses of wine per week    Comment: daily   Drug use: No   Sexual activity: Yes    Birth control/protection: Condom  Other Topics Concern   Not on file  Social History Narrative   Not on file   Social Determinants of Health   Financial Resource Strain: High Risk (07/02/2022)   Overall Financial Resource Strain (CARDIA)    Difficulty of Paying Living Expenses: Hard  Food Insecurity: No Food Insecurity (07/02/2022)   Hunger Vital Sign    Worried About Running Out of Food in the Last Year: Never true    Ran Out of Food in the Last Year: Never true  Transportation Needs: No Transportation Needs (07/02/2022)   PRAPARE - Hydrologist (Medical): No    Lack of Transportation (Non-Medical): No  Physical Activity: Not on file  Stress: Not on file  Social Connections: Not on file  Intimate Partner Violence: Not on file    Physical Exam      Future Appointments  Date Time Provider [place]  02/11/2023  2:00 PM [facility] DEVICE REMOTES [facility]  03/02/2023 10:00 AM [facility] ECHO OP 1 [facility]  03/02/2023 11:20 AM Bensimhon, Shaune Pascal, MD [facility] None  04/22/2023  4:15 PM [facility] LAB [facility]  05/13/2023  2:00 PM [facility] DEVICE REMOTES [facility]  05/14/2023  3:20 PM Minette Brine, FNP [facility] None  08/12/2023  2:00 PM [facility] DEVICE REMOTES [facility]  11/11/2023  2:00 PM [facility] DEVICE REMOTES [facility]       [name], Paramedic [place] Paramedic  01/29/23

## 2023-02-05 ENCOUNTER — Telehealth (HOSPITAL_COMMUNITY): Payer: Self-pay | Admitting: Licensed Clinical Social Worker

## 2023-02-05 ENCOUNTER — Other Ambulatory Visit (HOSPITAL_COMMUNITY): Payer: Self-pay

## 2023-02-05 ENCOUNTER — Other Ambulatory Visit (HOSPITAL_COMMUNITY): Payer: Self-pay | Admitting: Family Medicine

## 2023-02-05 MED ORDER — SPIRONOLACTONE 25 MG PO TABS
25.0000 mg | ORAL_TABLET | Freq: Every day | ORAL | 3 refills | Status: DC
  Filled 2023-02-05: qty 45, 45d supply, fill #0
  Filled 2023-04-22: qty 45, 45d supply, fill #1

## 2023-02-05 NOTE — Progress Notes (Signed)
Paramedicine Encounter    Patient ID: Sydney Shaffer, female    DOB: Mar 20, 1966, 57 y.o.   MRN: [id number]   Complaints-overwhelmed   Edema-no  Compliance with meds-somewhat -yes  Pill box filled-yes If so, by whom-paramedic   Refills needed- Losartan  Spironolactone  Torsemide-she has it through sat am/pm dose.   $28.69  for the losartan and torsemide Pharm had to send request to provider on the spiro so unsure how much that will cost-she can't give me price until she gets the rx back    Iron OTC  Meds verified and pill box refilled.  Pt reports she has not been able to get her meds refilled due to the lack of funds right now.    Pt reports she is overwhelmed with things right now. Unemployed for past few months. Been going to job interviews but difficult to find something that she can tolerate.  She is behind on her rent,  2 months- her rent is $700 a month.  she did go down to social services and they gave her paperwork to fill out for assistance but that was for [employer] assistance. Her power got turned off but she got them back on yesterday.  She did apply for food stamps and she got approved.   She denies increased sob, no c/p, she has been having some dizziness recently. She reports it comes and goes and positional.  Has not been taking her b/p at home.    Will relay these social issues to [place].    She did take her meds this morning around 7am.   BP 92/70   Pulse 92   Resp 18   Wt 179 lb (81.2 kg)   SpO2 98%   BMI 27.22 kg/m  B/p standing-90/systolic  Weight 123XX123 Last visit [facility]  Patient Care Team: Patient, No Pcp Per as PCP - General (General Practice)  Patient Active Problem List   Diagnosis Date Noted   Dilated cardiomyopathy ([place]) 06/04/2015   Symptomatic anemia 06/04/2015   Chest pain 06/04/2015   Abnormal uterine bleeding (AUB) 06/04/2015   Metrorrhagia 06/04/2015   Abdominal pain    Hypokalemia    Menorrhagia    VT  (ventricular tachycardia) ([place]) 07/22/2014   SVT (supraventricular tachycardia) ([place]) 03/30/2014   HTN (hypertension) 03/30/2014   Chest pressure- unspecified 02/03/2014   Atrial tachycardia 10/01/2012   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   Hypertensive heart disease with CHF (congestive heart failure) ([place]) 08/04/2012   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 Needs appt for future refills, Disp: 30 tablet, Rfl: 6   losartan (COZAAR) 25 MG tablet, Take 1/2 tablet (12.5 mg total) by mouth at bedtime., Disp: 15 tablet, Rfl: 3   Multiple Vitamin (MULTIVITAMIN WITH MINERALS) TABS, Take 1 tablet by mouth daily., Disp: , Rfl:    potassium chloride SA (KLOR-CON M) 20 MEQ tablet, Take 3 tablets (60 mEq total) by mouth 2 (two) times daily., Disp: 180 tablet, Rfl: 3   torsemide (DEMADEX) 20 MG  tablet, Take 4 tablets (80 mg total) by mouth in the morning AND 2 tablets (40 mg total) every evening., Disp: 545 tablet, Rfl: 2   ferrous sulfate 325 (65 FE) MG tablet, Take 325 mg by mouth daily with breakfast., Disp: , Rfl:    spironolactone (ALDACTONE) 25 MG tablet, Take 1 tablet (25 mg total) by mouth daily. Hold if systolic blood pressure less than 100., Disp: 45 tablet, Rfl: 3 No Known Allergies    Social History   Socioeconomic History   Marital status: Single    Spouse name: Not on file   Number of children: 3   Years of education: Not on file   Highest education level: Not on file  Occupational History    Occupation: receptionist    Employer: [employer]   Occupation: office cleaning  Tobacco Use   Smoking status: Never   Smokeless tobacco: Never  Vaping Use   Vaping Use: Never used  Substance and Sexual Activity   Alcohol use: Yes    Alcohol/week: 1.0 standard drink of alcohol    Types: 1 Glasses of wine per week    Comment: daily   Drug use: No   Sexual activity: Yes    Birth control/protection: Condom  Other Topics Concern   Not on file  Social History Narrative   Not on file   Social Determinants of Health   Financial Resource Strain: High Risk (02/05/2023)   Overall Financial Resource Strain (CARDIA)    Difficulty of Paying Living Expenses: Very hard  Food Insecurity: No Food Insecurity (07/02/2022)   Hunger Vital Sign    Worried About Running Out of Food in the Last Year: Never true    Ran Out of Food in the Last Year: Never true  Transportation Needs: No Transportation Needs (07/02/2022)   PRAPARE - Hydrologist (Medical): No    Lack of Transportation (Non-Medical): No  Physical Activity: Not on file  Stress: Not on file  Social Connections: Not on file  Intimate Partner Violence: Not on file    Physical Exam      Future Appointments  Date Time Provider [place]  02/11/2023  2:00 PM [facility] DEVICE REMOTES [facility]  03/02/2023 10:00 AM MC ECHO OP 1 [facility]  03/02/2023 11:20 AM Bensimhon, Shaune Pascal, MD MC-[facility] None  04/22/2023  4:15 PM [facility] LAB [facility]  05/13/2023  2:00 PM [facility] DEVICE REMOTES [facility]  05/14/2023  3:20 PM Minette Brine, FNP [facility] None  08/12/2023  2:00 PM [facility] DEVICE REMOTES [facility]  11/11/2023  2:00 PM [facility] DEVICE REMOTES [facility]       [name], Paramedic [place] Paramedic  02/05/23

## 2023-02-05 NOTE — Progress Notes (Signed)
Heart and Vascular Care Navigation  02/05/2023  Keywanna Wenig [birth year] [id number]  Reason for Referral: financial concerns   Engaged with patient by telephone for follow up visit for Heart and Vascular Care Coordination.                                                                                                   Assessment:  CSW called pt to discuss current financial concerns.  Pt was laid off in December of 2023 and has not been working since.  States she was given info on how to apply for unemployment benefits but she hit multiple road blocks with getting assistance in the application process and has not been able to complete- this benefit would entitle her to $300 a week if approved.  Pt actively applying for jobs but has not found any she can do with her current physical limitations.  Pt currently 2 months behind on rent and will be 3 months behind in the beginning of March- she will then owe $2,100 (pays $700/month).  Landlord gave her 10 day notice stating if she does not pay in 10 day and  Also states she owes a balance on her [employer] bill of about $168.    Pt planning to go to [facility] to apply for crisis assistance and LIEAP program but has not done so yet.  CSW considering for patient care fund assistance but without plan for payment moving forward not sure if we will be able to assist especially when we cannot pay $1,100 of the balance which she will still be responsible for.  CSW asked permission to speak with pt landlord to discuss situation and see what we might be able to figure out- awaiting pt to return call to provide number.         Pt also unable to afford medication copays- CSW awaiting word from paramedic with amount owed so we can determine how to assist.                              HRT/VAS Care Coordination     Outpatient Care Team Community Paramedicine; Social Worker   Community Paramedic Name: Renee Ramus [phone]   Social Worker Name: Tammy Sours, [facility], [phone]   Living arrangements for the past 2 months Apartment   Lives with: Self   Patient Current Insurance Coverage Self-Pay   Patient Has Concern With Paying Medical Bills No   Does Patient Have Prescription Coverage? No   Patient Prescription Assistance Programs Heart Failure [facility] Assistive Devices/Equipment --  has heart monitor       Social History:  SDOH Screenings   Food Insecurity: No Food Insecurity (07/02/2022)  Housing: Low Risk  (07/02/2022)  Transportation Needs: No Transportation Needs (07/02/2022)  Depression (PHQ2-9): Medium Risk (10/01/2022)  Financial Resource Strain: High Risk (02/05/2023)  Tobacco Use: Low Risk  (01/07/2023)    SDOH Interventions: Financial Resources:  Financial Strain Interventions: Other (Comment) (discussed financial assistance options) Has attempted to apply for unemployment  Food Insecurity:  Not discussed  Housing Insecurity:  Provided 10 day notice from apartment so pending eviction  Transportation:   Not discussed    Follow-up plan:    CSW will follow and assist as able  [name], [place] [facility] Desk#: [phone] Cell#: [phone]

## 2023-02-06 ENCOUNTER — Telehealth (HOSPITAL_COMMUNITY): Payer: Self-pay | Admitting: Licensed Clinical Social Worker

## 2023-02-06 NOTE — Telephone Encounter (Unsigned)
H&V Care Navigation CSW Progress Note  Clinical Social Worker received return call from pt.  She provided CSW with management company number- CSW left VM to discuss possible assistance.  Pt has $38 in copays for meds- will come by clinic to pick up gift cards to help with cost.   [place]: No Food Insecurity (07/02/2022)  Housing: Low Risk  (07/02/2022)  Transportation Needs: No Transportation Needs (07/02/2022)  Depression (PHQ2-9): Medium Risk (10/01/2022)  Financial Resource Strain: High Risk (02/05/2023)  Tobacco Use: Low Risk  (01/07/2023)    Sydney Shaffer, [place] [facility] Desk#: [phone] Cell#: [phone]

## 2023-02-06 NOTE — Telephone Encounter (Signed)
CSW called pt to follow up on conversation and SDOH needs discussed yesterday- unable to reach- left VM requesting return call.  [name], LCSW Clinical Social Worker [facility] Desk#: [phone] Cell#: [phone]

## 2023-02-10 ENCOUNTER — Telehealth (HOSPITAL_COMMUNITY): Payer: Self-pay | Admitting: Licensed Clinical Social Worker

## 2023-02-10 NOTE — Telephone Encounter (Signed)
CSW attempted to call pt management company to discuss potential options to assist with rent.  Unable to reach- left VM  [name], LCSW Clinical Social Worker [facility] Desk#: [phone] Cell#: [phone]

## 2023-02-11 ENCOUNTER — Telehealth (HOSPITAL_COMMUNITY): Payer: Self-pay | Admitting: Licensed Clinical Social Worker

## 2023-02-11 ENCOUNTER — Telehealth (HOSPITAL_COMMUNITY): Payer: Self-pay

## 2023-02-11 NOTE — Telephone Encounter (Signed)
Pt reached out cancelling our appointment tomor and asked to resch for next week.    Sydney Shaffer, [place] 02/11/2023

## 2023-02-11 NOTE — Telephone Encounter (Signed)
H&V Care Navigation CSW Progress Note  Clinical Social Worker still unable to get a Psychologist, counselling company.  CSW spoke with pt to inform of this- encouraged her to provide management company my information if they contacted her.  CSW sent pt [employer] eviction mediation flyer so she can reach out regarding her pending eviction for other possible resources.   SDOH Screenings   Food Insecurity: No Food Insecurity (07/02/2022)  Housing: Low Risk  (07/02/2022)  Transportation Needs: No Transportation Needs (07/02/2022)  Depression (PHQ2-9): Medium Risk (10/01/2022)  Financial Resource Strain: High Risk (02/05/2023)  Tobacco Use: Low Risk  (01/07/2023)    [name], [place] [facility] Desk#: [phone] Cell#: [phone]

## 2023-02-19 ENCOUNTER — Other Ambulatory Visit (HOSPITAL_COMMUNITY): Payer: Self-pay

## 2023-02-19 NOTE — Progress Notes (Signed)
Paramedicine Encounter    Patient ID: Sydney Shaffer, female    DOB: [birth year], 57 y.o.   MRN: [id number]   Complaints-none  Edema-none   Compliance with meds-yes  Pill box filled-yes If so, by whom-paramedic  Refills needed-none     Pt reports today has to be short visit b/c she has to leave. She is working with a temp agency to get her a few days a week of work.  She went to leasing office of her property mgmt but nobody was there and she left a note but still unable to reach anyone.  She said her family was able to scratch together some money, she did not disclose the amount of money but she said she sent it in.   She denies any issues or complaints right now.  Meds verified and pill box refilled.    BP 102/74   Pulse (!) 102   Resp 18   Wt 178 lb (80.7 kg)   SpO2 98%   BMI 27.06 kg/m  Weight yesterday-? Last visit weight-179  Patient Care Team: Patient, No Pcp Per as PCP - General (General Practice)  Patient Active Problem List   Diagnosis Date Noted   Dilated cardiomyopathy ([place]) 06/04/2015   Symptomatic anemia 06/04/2015   Chest pain 06/04/2015   Abnormal uterine bleeding (AUB) 06/04/2015   Metrorrhagia 06/04/2015   Abdominal pain    Hypokalemia    Menorrhagia    VT (ventricular tachycardia) ([place]) 07/22/2014   SVT (supraventricular tachycardia) ([place]) 03/30/2014   HTN (hypertension) 03/30/2014   Chest pressure- unspecified 02/03/2014   Atrial tachycardia 10/01/2012   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   Hypertensive heart disease with CHF (congestive heart failure) ([place]) 08/04/2012   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 Needs appt for future refills, Disp: 30 tablet, Rfl: 6   losartan (COZAAR) 25 MG tablet, Take 1/2 tablet (12.5 mg total) by mouth at bedtime., Disp: 15 tablet, Rfl: 3   Multiple Vitamin (MULTIVITAMIN WITH MINERALS) TABS, Take 1 tablet by mouth daily., Disp: , Rfl:    potassium chloride SA (KLOR-CON M) 20 MEQ tablet, Take 3 tablets (60 mEq total) by mouth 2 (two) times daily., Disp: 180 tablet, Rfl: 3   spironolactone (ALDACTONE) 25 MG tablet, Take 1 tablet (25 mg total) by mouth daily. Hold if systolic blood pressure less than 100., Disp: 45 tablet, Rfl: 3   torsemide (DEMADEX) 20 MG tablet, Take 4 tablets (80 mg total) by mouth in the morning AND 2 tablets (40 mg total) every evening., Disp: 545 tablet, Rfl: 2   ferrous sulfate 325 (65 FE) MG tablet, Take 325 mg by mouth daily with breakfast., Disp: , Rfl:  No Known Allergies    Social History   Socioeconomic History   Marital status: Single    Spouse name: Not on file   Number of children: 3   Years of education: Not on file   Highest education level: Not on file  Occupational History   Occupation: receptionist  Employer: [employer]   Occupation: office cleaning  Tobacco Use   Smoking status: Never   Smokeless tobacco: Never  Vaping Use   Vaping Use: Never used  Substance and Sexual Activity   Alcohol use: Yes    Alcohol/week: 1.0 standard drink of alcohol    Types: 1 Glasses of wine per week    Comment: daily   Drug use: No   Sexual activity: Yes    Birth control/protection: Condom  Other Topics Concern   Not on file  Social History Narrative   Not on file   Social Determinants of Health   Financial Resource Strain: High Risk (02/05/2023)   Overall Financial Resource Strain (CARDIA)    Difficulty of Paying Living Expenses: Very hard  Food Insecurity: No  Food Insecurity (07/02/2022)   Hunger Vital Sign    Worried About Running Out of Food in the Last Year: Never true    Ran Out of Food in the Last Year: Never true  Transportation Needs: No Transportation Needs (07/02/2022)   PRAPARE - Hydrologist (Medical): No    Lack of Transportation (Non-Medical): No  Physical Activity: Not on file  Stress: Not on file  Social Connections: Not on file  Intimate Partner Violence: Not on file    Physical Exam      Future Appointments  Date Time Provider [place]  03/02/2023 10:00 AM [facility] ECHO OP 1 [facility]  03/02/2023 11:20 AM Bensimhon, Shaune Pascal, MD MC-[facility]SC None  04/22/2023  4:15 PM [facility] LAB [facility]  05/13/2023  2:00 PM [facility] DEVICE REMOTES [facility]  05/14/2023  3:20 PM Minette Brine, FNP TIMA-TIMA None  08/12/2023  2:00 PM [facility] DEVICE REMOTES [facility]  11/11/2023  2:00 PM [facility] DEVICE REMOTES [facility]       [name], Paramedic [place] Paramedic  02/19/23

## 2023-02-26 ENCOUNTER — Telehealth: Payer: Self-pay | Admitting: *Deleted

## 2023-02-26 ENCOUNTER — Telehealth (HOSPITAL_COMMUNITY): Payer: Self-pay

## 2023-02-26 NOTE — Telephone Encounter (Signed)
Pt reached out to me regarding home visit, she said she will likely be working still and will have to postpone until next week.  She did ask about the latest time for a visit and I told her 4pm-she never responded back about that. If she reaches out later on then if available I will see her but if not will resch for next week.    [name], [place] 02/26/2023

## 2023-02-26 NOTE — Telephone Encounter (Signed)
Order ID: [id number]       Authorized  Approval Valid Through: 02/26/2023 - 03/27/2023

## 2023-03-01 NOTE — Progress Notes (Signed)
Advanced Heart Failure Clinic Note   PCP: Linward Natal, PA-C ([facility]) EP: Dr Caryl Comes Medtronic ICD HF Cardiologist: Dr. Haroldine Laws  HPI: Sydney Shaffer is a 57 y.o.female with chronic systolic HF due to NICM s/p Medtronic ICD, HTN, and SVT s/p 2/15 ablation.  Cath 4/13 normal coronaries.  Lost to follow up until 5/19. Echo 6/19 EF 30-35% mild MR. S/p ICD gen change 12/20.  Reestablished with HF clinic in April 2023. Had been out of some medications.   Echo 4/23: EF 25-30%, LV severely dilated, grade III DD, RV okay, RVSP 49 mmHg, moderate MR, mild to moderate TR   Admitted 7/23 with ADHF after being out of meds. Referred for paramedicine.  Amiodarone increased d/t episodes of VT (monitor only) and runs SVT and AF.   Saw Dr. Caryl Comes in 1/24 concerned about sinus tach. Also had 2 episodes of NSVT. Carvedilol increased. Ivabradine considered.   Today she returns for HF follow up, here with paramedic Katie. Says she is feeling ok but gets easily fatigued. 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 Compliant with all meds.   - Echo today 03/02/23 EF 20-25% RV mild HK Personally reviewed  Cardiac Studies:  - Echo (4/23): EF 25-30% - Echo (10/17): EF 25-30%, Grade 1 DD - Echo (6/16): EF 30-35% mild MR - Echo (2/15): EF 20-25%  - Echo (7/13): EF 20-25%   - R/LHC (10/17): normal cors  RA = 3 RV = 23/4 PA = 22/6 (14) PCW = 8 Ao = 123/69 (91) LV = 106/6 Fick cardiac output/index = 7.4/3.5 PVR = < 1.0 WU SVR = 958  FA sat = 88% PA sat = 63%, 62%   - CPX (5/18)  FVC 1.99 (61%)      FEV1 1.59 (61%)        FEV1/FVC 80 (99%)        MVV 58  (57%)       Resting HR: 81 Peak HR: 121   (72% age predicted max HR) BP rest: 108/68 BP peak: 144/80 Peak VO2: 12.3 (61% predicted peak VO2) VE/VCO2 slope:  25 OUES: 2.13 Peak RER: 0.98 Ventilatory Threshold: 11.2 (55% predicted or measured peak VO2) VE/MVV:  59% PETCO2 at  peak:  40 O2pulse:  10   (91% predicted O2pulse)  - CPX (6/15): FVC 1.69 (51%)  FEV1 1.24 (47%)  FEV1/FVC 74%  Peak VO2: 12.6 (57.8% predicted peak VO2) VE/VCO2 slope: 28.6 OUES: 1.60 Peak RER: 1.01 Peak VO2 adjusted to the patient's ideal body weight of 150 lb (67.8 kg) the peak VO2 is 16.5 ml/kg (ibw)/min (62% of the ibw-adjusted predicted).  SH: She is not a smoker. She does not drink alcohol. Lives alone. She has 3 grown children   FH: Mom breast cancer. Father cancer.   ROS: All systems negative except as listed in HPI, PMH and Problem List.  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    Chronic systolic heart failure (HCC)    Dysrhythmia    Bradycardia   Hypertension    Hypertensive heart disease with CHF (  [place])    ICD (implantable cardiac defibrillator) in place 08/13/2012   Current Outpatient Medications  Medication Sig Dispense Refill   amiodarone (PACERONE) 200 MG tablet Take 1/2 tablet (100 mg total) by mouth daily. 15 tablet 6   carvedilol (COREG) 6.25 MG tablet Take 1 tablet (6.25 mg total) by mouth 2 (two) times daily. 180 tablet 3   dapagliflozin propanediol (FARXIGA) 10 MG TABS tablet Take 1 tablet (10 mg total) by mouth daily. Needs appt for future refills 30 tablet 6   ferrous sulfate 325 (65 FE) MG tablet Take 325 mg by mouth daily with breakfast.     losartan (COZAAR) 25 MG tablet Take 1/2 tablet (12.5 mg total) by mouth at bedtime. 15 tablet 3   Multiple Vitamin (MULTIVITAMIN WITH MINERALS) TABS Take 1 tablet by mouth daily.     potassium chloride SA (KLOR-CON M) 20 MEQ tablet Take 3 tablets (60 mEq total) by mouth 2 (two) times daily. 180 tablet 3   spironolactone  (ALDACTONE) 25 MG tablet Take 1 tablet (25 mg total) by mouth daily. Hold if systolic blood pressure less than 100. 45 tablet 3   torsemide (DEMADEX) 20 MG tablet Take 4 tablets (80 mg total) by mouth in the morning AND 2 tablets (40 mg total) every evening. 545 tablet 2   No current facility-administered medications for this encounter.   BP (!) 90/58   Pulse 81   Wt 81.5 kg (179 lb 9.6 oz)   SpO2 96%   BMI 27.31 kg/m   Wt Readings from Last 3 Encounters:  03/02/23 81.5 kg (179 lb 9.6 oz)  02/19/23 80.7 kg (178 lb)  02/05/23 81.2 kg (179 lb)   Vitals:   03/02/23 1058  BP: (!) 90/58  Pulse: 81  SpO2: 96%  Weight: 81.5 kg (179 lb 9.6 oz)    PHYSICAL EXAM: General:  Well appearing. No resp difficulty HEENT: normal Neck: supple. no JVD. Carotids 2+ bilat; no bruits. No lymphadenopathy or thryomegaly appreciated. Cor: PMI nondisplaced. Regular rate & rhythm. No rubs, gallops or murmurs. Lungs: clear Abdomen: soft, nontender, nondistended. No hepatosplenomegaly. No bruits or masses. Good bowel sounds. Extremities: no cyanosis, clubbing, rash, edema Neuro: alert & orientedx3, cranial nerves grossly intact. moves all 4 extremities w/o difficulty. Affect pleasant  Device interrogation: No VT. Optivol was up slightly. Now ok . Activity level ~1hr/day. Personally reviewed  ASSESSMENT & PLAN: 1. Chronic Systolic Heart Failure:  - Normal cors by cath 2013. Nonischemic cardiomyopathy s/p Medtronic ICD.  - Echo (6/16): EF 30-35%. - Echo (10/17): EF 25-30%.   - Echo (6/19): EF 30-35%. - Echo (4/23): EF 25-30% - Echo today 03/02/23 EF 20-25% RV mild HK Personally reviewed -> echo today concerning for LVNC but unable to get cMRI to confirm with ICD in place. Likely should consider AC - ICD generator change 11/14/19 - Seems worse today NYHA III-IIIB Volume ok - I worry she is nearing the point for advanced therapies. Long talk about possible need for VAD/transplant. Will proceed with CPX  testing. I have asked her to bring her son and daughter to next visit to discuss results.  - Continue losartan 12.5 qhs - Continue spiro 25 mg daily - Continue Coreg 6.25 mg bid. - Continue Farxiga 10 mg daily - Start digoxin 0.125 daily  - Begin w/u for advanced therapies as above                                                                                            -  2. SVT and VT  - s/p atrial tachycardia ablation in 01/2014.   - Device check 7/25: 6 monitored VT, runs SVT.  - Continue amio 100 daily - She follows with Dr. Caryl Comes.   3. SDOH - Compliance has improved greatly  with Paramedicine program. Appreciate assistance.  Total time spent 45 minutes. Over half that time spent discussing above.    Glori Bickers, MD 03/02/2023

## 2023-03-02 ENCOUNTER — Encounter (HOSPITAL_COMMUNITY): Payer: Self-pay | Admitting: Internal Medicine

## 2023-03-02 ENCOUNTER — Ambulatory Visit (INDEPENDENT_AMBULATORY_CARE_PROVIDER_SITE_OTHER): Payer: BC Managed Care – PPO

## 2023-03-02 ENCOUNTER — Other Ambulatory Visit (HOSPITAL_COMMUNITY): Payer: Self-pay

## 2023-03-02 ENCOUNTER — Ambulatory Visit (HOSPITAL_COMMUNITY)
Admission: RE | Admit: 2023-03-02 | Discharge: 2023-03-02 | Disposition: A | Discharge status: Home / Self Care | Payer: BC Managed Care – PPO | Source: Ambulatory Visit | Attending: Internal Medicine | Admitting: Internal Medicine

## 2023-03-02 ENCOUNTER — Ambulatory Visit (HOSPITAL_BASED_OUTPATIENT_CLINIC_OR_DEPARTMENT_OTHER)
Admission: RE | Admit: 2023-03-02 | Discharge: 2023-03-02 | Disposition: A | Discharge status: Home / Self Care | Payer: BC Managed Care – PPO | Source: Ambulatory Visit | Attending: Internal Medicine | Admitting: Internal Medicine

## 2023-03-02 VITALS — BP 90/58 | HR 81 | Wt 179.6 lb

## 2023-03-02 DIAGNOSIS — I5022 Chronic systolic (congestive) heart failure: Secondary | ICD-10-CM

## 2023-03-02 DIAGNOSIS — I110 Hypertensive heart disease with heart failure: Secondary | ICD-10-CM | POA: Insufficient documentation

## 2023-03-02 DIAGNOSIS — I471 Supraventricular tachycardia, unspecified: Secondary | ICD-10-CM | POA: Diagnosis not present

## 2023-03-02 DIAGNOSIS — I3139 Other pericardial effusion (noninflammatory): Secondary | ICD-10-CM | POA: Insufficient documentation

## 2023-03-02 DIAGNOSIS — I420 Dilated cardiomyopathy: Secondary | ICD-10-CM

## 2023-03-02 DIAGNOSIS — I340 Nonrheumatic mitral (valve) insufficiency: Secondary | ICD-10-CM | POA: Insufficient documentation

## 2023-03-02 DIAGNOSIS — I472 Ventricular tachycardia, unspecified: Secondary | ICD-10-CM | POA: Diagnosis not present

## 2023-03-02 LAB — CUP PACEART REMOTE DEVICE CHECK
Battery Remaining Longevity: 98 mo
Battery Voltage: 3 V
Brady Statistic AP VP Percent: 0 %
Brady Statistic AP VS Percent: 0.01 %
Brady Statistic AS VP Percent: 0 %
Brady Statistic AS VS Percent: 99.99 %
Brady Statistic RA Percent Paced: 0.01 %
Brady Statistic RV Percent Paced: 0 %
Date Time Interrogation Session: 20240325105909
HighPow Impedance: 69 Ohm
Implantable Lead Connection Status: 753985
Implantable Lead Connection Status: 753985
Implantable Lead Implant Date: 20130906
Implantable Lead Implant Date: 20130906
Implantable Lead Location: 753859
Implantable Lead Location: 753860
Implantable Lead Model: 181
Implantable Lead Model: 5076
Implantable Lead Serial Number: 322962
Implantable Pulse Generator Implant Date: 20201207
Lead Channel Impedance Value: 361 Ohm
Lead Channel Impedance Value: 361 Ohm
Lead Channel Impedance Value: 399 Ohm
Lead Channel Pacing Threshold Amplitude: 0.5 V
Lead Channel Pacing Threshold Amplitude: 1.625 V
Lead Channel Pacing Threshold Pulse Width: 0.4 ms
Lead Channel Pacing Threshold Pulse Width: 0.4 ms
Lead Channel Sensing Intrinsic Amplitude: 1.75 mV
Lead Channel Sensing Intrinsic Amplitude: 1.75 mV
Lead Channel Sensing Intrinsic Amplitude: 10.25 mV
Lead Channel Sensing Intrinsic Amplitude: 10.25 mV
Lead Channel Setting Pacing Amplitude: 1.5 V
Lead Channel Setting Pacing Amplitude: 2.5 V
Lead Channel Setting Pacing Pulse Width: 1 ms
Lead Channel Setting Sensing Sensitivity: 0.3 mV
Zone Setting Status: 755011

## 2023-03-02 LAB — ECHOCARDIOGRAM COMPLETE
Area-P 1/2: 5.06 cm2
Calc EF: 16.7 %
Est EF: <20
MV M vel: 4.18 m/s
MV Peak grad: 69.9 mmHg
Radius: 0.5 cm
S' Lateral: 5.5 cm
Single Plane A2C EF: 17.9 %
Single Plane A4C EF: 15 %

## 2023-03-02 LAB — BASIC METABOLIC PANEL
Anion gap: 11 (ref 5–15)
BUN: 14 mg/dL (ref 6–20)
CO2: 32 mmol/L (ref 22–32)
Calcium: 10 mg/dL (ref 8.9–10.3)
Chloride: 97 mmol/L — ABNORMAL LOW (ref 98–111)
Creatinine, Ser: 1.05 mg/dL — ABNORMAL HIGH (ref 0.44–1.00)
GFR, Estimated: >60 mL/min (ref >60)
Glucose, Bld: 80 mg/dL (ref 70–99)
Potassium: 3.2 mmol/L — ABNORMAL LOW (ref 3.5–5.1)
Sodium: 140 mmol/L (ref 135–145)

## 2023-03-02 LAB — BRAIN NATRIURETIC PEPTIDE: B Natriuretic Peptide: 1067.8 pg/mL — ABNORMAL HIGH (ref 0.0–100.0)

## 2023-03-02 MED ORDER — DIGOXIN 125 MCG PO TABS
0.1250 mg | ORAL_TABLET | Freq: Every day | ORAL | 3 refills | Status: DC
  Filled 2023-03-02: qty 90, 90d supply, fill #0

## 2023-03-02 NOTE — Patient Instructions (Signed)
START Digoxin .125mg  daily.  Labs done today, your results will be available in MyChart, we will contact you for abnormal readings.  Your physician has recommended that you have a cardiopulmonary stress test (CPX). CPX testing is a non-invasive measurement of heart and lung function. It replaces a traditional treadmill stress test. This type of test provides a tremendous amount of information that relates not only to your present condition but also for future outcomes. This test combines measurements of you ventilation, respiratory gas exchange in the lungs, electrocardiogram (EKG), blood pressure and physical response before, during, and following an exercise protocol.  Your physician recommends that you schedule a follow-up appointment in: 2 months  If you have any questions or concerns before your next appointment please send [place] a message through [place] or call our office at [phone].    TO LEAVE A MESSAGE FOR THE NURSE SELECT OPTION 2, PLEASE LEAVE A MESSAGE INCLUDING: YOUR NAME DATE OF BIRTH CALL BACK NUMBER REASON FOR CALL**this is important as we prioritize the call backs  YOU WILL RECEIVE A CALL BACK THE SAME DAY AS LONG AS YOU CALL BEFORE 4:00 PM  At the [facility], you and your health needs are our priority. As part of our continuing mission to provide you with exceptional heart care, we have created designated Provider Care Teams. These Care Teams include your primary Cardiologist (physician) and Advanced Practice Providers (APPs- Physician Assistants and Nurse Practitioners) who all work together to provide you with the care you need, when you need it.   You may see any of the following providers on your designated Care Team at your next follow up: Dr Glori Bickers Dr [name] Dr. Roxana Hires, NP Lyda Jester, [place] [facility] [place], [place] Forestine Na, NP Audry Riles, PharmD   Please be sure to bring in all your  medications bottles to every appointment.    Thank you for choosing [facility]

## 2023-03-02 NOTE — Progress Notes (Signed)
Echocardiogram 2D Echocardiogram has been performed.  Sydney Shaffer 03/02/2023, 10:58 AM

## 2023-03-02 NOTE — Progress Notes (Signed)
Paramedicine Encounter   Patient ID: Sydney Shaffer , female,   DOB: [birth year],56 y.o.,  MRN: [id number]   Met patient in clinic today with provider.  Weight @ clinic-179 B/P-90/58 P-81 SP02-96  ECHO-20-25%  Needs CPX test repeated.   Med changes- Digoxin being added   Will get jenna to send in referral to servant center for disability.   She is c/o back pain this morning. She has been working some here and there and she was standing directly on concrete. So she thinks this may be contributing to the pain.  She got up this morning feeling dizzy.   She recently has been checking her b/p at home now. For a long time she said she needed batteries for it.  She reports this morning at home her b/p was fine and then at the ECHO it was fine then over here in clinic it had dropped.   Pt reports she got the 2nd notice of eviction last week.  But she has been paying them some of what she can and they have been accepting it.  Her resources that were given to her she did try calling but their funds are depleted.  She said social services were out of funds, she couldn't get anyone on the phone for [employer] so she will try again there.  Gave her some info on a couple churches per online that could help with rent. She will reach out to them as well.   Refills-  Farxiga Amio Potassium  Losartan Torsemide  Meds verified and pill box refilled.  I advised her once she gets the digoxin then to place one tablet daily in pill box.   She asked how much her meds would be-but the refills were done over the auto service so unsure---I asked her to let me know if she needs $$ and I can see if there are gift cards avail for that.  She has [employer]) but her co-pays are still costly for her and more so now since she has very limited income.   [name], [name] 03/02/2023

## 2023-03-03 ENCOUNTER — Other Ambulatory Visit (HOSPITAL_COMMUNITY): Payer: Self-pay

## 2023-03-03 ENCOUNTER — Telehealth (HOSPITAL_COMMUNITY): Payer: Self-pay | Admitting: Licensed Clinical Social Worker

## 2023-03-03 ENCOUNTER — Telehealth (HOSPITAL_COMMUNITY): Payer: Self-pay | Admitting: Pharmacy Technician

## 2023-03-03 NOTE — Telephone Encounter (Signed)
Advanced Heart Failure Patient Advocate Encounter  Patient has high deductible plan and co-pay leftover for [name] is unaffordable. Started J[employer]iance assistance application and sent via docusign for patient signature.  If approved, will update med list.

## 2023-03-03 NOTE — Progress Notes (Addendum)
Heart and Vascular Care Navigation  03/03/2023  Connar Rothenberger 07-Jul-1966 [id number]  Reason for Referral: financial strain, housing concerns.   Engaged with patient by telephone for follow up visit for Heart and Vascular Care Coordination.                                                                                                   Assessment:      CSW spoke with pt regarding above concerns.    Paramedic had reached out about inability to pay for around $120 in medication copays- CSW able to assist with part of this amount and had [facility] charge the rest to her account- she was informed of this and will pick up medications tomorrow.  Pt also concerned with [name] which is $46 despite copay card- discussed with patient advocate who will send pt [employer] app to see if she will qualify.  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  Is about $2000 behind in rent and received another notice of pending eviction.  Has not been able to talk to land lord yet CSW encouraged her to continue to try as we might be able to assist with some past due to prevent eviction.  Pt will plan to provide to [place] if she locates working number- can't move forward with any patient assistance without this.  EF now 25% and is considering applying for disability.  CSW discussed ways she can apply and she will plan to go to [facility] office in person to do so.  CSW explained that disability takes at least 6-40months so she should be thinking about alternative ways to live if she cannot continue to work.  Pt doesn't think she has any alternative but understands what CSW is saying.                               HRT/VAS Care Coordination     Outpatient Care Team Community Paramedicine; Social Worker   Community Paramedic Name: Renee Ramus [phone]   Social Worker Name: Tammy Sours, Advanced HF Clinic, [phone]   Living  arrangements for the past 2 months Apartment   Lives with: Self   Patient Current Insurance Coverage Self-Pay   Patient Has Concern With Paying Medical Bills No   Does Patient Have Prescription Coverage? No   Patient Prescription Assistance Programs Heart Failure [facility] Assistive Devices/Equipment --  has heart monitor       Social History:                                                                             SDOH Screenings   Food Insecurity: No  Food Insecurity (07/02/2022)  Housing: Medium Risk (03/03/2023)  Transportation Needs: No Transportation Needs (07/02/2022)  Depression (PHQ2-9): Medium Risk (10/01/2022)  Financial Resource Strain: High Risk (03/03/2023)  Tobacco Use: Low Risk  (03/02/2023)    SDOH Interventions: Financial Resources:  Financial Strain Interventions: Other (Comment) (assisted with medication costs) Working part time making less than $200/week  Food Insecurity:   None reported  Housing Insecurity:  Housing Interventions: Other (Comment) (discussed patient care fund)  Transportation:    No concerns reported     Follow-up plan:    CSW will continue to follow and assist as able.  Pt to locate someone with her management company for [place] to talk to about assistance.  [name], LCSW Clinical Social Worker Advanced Heart Failure Clinic Desk#: [phone] Cell#: [phone]

## 2023-03-04 ENCOUNTER — Telehealth (HOSPITAL_COMMUNITY): Payer: Self-pay

## 2023-03-04 DIAGNOSIS — I5022 Chronic systolic (congestive) heart failure: Secondary | ICD-10-CM

## 2023-03-04 NOTE — Telephone Encounter (Signed)
-----   Message from Jolaine Artist, MD sent at 03/04/2023  4:08 PM EDT ----- Potassium low. Is patient currently taking potassium 60 bid? If so, increase to 80 bid.   Repeat BMET 2 weeks.  Thanks

## 2023-03-04 NOTE — Telephone Encounter (Signed)
Patient advised and verbalized understanding, she states she has not been taking 60mg  bid,lab appointment scheduled,lab orders entered  Orders Placed This Encounter  Procedures   Basic metabolic panel    Standing Status:   Future    Standing Expiration Date:   03/03/2024    Order Specific Question:   Release to patient    Answer:   Immediate    Order Specific Question:   Release to patient    Answer:   Immediate [1]

## 2023-03-09 ENCOUNTER — Telehealth (HOSPITAL_COMMUNITY): Payer: Self-pay

## 2023-03-11 NOTE — Telephone Encounter (Signed)
Reached out to pt about home visit this week.  She is working several days this week and unsure when she will be able to meet but will keep me updated.   Sydney Shaffer, [place] 03/09/2023

## 2023-03-16 ENCOUNTER — Other Ambulatory Visit (HOSPITAL_COMMUNITY): Payer: BC Managed Care – PPO

## 2023-03-16 ENCOUNTER — Telehealth (HOSPITAL_COMMUNITY): Payer: Self-pay

## 2023-03-16 NOTE — Telephone Encounter (Signed)
Reached out to pt to sch home visit this week by text.   No response. Will try again this week.   Kerry Hough, EMT-Paramedic  [phone] 03/16/2023

## 2023-03-17 ENCOUNTER — Telehealth (HOSPITAL_COMMUNITY): Payer: Self-pay

## 2023-03-17 NOTE — Telephone Encounter (Signed)
Pt reached out to me asking if I got a previous message about me coming by today- I did not get any message but by this time she is on the way to work.  She said she has clinic appoint next week and I can meet her there.  Also reminded her about the lab appoint this Friday.    Kerry Hough, EMT-Paramedic  [phone] 03/17/2023

## 2023-03-20 ENCOUNTER — Ambulatory Visit (HOSPITAL_COMMUNITY)
Admission: RE | Admit: 2023-03-20 | Discharge: 2023-03-20 | Disposition: A | Discharge status: Home / Self Care | Payer: BC Managed Care – PPO | Source: Ambulatory Visit | Attending: Cardiology | Admitting: Cardiology

## 2023-03-20 DIAGNOSIS — I5022 Chronic systolic (congestive) heart failure: Secondary | ICD-10-CM | POA: Diagnosis not present

## 2023-03-20 LAB — BASIC METABOLIC PANEL
Anion gap: 6 (ref 5–15)
BUN: 19 mg/dL (ref 6–20)
CO2: 26 mmol/L (ref 22–32)
Calcium: 9.5 mg/dL (ref 8.9–10.3)
Chloride: 104 mmol/L (ref 98–111)
Creatinine, Ser: 1.14 mg/dL — ABNORMAL HIGH (ref 0.44–1.00)
GFR, Estimated: 56 mL/min — ABNORMAL LOW (ref >60)
Glucose, Bld: 135 mg/dL — ABNORMAL HIGH (ref 70–99)
Potassium: 4 mmol/L (ref 3.5–5.1)
Sodium: 136 mmol/L (ref 135–145)

## 2023-03-26 ENCOUNTER — Ambulatory Visit (HOSPITAL_COMMUNITY): Payer: BC Managed Care – PPO | Attending: Internal Medicine

## 2023-03-26 ENCOUNTER — Other Ambulatory Visit (HOSPITAL_COMMUNITY): Payer: Self-pay

## 2023-03-26 ENCOUNTER — Other Ambulatory Visit (HOSPITAL_COMMUNITY): Payer: Self-pay | Admitting: *Deleted

## 2023-03-26 DIAGNOSIS — I110 Hypertensive heart disease with heart failure: Secondary | ICD-10-CM | POA: Diagnosis not present

## 2023-03-26 DIAGNOSIS — I5022 Chronic systolic (congestive) heart failure: Secondary | ICD-10-CM | POA: Insufficient documentation

## 2023-03-26 DIAGNOSIS — I4891 Unspecified atrial fibrillation: Secondary | ICD-10-CM | POA: Insufficient documentation

## 2023-03-26 DIAGNOSIS — Z95810 Presence of automatic (implantable) cardiac defibrillator: Secondary | ICD-10-CM | POA: Insufficient documentation

## 2023-03-26 DIAGNOSIS — I428 Other cardiomyopathies: Secondary | ICD-10-CM | POA: Diagnosis not present

## 2023-03-26 DIAGNOSIS — R0600 Dyspnea, unspecified: Secondary | ICD-10-CM | POA: Diagnosis not present

## 2023-03-26 DIAGNOSIS — R059 Cough, unspecified: Secondary | ICD-10-CM | POA: Diagnosis not present

## 2023-03-26 NOTE — Progress Notes (Signed)
Paramedicine Encounter    Patient ID: Sydney Shaffer, female    DOB: 1966/10/16, 57 y.o.   MRN: [id number]   Complaints-overwhelmed with housing/tired  Edema-ankles -was in abd too but feels better today  Compliance with meds-??--I dont see weekly to confirm   Pill box filled-yes If so, by whom-paramedic  Refills needed- Carvedilol--she does not have the bottle  Iron  Mulit-vitamin    She has been sending in small payments for rent. She hasn't heard back anything yet.   She reports she just feels horrible--she reports easy gag reflex with smells and she gets very nauseated quickly.  She has CPX today.  She got confused on when to bring her family in for the meeting but confirmed with kristen that its at the next provider meeting, not today but still to have someone with her in case they need to drive her home.   She reports tiredness.  Meds rx fill dates dont match pill count for torsemide and potassium but she said she had some leftover from last time ??? At next visit she will be discussing some major options for her HF with family and provider.   Pt states her breathing is doing ok.  Some dizziness.  B/p low--she has been checking at home and been getting readings around 100. Reminded her again not to take spiro, losartan or carvedilol if her b/p is less than 100. She said she has been doing that.  Meds verified and pill box refilled.  She is going to call to refill carvedilol but I thought she had plenty last time I seen her--she said it may be around house somewhere.   BP 90/68   Pulse 78   Resp 16   Wt 170 lb (77.1 kg)   SpO2 95%   BMI 25.85 kg/m  Weight yesterday-170?? Low batteries Last visit weight-179   Patient Care Team: Patient, No Pcp Per as PCP - General (General Practice)  Patient Active Problem List   Diagnosis Date Noted   Dilated cardiomyopathy 06/04/2015   Symptomatic anemia 06/04/2015   Chest pain 06/04/2015   Abnormal uterine bleeding (AUB)  06/04/2015   Metrorrhagia 06/04/2015   Abdominal pain    Hypokalemia    Menorrhagia    VT (ventricular tachycardia) 07/22/2014   SVT (supraventricular tachycardia) (HCC) 03/30/2014   HTN (hypertension) 03/30/2014   Chest pressure- unspecified 02/03/2014   Atrial tachycardia 10/01/2012   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   Hypertensive heart disease with CHF (congestive heart failure) 08/04/2012   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 Needs appt for future refills, Disp: 30 tablet, Rfl: 6   digoxin (LANOXIN) 0.125 MG tablet, Take 1 tablet (0.125 mg total) by mouth daily., Disp: 90 tablet, Rfl: 3   ferrous sulfate 325 (65 FE) MG tablet, Take 325 mg by mouth daily with breakfast., Disp: , Rfl:    losartan (COZAAR) 25 MG tablet, Take 1/2 tablet (12.5 mg total) by mouth at bedtime., Disp: 15 tablet, Rfl: 3   Multiple Vitamin (MULTIVITAMIN WITH MINERALS) TABS, Take 1 tablet by mouth daily., Disp: , Rfl:    potassium chloride SA (KLOR-CON M) 20 MEQ tablet, Take  3 tablets (60 mEq total) by mouth 2 (two) times daily., Disp: 180 tablet, Rfl: 3   spironolactone (ALDACTONE) 25 MG tablet, Take 1 tablet (25 mg total) by mouth daily. Hold if systolic blood pressure less than 100., Disp: 45 tablet, Rfl: 3   torsemide (DEMADEX) 20 MG tablet, Take 4 tablets (80 mg total) by mouth in the morning AND 2 tablets (40 mg total) every evening., Disp: 545 tablet, Rfl: 2   carvedilol (COREG) 6.25 MG tablet, Take 1 tablet (6.25 mg total) by mouth 2 (two) times daily., Disp: 180 tablet, Rfl: 3 No Known Allergies    Social History   Socioeconomic History    Marital status: Single    Spouse name: Not on file   Number of children: 3   Years of education: Not on file   Highest education level: Not on file  Occupational History   Occupation: receptionist    Employer: [employer]   Occupation: office cleaning  Tobacco Use   Smoking status: Never   Smokeless tobacco: Never  Vaping Use   Vaping Use: Never used  Substance and Sexual Activity   Alcohol use: Yes    Alcohol/week: 1.0 standard drink of alcohol    Types: 1 Glasses of wine per week    Comment: daily   Drug use: No   Sexual activity: Yes    Birth control/protection: Condom  Other Topics Concern   Not on file  Social History Narrative   Not on file   Social Determinants of Health   Financial Resource Strain: High Risk (03/03/2023)   Overall Financial Resource Strain (CARDIA)    Difficulty of Paying Living Expenses: Very hard  Food Insecurity: No Food Insecurity (07/02/2022)   Hunger Vital Sign    Worried About Running Out of Food in the Last Year: Never true    Ran Out of Food in the Last Year: Never true  Transportation Needs: No Transportation Needs (07/02/2022)   PRAPARE - Administrator, Civil Service (Medical): No    Lack of Transportation (Non-Medical): No  Physical Activity: Not on file  Stress: Not on file  Social Connections: Not on file  Intimate Partner Violence: Not on file    Physical Exam      Future Appointments  Date Time Provider Department Center  04/22/2023  4:15 PM [facility]  05/07/2023 10:20 AM Bensimhon, Bevelyn Buckles, MD [facility] None  05/14/2023  3:20 PM Arnette Felts, FNP [facility] None  06/01/2023  1:05 PM [facility] DEVICE REMOTES [facility]  08/31/2023  1:05 PM [facility] DEVICE REMOTES [facility]  11/30/2023  1:05 PM [facility] DEVICE REMOTES [facility]  02/29/2024  1:05 PM [facility] DEVICE REMOTES [facility]  05/30/2024  1:05 PM [facility]  DEVICE REMOTES [facility]       Kerry Hough, Paramedic [phone] [facility] Paramedic  03/26/23

## 2023-04-01 ENCOUNTER — Telehealth (HOSPITAL_COMMUNITY): Payer: Self-pay

## 2023-04-01 NOTE — Telephone Encounter (Signed)
Reached out to pt about home visit this week, due to her work schedule this week she is not able to meet.  No issues or concerns with refills/meds at this time.  Will f/u next week   Kerry Hough, EMT-Paramedic  [phone] 04/01/2023

## 2023-04-01 NOTE — Telephone Encounter (Signed)
Advanced Heart Failure Patient Advocate Encounter  Called and left the patient a message, application has not been signed. Advised patient to call back if she is having issues accessing link or decided to forego applying for assistance.  Archer Asa, CPhT

## 2023-04-06 ENCOUNTER — Telehealth (HOSPITAL_COMMUNITY): Payer: Self-pay

## 2023-04-08 NOTE — Telephone Encounter (Signed)
Reached out to pt on Monday regarding home visit this week.  She has not responded as of today.  Will try again next week.   Kerry Hough, EMT-Paramedic  [phone] 04/08/2023

## 2023-04-10 NOTE — Progress Notes (Signed)
Remote ICD transmission.   

## 2023-04-13 ENCOUNTER — Other Ambulatory Visit (HOSPITAL_COMMUNITY): Payer: Self-pay

## 2023-04-13 NOTE — Progress Notes (Signed)
Paramedicine Encounter    Patient ID: Sydney Shaffer, female    DOB: [birth year], 57 y.o.   MRN: [id number]   Complaints-no new complaints  Edema-slight to legs  Compliance with meds-somewhat -can't confirm exactly how much due to her being so inconsistent with home visits -still does not have her carvedilol-that has been several wks now Pill box filled-yes If so, by whom-paramedic   Refills needed-carvedilol , iron and multi-vit   Pt reports that she is doing ok. Its been a few wks since I have been able to meet her due to her work schedule.  Sh reports she is still making small payments to rent and have not heard anything back from company and has not seen anything in mail either.  She did apply for medicaid and have not heard back from that either.   She got message from Sydney Shaffer regarding med assistance to see if she still needs it.  It was sent to pts email but pt reports she didn't see it-I advised her to check her spam folder and see if its there-if not then she will call Sydney Shaffer back or myself to work around that.  It was for jardiance application.  She reports her breathing is doing ok.  She does get dizzy when getting out of bed and standing up.  She reports feeling bloated at times.  She does report appetite is better, her nausea rarely comes on now.  She does have some swelling to her lowe legs.   Meds veriifed and pill box refilled.   BP 118/88   Pulse 78   Resp 16   Wt 170 lb (77.1 kg)   SpO2 96%   BMI 25.85 kg/m  Weight yesterday-170  Last visit weight-170  Patient Care Team: Patient, No Pcp Per as PCP - General (General Practice)  Patient Active Problem List   Diagnosis Date Noted   Dilated cardiomyopathy (HCC) 06/04/2015   Symptomatic anemia 06/04/2015   Chest pain 06/04/2015   Abnormal uterine bleeding (AUB) 06/04/2015   Metrorrhagia 06/04/2015   Abdominal pain    Hypokalemia    Menorrhagia    VT (ventricular tachycardia) (HCC) 07/22/2014   SVT  (supraventricular tachycardia) (HCC) 03/30/2014   HTN (hypertension) 03/30/2014   Chest pressure- unspecified 02/03/2014   Atrial tachycardia 10/01/2012   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   Hypertensive heart disease with CHF (congestive heart failure) (HCC) 08/04/2012   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   Acute on chronic systolic heart failure (HCC) 05/05/2012   Dyspnea 03/29/2012   Anemia 03/29/2012    Current Outpatient Medications:    amiodarone (PACERONE) 200 MG tablet, Take 1/2 tablet (100 mg total) by mouth daily., Disp: 15 tablet, Rfl: 6   dapagliflozin propanediol (FARXIGA) 10 MG TABS tablet, Take 1 tablet (10 mg total) by mouth daily. Needs appt for future refills, Disp: 30 tablet, Rfl: 6   digoxin (LANOXIN) 0.125 MG tablet, Take 1 tablet (0.125 mg total) by mouth daily., Disp: 90 tablet, Rfl: 3   losartan (COZAAR) 25 MG tablet, Take 1/2 tablet (12.5 mg total) by mouth at bedtime., Disp: 15 tablet, Rfl: 3   Multiple Vitamin (MULTIVITAMIN WITH MINERALS) TABS, Take 1 tablet by mouth daily., Disp: , Rfl:    potassium chloride SA (KLOR-CON M) 20 MEQ tablet, Take 3 tablets (60 mEq total) by mouth 2 (two) times daily., Disp: 180 tablet, Rfl: 3   spironolactone (ALDACTONE) 25 MG tablet, Take 1 tablet (25 mg total) by mouth daily. Hold if systolic blood pressure  less than 100., Disp: 45 tablet, Rfl: 3   torsemide (DEMADEX) 20 MG tablet, Take 4 tablets (80 mg total) by mouth in the morning AND 2 tablets (40 mg total) every evening., Disp: 545 tablet, Rfl: 2   carvedilol (COREG) 6.25 MG tablet, Take 1 tablet (6.25 mg total) by mouth 2 (two) times daily., Disp: 180 tablet, Rfl: 3   ferrous sulfate 325 (65 FE) MG tablet, Take 325 mg by mouth daily with breakfast., Disp: , Rfl:  No Known Allergies    Social History   Socioeconomic History   Marital status: Single    Spouse name: Not on file   Number of children: 3   Years of education: Not on file    Highest education level: Not on file  Occupational History   Occupation: receptionist    Employer: [employer]   Occupation: office cleaning  Tobacco Use   Smoking status: Never   Smokeless tobacco: Never  Vaping Use   Vaping Use: Never used  Substance and Sexual Activity   Alcohol use: Yes    Alcohol/week: 1.0 standard drink of alcohol    Types: 1 Glasses of wine per week    Comment: daily   Drug use: No   Sexual activity: Yes    Birth control/protection: Condom  Other Topics Concern   Not on file  Social History Narrative   Not on file   Social Determinants of Health   Financial Resource Strain: High Risk (03/03/2023)   Overall Financial Resource Strain (CARDIA)    Difficulty of Paying Living Expenses: Very hard  Food Insecurity: No Food Insecurity (07/02/2022)   Hunger Vital Sign    Worried About Running Out of Food in the Last Year: Never true    Ran Out of Food in the Last Year: Never true  Transportation Needs: No Transportation Needs (07/02/2022)   PRAPARE - Administrator, Civil Service (Medical): No    Lack of Transportation (Non-Medical): No  Physical Activity: Not on file  Stress: Not on file  Social Connections: Not on file  Intimate Partner Violence: Not on file    Physical Exam      Future Appointments  Date Time Provider Department Center  04/22/2023  4:15 PM [facility]  05/07/2023 10:20 AM Bensimhon, Bevelyn Buckles, MD [facility] None  05/14/2023  3:20 PM Arnette Felts, FNP [facility] None  06/01/2023  1:05 PM [facility] DEVICE REMOTES [facility]  08/31/2023  1:05 PM [facility]ICE REMOTES [facility]  11/30/2023  1:05 PM [facility]ICE REMOTES [facility]  02/29/2024  1:05 PM [facility] DEVICE REMOTES [facility]  05/30/2024  1:05 PM [facility]ICE REMOTES [facility]       Kerry Hough, Paramedic [phone] [facility] Paramedic   04/13/23

## 2023-04-15 ENCOUNTER — Telehealth: Payer: Self-pay

## 2023-04-15 NOTE — Telephone Encounter (Signed)
Message received from  Kerry Hough, EMT asking if I could have the DSS Medicaid Eligibility Caseworkers check on the status of patient's Medicaid application.   I called patient in attempt to obtain her approval for me  to contact the DSS caseworkers.  Message left with call back requested.

## 2023-04-22 ENCOUNTER — Other Ambulatory Visit (HOSPITAL_COMMUNITY): Payer: Self-pay

## 2023-04-22 ENCOUNTER — Ambulatory Visit: Payer: BC Managed Care – PPO | Attending: Internal Medicine

## 2023-04-22 DIAGNOSIS — R748 Abnormal levels of other serum enzymes: Secondary | ICD-10-CM

## 2023-04-22 DIAGNOSIS — Z79899 Other long term (current) drug therapy: Secondary | ICD-10-CM

## 2023-04-23 ENCOUNTER — Other Ambulatory Visit (HOSPITAL_COMMUNITY): Payer: Self-pay

## 2023-04-23 ENCOUNTER — Other Ambulatory Visit: Payer: Self-pay

## 2023-04-23 LAB — HEPATIC FUNCTION PANEL
ALT: 41 IU/L — ABNORMAL HIGH (ref 0–32)
AST: 46 IU/L — ABNORMAL HIGH (ref 0–40)
Albumin: 4.2 g/dL (ref 3.8–4.9)
Alkaline Phosphatase: 178 IU/L — ABNORMAL HIGH (ref 44–121)
Bilirubin Total: 0.8 mg/dL (ref 0.0–1.2)
Bilirubin, Direct: 0.26 mg/dL (ref 0.00–0.40)
Total Protein: 7.3 g/dL (ref 6.0–8.5)

## 2023-04-23 NOTE — Telephone Encounter (Signed)
I received a message that the patient had returned my call.  I called her again but had to leave a message requesting a call back

## 2023-04-23 NOTE — Progress Notes (Signed)
Paramedicine Encounter    Patient ID: Sydney Shaffer, female    DOB: 09-17-1966, 57 y.o.   MRN: [id number]   Complaints-emotionally down-nervous about upcoming clinic appoint-big discussion   Edema-slight  Compliance with meds-?   Pill box filled-yes If so, by whom-paramedic   Refills needed-potassium -farxiga samples needed   Pt reports she is doing ok.  Same as usual.  Trying to apply her for jardiance assist but there is issue with her insurance-when stephanie tried to run thru to see what her copay is to see if she is eligible for the assistance  but it was coming back like insurance is in limbo due to non-paid premiums-   She is going to call insurance to see what is going on with premiums-  She has the same farxiga bottle from a couple mths ago and I know she hasn't been able to get it refilled-so she has not been taking it accurately.  Will f/u next week.   BP 102/80   Pulse 80   Resp 16   Wt 177 lb (80.3 kg)   SpO2 98%   BMI 26.91 kg/m  Weight yesterday-177 Last visit weight-170  Patient Care Team: Patient, No Pcp Per as PCP - General (General Practice)  Patient Active Problem List   Diagnosis Date Noted   Dilated cardiomyopathy (HCC) 06/04/2015   Symptomatic anemia 06/04/2015   Chest pain 06/04/2015   Abnormal uterine bleeding (AUB) 06/04/2015   Metrorrhagia 06/04/2015   Abdominal pain    Hypokalemia    Menorrhagia    VT (ventricular tachycardia) (HCC) 07/22/2014   SVT (supraventricular tachycardia) (HCC) 03/30/2014   HTN (hypertension) 03/30/2014   Chest pressure- unspecified 02/03/2014   Atrial tachycardia 10/01/2012   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   Hypertensive heart disease with CHF (congestive heart failure) (HCC) 08/04/2012   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   Acute on chronic systolic heart failure (HCC) 05/05/2012   Dyspnea 03/29/2012   Anemia 03/29/2012    Current Outpatient Medications:    carvedilol  (COREG) 6.25 MG tablet, Take 1 tablet (6.25 mg total) by mouth 2 (two) times daily., Disp: 180 tablet, Rfl: 3   dapagliflozin propanediol (FARXIGA) 10 MG TABS tablet, Take 1 tablet (10 mg total) by mouth daily. Needs appt for future refills, Disp: 30 tablet, Rfl: 6   digoxin (LANOXIN) 0.125 MG tablet, Take 1 tablet (0.125 mg total) by mouth daily., Disp: 90 tablet, Rfl: 3   ferrous sulfate 325 (65 FE) MG tablet, Take 325 mg by mouth daily with breakfast., Disp: , Rfl:    losartan (COZAAR) 25 MG tablet, Take 1/2 tablet (12.5 mg total) by mouth at bedtime., Disp: 15 tablet, Rfl: 3   Multiple Vitamin (MULTIVITAMIN WITH MINERALS) TABS, Take 1 tablet by mouth daily., Disp: , Rfl:    potassium chloride SA (KLOR-CON M) 20 MEQ tablet, Take 3 tablets (60 mEq total) by mouth 2 (two) times daily., Disp: 180 tablet, Rfl: 3   spironolactone (ALDACTONE) 25 MG tablet, Take 1 tablet (25 mg total) by mouth daily. Hold if systolic blood pressure less than 100., Disp: 45 tablet, Rfl: 3   torsemide (DEMADEX) 20 MG tablet, Take 4 tablets (80 mg total) by mouth in the morning AND 2 tablets (40 mg total) every evening., Disp: 545 tablet, Rfl: 2   amiodarone (PACERONE) 200 MG tablet, Take 1/2 tablet (100 mg total) by mouth daily., Disp: 15 tablet, Rfl: 6 No Known Allergies    Social History   Socioeconomic History  Marital status: Single    Spouse name: Not on file   Number of children: 3   Years of education: Not on file   Highest education level: Not on file  Occupational History   Occupation: receptionist    Employer: [employer]   Occupation: office cleaning  Tobacco Use   Smoking status: Never   Smokeless tobacco: Never  Vaping Use   Vaping Use: Never used  Substance and Sexual Activity   Alcohol use: Yes    Alcohol/week: 1.0 standard drink of alcohol    Types: 1 Glasses of wine per week    Comment: daily   Drug use: No   Sexual activity: Yes    Birth control/protection: Condom  Other Topics  Concern   Not on file  Social History Narrative   Not on file   Social Determinants of Health   Financial Resource Strain: High Risk (03/03/2023)   Overall Financial Resource Strain (CARDIA)    Difficulty of Paying Living Expenses: Very hard  Food Insecurity: No Food Insecurity (07/02/2022)   Hunger Vital Sign    Worried About Running Out of Food in the Last Year: Never true    Ran Out of Food in the Last Year: Never true  Transportation Needs: No Transportation Needs (07/02/2022)   PRAPARE - Administrator, Civil Service (Medical): No    Lack of Transportation (Non-Medical): No  Physical Activity: Not on file  Stress: Not on file  Social Connections: Not on file  Intimate Partner Violence: Not on file    Physical Exam      Future Appointments  Date Time Provider Department Center  05/07/2023 10:20 AM Bensimhon, Bevelyn Buckles, MD [facility] None  05/14/2023  3:20 PM Arnette Felts, FNP [facility] None  06/01/2023  1:05 PM [facility]ICE REMOT[facility]  08/31/2023  1:05 PM [facility]  11/30/2023  1:05 PM [facility]  02/29/2024  1:05 PM [facility]  05/30/2024  1:05 PM [facility]       Kerry Hough, Paramedic [phone] [facility] Paramedic  04/27/23

## 2023-04-27 ENCOUNTER — Telehealth (HOSPITAL_COMMUNITY): Payer: Self-pay

## 2023-04-27 ENCOUNTER — Other Ambulatory Visit (HOSPITAL_COMMUNITY): Payer: Self-pay

## 2023-04-27 NOTE — Telephone Encounter (Signed)
Advanced Heart Failure Patient Advocate Encounter  Medication Samples have been left at registration desk for patient pick up. Drug name: Marcelline Deist 10MG  Qty: 2x 14 ct packages LOT: [id number] Exp.: 10/07/2025 SIG: Take 1 tablet by mouth once daily   The patient has been instructed regarding the correct time, dose, and frequency of taking this medication, including desired effects and most common side effects.   Burnell Blanks, CPhT Rx Patient Advocate Phone: [phone]

## 2023-04-30 ENCOUNTER — Other Ambulatory Visit (HOSPITAL_COMMUNITY): Payer: Self-pay

## 2023-04-30 NOTE — Telephone Encounter (Signed)
I tried to reach the patient again and had to leave a message requesting a call back

## 2023-04-30 NOTE — Progress Notes (Signed)
Paramedicine Encounter    Patient ID: Sydney Shaffer, female    DOB: 10-08-1966, 57 y.o.   MRN: [id number]   Complaints-feeling same-some dizziness at times  Edema-some bloating   Compliance with meds-yes  Pill box filled-yes If so, by whom-paramedic   Refills needed-none right now      Pt reports feeling same.  I did bring her farxiga samples since there are issues with her insurance.  She did call DSS this week and the SW was telling her stuff that sounded like it was for disability application limits, so I called jenna and she was able to look it up to confirm she does have full medicaid and that matched the letter stating that she has full medicaid coverage. But she does not have a card yet.  I got some phone #'s for her to call to request a card.   To avoid any issues with meds she will have to call to cancel the bc/bs insurance and also to help with copay costs we will move her to pharmacy that waives co-pays.   Pt reports that she felt dizzy at times and also at night time she thinks the carvedilol makes her heart race.  She does feel bloated and has some slight edema to her legs, but she reports of not following fluid restrictions. She gets hot and thirsty a lot.   Will discuss this next week at clinic when she goes there and she is bringing family with her like dr Jesusita Oka asked of her.    BP 102/78   Pulse 80   Resp 16   Wt 174 lb (78.9 kg)   SpO2 97%   BMI 26.46 kg/m  B/p standing[- 110/80   Weight yesterday-174 Last visit weight-177  Patient Care Team: Patient, No Pcp Per as PCP - General (General Practice)  Patient Active Problem List   Diagnosis Date Noted   Dilated cardiomyopathy (HCC) 06/04/2015   Symptomatic anemia 06/04/2015   Chest pain 06/04/2015   Abnormal uterine bleeding (AUB) 06/04/2015   Metrorrhagia 06/04/2015   Abdominal pain    Hypokalemia    Menorrhagia    VT (ventricular tachycardia) (HCC) 07/22/2014   SVT (supraventricular tachycardia)  (HCC) 03/30/2014   HTN (hypertension) 03/30/2014   Chest pressure- unspecified 02/03/2014   Atrial tachycardia 10/01/2012   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   Hypertensive heart disease with CHF (congestive heart failure) (HCC) 08/04/2012   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   Acute on chronic systolic heart failure (HCC) 05/05/2012   Dyspnea 03/29/2012   Anemia 03/29/2012    Current Outpatient Medications:    amiodarone (PACERONE) 200 MG tablet, Take 1/2 tablet (100 mg total) by mouth daily., Disp: 15 tablet, Rfl: 6   carvedilol (COREG) 6.25 MG tablet, Take 1 tablet (6.25 mg total) by mouth 2 (two) times daily., Disp: 180 tablet, Rfl: 3   dapagliflozin propanediol (FARXIGA) 10 MG TABS tablet, Take 1 tablet (10 mg total) by mouth daily. Needs appt for future refills, Disp: 30 tablet, Rfl: 6   digoxin (LANOXIN) 0.125 MG tablet, Take 1 tablet (0.125 mg total) by mouth daily., Disp: 90 tablet, Rfl: 3   ferrous sulfate 325 (65 FE) MG tablet, Take 325 mg by mouth daily with breakfast., Disp: , Rfl:    losartan (COZAAR) 25 MG tablet, Take 1/2 tablet (12.5 mg total) by mouth at bedtime., Disp: 15 tablet, Rfl: 3   Multiple Vitamin (MULTIVITAMIN WITH MINERALS) TABS, Take 1 tablet by mouth daily., Disp: , Rfl:  potassium chloride SA (KLOR-CON M) 20 MEQ tablet, Take 3 tablets (60 mEq total) by mouth 2 (two) times daily., Disp: 180 tablet, Rfl: 3   spironolactone (ALDACTONE) 25 MG tablet, Take 1 tablet (25 mg total) by mouth daily. Hold if systolic blood pressure less than 100., Disp: 45 tablet, Rfl: 3   torsemide (DEMADEX) 20 MG tablet, Take 4 tablets (80 mg total) by mouth in the morning AND 2 tablets (40 mg total) every evening., Disp: 545 tablet, Rfl: 2 No Known Allergies    Social History   Socioeconomic History   Marital status: Single    Spouse name: Not on file   Number of children: 3   Years of education: Not on file   Highest education level: Not on  file  Occupational History   Occupation: receptionist    Employer: [employer]   Occupation: office cleaning  Tobacco Use   Smoking status: Never   Smokeless tobacco: Never  Vaping Use   Vaping Use: Never used  Substance and Sexual Activity   Alcohol use: Yes    Alcohol/week: 1.0 standard drink of alcohol    Types: 1 Glasses of wine per week    Comment: daily   Drug use: No   Sexual activity: Yes    Birth control/protection: Condom  Other Topics Concern   Not on file  Social History Narrative   Not on file   Social Determinants of Health   Financial Resource Strain: High Risk (03/03/2023)   Overall Financial Resource Strain (CARDIA)    Difficulty of Paying Living Expenses: Very hard  Food Insecurity: No Food Insecurity (07/02/2022)   Hunger Vital Sign    Worried About Running Out of Food in the Last Year: Never true    Ran Out of Food in the Last Year: Never true  Transportation Needs: No Transportation Needs (07/02/2022)   PRAPARE - Administrator, Civil Service (Medical): No    Lack of Transportation (Non-Medical): No  Physical Activity: Not on file  Stress: Not on file  Social Connections: Not on file  Intimate Partner Violence: Not on file    Physical Exam      Future Appointments  Date Time Provider Department Center  05/07/2023 10:20 AM Bensimhon, Bevelyn Buckles, MD [facility] None  05/14/2023  3:20 PM Arnette Felts, FNP [facility] None  06/01/2023  1:05 PM [facility]ICE REMOTES [facility]  08/31/2023  1:05 PM [facility]ICE REMOTES [facility]  11/30/2023  1:05 PM [facility]ICE REMOTES [facility]  02/29/2024  1:05 PM [facility] REMOT[facility]  05/30/2024  1:05 PM [facility]       Kerry Hough, Paramedic [phone] [facility] Paramedic  04/30/23

## 2023-05-04 NOTE — Progress Notes (Signed)
Advanced Heart Failure Clinic Note   PCP: Farley Ly, PA-C ([facility]) EP: Dr Graciela Husbands Medtronic ICD HF Cardiologist: Dr. Gala Romney  HPI: Sydney Shaffer is a 57 y.o.female with chronic systolic HF due to NICM s/p Medtronic ICD, HTN, and SVT s/p 2/15 ablation.  Cath 4/13 and 10/17 normal coronaries.  Lost to follow up until 5/19. Echo 6/19 EF 30-35% mild MR. S/p ICD gen change 12/20.  Reestablished with HF clinic in April 2023. Had been out of some medications.   Echo 4/23: EF 25-30%, LV severely dilated, grade III DD, RV okay, RVSP 49 mmHg, moderate MR, mild to moderate TR   Admitted 7/23 with ADHF after being out of meds. Referred for paramedicine.  Amiodarone increased d/t episodes of VT (monitor only) and runs SVT and AF.   Saw Dr. Graciela Husbands in 1/24 concerned about sinus tach. Also had 2 episodes of NSVT. Carvedilol increased. Ivabradine considered.   I saw her in 3/24 and I was concerned about end-stage HF. CPX ordered. CPX 4/24 showed Severe functional limitation due to a combination of restrictive lung physiology and HF. Suspect her HF is major limiting factor.   FVC 1.67 (52%)      FEV1 1.37 (54%)        FEV1/FVC 82 (103%)        MVV 50 (49%)      BP rest: 88/68 ->  BP peak: 122/68  pVO2: 12.4 (55% predicted peak VO2) VE/VCO2 slope:  31  Peak RER: 1.12  VE/MVV:  79% (VE/FEV1*40 = 72%)    Today she returns for HF follow up, here with paramedic Katie and her family. 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 Every hour or two needs to take a break. Very SOB. Hard to keep up when work picks up  No orthopnea or PND. Compliant with all meds. Has occasional ankle swelling. SBP runs ~ 100  Echo  03/02/23 EF 20-25% RV mild HK   Cardiac Studies:  - Echo (4/23): EF 25-30% - Echo (10/17): EF 25-30%, Grade 1 DD - Echo (6/16): EF 30-35% mild MR - Echo (2/15): EF 20-25%  - Echo (7/13): EF 20-25%   - R/LHC (10/17): normal cors  RA = 3 RV = 23/4 PA = 22/6 (14) PCW =  8 Ao = 123/69 (91) LV = 106/6 Fick cardiac output/index = 7.4/3.5 PVR = < 1.0 WU SVR = 958  FA sat = 88% PA sat = 63%, 62%   - CPX (5/18)  FVC 1.99 (61%)      FEV1 1.59 (61%)        FEV1/FVC 80 (99%)        MVV 58  (57%)       Resting HR: 81 Peak HR: 121   (72% age predicted max HR) BP rest: 108/68 BP peak: 144/80 Peak VO2: 12.3 (61% predicted peak VO2) VE/VCO2 slope:  25 OUES: 2.13 Peak RER: 0.98 Ventilatory Threshold: 11.2 (55% predicted or measured peak VO2) VE/MVV:  59% PETCO2 at peak:  40 O2pulse:  10   (91% predicted O2pulse)  - CPX (6/15): FVC 1.69 (51%)  FEV1 1.24 (47%)  FEV1/FVC 74%  Peak VO2: 12.6 (57.8% predicted peak VO2) VE/VCO2 slope: 28.6 OUES: 1.60 Peak RER: 1.01 Peak VO2 adjusted to the patient's ideal body weight of 150 lb (67.8 kg) the peak VO2 is 16.5 ml/kg (ibw)/min (62% of the ibw-adjusted predicted).  SH: She is not a smoker. She does not drink alcohol. Lives alone. She has  3 grown children   FH: Mom breast cancer. Father cancer.   ROS: All systems negative except as listed in HPI, PMH and Problem List.  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    Chronic systolic heart failure (HCC)    Dysrhythmia    Bradycardia   Hypertension    Hypertensive heart disease with CHF (HCC)    ICD (implantable cardiac defibrillator) in place 08/13/2012   Current Outpatient Medications  Medication Sig Dispense Refill   amiodarone (PACERONE) 200 MG tablet Take 1/2 tablet (100 mg total) by mouth daily. 15 tablet 6   carvedilol (COREG) 6.25 MG tablet Take 1 tablet (6.25 mg total) by mouth 2 (two) times daily. 180 tablet 3   dapagliflozin propanediol (FARXIGA) 10 MG  TABS tablet Take 1 tablet (10 mg total) by mouth daily. Needs appt for future refills 30 tablet 6   digoxin (LANOXIN) 0.125 MG tablet Take 1 tablet (0.125 mg total) by mouth daily. 90 tablet 3   ferrous sulfate 325 (65 FE) MG tablet Take 325 mg by mouth daily with breakfast.     losartan (COZAAR) 25 MG tablet Take 1/2 tablet (12.5 mg total) by mouth at bedtime. 15 tablet 3   Multiple Vitamin (MULTIVITAMIN WITH MINERALS) TABS Take 1 tablet by mouth daily.     potassium chloride SA (KLOR-CON M) 20 MEQ tablet Take 3 tablets (60 mEq total) by mouth 2 (two) times daily. 180 tablet 3   spironolactone (ALDACTONE) 25 MG tablet Take 1 tablet (25 mg total) by mouth daily. Hold if systolic blood pressure less than 100. 45 tablet 3   torsemide (DEMADEX) 20 MG tablet Take 4 tablets (80 mg total) by mouth in the morning AND 2 tablets (40 mg total) every evening. 545 tablet 2   No current facility-administered medications for this encounter.   BP 128/80   Pulse 91   Wt 84.6 kg (186 lb 6.4 oz)   SpO2 97%   BMI 28.34 kg/m   Wt Readings from Last 3 Encounters:  05/07/23 84.6 kg (186 lb 6.4 oz)  04/30/23 78.9 kg (174 lb)  04/23/23 80.3 kg (177 lb)   Vitals:   05/07/23 1024  BP: 128/80  Pulse: 91  SpO2: 97%  Weight: 84.6 kg (186 lb 6.4 oz)   PHYSICAL EXAM: General:  Weak appearing. No resp difficulty HEENT: normal Neck: supple. no JVD. Carotids 2+ bilat; no bruits. No lymphadenopathy or thryomegaly appreciated. Cor: PMI nondisplaced. Regular rate & rhythm. No rubs, gallops or murmurs. Lungs: clear Abdomen: soft, nontender, nondistended. No hepatosplenomegaly. No bruits or masses. Good bowel sounds. Extremities: no cyanosis, clubbing, rash, tr edema Neuro: alert & orientedx3, cranial nerves grossly intact. moves all 4 extremities w/o difficulty. Affect pleasant   Device interrogation: No VT/AF. Optivol mildly elevated. Activity level ~1hr/day Personally reviewed   ASSESSMENT & PLAN: 1.  Chronic Systolic Heart Failure:  - Normal cors by cath 2013. Nonischemic cardiomyopathy s/p Medtronic ICD.  - Echo (6/16): EF 30-35%. - Echo (10/17): EF 25-30%.   - Echo (6/19): EF 30-35%. - Echo (4/23): EF 25-30% - Echo /25/24 EF 20-25% RV mild HK Personally  reviewed -> echo concerning for LVNC but unable to get cMRI to confirm with ICD in place.  - ICD generator change 11/14/19 - Progressive NYHA IIIB-IV Volume status mildly elevated - CPX 4/24 FVC 1.67 (52%) FEV1 1.37 (54%) FEV1/FVC 82 (103%) BP rest: 88/68 ->  BP peak: 122/68 pVO2: 12.4 (55% predicted peak VO2) VE/VCO2 31 pRER: 1.12 VE/MVV:  79% - Continue losartan 12.5 qhs - Continue spiro 25 mg daily - Decrease Coreg to 3.125 mg bid. - Continue Farxiga 10 mg daily - Continue digoxin 0.125 daily  - Long talk with her and her family about need for advanced therapies (transplant/VAD). Reviewed all tests with her and her family. They want to think about it. If decide to proceed will plan R/L cath and then f/u visit with advanced HF VAD team to begin transplant w/u - She is not able to return to work. Letter provided - Possible LVNC on echo. Start Eliquis 5 bid                                                                                            -  2. SVT and VT  - s/p atrial tachycardia ablation in 01/2014.   - No VT on ICD interrogation today - Continue amio 100 daily - She follows with Dr. Graciela Husbands.   3. SDOH - Compliance has improved greatly  with Paramedicine program. Appreciate assistance.  Total time spent 45 minutes. Over half that time spent discussing above.    Arvilla Meres, MD 05/07/2023

## 2023-05-05 ENCOUNTER — Other Ambulatory Visit (HOSPITAL_COMMUNITY): Payer: Self-pay

## 2023-05-07 ENCOUNTER — Other Ambulatory Visit (HOSPITAL_COMMUNITY): Payer: Self-pay

## 2023-05-07 ENCOUNTER — Encounter (HOSPITAL_COMMUNITY): Payer: Self-pay | Admitting: Internal Medicine

## 2023-05-07 ENCOUNTER — Telehealth (HOSPITAL_COMMUNITY): Payer: Self-pay

## 2023-05-07 ENCOUNTER — Ambulatory Visit (HOSPITAL_COMMUNITY)
Admission: RE | Admit: 2023-05-07 | Discharge: 2023-05-07 | Disposition: A | Discharge status: Home / Self Care | Payer: BC Managed Care – PPO | Source: Ambulatory Visit | Attending: Internal Medicine | Admitting: Internal Medicine

## 2023-05-07 VITALS — BP 128/80 | HR 91 | Wt 186.4 lb

## 2023-05-07 DIAGNOSIS — I110 Hypertensive heart disease with heart failure: Secondary | ICD-10-CM | POA: Insufficient documentation

## 2023-05-07 DIAGNOSIS — R0602 Shortness of breath: Secondary | ICD-10-CM | POA: Insufficient documentation

## 2023-05-07 DIAGNOSIS — I5022 Chronic systolic (congestive) heart failure: Secondary | ICD-10-CM | POA: Insufficient documentation

## 2023-05-07 DIAGNOSIS — Z7901 Long term (current) use of anticoagulants: Secondary | ICD-10-CM | POA: Diagnosis not present

## 2023-05-07 DIAGNOSIS — Z79899 Other long term (current) drug therapy: Secondary | ICD-10-CM | POA: Insufficient documentation

## 2023-05-07 DIAGNOSIS — Z95810 Presence of automatic (implantable) cardiac defibrillator: Secondary | ICD-10-CM | POA: Diagnosis not present

## 2023-05-07 DIAGNOSIS — I420 Dilated cardiomyopathy: Secondary | ICD-10-CM | POA: Insufficient documentation

## 2023-05-07 DIAGNOSIS — I472 Ventricular tachycardia, unspecified: Secondary | ICD-10-CM | POA: Diagnosis not present

## 2023-05-07 DIAGNOSIS — I471 Supraventricular tachycardia, unspecified: Secondary | ICD-10-CM | POA: Insufficient documentation

## 2023-05-07 LAB — BASIC METABOLIC PANEL
Anion gap: 8 (ref 5–15)
BUN: 14 mg/dL (ref 6–20)
CO2: 25 mmol/L (ref 22–32)
Calcium: 9.4 mg/dL (ref 8.9–10.3)
Chloride: 104 mmol/L (ref 98–111)
Creatinine, Ser: 0.97 mg/dL (ref 0.44–1.00)
GFR, Estimated: >60 mL/min (ref >60)
Glucose, Bld: 100 mg/dL — ABNORMAL HIGH (ref 70–99)
Potassium: 3.1 mmol/L — ABNORMAL LOW (ref 3.5–5.1)
Sodium: 137 mmol/L (ref 135–145)

## 2023-05-07 LAB — DIGOXIN LEVEL: Digoxin Level: 0.3 ng/mL — ABNORMAL LOW (ref 0.8–2.0)

## 2023-05-07 LAB — CBC
HCT: 41 % (ref 36.0–46.0)
Hemoglobin: 13.6 g/dL (ref 12.0–15.0)
MCH: 27.6 pg (ref 26.0–34.0)
MCHC: 33.2 g/dL (ref 30.0–36.0)
MCV: 83.3 fL (ref 80.0–100.0)
Platelets: 219 10*3/uL (ref 150–400)
RBC: 4.92 MIL/uL (ref 3.87–5.11)
RDW: 15.1 % (ref 11.5–15.5)
WBC: 6.7 10*3/uL (ref 4.0–10.5)
nRBC: 0 % (ref 0.0–0.2)

## 2023-05-07 LAB — BRAIN NATRIURETIC PEPTIDE: B Natriuretic Peptide: 949.9 pg/mL — ABNORMAL HIGH (ref 0.0–100.0)

## 2023-05-07 MED ORDER — CARVEDILOL 3.125 MG PO TABS: 3.1250 mg | ORAL_TABLET | Freq: Two times a day (BID) | ORAL | 3 refills | Status: DC

## 2023-05-07 MED ORDER — APIXABAN 5 MG PO TABS: 5.0000 mg | ORAL_TABLET | Freq: Two times a day (BID) | ORAL | 6 refills | Status: DC

## 2023-05-07 NOTE — Telephone Encounter (Signed)
Samples were given to [facility] in Paramedicine today for patient.  Medication Samples have been provided to the patient.  Drug name: Eliquis       Strength: 5 mg        Qty: 28  LOT: [id number]  Exp.Date: September 2025  Dosing instructions: Take 1 tablet by mouth twice a day.  The patient has been instructed regarding the correct time, dose, and frequency of taking this medication, including desired effects and most common side effects.   Johann Santone M Aidric Endicott 2:02 PM 05/07/2023

## 2023-05-07 NOTE — Patient Instructions (Signed)
Medication Changes:  Decrease Carvedilol to 3.125 mg Twice daily   START Eliquis 5 mg Twice daily   Lab Work:  Labs done today, your results will be available in MyChart, we will contact you for abnormal readings.  Testing/Procedures:  none  Referrals:  none  Special Instructions // Education:  Do the following things EVERYDAY: Weigh yourself in the morning before breakfast. Write it down and keep it in a log. Take your medicines as prescribed Eat low salt foods--Limit salt (sodium) to 2000 mg per day.  Stay as active as you can everyday Limit all fluids for the day to less than 2 liters  Please let us know if you want to proceed with heart catheterization  Follow-Up in: 4 weeks  At the [facility], you and your health needs are our priority. We have a designated team specialized in the treatment of Heart Failure. This Care Team includes your primary Heart Failure Specialized Cardiologist (physician), Advanced Practice Providers (APPs- Physician Assistants and Nurse Practitioners), and Pharmacist who all work together to provide you with the care you need, when you need it.   You may see any of the following providers on your designated Care Team at your next follow up:  Dr. Arvilla Meres Dr. Marca Ancona Dr. Marcos Eke, NP Robbie Lis, [place] [facility] [place], [place] Brynda Peon, NP Karle Plumber, PharmD   Please be sure to bring in all your medications bottles to every appointment.   Need to Contact us:  If you have any questions or concerns before your next appointment please send [place] a message through [place] or call our office at [phone].    TO LEAVE A MESSAGE FOR THE NURSE SELECT OPTION 2, PLEASE LEAVE A MESSAGE INCLUDING: YOUR NAME DATE OF BIRTH CALL BACK NUMBER REASON FOR CALL**this is important as we prioritize the call backs  YOU WILL RECEIVE A CALL BACK THE SAME DAY AS LONG AS YOU CALL BEFORE 4:00  PM

## 2023-05-07 NOTE — Progress Notes (Signed)
Paramedicine Encounter   Patient ID: Sydney Shaffer , female,   DOB: 1966-08-03,56 y.o.,  MRN: [id number]   Met patient in clinic today with provider.  Weight @ clinic-186  B/P-128/80 P-91 [id number]  Pt reports that she has been having tingling in her left leg and both of her arms.  She denies any injuries recently.  This has been going on for a week.  She is having to stand for long periods of the day for the job she has.  But she denies any lifting heavy items, she actually doesn't have to lift at all.  She is having difficulty resting at night also.   Pt [facility] so her copay can be waived-so I sent him message to transfer her file.   No meds yet for this morning.   She brought her family with her today for this appointment about steps moving forward with her CHF.   ECHO-20-25%  Exercise test showed her limitations were a combo heart/lung but primarily heart related.   Options are-continue med treatment, LVAD or transplant She will go with family  Med changes-  --80/80 torsemide for next couple days  --decrease carvedilol 3.125 mg BID --start eliquis    Kerry Hough, EMT-Paramedic [phone] 05/07/2023

## 2023-05-08 ENCOUNTER — Telehealth: Payer: Self-pay

## 2023-05-08 ENCOUNTER — Telehealth (HOSPITAL_COMMUNITY): Payer: Self-pay | Admitting: *Deleted

## 2023-05-08 DIAGNOSIS — I5022 Chronic systolic (congestive) heart failure: Secondary | ICD-10-CM

## 2023-05-08 DIAGNOSIS — R7989 Other specified abnormal findings of blood chemistry: Secondary | ICD-10-CM

## 2023-05-08 DIAGNOSIS — E876 Hypokalemia: Secondary | ICD-10-CM

## 2023-05-08 MED ORDER — POTASSIUM CHLORIDE CRYS ER 20 MEQ PO TBCR: EXTENDED_RELEASE_TABLET | ORAL | 3 refills | Status: DC

## 2023-05-08 NOTE — Telephone Encounter (Signed)
Attempted phone call to pt and left voicemail message per EPIC - OK  Pt advised per Dr Graciela Husbands, liver function is elevated and will need repeat labs in 3 months.  Pt advised order will be placed and she contact scheduling at [phone] to schedule lab appointment.  Pt may call with any further questions or concerns.

## 2023-05-08 NOTE — Telephone Encounter (Signed)
-----   Message from Dolores Patty, MD sent at 05/08/2023  2:05 PM EDT ----- K low. Start kcl 40 daily. (Take 2 tabs the first day only) repeat bmet next week

## 2023-05-08 NOTE — Telephone Encounter (Signed)
-----   Message from Duke Salvia, MD sent at 05/03/2023  4:17 PM EDT ----- Please Inform Patient that LFTs  mildly abnormal and will require further eval-- repeat them in 3 months   Thanks

## 2023-05-08 NOTE — Telephone Encounter (Signed)
Geroge Baseman [place], RN 05/08/2023  2:41 PM EDT Back to Top    Spoke w/pt, she is aware, she currently takes KCL 60 meq BID, she does admit to missed 2-3 doses recently. She will take extra 40 meq now then increase to 80 meq AM and 60 meq PM, repeat lab sch 6/7

## 2023-05-13 ENCOUNTER — Telehealth (HOSPITAL_COMMUNITY): Payer: Self-pay

## 2023-05-13 NOTE — Telephone Encounter (Signed)
Pt reached out today ref more concerns/questions she has about cost/coverage of transplant or LVAD per last convo with Dr. Gala Romney.  Per her request since she is having issues with her mychart account I sent message to provider letting him know she would like to talk more about the next steps.   She also advised she got a reminder of her appoint tomor at 320 which would be interfering with our appoint at 4 so we will resch.   Kerry Hough, EMT-Paramedic  [phone] 05/13/2023

## 2023-05-14 ENCOUNTER — Encounter: Payer: Self-pay | Admitting: Nurse Practitioner

## 2023-05-14 ENCOUNTER — Ambulatory Visit: Payer: BC Managed Care – PPO | Admitting: Nurse Practitioner

## 2023-05-14 VITALS — BP 126/70 | HR 91 | Temp 98.2°F | Ht 68.0 in | Wt 183.8 lb

## 2023-05-14 DIAGNOSIS — Z1211 Encounter for screening for malignant neoplasm of colon: Secondary | ICD-10-CM

## 2023-05-14 DIAGNOSIS — I420 Dilated cardiomyopathy: Secondary | ICD-10-CM

## 2023-05-14 DIAGNOSIS — Z7689 Persons encountering health services in other specified circumstances: Secondary | ICD-10-CM

## 2023-05-14 DIAGNOSIS — M545 Low back pain, unspecified: Secondary | ICD-10-CM | POA: Diagnosis not present

## 2023-05-14 DIAGNOSIS — I110 Hypertensive heart disease with heart failure: Secondary | ICD-10-CM

## 2023-05-14 DIAGNOSIS — G8929 Other chronic pain: Secondary | ICD-10-CM | POA: Diagnosis not present

## 2023-05-14 DIAGNOSIS — I5022 Chronic systolic (congestive) heart failure: Secondary | ICD-10-CM

## 2023-05-14 DIAGNOSIS — Z1231 Encounter for screening mammogram for malignant neoplasm of breast: Secondary | ICD-10-CM | POA: Diagnosis not present

## 2023-05-14 DIAGNOSIS — Z23 Encounter for immunization: Secondary | ICD-10-CM

## 2023-05-14 DIAGNOSIS — Z862 Personal history of diseases of the blood and blood-forming organs and certain disorders involving the immune mechanism: Secondary | ICD-10-CM | POA: Diagnosis not present

## 2023-05-14 DIAGNOSIS — I471 Supraventricular tachycardia, unspecified: Secondary | ICD-10-CM | POA: Diagnosis not present

## 2023-05-14 MED ORDER — CYCLOBENZAPRINE HCL 10 MG PO TABS: 10.0000 mg | ORAL_TABLET | Freq: Three times a day (TID) | ORAL | 0 refills | Status: AC | PRN

## 2023-05-14 NOTE — Progress Notes (Unsigned)
I,Kerron Sedano,acting as a Neurosurgeon for Arnette Felts, FNP.,have documented all relevant documentation on the behalf of Arnette Felts, FNP,as directed by  Arnette Felts, FNP while in the presence of Arnette Felts, FNP.  Subjective:  Patient ID: Sydney Shaffer , female    DOB: [birth year] , 57 y.o.   MRN: [id number]  Chief Complaint  Patient presents with   Establish Care    HPI  Patient presents today to establish care. She was referred by family/friend. She has not had a PCP in several years. She is applying for disability.  Single. She has 3 children - 2 sons and 1 daughter all are healthy.   PMH - menopause, she has a defribrillator for approx 10 years.   Today patient would like to discuss about what her heart doctor has told her, she reports he informed her that her lungs have elevated. Patient would also like to discuss her heart issues. Patient reports her heart doctor took her out of work as of the next 2 week, but she hasn't worked since last Friday.    She is an occasional drinker maybe a glass of wine. She is out of work since last week at [employer] in [employer] Pain This is a new problem. The current episode started 1 to 4 weeks ago. The problem occurs intermittently. The problem is unchanged. The pain is present in the lumbar spine. The quality of the pain is described as aching. The pain does not radiate. The pain is at a severity of 6/10. The pain is moderate. The pain is The same all the time. Pertinent negatives include no abdominal pain, perianal numbness or tingling.     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    Chronic  systolic heart failure (HCC)    Dysrhythmia    Bradycardia   Hypertension    Hypertensive heart disease with CHF (HCC)    ICD (implantable cardiac defibrillator) in place 08/13/2012   Menorrhagia    Metrorrhagia 06/04/2015   SVT (supraventricular tachycardia) (HCC) 03/30/2014   VT (ventricular tachycardia) (HCC) 07/22/2014     Family History  Problem Relation Age of Onset   Breast cancer Mother    Cancer Father      Current Outpatient Medications:    amiodarone (PACERONE) 200 MG tablet, Take 1/2 tablet (100 mg total) by mouth daily., Disp: 15 tablet, Rfl: 6   apixaban (ELIQUIS) 5 MG TABS tablet, Take 1 tablet (5 mg total) by mouth 2 (two) times daily., Disp: 60 tablet, Rfl: 6   carvedilol (COREG) 3.125 MG tablet, Take 1 tablet (3.125 mg total) by mouth 2 (two) times daily., Disp: 60 tablet, Rfl: 3   cyclobenzaprine (FLEXERIL) 10 MG tablet, Take 1 tablet (10 mg total) by mouth 3 (three) times daily as needed for muscle spasms., Disp: 20 tablet, Rfl: 0   dapagliflozin propanediol (FARXIGA) 10 MG TABS tablet, Take 1 tablet (10 mg total) by mouth daily. Needs appt for future refills, Disp: 30 tablet, Rfl: 6   digoxin (LANOXIN) 0.125 MG tablet, Take 1 tablet (0.125 mg total)  by mouth daily., Disp: 90 tablet, Rfl: 3   doxylamine, Sleep, (UNISOM) 25 MG tablet, Take 25 mg by mouth at bedtime as needed., Disp: , Rfl:    ferrous sulfate 325 (65 FE) MG tablet, Take 325 mg by mouth daily with breakfast., Disp: , Rfl:    losartan (COZAAR) 25 MG tablet, Take 1/2 tablet (12.5 mg total) by mouth at bedtime., Disp: 15 tablet, Rfl: 3   Multiple Vitamin (MULTIVITAMIN WITH MINERALS) TABS, Take 1 tablet by mouth daily., Disp: , Rfl:    potassium chloride SA (KLOR-CON M) 20 MEQ tablet, Take 4 tablets (80 mEq total) by mouth every morning AND 3 tablets (60 mEq total) every evening., Disp: 180 tablet, Rfl: 3   spironolactone (ALDACTONE) 25 MG tablet, Take 1 tablet (25 mg total) by mouth daily. Hold if  systolic blood pressure less than 100., Disp: 45 tablet, Rfl: 3   torsemide (DEMADEX) 20 MG tablet, Take 4 tablets (80 mg total) by mouth in the morning AND 2 tablets (40 mg total) every evening., Disp: 545 tablet, Rfl: 2   No Known Allergies   Review of Systems  Gastrointestinal:  Negative for abdominal pain.  Musculoskeletal:  Positive for back pain.  Neurological:  Negative for tingling.     Today's Vitals   05/14/23 1549  BP: 126/70  Pulse: 91  Temp: 98.2 F (36.8 C)  TempSrc: Oral  Weight: 183 lb 12.8 oz (83.4 kg)  Height: 5\' 8"  (1.727 m)  PainSc: 0-No pain   Body mass index is 27.95 kg/m.  Wt Readings from Last 3 Encounters:  05/14/23 183 lb 12.8 oz (83.4 kg)  05/07/23 186 lb 6.4 oz (84.6 kg)  04/30/23 174 lb (78.9 kg)    The 10-year ASCVD risk score (Arnett DK, et al., 2019) is: 3.6%   Values used to calculate the score:     Age: 72 years     Sex: Female     Is Non-Hispanic African American: Yes     Diabetic: No     Tobacco smoker: No     Systolic Blood Pressure: 126 mmHg     Is BP treated: Yes     HDL Cholesterol: 84 MG/DL     Total Cholesterol: 186 MG/DL  Objective:  Physical Exam Vitals reviewed.  Constitutional:      General: She is not in acute distress.    Appearance: Normal appearance. She is well-developed. She is obese.  HENT:     Head: Normocephalic and atraumatic.  Eyes:     Pupils: Pupils are equal, round, and reactive to light.  Cardiovascular:     Rate and Rhythm: Normal rate and regular rhythm.     Pulses: Normal pulses.     Heart sounds: Normal heart sounds. No murmur heard. Pulmonary:     Effort: Pulmonary effort is normal. No respiratory distress.     Breath sounds: Normal breath sounds. No wheezing.  Musculoskeletal:        General: Normal range of motion.  Skin:    General: Skin is warm and dry.     Capillary Refill: Capillary refill takes less than 2 seconds.  Neurological:     General: No focal deficit present.     Mental  Status: She is alert and oriented to person, place, and time.     Cranial Nerves: No cranial nerve deficit.     Motor: No weakness.  Psychiatric:        Mood and Affect: Mood normal.  Assessment And Plan:  1. Dilated cardiomyopathy (HCC) Comments: Continue f/u with Cardiology, tried to help explain what the Cardiologist said in the notes. There is possible plan for surgical intervention.  2. SVT (supraventricular tachycardia) (HCC) Comments: Previous history.  3. Nonischemic dilated cardiomyopathy (HCC) Comments: Continue f/u with Cardiology  4. Hypertensive heart disease with chronic systolic congestive heart failure (HCC) Comments: Blood pressure is well controlled, continue current medications.  5. Need for zoster vaccination - Zoster Recombinant (Shingrix )  6. Screening for colon cancer According to USPTF Colorectal cancer Screening guidelines. Colonoscopy is recommended every 10 years, starting at age 64 years. Will refer to GI for colon cancer screening. - Ambulatory referral to Gastroenterology  7. Encounter for screening mammogram for breast cancer Pt instructed on Self Breast Exam.According to ACOG guidelines Women aged 54 and older are recommended to get an annual mammogram. Order placed - MM 3D SCREENING MAMMOGRAM BILATERAL BREAST; Future  8. History of anemia  9. Establishing care with new doctor, encounter for Patient is here to establish care. Went over patient medical, family, social and surgical history. Reviewed with patient their medications and any allergies  Reviewed with patient their sexual orientation, drug/tobacco and alcohol use Dicussed any new concerns with patient  recommended patient comes in for a physical exam and complete blood work.  Educated patient about the importance of annual screenings and immunizations.  Advised patient to eat a healthy diet along with exercise for atleast 30-45 min atleast 4-5 days of the week.    10.  Chronic midline low back pain without sciatica She will try the muscle relaxer and can use solanpas patches.   Return in about 3 months (around 08/14/2023) for phy when able; f/u back pain and headaches with me in 2-3 weeks.   Patient was given opportunity to ask questions. Patient verbalized understanding of the plan and was able to repeat key elements of the plan. All questions were answered to their satisfaction.  Arnette Felts, FNP  I, Arnette Felts, FNP, have reviewed all documentation for this visit. The documentation on 05/24/23 for the exam, diagnosis, procedures, and orders are all accurate and complete.   IF YOU HAVE BEEN REFERRED TO A SPECIALIST, IT MAY TAKE 1-2 WEEKS TO SCHEDULE/PROCESS THE REFERRAL. IF YOU HAVE NOT HEARD FROM US/SPECIALIST IN TWO WEEKS, PLEASE GIVE [place] A CALL AT [phone].

## 2023-05-15 ENCOUNTER — Other Ambulatory Visit (HOSPITAL_COMMUNITY): Payer: BC Managed Care – PPO

## 2023-05-21 ENCOUNTER — Telehealth (HOSPITAL_COMMUNITY): Payer: Self-pay

## 2023-05-21 NOTE — Telephone Encounter (Addendum)
I had a meeting today until 430 and I texted pt to let her know I am fixing to leave (I was 5-52min away) she said she needed to resch b/c she had to leave in a few to go get her grandkids from childcare.  She did report that she took the eliquis copay card to pharm and they wouldn't fill it-- I reached out to pharmacy and he advised it was b/c she had medicaid but this was not relayed to patient so she didn't p/u the med.   I contacted pharmacy to rectify this situation and get it filled. After speaking with Alecia Lemming, he said to have her come by tomor and he can walk her thru calling insurance to get this straight. I sent her message to let her know this also.   Kerry Hough, EMT-Paramedic  [phone] 05/21/2023

## 2023-05-22 ENCOUNTER — Other Ambulatory Visit (HOSPITAL_COMMUNITY): Payer: BC Managed Care – PPO

## 2023-05-27 ENCOUNTER — Encounter (HOSPITAL_COMMUNITY): Payer: BC Managed Care – PPO | Admitting: Internal Medicine

## 2023-05-28 ENCOUNTER — Other Ambulatory Visit (HOSPITAL_COMMUNITY): Payer: Self-pay

## 2023-05-28 NOTE — Progress Notes (Signed)
Paramedicine Encounter    Patient ID: Sydney Shaffer, female    DOB: 1966-05-04, 57 y.o.   MRN: [id number]   Complaints-none new   Edema-slight   Compliance with meds-some   Pill box filled-yes If so, by whom-paramedic   Refills needed- -amio -torsemide    Pt reports she is doing ok. Same as before. She is still skeptical of the heart pump, she had more questions and I answered what I was familiar with.   Meds verified and pill box refilled.  Not really been taking her b/p at home.   She does have some swelling to her legs.  She does admit back to eating a lot of ice since its getting hotter.  Will f/u next week.     She reports her PCP said she had high cholesterol but she has not been prescribed anything yet.    BP 110/80   Pulse 94   Resp 16   LMP  (LMP Unknown)   SpO2 97%  Weight yesterday-? 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   Anemia 03/29/2012    Current Outpatient Medications:    amiodarone (PACERONE) 200 MG tablet, Take 1/2 tablet (100 mg total) by mouth daily., Disp: 15 tablet, Rfl: 6   apixaban (ELIQUIS) 5 MG TABS tablet, Take 1 tablet (5 mg total) by mouth 2 (two) times daily., Disp: 60 tablet, Rfl: 6   carvedilol (COREG) 3.125 MG tablet, Take 1 tablet (3.125 mg total) by mouth 2 (two) times daily., Disp: 60 tablet, Rfl: 3   cyclobenzaprine (FLEXERIL) 10 MG tablet, Take 1 tablet (10 mg total) by mouth 3 (three) times daily as needed for muscle spasms., Disp: 20 tablet, Rfl: 0    dapagliflozin propanediol (FARXIGA) 10 MG TABS tablet, Take 1 tablet (10 mg total) by mouth daily. Needs appt for future refills, Disp: 30 tablet, Rfl: 6   digoxin (LANOXIN) 0.125 MG tablet, Take 1 tablet (0.125 mg total) by mouth daily., Disp: 90 tablet, Rfl: 3   doxylamine, Sleep, (UNISOM) 25 MG tablet, Take 25 mg by mouth at bedtime as needed., Disp: , Rfl:    ferrous sulfate 325 (65 FE) MG tablet, Take 325 mg by mouth daily with breakfast., Disp: , Rfl:    losartan (COZAAR) 25 MG tablet, Take 1/2 tablet (12.5 mg total) by mouth at bedtime., Disp: 15 tablet, Rfl: 3   Multiple Vitamin (MULTIVITAMIN WITH MINERALS) TABS, Take 1 tablet by mouth daily., Disp: , Rfl:    potassium chloride SA (KLOR-CON M) 20 MEQ tablet, Take 4 tablets (80 mEq total) by mouth every morning AND 3 tablets (60 mEq total) every evening., Disp: 180 tablet, Rfl: 3   spironolactone (ALDACTONE) 25 MG tablet, Take 1 tablet (25 mg total) by mouth daily. Hold if systolic blood pressure less than 100., Disp: 45 tablet, Rfl: 3   torsemide (DEMADEX) 20 MG tablet, Take 4 tablets (80 mg total) by mouth in the morning AND 2 tablets (40 mg total) every evening., Disp: 545 tablet, Rfl: 2 No Known Allergies    Social History   Socioeconomic  History   Marital status: Single    Spouse name: Not on file   Number of children: 3   Years of education: Not on file   Highest education level: Not on file  Occupational History   Occupation: receptionist    Employer: [employer]   Occupation: office cleaning  Tobacco Use   Smoking status: Never   Smokeless tobacco: Never  Vaping Use   Vaping Use: Never used  Substance and Sexual Activity   Alcohol use: Yes    Alcohol/week: 5.0 standard drinks of alcohol    Types: 5 Glasses of wine per week    Comment: daily   Drug use: Never   Sexual activity: Yes    Birth control/protection: Condom  Other Topics Concern   Not on file  Social History Narrative   Not on file   Social  Determinants of Health   Financial Resource Strain: High Risk (03/03/2023)   Overall Financial Resource Strain (CARDIA)    Difficulty of Paying Living Expenses: Very hard  Food Insecurity: No Food Insecurity (07/02/2022)   Hunger Vital Sign    Worried About Running Out of Food in the Last Year: Never true    Ran Out of Food in the Last Year: Never true  Transportation Needs: No Transportation Needs (07/02/2022)   PRAPARE - Administrator, Civil Service (Medical): No    Lack of Transportation (Non-Medical): No  Physical Activity: Not on file  Stress: Not on file  Social Connections: Not on file  Intimate Partner Violence: Not on file    Physical Exam      Future Appointments  Date Time Provider Department Center  06/01/2023  1:05 PM [facility] DEVICE REMOTES [facility]  06/02/2023  5:10 PM [facility] MM 3 [facility]  06/05/2023  3:15 PM [facility] LAB [facility] None  06/15/2023 10:00 AM Bensimhon, Bevelyn Buckles, MD [facility] None  08/31/2023  1:05 PM [facility] DEVICE REMOTES [facility]  11/30/2023  1:05 PM [facility] DEVICE REMOTES [facility]  02/29/2024  1:05 PM [facility] DEVICE REMOTES [facility]  05/30/2024  1:05 PM [facility] DEVICE REMOTES [facility]       Kerry Hough, Paramedic [phone] [facility] Paramedic  05/28/23

## 2023-06-01 ENCOUNTER — Other Ambulatory Visit (HOSPITAL_COMMUNITY): Payer: Self-pay

## 2023-06-01 ENCOUNTER — Telehealth (HOSPITAL_COMMUNITY): Payer: Self-pay

## 2023-06-01 NOTE — Telephone Encounter (Signed)
Contacted [facility] to request the following for refills:  amio torsemide   Kerry Hough, Paramedic [phone] 06/01/23

## 2023-06-02 ENCOUNTER — Ambulatory Visit: Payer: BC Managed Care – PPO

## 2023-06-05 ENCOUNTER — Other Ambulatory Visit (HOSPITAL_COMMUNITY): Payer: BC Managed Care – PPO

## 2023-06-10 ENCOUNTER — Telehealth (HOSPITAL_COMMUNITY): Payer: Self-pay

## 2023-06-10 NOTE — Telephone Encounter (Signed)
I had to call pt to cancel appointment today unfortunately due to 911 EMS response needed for call volume.   Will see her on Monday at clinic appointment.    Kerry Hough, EMT-Paramedic  [phone] 06/10/2023

## 2023-06-15 ENCOUNTER — Ambulatory Visit (INDEPENDENT_AMBULATORY_CARE_PROVIDER_SITE_OTHER): Payer: BC Managed Care – PPO

## 2023-06-15 ENCOUNTER — Other Ambulatory Visit (HOSPITAL_COMMUNITY): Payer: Self-pay

## 2023-06-15 ENCOUNTER — Other Ambulatory Visit (HOSPITAL_COMMUNITY): Payer: Self-pay | Admitting: *Deleted

## 2023-06-15 ENCOUNTER — Ambulatory Visit (HOSPITAL_COMMUNITY)
Admission: RE | Admit: 2023-06-15 | Discharge: 2023-06-15 | Disposition: A | Discharge status: Home / Self Care | Payer: Medicaid Other | Source: Ambulatory Visit | Attending: Internal Medicine | Admitting: Internal Medicine

## 2023-06-15 VITALS — BP 123/75 | HR 92 | Ht 68.0 in | Wt 180.0 lb

## 2023-06-15 DIAGNOSIS — I420 Dilated cardiomyopathy: Secondary | ICD-10-CM

## 2023-06-15 DIAGNOSIS — I471 Supraventricular tachycardia, unspecified: Secondary | ICD-10-CM

## 2023-06-15 DIAGNOSIS — I472 Ventricular tachycardia, unspecified: Secondary | ICD-10-CM

## 2023-06-15 DIAGNOSIS — Z7901 Long term (current) use of anticoagulants: Secondary | ICD-10-CM | POA: Insufficient documentation

## 2023-06-15 DIAGNOSIS — I5022 Chronic systolic (congestive) heart failure: Secondary | ICD-10-CM | POA: Diagnosis not present

## 2023-06-15 DIAGNOSIS — Z95810 Presence of automatic (implantable) cardiac defibrillator: Secondary | ICD-10-CM | POA: Diagnosis not present

## 2023-06-15 DIAGNOSIS — Z7984 Long term (current) use of oral hypoglycemic drugs: Secondary | ICD-10-CM | POA: Diagnosis not present

## 2023-06-15 DIAGNOSIS — Z79899 Other long term (current) drug therapy: Secondary | ICD-10-CM | POA: Insufficient documentation

## 2023-06-15 DIAGNOSIS — I110 Hypertensive heart disease with heart failure: Secondary | ICD-10-CM | POA: Diagnosis not present

## 2023-06-15 LAB — BASIC METABOLIC PANEL
Anion gap: 10 (ref 5–15)
BUN: 16 mg/dL (ref 6–20)
CO2: 27 mmol/L (ref 22–32)
Calcium: 9.5 mg/dL (ref 8.9–10.3)
Chloride: 101 mmol/L (ref 98–111)
Creatinine, Ser: 0.95 mg/dL (ref 0.44–1.00)
GFR, Estimated: >60 mL/min (ref >60)
Glucose, Bld: 97 mg/dL (ref 70–99)
Potassium: 3.3 mmol/L — ABNORMAL LOW (ref 3.5–5.1)
Sodium: 138 mmol/L (ref 135–145)

## 2023-06-15 LAB — CUP PACEART REMOTE DEVICE CHECK
Battery Remaining Longevity: 93 mo
Battery Voltage: 3 V
Brady Statistic AP VP Percent: 0 %
Brady Statistic AP VS Percent: 0.05 %
Brady Statistic AS VP Percent: 0 %
Brady Statistic AS VS Percent: 99.95 %
Brady Statistic RA Percent Paced: 0.05 %
Brady Statistic RV Percent Paced: 0 %
Date Time Interrogation Session: 20240708110908
HighPow Impedance: 75 Ohm
Implantable Lead Connection Status: 753985
Implantable Lead Connection Status: 753985
Implantable Lead Implant Date: 20130906
Implantable Lead Implant Date: 20130906
Implantable Lead Location: 753859
Implantable Lead Location: 753860
Implantable Lead Model: 181
Implantable Lead Model: 5076
Implantable Lead Serial Number: 322962
Implantable Pulse Generator Implant Date: 20201207
Lead Channel Impedance Value: 361 Ohm
Lead Channel Impedance Value: 361 Ohm
Lead Channel Impedance Value: 418 Ohm
Lead Channel Pacing Threshold Amplitude: 0.5 V
Lead Channel Pacing Threshold Amplitude: 1.625 V
Lead Channel Pacing Threshold Pulse Width: 0.4 ms
Lead Channel Pacing Threshold Pulse Width: 0.4 ms
Lead Channel Sensing Intrinsic Amplitude: 15.5 mV
Lead Channel Sensing Intrinsic Amplitude: 15.5 mV
Lead Channel Sensing Intrinsic Amplitude: 3.25 mV
Lead Channel Sensing Intrinsic Amplitude: 3.25 mV
Lead Channel Setting Pacing Amplitude: 1.5 V
Lead Channel Setting Pacing Amplitude: 2.5 V
Lead Channel Setting Pacing Pulse Width: 1 ms
Lead Channel Setting Sensing Sensitivity: 0.3 mV
Zone Setting Status: 755011

## 2023-06-15 LAB — CBC
HCT: 44.3 % (ref 36.0–46.0)
Hemoglobin: 14.6 g/dL (ref 12.0–15.0)
MCH: 27.8 pg (ref 26.0–34.0)
MCHC: 33 g/dL (ref 30.0–36.0)
MCV: 84.4 fL (ref 80.0–100.0)
Platelets: 265 10*3/uL (ref 150–400)
RBC: 5.25 MIL/uL — ABNORMAL HIGH (ref 3.87–5.11)
RDW: 14.8 % (ref 11.5–15.5)
WBC: 6.4 10*3/uL (ref 4.0–10.5)
nRBC: 0 % (ref 0.0–0.2)

## 2023-06-15 LAB — BRAIN NATRIURETIC PEPTIDE: B Natriuretic Peptide: 674.5 pg/mL — ABNORMAL HIGH (ref 0.0–100.0)

## 2023-06-15 MED ORDER — POTASSIUM CHLORIDE CRYS ER 20 MEQ PO TBCR: EXTENDED_RELEASE_TABLET | ORAL | 11 refills | Status: DC

## 2023-06-15 NOTE — Progress Notes (Addendum)
Advanced Heart Failure Clinic Note   PCP: Farley Ly, PA-C ([facility]) EP: Dr Graciela Husbands Medtronic ICD HF Cardiologist: Dr. Gala Romney  HPI: Sydney Shaffer is a 57 y.o.female with chronic systolic HF due to NICM s/p Medtronic ICD, HTN, and SVT s/p 2/15 ablation.  Cath 4/13 and 10/17 normal coronaries.  Lost to follow up until 5/19. Echo 6/19 EF 30-35% mild MR. S/p ICD gen change 12/20.  Reestablished with HF clinic in April 2023. Had been out of some medications.   Echo 4/23: EF 25-30%, LV severely dilated, grade III DD, RV okay, RVSP 49 mmHg, moderate MR, mild to moderate TR   Admitted 7/23 with ADHF after being out of meds. Referred for paramedicine.  Amiodarone increased d/t episodes of VT (monitor only) and runs SVT and AF.   Saw Dr. Graciela Husbands in 1/24 concerned about sinus tach. Also had 2 episodes of NSVT. Carvedilol increased. Ivabradine considered.   I saw her in 3/24 and I was concerned about end-stage HF. CPX ordered. CPX 4/24 showed Severe functional limitation due to a combination of restrictive lung physiology and HF. Suspect her HF is major limiting factor.   FVC 1.67 (52%)      FEV1 1.37 (54%)        FEV1/FVC 82 (103%)        MVV 50 (49%)      BP rest: 88/68 ->  BP peak: 122/68  pVO2: 12.4 (55% predicted peak VO2) VE/VCO2 slope:  31  Peak RER: 1.12  VE/MVV:  79% (VE/FEV1*40 = 72%)   Echo  03/02/23 EF 20-25% RV mild HK   Returns for f/u. Overall doing ok. Can do all ADLs without too much difficulty. Gets SOB with stairs. No CP, edema, orthopnea or PND. BP improved (not as low).   Cardiac Studies:  - Echo (4/23): EF 25-30% - Echo (10/17): EF 25-30%, Grade 1 DD - Echo (6/16): EF 30-35% mild MR - Echo (2/15): EF 20-25%  - Echo (7/13): EF 20-25%   - R/LHC (10/17): normal cors  RA = 3 RV = 23/4 PA = 22/6 (14) PCW = 8 Ao = 123/69 (91) LV = 106/6 Fick cardiac output/index = 7.4/3.5 PVR = < 1.0 WU SVR = 958  FA sat = 88% PA sat = 63%, 62%   - CPX  (5/18)  FVC 1.99 (61%)      FEV1 1.59 (61%)        FEV1/FVC 80 (99%)        MVV 58  (57%)       Resting HR: 81 Peak HR: 121   (72% age predicted max HR) BP rest: 108/68 BP peak: 144/80 Peak VO2: 12.3 (61% predicted peak VO2) VE/VCO2 slope:  25 OUES: 2.13 Peak RER: 0.98 Ventilatory Threshold: 11.2 (55% predicted or measured peak VO2) VE/MVV:  59% PETCO2 at peak:  40 O2pulse:  10   (91% predicted O2pulse)  - CPX (6/15): FVC 1.69 (51%)  FEV1 1.24 (47%)  FEV1/FVC 74%  Peak VO2: 12.6 (57.8% predicted peak VO2) VE/VCO2 slope: 28.6 OUES: 1.60 Peak RER: 1.01 Peak VO2 adjusted to the patient's ideal body weight of 150 lb (67.8 kg) the peak VO2 is 16.5 ml/kg (ibw)/min (62% of the ibw-adjusted predicted).  SH: She is not a smoker. She does not drink alcohol. Lives alone. She has 3 grown children   FH: Mom breast cancer. Father cancer.   ROS: All systems negative except as listed in HPI, PMH and Problem List.  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    Chronic systolic heart failure (HCC)    Dysrhythmia    Bradycardia   Hypertension    Hypertensive heart disease with CHF (HCC)    ICD (implantable cardiac defibrillator) in place 08/13/2012   Menorrhagia    Metrorrhagia 06/04/2015   SVT (supraventricular tachycardia) (HCC) 03/30/2014   VT (ventricular tachycardia) (HCC) 07/22/2014   Current Outpatient Medications  Medication Sig Dispense Refill   amiodarone (PACERONE) 200 MG tablet Take 1/2 tablet (100 mg total) by mouth daily. 15 tablet 6   apixaban (ELIQUIS) 5 MG TABS tablet Take 1 tablet (5 mg total) by mouth 2 (two) times  daily. 60 tablet 6   carvedilol (COREG) 3.125 MG tablet Take 1 tablet (3.125 mg total) by mouth 2 (two) times daily. 60 tablet 3   cyclobenzaprine (FLEXERIL) 10 MG tablet Take 1 tablet (10 mg total) by mouth 3 (three) times daily as needed for muscle spasms. 20 tablet 0   dapagliflozin propanediol (FARXIGA) 10 MG TABS tablet Take 1 tablet (10 mg total) by mouth daily. Needs appt for future refills 30 tablet 6   digoxin (LANOXIN) 0.125 MG tablet Take 1 tablet (0.125 mg total) by mouth daily. 90 tablet 3   doxylamine, Sleep, (UNISOM) 25 MG tablet Take 25 mg by mouth at bedtime as needed.     ferrous sulfate 325 (65 FE) MG tablet Take 325 mg by mouth daily with breakfast.     losartan (COZAAR) 25 MG tablet Take 1/2 tablet (12.5 mg total) by mouth at bedtime. 15 tablet 3   Multiple Vitamin (MULTIVITAMIN WITH MINERALS) TABS Take 1 tablet by mouth daily.     spironolactone (ALDACTONE) 25 MG tablet Take 1 tablet (25 mg total) by mouth daily. Hold if systolic blood pressure less than 100. 45 tablet 3   torsemide (DEMADEX) 20 MG tablet Take 4 tablets (80 mg total) by mouth in the morning AND 2 tablets (40 mg total) every evening. 545 tablet 2   potassium chloride SA (KLOR-CON M) 20 MEQ tablet Take 4 tablets (80 mEq total) by mouth every morning AND 3 tablets (60 mEq total) every evening. 210 tablet 11   No current facility-administered medications for this encounter.   BP 123/75 Comment: MAP 66  Pulse 92   Ht 5\' 8"  (1.727 m)   Wt 81.6 kg (180 lb)   SpO2 98%   BMI 27.37 kg/m   Wt Readings from Last 3 Encounters:  06/15/23 81.6 kg (180 lb)  05/14/23 83.4 kg (183 lb 12.8 oz)  05/07/23 84.6 kg (186 lb 6.4 oz)   Vitals:   06/15/23 1118  BP: 123/75  Pulse: 92  SpO2: 98%  Weight: 81.6 kg (180 lb)  Height: 5\' 8"  (1.727 m)    PHYSICAL EXAM: General:  Well appearing. No resp difficulty HEENT: normal Neck: supple. no JVD. Carotids 2+ bilat; no bruits. No lymphadenopathy or thryomegaly  appreciated. Cor: PMI nondisplaced. Regular rate & rhythm. No rubs, gallops or murmurs. Lungs: clear Abdomen: soft, nontender, nondistended. No hepatosplenomegaly. No bruits or masses. Good bowel sounds. Extremities:  no cyanosis, clubbing, rash, edema Neuro: alert & orientedx3, cranial nerves grossly intact. moves all 4 extremities w/o difficulty. Affect pleasant   Device interrogation: No VT/AF Optivol  ok. Personally reviewed   ASSESSMENT & PLAN:  1. Chronic Systolic Heart Failure:  - Normal cors by cath 2013. Nonischemic cardiomyopathy s/p Medtronic ICD.  - Echo (6/16): EF 30-35%. - Echo (10/17): EF 25-30%.   - Echo (6/19): EF 30-35%. - Echo (4/23): EF 25-30% - Echo /25/24 EF 20-25% RV mild HK Personally reviewed -> echo concerning for LVNC but unable to get cMRI to confirm with ICD in place.  - ICD generator change 11/14/19 - Progressive NYHA III-IIIB Volume status ok.  - CPX 4/24 FVC 1.67 (52%) FEV1 1.37 (54%) FEV1/FVC 82 (103%) BP rest: 88/68 ->  BP peak: 122/68 pVO2: 12.4 (55% predicted peak VO2) VE/VCO2 31 pRER: 1.12 VE/MVV:  79% - Continue losartan 12.5 qhs - Continue spiro 25 mg daily - Decrease Coreg to 3.125 mg bid. - Continue Farxiga 10 mg daily - Continue digoxin 0.125 daily  - Continue Eliquis for possible LVNC  - Blood Type O - She is interested in advanced therapies (particularly transplant) but still may be a bit early. We discussed next steps in detail. Will plan [facility] cath and referral to [facility]. If [facility] ok will continue close surveillance.                                                                                            -  2. SVT and VT  - s/p atrial tachycardia ablation in 01/2014.   - No VT on ICD interrogation today - Continue amio 100 daily - She follows with Dr. Graciela Husbands.   3. SDOH - Compliance has improved greatly  with Paramedicine program. Appreciate assistance.  Total time spent 45 minutes. Over half that time spent discussing above.     Arvilla Meres, MD 06/15/2023

## 2023-06-15 NOTE — Progress Notes (Signed)
CSW met with patient to discuss potential VAD and financial needs. She states she lives alone and limited other financial support. Patient reports she was working for a [employer] and is now unable to work due to health issues. Patient states that she has BC/BS which has been terminated due to lack of employment although she does medicaid. Patient states she has not applied for disability yet and currently has no income. Patient reports her rent is $700 monthly and unclear how she will meet her expenses.  CSW assisted with [facility] referral to get the disability application going as well as a food stamp referral to [employer]. Patient verbalizes understanding and will be available as needed. Lasandra Beech, LCSW, CCSW-MCS [phone]

## 2023-06-15 NOTE — H&P (View-Only) (Signed)
Advanced Heart Failure Clinic Note   PCP: Farley Ly, PA-C ([facility]) EP: Dr Graciela Husbands Medtronic ICD HF Cardiologist: Dr. Gala Romney  HPI: Sydney Shaffer is a 57 y.o.female with chronic systolic HF due to NICM s/p Medtronic ICD, HTN, and SVT s/p 2/15 ablation.  Cath 4/13 and 10/17 normal coronaries.  Lost to follow up until 5/19. Echo 6/19 EF 30-35% mild MR. S/p ICD gen change 12/20.  Reestablished with HF clinic in April 2023. Had been out of some medications.   Echo 4/23: EF 25-30%, LV severely dilated, grade III DD, RV okay, RVSP 49 mmHg, moderate MR, mild to moderate TR   Admitted 7/23 with ADHF after being out of meds. Referred for paramedicine.  Amiodarone increased d/t episodes of VT (monitor only) and runs SVT and AF.   Saw Dr. Graciela Husbands in 1/24 concerned about sinus tach. Also had 2 episodes of NSVT. Carvedilol increased. Ivabradine considered.   I saw her in 3/24 and I was concerned about end-stage HF. CPX ordered. CPX 4/24 showed Severe functional limitation due to a combination of restrictive lung physiology and HF. Suspect her HF is major limiting factor.   FVC 1.67 (52%)      FEV1 1.37 (54%)        FEV1/FVC 82 (103%)        MVV 50 (49%)      BP rest: 88/68 ->  BP peak: 122/68  pVO2: 12.4 (55% predicted peak VO2) VE/VCO2 slope:  31  Peak RER: 1.12  VE/MVV:  79% (VE/FEV1*40 = 72%)   Echo  03/02/23 EF 20-25% RV mild HK   Returns for f/u. Overall doing ok. Can do all ADLs without too much difficulty. Gets SOB with stairs. No CP, edema, orthopnea or PND. BP improved (not as low).   Cardiac Studies:  - Echo (4/23): EF 25-30% - Echo (10/17): EF 25-30%, Grade 1 DD - Echo (6/16): EF 30-35% mild MR - Echo (2/15): EF 20-25%  - Echo (7/13): EF 20-25%   - R/LHC (10/17): normal cors  RA = 3 RV = 23/4 PA = 22/6 (14) PCW = 8 Ao = 123/69 (91) LV = 106/6 Fick cardiac output/index = 7.4/3.5 PVR = < 1.0 WU SVR = 958  FA sat = 88% PA sat = 63%, 62%   - CPX  (5/18)  FVC 1.99 (61%)      FEV1 1.59 (61%)        FEV1/FVC 80 (99%)        MVV 58  (57%)       Resting HR: 81 Peak HR: 121   (72% age predicted max HR) BP rest: 108/68 BP peak: 144/80 Peak VO2: 12.3 (61% predicted peak VO2) VE/VCO2 slope:  25 OUES: 2.13 Peak RER: 0.98 Ventilatory Threshold: 11.2 (55% predicted or measured peak VO2) VE/MVV:  59% PETCO2 at peak:  40 O2pulse:  10   (91% predicted O2pulse)  - CPX (6/15): FVC 1.69 (51%)  FEV1 1.24 (47%)  FEV1/FVC 74%  Peak VO2: 12.6 (57.8% predicted peak VO2) VE/VCO2 slope: 28.6 OUES: 1.60 Peak RER: 1.01 Peak VO2 adjusted to the patient's ideal body weight of 150 lb (67.8 kg) the peak VO2 is 16.5 ml/kg (ibw)/min (62% of the ibw-adjusted predicted).  SH: She is not a smoker. She does not drink alcohol. Lives alone. She has 3 grown children   FH: Mom breast cancer. Father cancer.   ROS: All systems negative except as listed in HPI, PMH and Problem List.  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    Chronic systolic heart failure (HCC)    Dysrhythmia    Bradycardia   Hypertension    Hypertensive heart disease with CHF (HCC)    ICD (implantable cardiac defibrillator) in place 08/13/2012   Menorrhagia    Metrorrhagia 06/04/2015   SVT (supraventricular tachycardia) (HCC) 03/30/2014   VT (ventricular tachycardia) (HCC) 07/22/2014   Current Outpatient Medications  Medication Sig Dispense Refill   amiodarone (PACERONE) 200 MG tablet Take 1/2 tablet (100 mg total) by mouth daily. 15 tablet 6   apixaban (ELIQUIS) 5 MG TABS tablet Take 1 tablet (5 mg total) by mouth 2 (two) times  daily. 60 tablet 6   carvedilol (COREG) 3.125 MG tablet Take 1 tablet (3.125 mg total) by mouth 2 (two) times daily. 60 tablet 3   cyclobenzaprine (FLEXERIL) 10 MG tablet Take 1 tablet (10 mg total) by mouth 3 (three) times daily as needed for muscle spasms. 20 tablet 0   dapagliflozin propanediol (FARXIGA) 10 MG TABS tablet Take 1 tablet (10 mg total) by mouth daily. Needs appt for future refills 30 tablet 6   digoxin (LANOXIN) 0.125 MG tablet Take 1 tablet (0.125 mg total) by mouth daily. 90 tablet 3   doxylamine, Sleep, (UNISOM) 25 MG tablet Take 25 mg by mouth at bedtime as needed.     ferrous sulfate 325 (65 FE) MG tablet Take 325 mg by mouth daily with breakfast.     losartan (COZAAR) 25 MG tablet Take 1/2 tablet (12.5 mg total) by mouth at bedtime. 15 tablet 3   Multiple Vitamin (MULTIVITAMIN WITH MINERALS) TABS Take 1 tablet by mouth daily.     spironolactone (ALDACTONE) 25 MG tablet Take 1 tablet (25 mg total) by mouth daily. Hold if systolic blood pressure less than 100. 45 tablet 3   torsemide (DEMADEX) 20 MG tablet Take 4 tablets (80 mg total) by mouth in the morning AND 2 tablets (40 mg total) every evening. 545 tablet 2   potassium chloride SA (KLOR-CON M) 20 MEQ tablet Take 4 tablets (80 mEq total) by mouth every morning AND 3 tablets (60 mEq total) every evening. 210 tablet 11   No current facility-administered medications for this encounter.   BP 123/75 Comment: MAP 66  Pulse 92   Ht 5\' 8"  (1.727 m)   Wt 81.6 kg (180 lb)   SpO2 98%   BMI 27.37 kg/m   Wt Readings from Last 3 Encounters:  06/15/23 81.6 kg (180 lb)  05/14/23 83.4 kg (183 lb 12.8 oz)  05/07/23 84.6 kg (186 lb 6.4 oz)   Vitals:   06/15/23 1118  BP: 123/75  Pulse: 92  SpO2: 98%  Weight: 81.6 kg (180 lb)  Height: 5\' 8"  (1.727 m)    PHYSICAL EXAM: General:  Well appearing. No resp difficulty HEENT: normal Neck: supple. no JVD. Carotids 2+ bilat; no bruits. No lymphadenopathy or thryomegaly  appreciated. Cor: PMI nondisplaced. Regular rate & rhythm. No rubs, gallops or murmurs. Lungs: clear Abdomen: soft, nontender, nondistended. No hepatosplenomegaly. No bruits or masses. Good bowel sounds. Extremities:  no cyanosis, clubbing, rash, edema Neuro: alert & orientedx3, cranial nerves grossly intact. moves all 4 extremities w/o difficulty. Affect pleasant   Device interrogation: No VT/AF Optivol  ok. Personally reviewed   ASSESSMENT & PLAN:  1. Chronic Systolic Heart Failure:  - Normal cors by cath 2013. Nonischemic cardiomyopathy s/p Medtronic ICD.  - Echo (6/16): EF 30-35%. - Echo (10/17): EF 25-30%.   - Echo (6/19): EF 30-35%. - Echo (4/23): EF 25-30% - Echo /25/24 EF 20-25% RV mild HK Personally reviewed -> echo concerning for LVNC but unable to get cMRI to confirm with ICD in place.  - ICD generator change 11/14/19 - Progressive NYHA III-IIIB Volume status ok.  - CPX 4/24 FVC 1.67 (52%) FEV1 1.37 (54%) FEV1/FVC 82 (103%) BP rest: 88/68 ->  BP peak: 122/68 pVO2: 12.4 (55% predicted peak VO2) VE/VCO2 31 pRER: 1.12 VE/MVV:  79% - Continue losartan 12.5 qhs - Continue spiro 25 mg daily - Decrease Coreg to 3.125 mg bid. - Continue Farxiga 10 mg daily - Continue digoxin 0.125 daily  - Continue Eliquis for possible LVNC  - Blood Type O - She is interested in advanced therapies (particularly transplant) but still may be a bit early. We discussed next steps in detail. Will plan [facility] cath and referral to [facility]. If [facility] ok will continue close surveillance.                                                                                            -  2. SVT and VT  - s/p atrial tachycardia ablation in 01/2014.   - No VT on ICD interrogation today - Continue amio 100 daily - She follows with Dr. Graciela Husbands.   3. SDOH - Compliance has improved greatly  with Paramedicine program. Appreciate assistance.  Total time spent 45 minutes. Over half that time spent discussing above.     Arvilla Meres, MD 06/15/2023

## 2023-06-15 NOTE — Progress Notes (Unsigned)
Paramedicine Encounter   Patient ID: Sydney Shaffer , female,   DOB: 08-19-1966,57 y.o.,  MRN: [id number]   Met patient in clinic today with provider.  Weight @ clinic-180 B/P-133/96 B/p standing- 125/73 P-98 [id number]  Med changes-  Pt here for LVAD/transplant conversation with team.  The plan will be to try for transplant when the time is ready.   [facility] to be sch for next week or so.  Info will be sent to [facility] for follow up.  Maybe 6-12 months from when this will be needed. Will follow closely for now. Seen in clinic every 2 months but she will need to call clinic if she feels worse.   NYHA lllB  Meds verified and pill box refilled  Refills needed-  Amio--needs in fri and sat am  Potassium-- needs it in fri and sat pm dose--over $50 at pharmacy  I will call [facility] to see if he can fill these and what insurance she is showing. -still showing bc/bs and medicaid   She is going to text me the pic of the letter stating she no longer has insurance and I will email it to [facility] so that can be taken out of the system.    Kerry Hough, EMT-Paramedic [phone] 06/15/2023

## 2023-06-15 NOTE — Progress Notes (Addendum)
Patient presents for VAD education in VAD Clinic today with Dr. Gala Romney. [facility] laboratory scientific officer) with patient during visit. She filled pill box at end of visit.   EKG and Medtronic bedside interrogation performed today per Dr. Gala Romney request.  VAD patient came and met with patient and showed her his VAD exit site and equipment.    Vital Signs:  BP: 123/75 (66) HR:  92 SPO2: 98 %   Weight: 180 lb Last weight: 183 lb Home weights:  lbs BMI today 27    Device: Medtronic dual Therapies: On 200 bpm Last check: today in clinic   Dr. Gala Romney assessed patient and spoke with her about advanced heart failure options including LVAD and heart transplant. Pt reports she is able to perform daily activities with limited difficulties at this time. Dr. Gala Romney will perform left and right heart cath in two weeks, if numbers ar stable he will hold off on advanced heart failure options at this time. Pt agrees to plan.  Pt had multiple questions about medications, insurance, Medicaid, and disability. Lasandra Beech LCLW spoke with patient at length. Charlett Nose (pharmacy) spoke with patient at length. All her questions were answered at this time.   Stressed importance to patient of getting her mammogram, colonoscopy, and dental check up in preparation for advanced heart failure options in future if needed. She verbalized understanding of same.    Patient Instructions:   No change in medications. Labs today - will call you if abnormal. Right and left heart cath scheduled for Wednesday, July 24th at 8:30. Please arrive to [facility] Admissions at 06:30 - see instructions below. Return to see Dr. Gala Romney in VAD Clinic in 2 months. See below. Please call Heart Failure Clinic at [phone] if any questions or concerns prior to your next visit.    You are scheduled for Cardiac Catheterization on Wednesday July 01, 2023 at 08:30 with Dr. Gala Romney.  Please arrive at the ADMITTING OFFICE  INSIDE MAIN ENTRANCE A of [facility] at 06:30 a.m. on the day of your procedure.  1. DIET _X__ Nothing to eat or drink after midnight except your medications with a sip of water.  4. _X_ DO NOT TAKE these medications before your procedure:TORSEMIDE  5. __X_ HOLD YOUR Eliquis - take last dose of Eliquis Monday evening. Do not take Tuesday or Wednesday morning of cath.      _X__ YOU MAY TAKE ALL of your remaining medications with a small amount of water  5. Plan for one night stay - bring personal belongings (i.e. toothpaste, toothbrush, etc.)  6. Bring a current list of your medications and current insurance cards.  7. Must have a responsible person to drive you home.  8. Someone must be with you for the first 24 hours after you arrive home.  9. Please wear clothes that are easy to get on and off and wear slip-on shoes.  * Special note: Every effort is made to have your procedure done on time. Occasionally there are emergencies that present themselves at the hospital that may cause delays. Please be patient if a delay does occur.  If you have any questions after you get home, please call the office at the number listed above.  Hessie Diener  RN VAD Coordinator    Office: [phone] 24/7 Emergency VAD Pager: [phone]

## 2023-06-15 NOTE — Patient Instructions (Addendum)
No change in medications. Labs today - will call you if abnormal. Right and left heart cath scheduled for Wednesday, July 24th at 8:30. Please arrive to [facility] Admissions at 06:30 - see instructions below. Return to see Dr. Gala Romney in VAD Clinic in 2 months. See below. Please call Heart Failure Clinic at [phone] if any questions or concerns prior to your next visit.    You are scheduled for Cardiac Catheterization on Wednesday July 01, 2023 at 08:30 with Dr. Gala Romney.  Please arrive at the ADMITTING OFFICE INSIDE MAIN ENTRANCE A of [facility] at 06:30 a.m. on the day of your procedure.  1. DIET _X__ Nothing to eat or drink after midnight except your medications with a sip of water.  4. _X_ DO NOT TAKE these medications before your procedure:TORSEMIDE  5. __X_ HOLD YOUR Eliquis - take last dose of Eliquis Monday evening. Do not take Tuesday or Wednesday morning of cath.      _X__ YOU MAY TAKE ALL of your remaining medications with a small amount of water  5. Plan for one night stay - bring personal belongings (i.e. toothpaste, toothbrush, etc.)  6. Bring a current list of your medications and current insurance cards.  7. Must have a responsible person to drive you home.  8. Someone must be with you for the first 24 hours after you arrive home.  9. Please wear clothes that are easy to get on and off and wear slip-on shoes.  * Special note: Every effort is made to have your procedure done on time. Occasionally there are emergencies that present themselves at the hospital that may cause delays. Please be patient if a delay does occur.  If you have any questions after you get home, please call the office at the number listed above.   Marland Kitchen

## 2023-06-18 ENCOUNTER — Telehealth (HOSPITAL_COMMUNITY): Payer: Self-pay | Admitting: Cardiology

## 2023-06-18 ENCOUNTER — Telehealth (HOSPITAL_COMMUNITY): Payer: Self-pay | Admitting: Licensed Clinical Social Worker

## 2023-06-18 DIAGNOSIS — I420 Dilated cardiomyopathy: Secondary | ICD-10-CM

## 2023-06-18 MED ORDER — POTASSIUM CHLORIDE CRYS ER 20 MEQ PO TBCR
EXTENDED_RELEASE_TABLET | ORAL | 11 refills | Status: DC
Start: 2023-06-18 — End: 2024-08-03

## 2023-06-18 NOTE — Telephone Encounter (Signed)
-----   Message from Arvilla Meres sent at 06/16/2023  7:28 PM EDT ----- Please add kcl 20 extra per day. Repeat BMET 1 week.

## 2023-06-18 NOTE — Telephone Encounter (Signed)
Patient called.  Patient aware.  

## 2023-06-18 NOTE — Telephone Encounter (Signed)
CSW contacted Medicaid TPL unit [id number] *3 to follow up on patient's request regarding her BC/BS insurance. Patient states that she has had some difficulties with Medicaid not paying bills as it appears that the BC/BS is active in the system yet she has a letter confirming the insurance was terminated on 03-08-23. CSW spoke with caseworker who reports there is no active insurance other than the Medicaid in the system. They apparently never had any record of the patient having BC/BS. CSW was instructed to have patient contact [employer] to follow up and inform of terminated BC/BS.   CSW contacted patient and message left for return call. Lasandra Beech, LCSW, CCSW-MCS [phone]

## 2023-06-23 ENCOUNTER — Ambulatory Visit
Admission: RE | Admit: 2023-06-23 | Discharge: 2023-06-23 | Disposition: A | Discharge status: Home / Self Care | Payer: Medicaid Other | Source: Ambulatory Visit | Attending: Nurse Practitioner | Admitting: Nurse Practitioner

## 2023-06-23 DIAGNOSIS — Z1231 Encounter for screening mammogram for malignant neoplasm of breast: Secondary | ICD-10-CM

## 2023-06-25 ENCOUNTER — Other Ambulatory Visit (HOSPITAL_COMMUNITY): Payer: Self-pay

## 2023-06-29 NOTE — Progress Notes (Signed)
Paramedicine Encounter    Patient ID: Sydney Shaffer, female    DOB: January 26, 1966, 57 y.o.   MRN: [id number]   Complaints-no new complaints  Edema-none   Compliance with meds-she reports so   Pill box filled-yes--last I filled it was last Monday, today is Thursday-she preferred thurs this week  If so, by whom-paramedic   Refills needed-amio  Pt reports doing same.  She denies increased issues or concerns.  She is still having issues with her insurance company. I was told by Annice Pih that she had to take the denial letter to caseworker at social services to handle this so I told her that as well.  She did call the number on back of [employer] card and told them it was still showing up also,  she was told over the phone that it had already been removed but pharmacy is still showing it in the system when trying to refill meds.  She has appoint on Monday to apply for disability. She is concerned about income until she is approved, we asked jenna about limits and was told working approx 14hrs while applying is acceptable but once approved she can work up to [employer].  She has heart cath next week. Pill box set up for the hold on eliquis prior to procedure.    Will f/u next week.   BP (!) 114/90   Pulse 88   Resp 18   Wt 171 lb (77.6 kg)   SpO2 98%   BMI 26.00 kg/m  Weight yesterday-? 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   Anemia 03/29/2012    Current Outpatient Medications:    amiodarone (PACERONE) 200 MG  tablet, Take 1/2 tablet (100 mg total) by mouth daily., Disp: 15 tablet, Rfl: 6   apixaban (ELIQUIS) 5 MG TABS tablet, Take 1 tablet (5 mg total) by mouth 2 (two) times daily., Disp: 60 tablet, Rfl: 6   carvedilol (COREG) 3.125 MG tablet, Take 1 tablet (3.125 mg total) by mouth 2 (two) times daily., Disp: 60 tablet, Rfl: 3   cyclobenzaprine (FLEXERIL) 10 MG tablet, Take 1 tablet (10 mg total) by mouth 3 (three) times daily as needed for muscle spasms., Disp: 20 tablet, Rfl: 0   dapagliflozin propanediol (FARXIGA) 10 MG TABS tablet, Take 1 tablet (10 mg total) by mouth daily. Needs appt for future refills, Disp: 30 tablet, Rfl: 6   digoxin (LANOXIN) 0.125 MG tablet, Take 1 tablet (0.125 mg total) by mouth daily., Disp: 90 tablet, Rfl: 3   doxylamine, Sleep, (UNISOM) 25 MG tablet, Take 25 mg by mouth at bedtime as needed., Disp: , Rfl:    ferrous sulfate 325 (65 FE) MG tablet, Take 325 mg by mouth daily with breakfast., Disp: , Rfl:    losartan (COZAAR) 25 MG tablet, Take 1/2 tablet (12.5 mg total) by mouth at bedtime., Disp: 15 tablet, Rfl: 3   Multiple Vitamin (MULTIVITAMIN WITH MINERALS) TABS, Take 1 tablet by mouth daily., Disp: , Rfl:    potassium chloride SA (KLOR-CON M) 20 MEQ tablet, Take 4 tablets (80 mEq total) by  mouth every morning AND 4 tablets (80 mEq total) every evening., Disp: 240 tablet, Rfl: 11   spironolactone (ALDACTONE) 25 MG tablet, Take 1 tablet (25 mg total) by mouth daily. Hold if systolic blood pressure less than 100., Disp: 45 tablet, Rfl: 3   torsemide (DEMADEX) 20 MG tablet, Take 4 tablets (80 mg total) by mouth in the morning AND 2 tablets (40 mg total) every evening., Disp: 545 tablet, Rfl: 2 No Known Allergies    Social History   Socioeconomic History   Marital status: Single    Spouse name: Not on file   Number of children: 3   Years of education: Not on file   Highest education level: Not on file  Occupational History   Occupation: receptionist    Employer:  [employer]   Occupation: office cleaning  Tobacco Use   Smoking status: Never   Smokeless tobacco: Never  Vaping Use   Vaping status: Never Used  Substance and Sexual Activity   Alcohol use: Yes    Alcohol/week: 5.0 standard drinks of alcohol    Types: 5 Glasses of wine per week    Comment: daily   Drug use: Never   Sexual activity: Yes    Birth control/protection: Condom  Other Topics Concern   Not on file  Social History Narrative   Not on file   Social Determinants of Health   Financial Resource Strain: High Risk (03/03/2023)   Overall Financial Resource Strain (CARDIA)    Difficulty of Paying Living Expenses: Very hard  Food Insecurity: No Food Insecurity (07/02/2022)   Hunger Vital Sign    Worried About Running Out of Food in the Last Year: Never true    Ran Out of Food in the Last Year: Never true  Transportation Needs: No Transportation Needs (07/02/2022)   PRAPARE - Administrator, Civil Service (Medical): No    Lack of Transportation (Non-Medical): No  Physical Activity: Not on file  Stress: Not on file  Social Connections: Not on file  Intimate Partner Violence: Not on file    Physical Exam      Future Appointments  Date Time Provider Department Center  07/03/2023  3:45 PM [facility] None  08/17/2023  1:00 PM Bensimhon, Bevelyn Buckles, MD [facility] None  09/14/2023  3:05 PM [facility] DEVICE REMOTES [facility]  12/14/2023  3:05 PM [facility] DEVICE REMOTES [facility]  03/14/2024  3:05 PM [facility] DEVICE REMOTES [facility]       Kerry Hough, Paramedic [phone] [facility] Paramedic  06/25/2023

## 2023-07-01 ENCOUNTER — Encounter (HOSPITAL_COMMUNITY)
Admission: RE | Disposition: A | Discharge status: Home / Self Care | Payer: Self-pay | Source: Home / Self Care | Attending: Internal Medicine

## 2023-07-01 ENCOUNTER — Ambulatory Visit (HOSPITAL_COMMUNITY)
Admission: RE | Admit: 2023-07-01 | Discharge: 2023-07-01 | Disposition: A | Discharge status: Home / Self Care | Payer: Medicaid Other | Attending: Internal Medicine | Admitting: Internal Medicine

## 2023-07-01 DIAGNOSIS — I5022 Chronic systolic (congestive) heart failure: Secondary | ICD-10-CM | POA: Insufficient documentation

## 2023-07-01 DIAGNOSIS — I420 Dilated cardiomyopathy: Secondary | ICD-10-CM

## 2023-07-01 DIAGNOSIS — Z79899 Other long term (current) drug therapy: Secondary | ICD-10-CM | POA: Insufficient documentation

## 2023-07-01 DIAGNOSIS — I471 Supraventricular tachycardia, unspecified: Secondary | ICD-10-CM | POA: Insufficient documentation

## 2023-07-01 DIAGNOSIS — I428 Other cardiomyopathies: Secondary | ICD-10-CM | POA: Diagnosis not present

## 2023-07-01 DIAGNOSIS — Z5986 Financial insecurity: Secondary | ICD-10-CM | POA: Diagnosis not present

## 2023-07-01 DIAGNOSIS — Z95810 Presence of automatic (implantable) cardiac defibrillator: Secondary | ICD-10-CM | POA: Insufficient documentation

## 2023-07-01 DIAGNOSIS — Z7901 Long term (current) use of anticoagulants: Secondary | ICD-10-CM | POA: Diagnosis not present

## 2023-07-01 DIAGNOSIS — I110 Hypertensive heart disease with heart failure: Secondary | ICD-10-CM | POA: Insufficient documentation

## 2023-07-01 HISTORY — PX: RIGHT/LEFT HEART CATH AND CORONARY ANGIOGRAPHY: CATH118266

## 2023-07-01 LAB — POCT I-STAT EG7
Acid-Base Excess: 4 mmol/L — ABNORMAL HIGH (ref 0.0–2.0)
Acid-Base Excess: 1 mmol/L (ref 0.0–2.0)
Bicarbonate: 30.2 mmol/L — ABNORMAL HIGH (ref 20.0–28.0)
Bicarbonate: 26.5 mmol/L (ref 20.0–28.0)
Calcium, Ion: 1.19 mmol/L (ref 1.15–1.40)
Calcium, Ion: 1 mmol/L — ABNORMAL LOW (ref 1.15–1.40)
HCT: 42 % (ref 36.0–46.0)
HCT: 37 % (ref 36.0–46.0)
Hemoglobin: 14.3 g/dL (ref 12.0–15.0)
Hemoglobin: 12.6 g/dL (ref 12.0–15.0)
O2 Saturation: 61 %
O2 Saturation: 65 %
Potassium: 3.7 mmol/L (ref 3.5–5.1)
Potassium: 3.1 mmol/L — ABNORMAL LOW (ref 3.5–5.1)
Sodium: 141 mmol/L (ref 135–145)
Sodium: 146 mmol/L — ABNORMAL HIGH (ref 135–145)
TCO2: 32 mmol/L (ref 22–32)
TCO2: 28 mmol/L (ref 22–32)
pCO2, Ven: 50.6 mmHg (ref 44–60)
pCO2, Ven: 45.2 mmHg (ref 44–60)
pH, Ven: 7.384 (ref 7.25–7.43)
pH, Ven: 7.377 (ref 7.25–7.43)
pO2, Ven: 33 mmHg (ref 32–45)
pO2, Ven: 35 mmHg (ref 32–45)

## 2023-07-01 LAB — POCT I-STAT 7, (LYTES, BLD GAS, ICA,H+H)
Acid-Base Excess: 1 mmol/L (ref 0.0–2.0)
Bicarbonate: 25.4 mmol/L (ref 20.0–28.0)
Calcium, Ion: 1.01 mmol/L — ABNORMAL LOW (ref 1.15–1.40)
HCT: 38 % (ref 36.0–46.0)
Hemoglobin: 12.9 g/dL (ref 12.0–15.0)
O2 Saturation: 91 %
Potassium: 3.3 mmol/L — ABNORMAL LOW (ref 3.5–5.1)
Sodium: 144 mmol/L (ref 135–145)
TCO2: 27 mmol/L (ref 22–32)
pCO2 arterial: 40.7 mmHg (ref 32–48)
pH, Arterial: 7.403 (ref 7.35–7.45)
pO2, Arterial: 60 mmHg — ABNORMAL LOW (ref 83–108)

## 2023-07-01 LAB — BASIC METABOLIC PANEL
Anion gap: 13 (ref 5–15)
BUN: 20 mg/dL (ref 6–20)
CO2: 26 mmol/L (ref 22–32)
Calcium: 9.3 mg/dL (ref 8.9–10.3)
Chloride: 99 mmol/L (ref 98–111)
Creatinine, Ser: 1.01 mg/dL — ABNORMAL HIGH (ref 0.44–1.00)
GFR, Estimated: >60 mL/min (ref >60)
Glucose, Bld: 117 mg/dL — ABNORMAL HIGH (ref 70–99)
Potassium: 3.3 mmol/L — ABNORMAL LOW (ref 3.5–5.1)
Sodium: 138 mmol/L (ref 135–145)

## 2023-07-01 SURGERY — RIGHT/LEFT HEART CATH AND CORONARY ANGIOGRAPHY
Anesthesia: LOCAL

## 2023-07-01 MED ORDER — MIDAZOLAM HCL 2 MG/2ML IJ SOLN
INTRAMUSCULAR | Status: DC | PRN
  Administered 2023-07-01: 1 mg via INTRAVENOUS

## 2023-07-01 MED ORDER — ASPIRIN 81 MG PO CHEW
81.0000 mg | CHEWABLE_TABLET | ORAL | Status: AC
  Administered 2023-07-01: 81 mg via ORAL
  Filled 2023-07-01: qty 1

## 2023-07-01 MED ORDER — SODIUM CHLORIDE 0.9% FLUSH: 3.0000 mL | INTRAVENOUS | Status: DC | PRN

## 2023-07-01 MED ORDER — FENTANYL CITRATE (PF) 100 MCG/2ML IJ SOLN
INTRAMUSCULAR | Status: DC | PRN
  Administered 2023-07-01: 25 ug via INTRAVENOUS

## 2023-07-01 MED ORDER — SODIUM CHLORIDE 0.9% FLUSH: 3.0000 mL | Freq: Two times a day (BID) | INTRAVENOUS | Status: DC

## 2023-07-01 MED ORDER — FENTANYL CITRATE (PF) 100 MCG/2ML IJ SOLN
INTRAMUSCULAR | Status: AC
  Filled 2023-07-01: qty 2

## 2023-07-01 MED ORDER — VERAPAMIL HCL 2.5 MG/ML IV SOLN
INTRAVENOUS | Status: AC
  Filled 2023-07-01: qty 2

## 2023-07-01 MED ORDER — SODIUM CHLORIDE 0.9 % IV SOLN: 250.0000 mL | INTRAVENOUS | Status: DC | PRN

## 2023-07-01 MED ORDER — MIDAZOLAM HCL 2 MG/2ML IJ SOLN
INTRAMUSCULAR | Status: AC
  Filled 2023-07-01: qty 2

## 2023-07-01 MED ORDER — ACETAMINOPHEN 325 MG PO TABS: 650.0000 mg | ORAL_TABLET | ORAL | Status: DC | PRN

## 2023-07-01 MED ORDER — LIDOCAINE HCL (PF) 1 % IJ SOLN
INTRAMUSCULAR | Status: AC
  Filled 2023-07-01: qty 30

## 2023-07-01 MED ORDER — LIDOCAINE HCL (PF) 1 % IJ SOLN
INTRAMUSCULAR | Status: DC | PRN
  Administered 2023-07-01: 5 mL
  Administered 2023-07-01: 8 mL

## 2023-07-01 MED ORDER — ONDANSETRON HCL 4 MG/2ML IJ SOLN: 4.0000 mg | Freq: Four times a day (QID) | INTRAMUSCULAR | Status: DC | PRN

## 2023-07-01 MED ORDER — LABETALOL HCL 5 MG/ML IV SOLN: 10.0000 mg | INTRAVENOUS | Status: DC | PRN

## 2023-07-01 MED ORDER — HEPARIN (PORCINE) IN NACL 1000-0.9 UT/500ML-% IV SOLN
INTRAVENOUS | Status: DC | PRN
  Administered 2023-07-01 (×2): 500 mL

## 2023-07-01 MED ORDER — VERAPAMIL HCL 2.5 MG/ML IV SOLN
INTRAVENOUS | Status: DC | PRN
  Administered 2023-07-01: 10 mL via INTRA_ARTERIAL

## 2023-07-01 MED ORDER — HEPARIN SODIUM (PORCINE) 1000 UNIT/ML IJ SOLN
INTRAMUSCULAR | Status: DC | PRN
  Administered 2023-07-01: 3500 [IU] via INTRAVENOUS

## 2023-07-01 MED ORDER — IOHEXOL 350 MG/ML SOLN
INTRAVENOUS | Status: DC | PRN
  Administered 2023-07-01: 30 mL via INTRA_ARTERIAL

## 2023-07-01 MED ORDER — SODIUM CHLORIDE 0.9 % IV SOLN: INTRAVENOUS | Status: DC

## 2023-07-01 MED ORDER — HYDRALAZINE HCL 20 MG/ML IJ SOLN: 10.0000 mg | INTRAMUSCULAR | Status: DC | PRN

## 2023-07-01 MED ORDER — POTASSIUM CHLORIDE CRYS ER 20 MEQ PO TBCR: 60.0000 meq | EXTENDED_RELEASE_TABLET | Freq: Once | ORAL | Status: AC

## 2023-07-01 MED ORDER — POTASSIUM CHLORIDE CRYS ER 20 MEQ PO TBCR
EXTENDED_RELEASE_TABLET | ORAL | Status: AC
  Administered 2023-07-01: 60 meq via ORAL
  Filled 2023-07-01: qty 3

## 2023-07-01 MED ORDER — HEPARIN SODIUM (PORCINE) 1000 UNIT/ML IJ SOLN
INTRAMUSCULAR | Status: AC
  Filled 2023-07-01: qty 10

## 2023-07-01 SURGICAL SUPPLY — 16 items
CATH 5FR JL3.5 JR4 ANG PIG MP (CATHETERS) IMPLANT
CATH BALLN WEDGE 5F 110CM (CATHETERS) IMPLANT
CATH SWAN GANZ 7F STRAIGHT (CATHETERS) IMPLANT
DEVICE RAD COMP TR BAND LRG (VASCULAR PRODUCTS) IMPLANT
GLIDESHEATH SLEND SS 6F .021 (SHEATH) IMPLANT
GLIDESHEATH SLENDER 7FR .021G (SHEATH) IMPLANT
GUIDEWIRE .025 260CM (WIRE) IMPLANT
GUIDEWIRE INQWIRE 1.5J.035X260 (WIRE) IMPLANT
INQWIRE 1.5J .035X260CM (WIRE) ×1
KIT MICROPUNCTURE NIT STIFF (SHEATH) IMPLANT
KIT SINGLE USE MANIFOLD (KITS) IMPLANT
KIT SYRINGE INJ CVI SPIKEX1 (MISCELLANEOUS) IMPLANT
PACK CARDIAC CATHETERIZATION (CUSTOM PROCEDURE TRAY) ×1 IMPLANT
SET ATX-X65L (MISCELLANEOUS) IMPLANT
SHEATH GLIDE SLENDER 4/5FR (SHEATH) IMPLANT
SHEATH PROBE COVER 6X72 (BAG) IMPLANT

## 2023-07-01 NOTE — Interval H&P Note (Signed)
History and Physical Interval Note:  07/01/2023 8:27 AM  Sydney Shaffer  has presented today for surgery, with the diagnosis of heart failure.  The various methods of treatment have been discussed with the patient and family. After consideration of risks, benefits and other options for treatment, the patient has consented to  Procedure(s): RIGHT/LEFT HEART CATH AND CORONARY ANGIOGRAPHY ([name]) as a surgical intervention.  The patient's history has been reviewed, patient examined, no change in status, stable for surgery.  I have reviewed the patient's chart and labs.  Questions were answered to the patient's satisfaction.     Zuley Lutter

## 2023-07-01 NOTE — Progress Notes (Signed)
Remote ICD transmission.   

## 2023-07-02 ENCOUNTER — Encounter (HOSPITAL_COMMUNITY): Payer: Self-pay | Admitting: Internal Medicine

## 2023-07-02 ENCOUNTER — Telehealth (HOSPITAL_COMMUNITY): Payer: Self-pay

## 2023-07-02 NOTE — Telephone Encounter (Signed)
Pt reached out to cancel our appointment and to resch for next week.  I asked if everything was ok and she said yes.   Kerry Hough, EMT-Paramedic  [phone] 07/02/2023

## 2023-07-03 ENCOUNTER — Other Ambulatory Visit (HOSPITAL_COMMUNITY): Payer: BC Managed Care – PPO

## 2023-07-09 ENCOUNTER — Telehealth (HOSPITAL_COMMUNITY): Payer: Self-pay

## 2023-07-09 NOTE — Telephone Encounter (Signed)
Reached out to pt to confirm our appoint today, she replied that she was at dentist getting dentures fitted and they are running behind and she isnt sure what time she would be done and would like to resch for Monday.  So we agreed to Monday around 11.   Kerry Hough, EMT-Paramedic  [phone] 07/09/2023

## 2023-07-13 ENCOUNTER — Telehealth (HOSPITAL_COMMUNITY): Payer: Self-pay

## 2023-07-13 NOTE — Telephone Encounter (Signed)
Reached out to pt to check to see if today at 11 is still ok, she is not able to meet today.  Resch for Wednesday around 1030.    Kerry Hough, EMT-Paramedic  [phone] 07/13/2023

## 2023-07-15 ENCOUNTER — Other Ambulatory Visit (HOSPITAL_COMMUNITY): Payer: Self-pay

## 2023-07-15 NOTE — Progress Notes (Signed)
Paramedicine Encounter    Patient ID: Sydney Shaffer, female    DOB: Apr 12, 1966, 57 y.o.   MRN: [id number]   Complaints-same   Edema-none    Compliance with meds-reports so   Pill box filled-yes  If so, by whom-paramedic   Refills needed-eliquis, torsemide     Pt reports she is doing ok. She has cancelled on me the past few appointments. From what she remembers from the [facility] is that she needs to make a decision regarding the transplant or LVAD sooner rather than later.  I remember last visit she was told the transplant would be better option for her.  She pretty much feels the same as usual.  She did apply for disability. She reports she has not tried the food stamps again b/c they told her last time she would not be eligible due to recent income but she will try again soon once more time passes by.   She has to push herself to do things and work the little bit of hours that she does.  Same symptoms as before.   Her insurance is still showing the bc/bs and medicaid. The bc/bs is covering her meds with co-pay. She is going to take documents to local social services office and try to get them to fix this.  I told her to call me while she was there if they needed to speak to me regarding any issues. She plans to go early next week due to the severe weather the next couple days.   Meds verified and pill box refilled. She has been w/o her amio for past couple wks-pharmacy filled it-she never p/u so it was put back so when she went to get it there was nothing ready and she didn't know which one she needed to ask them to refill- she cancelled on me last week and I was not made aware of this issue until this morning.   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   Anemia 03/29/2012    Current Outpatient Medications:    acetaminophen (TYLENOL) 500 MG tablet, Take 1,000 mg by mouth every 6 (six) hours as needed for moderate pain., Disp: , Rfl:    amiodarone (PACERONE) 200 MG tablet, Take 1/2 tablet (100 mg total) by mouth daily., Disp: 15 tablet, Rfl: 6   apixaban (ELIQUIS) 5 MG TABS tablet, Take 1 tablet (5 mg total) by mouth 2 (two) times daily., Disp: 60 tablet, Rfl: 6   carvedilol (COREG) 3.125 MG tablet, Take 1 tablet (3.125 mg total) by mouth 2 (two) times daily., Disp: 60 tablet, Rfl: 3   cyclobenzaprine (FLEXERIL) 10 MG tablet, Take 1 tablet (10 mg total) by mouth 3 (three) times daily as needed for muscle spasms., Disp: 20 tablet, Rfl: 0   dapagliflozin propanediol (FARXIGA) 10 MG TABS tablet, Take 1 tablet (10 mg total) by mouth daily. Needs appt for future refills, Disp: 30 tablet, Rfl:  6   digoxin (LANOXIN) 0.125 MG tablet, Take 1 tablet (0.125 mg total) by mouth daily., Disp: 90 tablet, Rfl: 3   doxylamine, Sleep, (UNISOM) 25 MG tablet, Take 25 mg by mouth at bedtime as needed for sleep., Disp: , Rfl:    ferrous sulfate 325 (65 FE) MG tablet, Take 325 mg by mouth daily with breakfast., Disp: , Rfl:    losartan (COZAAR) 25 MG tablet, Take 1/2 tablet (12.5 mg total) by mouth at bedtime., Disp: 15 tablet, Rfl: 3   Multiple Vitamin (MULTIVITAMIN WITH MINERALS) TABS, Take 1 tablet by mouth daily., Disp: , Rfl:    potassium chloride SA (KLOR-CON M) 20 MEQ tablet, Take 4 tablets (80 mEq total) by mouth every morning AND 4 tablets (80 mEq total) every evening., Disp: 240 tablet, Rfl: 11   spironolactone (ALDACTONE)  25 MG tablet, Take 1 tablet (25 mg total) by mouth daily. Hold if systolic blood pressure less than 100., Disp: 45 tablet, Rfl: 3   torsemide (DEMADEX) 20 MG tablet, Take 4 tablets (80 mg total) by mouth in the morning AND 2 tablets (40 mg total) every evening., Disp: 545 tablet, Rfl: 2   carvedilol (COREG) 6.25 MG tablet, Take 6.25 mg by mouth 2 (two) times daily with a meal. (Patient not taking: Reported on 07/15/2023), Disp: , Rfl:  No Known Allergies    Social History   Socioeconomic History   Marital status: Single    Spouse name: Not on file   Number of children: 3   Years of education: Not on file   Highest education level: Not on file  Occupational History   Occupation: Database administrator: [employer]   Occupation: office cleaning  Tobacco Use   Smoking status: Never   Smokeless tobacco: Never  Vaping Use   Vaping status: Never Used  Substance and Sexual Activity   Alcohol use: Yes    Alcohol/week: 5.0 standard drinks of alcohol    Types: 5 Glasses of wine per week    Comment: daily   Drug use: Never   Sexual activity: Yes    Birth control/protection: Condom  Other Topics Concern   Not on file  Social History Narrative   Not on file   Social Determinants of Health   Financial Resource Strain: High Risk (03/03/2023)   Overall Financial Resource Strain (CARDIA)    Difficulty of Paying Living Expenses: Very hard  Food Insecurity: No Food Insecurity (07/02/2022)   Hunger Vital Sign    Worried About Running Out of Food in the Last Year: Never true    Ran Out of Food in the Last Year: Never true  Transportation Needs: No Transportation Needs (07/02/2022)   PRAPARE - Administrator, Civil Service (Medical): No    Lack of Transportation (Non-Medical): No  Physical Activity: Not on file  Stress: Not on file  Social Connections: Not on file  Intimate Partner Violence: Not on file    Physical Exam      Future Appointments  Date Time Provider  Department Center  08/17/2023  1:00 PM Bensimhon, Bevelyn Buckles, MD [facility] None  09/14/2023  3:05 PM [facility] DEVICE REMOTES [facility]  12/14/2023  3:05 PM [facility] DEVICE REMOTES [facility]  03/14/2024  3:05 PM [facility] DEVICE REMOTES [facility]       Kerry Hough, Paramedic [phone] [facility] Paramedic  07/16/23

## 2023-07-23 ENCOUNTER — Other Ambulatory Visit (HOSPITAL_COMMUNITY): Payer: Self-pay

## 2023-07-23 NOTE — Progress Notes (Signed)
Paramedicine Encounter    Patient ID: Sydney Shaffer, female    DOB: 1966-08-17, 57 y.o.   MRN: [id number]   Complaints-same--gets tired easily, sob with exertion easily, dizziness at times  Edema-  Compliance with meds-missed 2 days   Pill box filled-yes  If so, by whom-paramedic   Refills needed-torsemide    Pt reports she did take the insurance removal letter to ss office, after she sat there all day they told her that the case worker she needed to speak to about that was not even there. She did leave a copy of that letter with them to give to her and she has not heard anything back yet.  The problem with this is, she is unable to afford her meds with this other private insurance still showing in system and medicaid is not covering her meds b/c this other insurance is. Her co-pays are expensive for some of her meds considering she has no income.    We called this number to have her other insurance removed, they told us to call the customer services number on her member card.  We called that number and when she called on 8/18-the case is still open-she said usually it takes about a month to complete, but she will put in an urgent request since obtaining meds are an issue for her and should be removed from 1-24hrs from now.    She reports possibly moving soon due to her unable to get caught up on rent.   Brought her eliquis samples due to copay costs.    She has home bp cuff but batteries dead and same to scales. She is going to try to get more batteries.   Pt denies c/p, she does have dizziness for past couple days. Not worse than usual-its the same as normal.   BP 110/78   Pulse 88   Resp 16   Wt 170 lb (77.1 kg)   SpO2 98%   BMI 25.85 kg/m  Weight yesterday-? Last visit weight-170  Patient Care Team: Patient, No Pcp Per as PCP - General (General Practice)  Patient Active Problem List   Diagnosis Date Noted  . Dilated cardiomyopathy (HCC) 06/04/2015  . Hypokalemia    . HTN (hypertension) 03/30/2014  . Chest pressure- unspecified 02/03/2014  . Atrial tachycardia 10/01/2012  . 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  . Hypertensive heart disease with CHF (congestive heart failure) (HCC) 08/04/2012  . 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 Anemia 03/29/2012    Current Outpatient Medications:  .  acetaminophen (TYLENOL) 500 MG tablet, Take 1,000 mg by mouth every 6 (six) hours as needed for moderate pain., Disp: , Rfl:  .  amiodarone (PACERONE) 200 MG tablet, Take 1/2 tablet (100 mg total) by mouth daily., Disp: 15 tablet, Rfl: 6 .  apixaban (ELIQUIS) 5 MG TABS tablet, Take 1 tablet (5 mg total) by mouth 2 (two) times daily., Disp: 60 tablet, Rfl: 6 .  carvedilol (COREG) 3.125 MG tablet, Take 1 tablet (3.125 mg total) by mouth 2 (two) times daily., Disp: 60 tablet, Rfl: 3 .  cyclobenzaprine (FLEXERIL) 10 MG tablet, Take 1 tablet (10 mg total) by mouth 3 (three) times daily as needed for muscle spasms., Disp: 20 tablet, Rfl: 0 .  dapagliflozin propanediol (FARXIGA) 10 MG TABS tablet, Take 1 tablet (10 mg total) by mouth daily. Needs appt for future refills, Disp: 30 tablet, Rfl: 6 .  digoxin (LANOXIN) 0.125 MG tablet, Take 1 tablet (0.125 mg total) by mouth daily., Disp:  90 tablet, Rfl: 3 .  doxylamine, Sleep, (UNISOM) 25 MG tablet, Take 25 mg by mouth at bedtime as needed for sleep., Disp: , Rfl:  .  losartan (COZAAR) 25 MG tablet, Take 1/2 tablet (12.5 mg total) by mouth at bedtime., Disp: 15 tablet, Rfl: 3 .  Multiple Vitamin (MULTIVITAMIN WITH MINERALS) TABS, Take 1 tablet by mouth daily., Disp: , Rfl:  .  potassium chloride SA (KLOR-CON M) 20 MEQ tablet, Take 4 tablets (80 mEq total) by mouth every morning AND 4 tablets (80 mEq total) every evening., Disp: 240 tablet, Rfl: 11 .  spironolactone (ALDACTONE) 25 MG tablet, Take 1 tablet (25 mg total) by mouth daily. Hold if systolic blood pressure less than 100. (Patient taking  differently: Take 25 mg by mouth at bedtime. Hold if systolic blood pressure less than 100.), Disp: 45 tablet, Rfl: 3 .  torsemide (DEMADEX) 20 MG tablet, Take 4 tablets (80 mg total) by mouth in the morning AND 2 tablets (40 mg total) every evening., Disp: 545 tablet, Rfl: 2 .  carvedilol (COREG) 6.25 MG tablet, Take 6.25 mg by mouth 2 (two) times daily with a meal. (Patient not taking: Reported on 07/15/2023), Disp: , Rfl:  .  ferrous sulfate 325 (65 FE) MG tablet, Take 325 mg by mouth daily with breakfast., Disp: , Rfl:  No Known Allergies    Social History   Socioeconomic History  . Marital status: Single    Spouse name: Not on file  . Number of children: 3  . Years of education: Not on file  . Highest education level: Not on file  Occupational History  . Occupation: Database administrator: [employer]  . Occupation: office cleaning  Tobacco Use  . Smoking status: Never  . Smokeless tobacco: Never  Vaping Use  . Vaping status: Never Used  Substance and Sexual Activity  . Alcohol use: Yes    Alcohol/week: 5.0 standard drinks of alcohol    Types: 5 Glasses of wine per week    Comment: daily  . Drug use: Never  . Sexual activity: Yes    Birth control/protection: Condom  Other Topics Concern  . Not on file  Social History Narrative  . Not on file   Social Determinants of Health   Financial Resource Strain: High Risk (03/03/2023)   Overall Financial Resource Strain (CARDIA)   . Difficulty of Paying Living Expenses: Very hard  Food Insecurity: No Food Insecurity (07/02/2022)   Hunger Vital Sign   . Worried About Programme researcher, broadcasting/film/video in the Last Year: Never true   . Ran Out of Food in the Last Year: Never true  Transportation Needs: No Transportation Needs (07/02/2022)   PRAPARE - Transportation   . Lack of Transportation (Medical): No   . Lack of Transportation (Non-Medical): No  Physical Activity: Not on file  Stress: Not on file  Social Connections: Not on file   Intimate Partner Violence: Not on file    Physical Exam      Future Appointments  Date Time Provider Department Center  08/17/2023  1:00 PM Bensimhon, Bevelyn Buckles, MD [facility] None  09/14/2023  3:05 PM [facility] DEVICE REMOTES [facility]  12/14/2023  3:05 PM [facility] DEVICE REMOTES [facility]  03/14/2024  3:05 PM [facility] DEVICE REMOTES [facility]       Kerry Hough, Paramedic [phone] [facility] Paramedic  07/23/23

## 2023-07-24 ENCOUNTER — Other Ambulatory Visit (HOSPITAL_COMMUNITY): Payer: Self-pay

## 2023-07-30 ENCOUNTER — Telehealth (HOSPITAL_COMMUNITY): Payer: Self-pay | Admitting: Licensed Clinical Social Worker

## 2023-07-30 ENCOUNTER — Telehealth (HOSPITAL_COMMUNITY): Payer: Self-pay

## 2023-07-30 NOTE — Telephone Encounter (Signed)
H&V Care Navigation CSW Progress Note  Clinical Social Worker informed pt asking about rental assistance.  CSW has spoken with pt about housing concern before in march and she was $2,000 over rent and was only working part time so did not make enough to cover rent.  Now patient is $5000 over.  States she is getting some help from her children in paying things but she only makes $400/month.    CSW explained we could not assist with backpay of such a large amount and that we could not do partial payment unless there was a realistic plan for how rent would be paid moving forward.  Pt expressed understanding.  Discussed that pt should consider plan B like moving in with family as eviction seems inevitable since she can't work more, is awaiting diability, and is being considered for transplant so likely won't be able to work for some time.  She reports sufficient food in the house at this time- has lost food stamps due to recertification need but plans to do this tomorrow.  Patient is participating in a Managed Medicaid Plan:  Yes  SDOH Screenings   Food Insecurity: No Food Insecurity (07/02/2022)  Housing: Medium Risk (03/03/2023)  Transportation Needs: No Transportation Needs (07/02/2022)  Depression (PHQ2-9): Medium Risk (05/14/2023)  Financial Resource Strain: High Risk (03/03/2023)  Tobacco Use: Low Risk  (05/14/2023)    Pt will reach out to CSW to discuss further if needed but will consider her options for now.  Burna Sis, LCSW Clinical Social Worker [facility] Desk#: [phone] Cell#: [phone]

## 2023-07-30 NOTE — Telephone Encounter (Signed)
Pt called me to cancel our appointment today.   She also asked about rent assistance. So I referred that to [place] and asked jenna to give her a call.   Kerry Hough, EMT-Paramedic  [phone] 07/30/2023

## 2023-08-04 ENCOUNTER — Other Ambulatory Visit (HOSPITAL_COMMUNITY): Payer: Self-pay | Admitting: Family Medicine

## 2023-08-06 ENCOUNTER — Other Ambulatory Visit (HOSPITAL_COMMUNITY): Payer: Self-pay

## 2023-08-06 NOTE — Progress Notes (Signed)
Paramedicine Encounter    Patient ID: Sydney Shaffer, female    DOB: 1966/08/06, 57 y.o.   MRN: [id number]   Complaints-stressed about housing   Edema-no  Compliance with meds-reports yes   Pill box filled-yes  If so, by whom-pill box   Refills needed-potasium    Pt reports she has been stressed with her housing issues.  She did go reapply for food stamps.  Looking to move in with her son in the next few wks. No income yet and behind a great deal in rent.  Pt reports some dizziness at time.  Her breathing is doing ok.  No c/p.  She did get her insurance handled so now its only medicaid showing at pharmacy.  Meds verified and pill box refilled.  Will look into some more possible resources to help her with rent.      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   Anemia 03/29/2012    Current Outpatient Medications:    acetaminophen (TYLENOL) 500 MG tablet, Take 1,000 mg by mouth every 6 (six) hours as needed for moderate pain., Disp: , Rfl:    amiodarone (PACERONE) 200 MG tablet, Take 1/2 tablet (100 mg total) by mouth daily., Disp: 15 tablet, Rfl: 6   apixaban (ELIQUIS) 5 MG TABS tablet, Take 1 tablet (5 mg total) by mouth 2 (two) times daily., Disp: 60 tablet, Rfl: 6   carvedilol (COREG) 3.125 MG tablet, Take 1 tablet (3.125 mg total) by mouth 2 (two) times daily., Disp: 60 tablet, Rfl: 3   dapagliflozin propanediol  (FARXIGA) 10 MG TABS tablet, Take 1 tablet (10 mg total) by mouth daily. Needs appt for future refills, Disp: 30 tablet, Rfl: 6   digoxin (LANOXIN) 0.125 MG tablet, Take 1 tablet (0.125 mg total) by mouth daily., Disp: 90 tablet, Rfl: 3   ferrous sulfate 325 (65 FE) MG tablet, Take 325 mg by mouth daily with breakfast., Disp: , Rfl:    losartan (COZAAR) 25 MG tablet, Take 1/2 tablet (12.5 mg total) by mouth at bedtime., Disp: 15 tablet, Rfl: 3   Multiple Vitamin (MULTIVITAMIN WITH MINERALS) TABS, Take 1 tablet by mouth daily., Disp: , Rfl:    potassium chloride SA (KLOR-CON M) 20 MEQ tablet, Take 4 tablets (80 mEq total) by mouth every morning AND 4 tablets (80 mEq total) every evening., Disp: 240 tablet, Rfl: 11   spironolactone (ALDACTONE) 25 MG tablet, Take 1 tablet (25 mg total) by mouth daily. Hold if systolic blood pressure less than 100. (Patient taking differently: Take 25 mg by mouth at bedtime. Hold if systolic blood pressure less than 100.), Disp: 45 tablet, Rfl: 3   torsemide (DEMADEX) 20 MG tablet, TAKE 4 TABLETS BY MOUTH IN THE MORNING AND 2 TABLETS EVERY EVENING, Disp: 545 tablet, Rfl: 2   carvedilol (COREG) 6.25 MG tablet, Take 6.25 mg by mouth 2 (two) times daily with a meal. (Patient not taking: Reported  on 07/15/2023), Disp: , Rfl:    cyclobenzaprine (FLEXERIL) 10 MG tablet, Take 1 tablet (10 mg total) by mouth 3 (three) times daily as needed for muscle spasms., Disp: 20 tablet, Rfl: 0   doxylamine, Sleep, (UNISOM) 25 MG tablet, Take 25 mg by mouth at bedtime as needed for sleep., Disp: , Rfl:  No Known Allergies    Social History   Socioeconomic History   Marital status: Single    Spouse name: Not on file   Number of children: 3   Years of education: Not on file   Highest education level: Not on file  Occupational History   Occupation: receptionist    Employer: [employer]   Occupation: office cleaning  Tobacco Use   Smoking status: Never   Smokeless tobacco: Never   Vaping Use   Vaping status: Never Used  Substance and Sexual Activity   Alcohol use: Yes    Alcohol/week: 5.0 standard drinks of alcohol    Types: 5 Glasses of wine per week    Comment: daily   Drug use: Never   Sexual activity: Yes    Birth control/protection: Condom  Other Topics Concern   Not on file  Social History Narrative   Not on file   Social Determinants of Health   Financial Resource Strain: High Risk (07/30/2023)   Overall Financial Resource Strain (CARDIA)    Difficulty of Paying Living Expenses: Very hard  Food Insecurity: No Food Insecurity (07/02/2022)   Hunger Vital Sign    Worried About Running Out of Food in the Last Year: Never true    Ran Out of Food in the Last Year: Never true  Transportation Needs: No Transportation Needs (07/02/2022)   PRAPARE - Administrator, Civil Service (Medical): No    Lack of Transportation (Non-Medical): No  Physical Activity: Not on file  Stress: Not on file  Social Connections: Not on file  Intimate Partner Violence: Not on file    Physical Exam      Future Appointments  Date Time Provider Department Center  08/17/2023  1:00 PM Bensimhon, Bevelyn Buckles, MD [facility] None  09/14/2023  3:05 PM [facility] DEVICE REMOTES [facility]  12/14/2023  3:05 PM [facility] DEVICE REMOTES [facility]  03/14/2024  3:05 PM [facility] DEVICE REMOTES [facility]       Kerry Hough, Paramedic [phone] [facility] Paramedic  08/06/23

## 2023-08-11 ENCOUNTER — Telehealth (HOSPITAL_COMMUNITY): Payer: Self-pay

## 2023-08-13 NOTE — Telephone Encounter (Signed)
Reached out to pt regarding home visit this week, I am not able to do the 3pm on Thursday due to me being in clinic for another appointment, but she couldn't find another time to work- she reports for me to just meet her in clinic on Monday when she is seen there.   Kerry Hough, EMT-Paramedic  [phone] 08/11/2023

## 2023-08-17 ENCOUNTER — Ambulatory Visit (HOSPITAL_COMMUNITY)
Admission: RE | Admit: 2023-08-17 | Discharge: 2023-08-17 | Disposition: A | Discharge status: Home / Self Care | Payer: Medicaid Other | Source: Ambulatory Visit | Attending: Internal Medicine | Admitting: Internal Medicine

## 2023-08-17 ENCOUNTER — Encounter (HOSPITAL_COMMUNITY): Payer: Self-pay | Admitting: Internal Medicine

## 2023-08-17 ENCOUNTER — Other Ambulatory Visit (HOSPITAL_COMMUNITY): Payer: Self-pay

## 2023-08-17 ENCOUNTER — Other Ambulatory Visit (HOSPITAL_COMMUNITY): Payer: Self-pay | Admitting: *Deleted

## 2023-08-17 VITALS — BP 112/86 | HR 87 | Wt 182.2 lb

## 2023-08-17 DIAGNOSIS — I420 Dilated cardiomyopathy: Secondary | ICD-10-CM | POA: Diagnosis not present

## 2023-08-17 DIAGNOSIS — I110 Hypertensive heart disease with heart failure: Secondary | ICD-10-CM | POA: Diagnosis not present

## 2023-08-17 DIAGNOSIS — Z7901 Long term (current) use of anticoagulants: Secondary | ICD-10-CM | POA: Diagnosis not present

## 2023-08-17 DIAGNOSIS — Z95810 Presence of automatic (implantable) cardiac defibrillator: Secondary | ICD-10-CM | POA: Insufficient documentation

## 2023-08-17 DIAGNOSIS — Z79899 Other long term (current) drug therapy: Secondary | ICD-10-CM | POA: Insufficient documentation

## 2023-08-17 DIAGNOSIS — I5022 Chronic systolic (congestive) heart failure: Secondary | ICD-10-CM | POA: Insufficient documentation

## 2023-08-17 DIAGNOSIS — I471 Supraventricular tachycardia, unspecified: Secondary | ICD-10-CM | POA: Insufficient documentation

## 2023-08-17 DIAGNOSIS — I472 Ventricular tachycardia, unspecified: Secondary | ICD-10-CM

## 2023-08-17 LAB — BASIC METABOLIC PANEL
Anion gap: 15 (ref 5–15)
BUN: 12 mg/dL (ref 6–20)
CO2: 29 mmol/L (ref 22–32)
Calcium: 10.1 mg/dL (ref 8.9–10.3)
Chloride: 97 mmol/L — ABNORMAL LOW (ref 98–111)
Creatinine, Ser: 0.97 mg/dL (ref 0.44–1.00)
GFR, Estimated: >60 mL/min (ref >60)
Glucose, Bld: 93 mg/dL (ref 70–99)
Potassium: 3.5 mmol/L (ref 3.5–5.1)
Sodium: 141 mmol/L (ref 135–145)

## 2023-08-17 LAB — DIGOXIN LEVEL: Digoxin Level: 0.2 ng/mL — ABNORMAL LOW (ref 0.8–2.0)

## 2023-08-17 LAB — BRAIN NATRIURETIC PEPTIDE: B Natriuretic Peptide: 606 pg/mL — ABNORMAL HIGH (ref 0.0–100.0)

## 2023-08-17 NOTE — Patient Instructions (Signed)
No medication changes today Return to Heart Failure clinic in 2 months for follow up with Dr Gala Romney Please notify the Heart Failure clinic immediately if you are feeling unwell

## 2023-08-17 NOTE — Progress Notes (Signed)
[facility] Note   PCP: Farley Ly, PA-C ([facility]) EP: Dr Graciela Husbands Medtronic ICD HF Cardiologist: Dr. Gala Romney  HPI: Sydney Shaffer is a 57 y.o.female with chronic systolic HF due to NICM s/p Medtronic ICD, HTN, and SVT s/p 2/15 ablation.  Cath 4/13 and 10/17 normal coronaries.  Lost to follow up until 5/19. Echo 6/19 EF 30-35% mild MR. S/p ICD gen change 12/20.  Reestablished with HF clinic in April 2023.    Echo 4/23: EF 25-30%, LV severely dilated, grade III DD, RV okay, RVSP 49 mmHg, moderate MR, mild to moderate TR   Admitted 7/23 with ADHF after being out of meds. Referred for paramedicine.  Amiodarone increased d/t episodes of VT (monitor only) and runs SVT and AF.   CPX 4/24  FVC 1.67 (52%)      FEV1 1.37 (54%)        FEV1/FVC 82 (103%)        MVV 50 (49%)      BP rest: 88/68 ->  BP peak: 122/68  pVO2: 12.4 (55% predicted peak VO2) VE/VCO2 slope:  31  Peak RER: 1.12  VE/MVV:  79% (VE/FEV1*40 = 72%)   Echo  03/02/23 EF 20-25% RV mild HK   R/L cath 07/01/23 Ao = 89/68 (80) LV = 105/28 RA = 4 RV = 47/6 PA = 46/20 (31) PCW = 19 (v = 25-30) Fick cardiac output/index =4.5/2.3 Thermo CP/CI = 3.9/2.0 PVR = 2.6 WU Ao sat = 91% PA sat = 62%, 64%  Returns for f/u. Overall doing ok. Following with Paramedicine. Can got to store and walk aisles slowly without stopping. No CP, orthopnea or PND. Compliant with meds.  Cardiac Studies:  - Echo (4/23): EF 25-30% - Echo (10/17): EF 25-30%, Grade 1 DD - Echo (6/16): EF 30-35% mild MR - Echo (2/15): EF 20-25%  - Echo (7/13): EF 20-25%   R/LHC (10/17): normal cors  CPX (5/18)  Peak VO2: 12.3 (61% predicted peak VO2) VE/VCO2 slope:  25 Peak RER: 0.98  CPX (6/15):  Peak VO2: 12.6 (57.8% predicted peak VO2) VE/VCO2 slope: 28.6 Peak RER: 1.01   SH: She is not a smoker. She does not drink alcohol. Lives alone. She has 3 grown children   FH: Mom breast cancer. Father cancer.   ROS: All  systems negative except as listed in HPI, PMH and Problem List.  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    Chronic systolic heart failure (HCC)    Dysrhythmia    Bradycardia   Hypertension    Hypertensive heart disease with CHF (HCC)    ICD (implantable cardiac defibrillator) in place 08/13/2012   Menorrhagia    Metrorrhagia 06/04/2015   SVT (supraventricular tachycardia) (HCC) 03/30/2014   VT (ventricular tachycardia) (HCC) 07/22/2014   Current Outpatient Medications  Medication Sig Dispense Refill   acetaminophen (TYLENOL) 500 MG tablet Take 1,000 mg by mouth every 6 (six) hours as needed for moderate pain.     amiodarone (PACERONE) 200 MG tablet Take 1/2 tablet (100 mg total) by mouth daily. 15  tablet 6   apixaban (ELIQUIS) 5 MG TABS tablet Take 1 tablet (5 mg total) by mouth 2 (two) times daily. 60 tablet 6   carvedilol (COREG) 3.125 MG tablet Take 1 tablet (3.125 mg total) by mouth 2 (two) times daily. 60 tablet 3   cyclobenzaprine (FLEXERIL) 10 MG tablet Take 1 tablet (10 mg total) by mouth 3 (three) times daily as needed for muscle spasms. 20 tablet 0   dapagliflozin propanediol (FARXIGA) 10 MG TABS tablet Take 1 tablet (10 mg total) by mouth daily. Needs appt for future refills 30 tablet 6   digoxin (LANOXIN) 0.125 MG tablet Take 1 tablet (0.125 mg total) by mouth daily. 90 tablet 3   ferrous sulfate 325 (65 FE) MG tablet Take 325 mg by mouth daily with breakfast.     losartan (COZAAR) 25 MG tablet Take 1/2 tablet (12.5 mg total) by mouth at bedtime. 15 tablet 3   Multiple Vitamin (MULTIVITAMIN WITH MINERALS) TABS Take 1 tablet  by mouth daily.     potassium chloride SA (KLOR-CON M) 20 MEQ tablet Take 4 tablets (80 mEq total) by mouth every morning AND 4 tablets (80 mEq total) every evening. 240 tablet 11   spironolactone (ALDACTONE) 25 MG tablet Take 1 tablet (25 mg total) by mouth daily. Hold if systolic blood pressure less than 100. (Patient taking differently: Take 25 mg by mouth at bedtime. Hold if systolic blood pressure less than 100.) 45 tablet 3   torsemide (DEMADEX) 20 MG tablet TAKE 4 TABLETS BY MOUTH IN THE MORNING AND 2 TABLETS EVERY EVENING 545 tablet 2   carvedilol (COREG) 6.25 MG tablet Take 6.25 mg by mouth 2 (two) times daily with a meal. (Patient not taking: Reported on 07/15/2023)     doxylamine, Sleep, (UNISOM) 25 MG tablet Take 25 mg by mouth at bedtime as needed for sleep. (Patient not taking: Reported on 08/17/2023)     No current facility-administered medications for this encounter.   BP 112/86 Comment: map 94  Pulse 87 Comment: SR  Wt 82.6 kg (182 lb 3.2 oz)   SpO2 96%   BMI 27.70 kg/m   Wt Readings from Last 3 Encounters:  08/17/23 82.6 kg (182 lb 3.2 oz)  08/06/23 77.1 kg (170 lb)  07/23/23 77.1 kg (170 lb)   Vitals:   08/17/23 1328  BP: 112/86  Pulse: 87  SpO2: 96%  Weight: 82.6 kg (182 lb 3.2 oz)    PHYSICAL EXAM: General:  Sitting on exam table No resp difficulty HEENT: normal Neck: supple. JVP 7-8. Carotids 2+ bilat; no bruits. No lymphadenopathy or thryomegaly appreciated. Cor: Regular rate & rhythm. No rubs, gallops or murmurs. Lungs: clear Abdomen: soft, nontender, nondistended. No hepatosplenomegaly. No bruits or masses. Good bowel sounds. Extremities: no cyanosis, clubbing, rash, edema Neuro: alert & orientedx3, cranial nerves grossly intact. moves all 4 extremities w/o difficulty. Affect pleasant  Device interrogation: No VT/AF. Optivol mildly elevated. Personally reviewed  ASSESSMENT & PLAN:  1. Chronic Systolic Heart Failure:  - Normal cors by cath 2013.  Nonischemic cardiomyopathy s/p Medtronic ICD.  - Echo (6/16): EF 30-35%. - Echo (10/17): EF 25-30%.   - Echo (6/19): EF 30-35%. - Echo (4/23): EF 25-30% - Echo /25/24 EF 20-25% RV mild HK Personally reviewed -> echo concerning for LVNC will need cMRI - ICD generator change 11/14/19 - CPX 4/24 FVC 1.67 (52%) FEV1 1.37 (54%) FEV1/FVC 82 (103%) BP rest: 88/68 ->  BP peak: 122/68 pVO2: 12.4 (55% predicted peak VO2)  VE/VCO2 31 pRER: 1.12 VE/MVV:  79% - R/L cath 07/01/23 normal cors RA 4 PA 46/20 (31) PCW 19 (v = 25-30) Fick 4.5/2.3 Thermo 3.9/2.0 PVR 2.6 WU - Stable NYHA III-IIIB - Volume status mildly elevated on exam and Optivol. Continue torsemide 80/40 - can take 80/80 as needed - Continue losartan 12.5 qhs - Continue spiro 25 mg daily - Continue Coreg to 3.125 mg bid (dose decreased due to intolerance) - Continue Farxiga 10 mg daily - Continue digoxin 0.125 daily  - Continue Eliquis for possible LVNC  - Blood Type O - She is interested in advanced therapies (particularly transplant) but still may be a bit early. Will continue to follow closely                                                                                            -  2. SVT and VT  - s/p atrial tachycardia ablation in 01/2014.   - No VT on ICD interrogation today - Continue amio 100 daily - She follows with Dr. Graciela Husbands.   3. SDOH - Compliance has improved greatly  with Paramedicine program. Appreciate assistance. - Continue support  Arvilla Meres, MD 08/17/2023

## 2023-08-17 NOTE — Progress Notes (Signed)
Paramedicine Encounter   Patient ID: Sydney Shaffer , female,   DOB: 06/06/1966,57 y.o.,  MRN: [id number]   Met patient in clinic today with provider.  Weight @ clinic-182 B/P-112/86 P-87 [id number]   Pt not feeling well today. She is reporting dizziness today. Worse than usual. Poor sleep at night time.  She thinks she got something from one of the kids. No fevers. No n/v/d. Just tired and dizzy.   From her last heart cath, provider is thinking she is now in the window to decide to pursue more advanced therapies or not.  She wants to wait to move forward for now since she has a lot of social things going on at the moment with housing.  Weight up 2lbs from last clinic visit.  If this continues to increase then she can increase her torsemide to 80/80 for a few days.   She will fill up pill box and I will f/u on Thursday.    Med changes-increase torsemide to 80/80 if needed   Kerry Hough, EMT-Paramedic [phone] 08/17/2023

## 2023-08-17 NOTE — Progress Notes (Signed)
Patient presents for 2 month f/u in [facility] today for continued transplant vs VAD discussion.   Denies lightheadedness, falls, shortness of breath, heart failure symptoms, and signs of bleeding. Reports she started feeling poorly (dizziness, fatigue, mild body aches, mild chills) this morning. States she was around her grandchildren this weekend, all of whom were sick with colds. Afebrile today in clinic. Prior to today she states she has been in her usual state of health.   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 She is able to walk "at her own pace" through the grocery store. She is able to complete ADLs without difficulty. Katie with paramedicine fills pt's pillbox. Katie present in clinic today.   Dr Gala Romney had long discussion with pt about moving forward with advanced therapies vs watchful waiting based off recent RHC/CPX results. Pt wishes to pursue watchful waiting at this point. Dr Gala Romney instructed pt to call heart failure clinic immediately if her heart failure symptoms worsen. She verbalized understanding. Will plan to transition pt back to heart failure clinic for follow up at this time.   BMET, BNP, and Digoxin level drawn today per Dr Gala Romney.    Vital Signs:  Temp: 98.2 HR: 87 SR BP: 112/86 (94) SPO2: 96% on RA   Weight: 182.2 lb  Last weight: 180 lb Home weights:  lbs   Symptom YES NO DETAILS  Angina  x Activity:  Claudication  x How Far:  Syncope  x When:  Stroke  x   Orthopnea  x How many pillows:  PND  x How often:  CPAP  x How many hours:  Pedal Edema  x   Abdominal Fullness  x   Nausea / Vomit  x   Diaphoresis  x When:  Shortness of Breath  x Activity:  Palpitations  x When:  ICD shock  x   Bleeding S/S  x   Tea-colored Urine  x   Hospitalizations  x   Emergency Room  x   Other MD     Activity Able to complete ADLs without difficulty  Fluid   Diet    Device: Medtronic Therapies: on- VF 200 VT 182 Last check:  06/15/23   Patient Instructions:  No medication changes today Return to Heart Failure clinic in 2 months for follow up with Dr Gala Romney Please notify the Heart Failure clinic immediately if you are feeling unwell    Alyce Pagan RN VAD Coordinator  Office: [phone]  24/7 Pager: [phone]

## 2023-08-20 ENCOUNTER — Other Ambulatory Visit (HOSPITAL_COMMUNITY): Payer: Self-pay

## 2023-08-20 NOTE — Progress Notes (Signed)
Paramedicine Encounter    Patient ID: Sydney Shaffer, female    DOB: 1966/05/10, 57 y.o.   MRN: [id number]   Complaints-same   Edema-mild   Compliance with meds-mostly   Pill box filled-yes  If so, by whom-paramedic   Refills needed-amio and losartan   Came today for med rec and check in after her clinic visit this week. Meds verified and pill box refilled.   No meds today yet.  She is still trying to figure out her living arrangements and then she will make a choice about further therapies.    BP 110/80   Pulse (!) 104   Resp 18   Wt 181 lb (82.1 kg)   SpO2 98%   BMI 27.52 kg/m  Weight yesterday-? 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   Anemia 03/29/2012    Current Outpatient Medications:    acetaminophen (TYLENOL) 500 MG tablet, Take 1,000 mg by mouth every 6 (six) hours as needed for moderate pain., Disp: , Rfl:    amiodarone (PACERONE) 200 MG tablet, Take 1/2 tablet (100 mg total) by mouth daily., Disp: 15 tablet, Rfl: 6   apixaban (ELIQUIS) 5 MG TABS tablet, Take 1 tablet (5 mg total) by mouth 2 (two) times daily., Disp: 60 tablet, Rfl: 6   carvedilol (COREG) 3.125 MG tablet, Take 1 tablet (3.125 mg total) by mouth 2 (two) times daily., Disp: 60 tablet, Rfl: 3   cyclobenzaprine (FLEXERIL) 10 MG tablet, Take 1 tablet (10 mg total) by mouth 3 (three) times daily as needed for muscle spasms., Disp: 20 tablet, Rfl: 0   dapagliflozin propanediol (FARXIGA) 10 MG TABS tablet, Take 1 tablet (10 mg total) by  mouth daily. Needs appt for future refills, Disp: 30 tablet, Rfl: 6   digoxin (LANOXIN) 0.125 MG tablet, Take 1 tablet (0.125 mg total) by mouth daily., Disp: 90 tablet, Rfl: 3   doxylamine, Sleep, (UNISOM) 25 MG tablet, Take 25 mg by mouth at bedtime as needed for sleep. (Patient not taking: Reported on 08/17/2023), Disp: , Rfl:    ferrous sulfate 325 (65 FE) MG tablet, Take 325 mg by mouth daily with breakfast., Disp: , Rfl:    losartan (COZAAR) 25 MG tablet, Take 1/2 tablet (12.5 mg total) by mouth at bedtime., Disp: 15 tablet, Rfl: 3   Multiple Vitamin (MULTIVITAMIN WITH MINERALS) TABS, Take 1 tablet by mouth daily., Disp: , Rfl:    potassium chloride SA (KLOR-CON M) 20 MEQ tablet, Take 4 tablets (80 mEq total) by mouth every morning AND 4 tablets (80 mEq total) every evening., Disp: 240 tablet, Rfl: 11   spironolactone (ALDACTONE) 25 MG tablet, Take 1 tablet (25 mg total) by mouth daily. Hold if systolic blood pressure less than 100. (Patient taking differently: Take 25 mg by mouth at bedtime. Hold if systolic blood pressure less than 100.), Disp: 45 tablet, Rfl: 3   torsemide (DEMADEX) 20 MG tablet, TAKE 4 TABLETS BY MOUTH IN THE MORNING AND 2 TABLETS EVERY EVENING, Disp: 545 tablet, Rfl: 2 No Known Allergies    Social History  Socioeconomic History   Marital status: Single    Spouse name: Not on file   Number of children: 3   Years of education: Not on file   Highest education level: Not on file  Occupational History   Occupation: receptionist    Employer: [employer]   Occupation: office cleaning  Tobacco Use   Smoking status: Never   Smokeless tobacco: Never  Vaping Use   Vaping status: Never Used  Substance and Sexual Activity   Alcohol use: Yes    Alcohol/week: 5.0 standard drinks of alcohol    Types: 5 Glasses of wine per week    Comment: daily   Drug use: Never   Sexual activity: Yes    Birth control/protection: Condom  Other Topics Concern   Not on file  Social  History Narrative   Not on file   Social Determinants of Health   Financial Resource Strain: High Risk (07/30/2023)   Overall Financial Resource Strain (CARDIA)    Difficulty of Paying Living Expenses: Very hard  Food Insecurity: No Food Insecurity (07/02/2022)   Hunger Vital Sign    Worried About Running Out of Food in the Last Year: Never true    Ran Out of Food in the Last Year: Never true  Transportation Needs: No Transportation Needs (07/02/2022)   PRAPARE - Administrator, Civil Service (Medical): No    Lack of Transportation (Non-Medical): No  Physical Activity: Not on file  Stress: Not on file  Social Connections: Not on file  Intimate Partner Violence: Not on file    Physical Exam      Future Appointments  Date Time Provider Department Center  09/14/2023  3:05 PM [facility] DEVICE REMOTES [facility]  10/26/2023  1:40 PM Bensimhon, Bevelyn Buckles, MD [facility] None  12/14/2023  3:05 PM [facility] DEVICE REMOTES [facility]  03/14/2024  3:05 PM [facility] DEVICE REMOTES [facility]       Kerry Hough, Paramedic [phone] [facility] Paramedic  08/20/23

## 2023-08-26 ENCOUNTER — Other Ambulatory Visit (HOSPITAL_COMMUNITY): Payer: Self-pay

## 2023-08-27 ENCOUNTER — Other Ambulatory Visit (HOSPITAL_COMMUNITY): Payer: Self-pay

## 2023-08-27 NOTE — Progress Notes (Signed)
Paramedicine Encounter    Patient ID: Sydney Shaffer, female    DOB: Sep 08, 1966, 57 y.o.   MRN: [id number]   Complaints-still having dizzy spells   Edema-slight   Compliance with meds-yes   Pill box filled-yes  If so, by whom-paramedic   Refills needed -eliquis  -losartan--waiting on office to send rx in  -digoxin  -Potassium--has enough thru today to tues am   Pharm is still showing her other insurance to cover meds-this was suppose to have been handled several wks ago.  I thought we had gotten a med refilled since then and it went thru fine. Pharm said [employer] did an override on that torsemide for that last fill.   No increased sob, no c/p, no palpitations   BP 100/82   Pulse 80   Resp 16   Wt 180 lb (81.6 kg)   SpO2 98%   BMI 27.37 kg/m  Weight yesterday-? 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   Anemia 03/29/2012    Current Outpatient Medications:    acetaminophen (TYLENOL) 500 MG tablet, Take 1,000 mg by mouth every 6 (six) hours as needed for moderate pain., Disp: , Rfl:    amiodarone (PACERONE) 200 MG tablet, Take 1/2 tablet (100 mg total) by mouth daily., Disp: 15 tablet, Rfl: 6   apixaban (ELIQUIS) 5 MG TABS tablet, Take 1 tablet (5 mg total) by mouth 2 (two) times daily., Disp: 60 tablet, Rfl: 6   carvedilol (COREG) 3.125 MG tablet, Take 1 tablet (3.125 mg total) by mouth 2 (two) times daily., Disp: 60 tablet, Rfl: 3   dapagliflozin propanediol (FARXIGA) 10 MG TABS tablet, Take 1 tablet (10 mg total)  by mouth daily. Needs appt for future refills, Disp: 30 tablet, Rfl: 6   digoxin (LANOXIN) 0.125 MG tablet, Take 1 tablet (0.125 mg total) by mouth daily., Disp: 90 tablet, Rfl: 3   ferrous sulfate 325 (65 FE) MG tablet, Take 325 mg by mouth daily with breakfast., Disp: , Rfl:    losartan (COZAAR) 25 MG tablet, Take 1/2 tablet (12.5 mg total) by mouth at bedtime., Disp: 15 tablet, Rfl: 3   Multiple Vitamin (MULTIVITAMIN WITH MINERALS) TABS, Take 1 tablet by mouth daily., Disp: , Rfl:    potassium chloride SA (KLOR-CON M) 20 MEQ tablet, Take 4 tablets (80 mEq total) by mouth every morning AND 4 tablets (80 mEq total) every evening., Disp: 240 tablet, Rfl: 11   spironolactone (ALDACTONE) 25 MG tablet, Take 1 tablet (25 mg total) by mouth daily. Hold if systolic blood pressure less than 100. (Patient taking differently: Take 25 mg by mouth at bedtime. Hold if systolic blood pressure less than 100.), Disp: 45 tablet, Rfl: 3   torsemide (DEMADEX) 20 MG tablet, TAKE 4 TABLETS BY MOUTH IN THE MORNING AND 2 TABLETS EVERY EVENING, Disp: 545 tablet, Rfl: 2   cyclobenzaprine (FLEXERIL) 10 MG tablet, Take 1 tablet (10 mg total) by mouth 3 (three) times daily as needed for muscle spasms., Disp: 20 tablet, Rfl: 0   doxylamine, Sleep, (UNISOM) 25 MG tablet, Take 25 mg by mouth at  bedtime as needed for sleep. (Patient not taking: Reported on 08/17/2023), Disp: , Rfl:  No Known Allergies    Social History   Socioeconomic History   Marital status: Single    Spouse name: Not on file   Number of children: 3   Years of education: Not on file   Highest education level: Not on file  Occupational History   Occupation: receptionist    Employer: [employer]   Occupation: office cleaning  Tobacco Use   Smoking status: Never   Smokeless tobacco: Never  Vaping Use   Vaping status: Never Used  Substance and Sexual Activity   Alcohol use: Yes    Alcohol/week: 5.0 standard drinks of alcohol    Types: 5 Glasses of  wine per week    Comment: daily   Drug use: Never   Sexual activity: Yes    Birth control/protection: Condom  Other Topics Concern   Not on file  Social History Narrative   Not on file   Social Determinants of Health   Financial Resource Strain: High Risk (07/30/2023)   Overall Financial Resource Strain (CARDIA)    Difficulty of Paying Living Expenses: Very hard  Food Insecurity: No Food Insecurity (07/02/2022)   Hunger Vital Sign    Worried About Running Out of Food in the Last Year: Never true    Ran Out of Food in the Last Year: Never true  Transportation Needs: No Transportation Needs (07/02/2022)   PRAPARE - Administrator, Civil Service (Medical): No    Lack of Transportation (Non-Medical): No  Physical Activity: Not on file  Stress: Not on file  Social Connections: Not on file  Intimate Partner Violence: Not on file    Physical Exam      Future Appointments  Date Time Provider Department Center  09/14/2023  3:05 PM [facility] DEVICE REMOTES [facility]  10/26/2023  1:40 PM Bensimhon, Bevelyn Buckles, MD [facility] None  12/14/2023  3:05 PM [facility] DEVICE REMOTES [facility]  03/14/2024  3:05 PM [facility] DEVICE REMOTES [facility]       Kerry Hough, Paramedic [phone] [facility] Paramedic  08/27/23

## 2023-08-28 ENCOUNTER — Telehealth (HOSPITAL_COMMUNITY): Payer: Self-pay

## 2023-08-28 ENCOUNTER — Other Ambulatory Visit (HOSPITAL_COMMUNITY): Payer: Self-pay

## 2023-08-28 MED ORDER — LOSARTAN POTASSIUM 25 MG PO TABS: 12.5000 mg | ORAL_TABLET | Freq: Every day | ORAL | 3 refills | Status: DC

## 2023-08-28 NOTE — Telephone Encounter (Signed)
Please send refills for Losartan to [facility] for Sydney Shaffer. Thanks!   Maralyn Sago, EMT-Paramedic [phone] 08/28/2023

## 2023-09-03 ENCOUNTER — Telehealth (HOSPITAL_COMMUNITY): Payer: Self-pay

## 2023-09-03 NOTE — Telephone Encounter (Signed)
Pt reached out to cancel our appoint for today.   Will f/u next week to see when she is avail.   Kerry Hough, EMT-Paramedic  [phone] 09/03/2023

## 2023-09-04 ENCOUNTER — Telehealth (HOSPITAL_COMMUNITY): Payer: Self-pay

## 2023-09-04 NOTE — Telephone Encounter (Signed)
Reached out to [facility] to verify Austina's medication refills for   -Eliquis -Losartan -Potassium -Digoxin   -Potassium and Digoxin are ready with $4 copays.  However Eliquis and Losartan are able to be processed due to the insurance kicking them back saying there is another primary insurance on file and being used as primary- but [facility] isn't able to see what insurance it is. [facility] advised the patient would need to reach out to [employer] and figure out what other plan is being processed and remove that plan if that's what she wants.   I called and left a VM with Carlina with these details and advised her to reach out to me, [facility] or katie (on Monday) for further explanation.   Call complete. I will have Florentina Addison continue follow up.   Maralyn Sago, EMT-Paramedic [phone] 09/04/2023

## 2023-09-07 ENCOUNTER — Telehealth (HOSPITAL_COMMUNITY): Payer: Self-pay

## 2023-09-07 ENCOUNTER — Other Ambulatory Visit (HOSPITAL_COMMUNITY): Payer: Self-pay

## 2023-09-07 NOTE — Telephone Encounter (Signed)
Advanced Heart Failure Patient Advocate Encounter  Paramed requested update about insurance coverage. This patient had a primary plan that ended, medicaid is still rejecting under 'submit to primary payer'  Contacted [employer], rep confirmed that the medical coverage was updated previously, however the pharmacy plan has not been updated. Patient will need to call [phone] to have this corrected.   Sent information to paramed to have patient call; will be available for future needs.  Burnell Blanks, CPhT Rx Patient Advocate Phone: [phone]

## 2023-09-10 ENCOUNTER — Other Ambulatory Visit (HOSPITAL_COMMUNITY): Payer: Self-pay

## 2023-09-10 NOTE — Progress Notes (Signed)
Paramedicine Encounter    Patient ID: Sydney Shaffer, female    DOB: Nov 18, 1966, 57 y.o.   MRN: [id number]   Complaints-no new complaints   Edema-yes-no meds at all today   Compliance with meds-reports so   Pill box filled-yes  If so, by whom-paramedic   Refills needed- -spiro -farxiga -torsemide   Pt reports she is doing ok.  She denies increased complaints. Still gets dizzy when she gets up too fast.  No c/p.  She does have edema to her legs. No meds yet today (3pm) but advised she took them last night.  Meds verified and pill box refilled  Will get the refills called in next week.      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   Anemia 03/29/2012    Current Outpatient Medications:    acetaminophen (TYLENOL) 500 MG tablet, Take 1,000 mg by mouth every 6 (six) hours as needed for moderate pain., Disp: , Rfl:    amiodarone (PACERONE) 200 MG tablet, Take 1/2 tablet (100 mg total) by mouth daily., Disp: 15 tablet, Rfl: 6   apixaban (ELIQUIS) 5 MG TABS tablet, Take 1 tablet (5 mg total) by mouth 2 (two) times daily., Disp: 60 tablet, Rfl: 6   carvedilol (COREG) 3.125 MG tablet, Take 1 tablet (3.125 mg total) by mouth 2 (two) times daily., Disp: 60 tablet, Rfl: 3   cyclobenzaprine (FLEXERIL) 10 MG tablet, Take 1 tablet (10 mg total) by mouth 3 (three) times daily as needed for muscle  spasms., Disp: 20 tablet, Rfl: 0   dapagliflozin propanediol (FARXIGA) 10 MG TABS tablet, Take 1 tablet (10 mg total) by mouth daily. Needs appt for future refills, Disp: 30 tablet, Rfl: 6   digoxin (LANOXIN) 0.125 MG tablet, Take 1 tablet (0.125 mg total) by mouth daily., Disp: 90 tablet, Rfl: 3   doxylamine, Sleep, (UNISOM) 25 MG tablet, Take 25 mg by mouth at bedtime as needed for sleep. (Patient not taking: Reported on 08/17/2023), Disp: , Rfl:    ferrous sulfate 325 (65 FE) MG tablet, Take 325 mg by mouth daily with breakfast., Disp: , Rfl:    losartan (COZAAR) 25 MG tablet, Take 1/2 tablet (12.5 mg total) by mouth at bedtime., Disp: 15 tablet, Rfl: 3   Multiple Vitamin (MULTIVITAMIN WITH MINERALS) TABS, Take 1 tablet by mouth daily., Disp: , Rfl:    potassium chloride SA (KLOR-CON M) 20 MEQ tablet, Take 4 tablets (80 mEq total) by mouth every morning AND 4 tablets (80 mEq total) every evening., Disp: 240 tablet, Rfl: 11   spironolactone (ALDACTONE) 25 MG tablet, Take 1 tablet (25 mg total) by mouth daily. Hold if systolic blood pressure less than 100. (Patient taking differently: Take 25 mg by mouth at bedtime. Hold if systolic blood pressure less than 100.), Disp: 45 tablet, Rfl: 3   torsemide (DEMADEX) 20 MG tablet, TAKE 4 TABLETS BY MOUTH IN THE MORNING AND  2 TABLETS EVERY EVENING, Disp: 545 tablet, Rfl: 2 No Known Allergies    Social History   Socioeconomic History   Marital status: Single    Spouse name: Not on file   Number of children: 3   Years of education: Not on file   Highest education level: Not on file  Occupational History   Occupation: receptionist    Employer: [employer]   Occupation: office cleaning  Tobacco Use   Smoking status: Never   Smokeless tobacco: Never  Vaping Use   Vaping status: Never Used  Substance and Sexual Activity   Alcohol use: Yes    Alcohol/week: 5.0 standard drinks of alcohol    Types: 5 Glasses of wine per week    Comment: daily   Drug  use: Never   Sexual activity: Yes    Birth control/protection: Condom  Other Topics Concern   Not on file  Social History Narrative   Not on file   Social Determinants of Health   Financial Resource Strain: High Risk (07/30/2023)   Overall Financial Resource Strain (CARDIA)    Difficulty of Paying Living Expenses: Very hard  Food Insecurity: No Food Insecurity (07/02/2022)   Hunger Vital Sign    Worried About Running Out of Food in the Last Year: Never true    Ran Out of Food in the Last Year: Never true  Transportation Needs: No Transportation Needs (07/02/2022)   PRAPARE - Administrator, Civil Service (Medical): No    Lack of Transportation (Non-Medical): No  Physical Activity: Not on file  Stress: Not on file  Social Connections: Not on file  Intimate Partner Violence: Not on file    Physical Exam      Future Appointments  Date Time Provider Department Center  09/14/2023  3:05 PM [facility] DEVICE REMOTES [facility]  10/26/2023  1:40 PM Bensimhon, Bevelyn Buckles, MD [facility] None  12/14/2023  3:05 PM [facility] DEVICE REMOTES [facility]  03/14/2024  3:05 PM [facility] DEVICE REMOTES [facility]       Kerry Hough, Paramedic [phone] [facility] Paramedic  09/10/23

## 2023-09-14 ENCOUNTER — Telehealth (HOSPITAL_COMMUNITY): Payer: Self-pay

## 2023-09-14 NOTE — Telephone Encounter (Signed)
Contacted [facility] to request the following for refills:  -spiro -farxiga -torsemide      Kerry Hough, Paramedic [phone] 09/14/23

## 2023-09-15 ENCOUNTER — Other Ambulatory Visit (HOSPITAL_COMMUNITY): Payer: Self-pay

## 2023-09-15 MED ORDER — DAPAGLIFLOZIN PROPANEDIOL 10 MG PO TABS: 10.0000 mg | ORAL_TABLET | Freq: Every day | ORAL | 6 refills | Status: DC

## 2023-09-16 ENCOUNTER — Other Ambulatory Visit (HOSPITAL_COMMUNITY): Payer: Self-pay

## 2023-09-17 ENCOUNTER — Telehealth (HOSPITAL_COMMUNITY): Payer: Self-pay

## 2023-09-17 NOTE — Telephone Encounter (Signed)
Pt reached out to cancel todays appointment.  She reported having everything she needs at this time.  I think the insurance issue finally got resolved.   Kerry Hough, EMT-Paramedic  [phone] 09/17/2023

## 2023-09-24 ENCOUNTER — Other Ambulatory Visit (HOSPITAL_COMMUNITY): Payer: Self-pay

## 2023-09-24 NOTE — Progress Notes (Signed)
Paramedicine Encounter    Patient ID: Sydney Shaffer, female    DOB: 1966/03/31, 57 y.o.   MRN: [id number]   Complaints-c/p last week   Edema-none   Compliance with meds-reports so   Pill box filled-yes  If so, by whom-paramedic   Refills needed-farxiga and torsemide  Marcelline Deist needs to be placed in pill box-she will p/u from pharm     Pt reports she is doing ok with the exception of an episode of c/p last week. She reported it as a tightness in her chest. She went to lay down and after 1-2 min it subsided. No sob, dizziness, nausea or sweating during that episode. It has not happened since.  She reports her breathing is doing the same. She still feels comfortable just watching and waiting and if she feels worse she will let us know.  Meds verified and pill box refilled   She still not able to send remote transmissions. She is going to try to get it to work again and call the number.    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   Anemia 03/29/2012    Current Outpatient Medications:    acetaminophen (TYLENOL) 500 MG tablet, Take 1,000 mg by mouth every 6 (six) hours as needed for moderate pain., Disp: , Rfl:    amiodarone (PACERONE) 200 MG tablet, Take 1/2 tablet (100 mg total) by mouth daily., Disp: 15 tablet, Rfl: 6   apixaban (ELIQUIS) 5 MG TABS tablet, Take 1 tablet (5 mg total) by mouth 2 (two) times daily.,  Disp: 60 tablet, Rfl: 6   carvedilol (COREG) 3.125 MG tablet, Take 1 tablet (3.125 mg total) by mouth 2 (two) times daily., Disp: 60 tablet, Rfl: 3   dapagliflozin propanediol (FARXIGA) 10 MG TABS tablet, Take 1 tablet (10 mg total) by mouth daily. Needs appt for future refills, Disp: 30 tablet, Rfl: 6   digoxin (LANOXIN) 0.125 MG tablet, Take 1 tablet (0.125 mg total) by mouth daily., Disp: 90 tablet, Rfl: 3   ferrous sulfate 325 (65 FE) MG tablet, Take 325 mg by mouth daily with breakfast., Disp: , Rfl:    losartan (COZAAR) 25 MG tablet, Take 1/2 tablet (12.5 mg total) by mouth at bedtime., Disp: 15 tablet, Rfl: 3   Multiple Vitamin (MULTIVITAMIN WITH MINERALS) TABS, Take 1 tablet by mouth daily., Disp: , Rfl:    potassium chloride SA (KLOR-CON M) 20 MEQ tablet, Take 4 tablets (80 mEq total) by mouth every morning AND 4 tablets (80 mEq total) every evening., Disp: 240 tablet, Rfl: 11   spironolactone (ALDACTONE) 25 MG tablet, Take 1 tablet (25 mg total) by mouth daily. Hold if systolic blood pressure less than 100. (Patient taking differently: Take 25 mg by mouth at bedtime. Hold if systolic blood pressure less than 100.), Disp: 45 tablet, Rfl: 3   torsemide (DEMADEX) 20 MG tablet, TAKE 4 TABLETS BY MOUTH IN THE MORNING AND  2 TABLETS EVERY EVENING, Disp: 545 tablet, Rfl: 2   cyclobenzaprine (FLEXERIL) 10 MG tablet, Take 1 tablet (10 mg total) by mouth 3 (three) times daily as needed for muscle spasms., Disp: 20 tablet, Rfl: 0   doxylamine, Sleep, (UNISOM) 25 MG tablet, Take 25 mg by mouth at bedtime as needed for sleep. (Patient not taking: Reported on 08/17/2023), Disp: , Rfl:  No Known Allergies    Social History   Socioeconomic History   Marital status: Single    Spouse name: Not on file   Number of children: 3   Years of education: Not on file   Highest education level: Not on file  Occupational History   Occupation: receptionist    Employer: [employer]   Occupation: office cleaning   Tobacco Use   Smoking status: Never   Smokeless tobacco: Never  Vaping Use   Vaping status: Never Used  Substance and Sexual Activity   Alcohol use: Yes    Alcohol/week: 5.0 standard drinks of alcohol    Types: 5 Glasses of wine per week    Comment: daily   Drug use: Never   Sexual activity: Yes    Birth control/protection: Condom  Other Topics Concern   Not on file  Social History Narrative   Not on file   Social Determinants of Health   Financial Resource Strain: High Risk (07/30/2023)   Overall Financial Resource Strain (CARDIA)    Difficulty of Paying Living Expenses: Very hard  Food Insecurity: No Food Insecurity (07/02/2022)   Hunger Vital Sign    Worried About Running Out of Food in the Last Year: Never true    Ran Out of Food in the Last Year: Never true  Transportation Needs: No Transportation Needs (07/02/2022)   PRAPARE - Administrator, Civil Service (Medical): No    Lack of Transportation (Non-Medical): No  Physical Activity: Not on file  Stress: Not on file  Social Connections: Not on file  Intimate Partner Violence: Not on file    Physical Exam      Future Appointments  Date Time Provider Department Center  10/26/2023  1:40 PM Bensimhon, Bevelyn Buckles, MD [facility] None  12/14/2023  3:05 PM [facility] DEVICE REMOTES [facility]  03/14/2024  3:05 PM [facility] DEVICE REMOTES [facility]       Kerry Hough, Paramedic [phone] [facility] Paramedic  09/24/23

## 2023-09-25 ENCOUNTER — Telehealth (HOSPITAL_COMMUNITY): Payer: Self-pay | Admitting: Licensed Clinical Social Worker

## 2023-09-25 NOTE — Telephone Encounter (Signed)
H&V Care Navigation CSW Progress Note  Clinical Social Worker informed by community paramedic that pt Sydney Shaffer case worker left and that pt could not find out who was managing her case or the status of it- CSW [employer] to inquire.  Patient is participating in a Managed Medicaid Plan:  Yes  SDOH Screenings   Food Insecurity: No Food Insecurity (07/02/2022)  Housing: Medium Risk (03/03/2023)  Transportation Needs: No Transportation Needs (07/02/2022)  Depression (PHQ2-9): Medium Risk (05/14/2023)  Financial Resource Strain: High Risk (07/30/2023)  Tobacco Use: Low Risk  (08/17/2023)    Burna Sis, LCSW Clinical Social Worker [facility] Desk#: [phone] Cell#: [phone]

## 2023-10-01 ENCOUNTER — Other Ambulatory Visit (HOSPITAL_COMMUNITY): Payer: Self-pay

## 2023-10-01 NOTE — Progress Notes (Signed)
Paramedicine Encounter    Patient ID: Sydney Shaffer, female    DOB: 29-Jan-1966, 57 y.o.   MRN: [id number]   Complaints-none   Edema-very slight   Compliance with meds-yes  Pill box filled-yes If so, by whom-paramedic   Refills needed-farxiga--needs PA -sent message to pharm team  She has 1 tablet left.     Pt reports she is doing good. She reports dizziness has been really good lately, no episodes of that. No increased sob, no c/p.  She has been weighing on carpeted floors and is going to find another hard surface to weigh on to get accurate readings. Her readings were almost 190lbs-that isnt right.  Meds verified and pill box refilled.   Will f/u next week.  Hopefully she can get her farxiga soon.    BP 112/78   Pulse 82   Resp 16   SpO2 98%  Weight yesterday-? 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   Anemia 03/29/2012    Current Outpatient Medications:    acetaminophen (TYLENOL) 500 MG tablet, Take 1,000 mg by mouth every 6 (six) hours as needed for moderate pain., Disp: , Rfl:    amiodarone (PACERONE) 200 MG tablet, Take 1/2 tablet (100 mg total) by mouth daily., Disp: 15 tablet, Rfl: 6   apixaban (ELIQUIS) 5 MG TABS tablet, Take 1 tablet (5 mg total) by mouth 2 (two) times daily., Disp: 60 tablet, Rfl: 6   carvedilol (COREG) 3.125 MG tablet, Take 1 tablet (3.125 mg total) by mouth 2 (two) times daily., Disp: 60 tablet, Rfl: 3   cyclobenzaprine (FLEXERIL) 10 MG tablet, Take 1 tablet (10 mg total) by mouth 3  (three) times daily as needed for muscle spasms., Disp: 20 tablet, Rfl: 0   dapagliflozin propanediol (FARXIGA) 10 MG TABS tablet, Take 1 tablet (10 mg total) by mouth daily. Needs appt for future refills, Disp: 30 tablet, Rfl: 6   digoxin (LANOXIN) 0.125 MG tablet, Take 1 tablet (0.125 mg total) by mouth daily., Disp: 90 tablet, Rfl: 3   doxylamine, Sleep, (UNISOM) 25 MG tablet, Take 25 mg by mouth at bedtime as needed for sleep. (Patient not taking: Reported on 08/17/2023), Disp: , Rfl:    ferrous sulfate 325 (65 FE) MG tablet, Take 325 mg by mouth daily with breakfast., Disp: , Rfl:    losartan (COZAAR) 25 MG tablet, Take 1/2 tablet (12.5 mg total) by mouth at bedtime., Disp: 15 tablet, Rfl: 3   Multiple Vitamin (MULTIVITAMIN WITH MINERALS) TABS, Take 1 tablet by mouth daily., Disp: , Rfl:    potassium chloride SA (KLOR-CON M) 20 MEQ tablet, Take 4 tablets (80 mEq total) by mouth every morning AND 4 tablets (80 mEq total) every evening., Disp: 240 tablet, Rfl: 11   spironolactone (ALDACTONE) 25 MG tablet, Take 1 tablet (25 mg total) by mouth daily. Hold if systolic blood pressure less than 100. (Patient taking differently: Take 25 mg by mouth at bedtime. Hold if systolic blood pressure less than 100.), Disp: 45 tablet, Rfl: 3   torsemide (DEMADEX) 20 MG tablet, TAKE 4 TABLETS BY MOUTH  IN THE MORNING AND 2 TABLETS EVERY EVENING, Disp: 545 tablet, Rfl: 2 No Known Allergies    Social History   Socioeconomic History   Marital status: Single    Spouse name: Not on file   Number of children: 3   Years of education: Not on file   Highest education level: Not on file  Occupational History   Occupation: receptionist    Employer: [employer]   Occupation: office cleaning  Tobacco Use   Smoking status: Never   Smokeless tobacco: Never  Vaping Use   Vaping status: Never Used  Substance and Sexual Activity   Alcohol use: Yes    Alcohol/week: 5.0 standard drinks of alcohol    Types: 5 Glasses of  wine per week    Comment: daily   Drug use: Never   Sexual activity: Yes    Birth control/protection: Condom  Other Topics Concern   Not on file  Social History Narrative   Not on file   Social Determinants of Health   Financial Resource Strain: High Risk (07/30/2023)   Overall Financial Resource Strain (CARDIA)    Difficulty of Paying Living Expenses: Very hard  Food Insecurity: No Food Insecurity (07/02/2022)   Hunger Vital Sign    Worried About Running Out of Food in the Last Year: Never true    Ran Out of Food in the Last Year: Never true  Transportation Needs: No Transportation Needs (07/02/2022)   PRAPARE - Administrator, Civil Service (Medical): No    Lack of Transportation (Non-Medical): No  Physical Activity: Not on file  Stress: Not on file  Social Connections: Not on file  Intimate Partner Violence: Not on file    Physical Exam      Future Appointments  Date Time Provider Department Center  10/26/2023  1:40 PM Bensimhon, Bevelyn Buckles, MD [facility] None  12/14/2023  3:05 PM [facility] DEVICE REMOTES [facility]  12/21/2023  3:00 PM Duke Salvia, MD [facility]  03/14/2024  3:05 PM [facility] DEVICE REMOTES [facility]       Kerry Hough, Paramedic [phone] [facility] Paramedic  10/01/23

## 2023-10-02 ENCOUNTER — Other Ambulatory Visit (HOSPITAL_COMMUNITY): Payer: Self-pay

## 2023-10-05 NOTE — Telephone Encounter (Signed)
CSW received message back from [facility] with new assigned case worker and ensuring they would reach out to client to discuss.  Information forwarded to [employer].  Burna Sis, LCSW Clinical Social Worker [facility] Desk#: [phone] Cell#: [phone]

## 2023-10-06 ENCOUNTER — Telehealth (HOSPITAL_COMMUNITY): Payer: Self-pay | Admitting: Cardiology

## 2023-10-06 NOTE — Telephone Encounter (Signed)
Patient called to request clearance for cardio pulmonary testing per disability/FMLA  Advised to have request sent via fax

## 2023-10-07 ENCOUNTER — Telehealth (HOSPITAL_COMMUNITY): Payer: Self-pay

## 2023-10-07 ENCOUNTER — Other Ambulatory Visit (HOSPITAL_COMMUNITY): Payer: Self-pay

## 2023-10-07 NOTE — Telephone Encounter (Signed)
[facility] Patient Advocate Encounter  Prior authorization for Marcelline Deist has been submitted and approved. Test billing returns $4 for 90 day supply.  [place] Effective: 10/07/2023 to 10/06/2024  Burnell Blanks, CPhT Rx Patient Advocate Phone: [phone]

## 2023-10-08 ENCOUNTER — Other Ambulatory Visit (HOSPITAL_COMMUNITY): Payer: Self-pay

## 2023-10-08 NOTE — Progress Notes (Signed)
Paramedicine Encounter    Patient ID: Sydney Shaffer, female    DOB: Sep 04, 1966, 57 y.o.   MRN: [id number]   Complaints-none   Edema-slight   Compliance with meds-yes  Pill box filled-yes  If so, by whom-paramedic   Refills needed-none   Pt reports she is doing pretty good. No issues with dizziness, no c/p, very slight edema to feet, no increased sob.   Went by to p/u meds from pharm-farxiga, eliquis and losartan Meds verified and pill box refilled.   She has received an eviction notice-court on Monday. I have advised her to get on with putting in applications so that process can go ahead and get started.  She is in the determination phase of disability.  Not really a plan for when she is told she has to leave other than putting some things in storage.     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   Anemia 03/29/2012    Current Outpatient Medications:    acetaminophen (TYLENOL) 500 MG tablet, Take 1,000 mg by mouth every 6 (six) hours as needed for moderate pain., Disp: , Rfl:    amiodarone (PACERONE) 200 MG tablet, Take 1/2 tablet (100 mg total) by mouth daily., Disp: 15 tablet, Rfl: 6   apixaban (ELIQUIS) 5 MG TABS tablet, Take 1 tablet (5 mg total) by mouth 2 (two) times daily., Disp: 60 tablet, Rfl: 6   carvedilol (COREG) 3.125 MG tablet, Take 1 tablet (3.125 mg total) by mouth 2 (two) times  daily., Disp: 60 tablet, Rfl: 3   dapagliflozin propanediol (FARXIGA) 10 MG TABS tablet, Take 1 tablet (10 mg total) by mouth daily. Needs appt for future refills, Disp: 30 tablet, Rfl: 6   digoxin (LANOXIN) 0.125 MG tablet, Take 1 tablet (0.125 mg total) by mouth daily., Disp: 90 tablet, Rfl: 3   losartan (COZAAR) 25 MG tablet, Take 1/2 tablet (12.5 mg total) by mouth at bedtime., Disp: 15 tablet, Rfl: 3   Multiple Vitamin (MULTIVITAMIN WITH MINERALS) TABS, Take 1 tablet by mouth daily., Disp: , Rfl:    potassium chloride SA (KLOR-CON M) 20 MEQ tablet, Take 4 tablets (80 mEq total) by mouth every morning AND 4 tablets (80 mEq total) every evening., Disp: 240 tablet, Rfl: 11   spironolactone (ALDACTONE) 25 MG tablet, Take 1 tablet (25 mg total) by mouth daily. Hold if systolic blood pressure less than 100. (Patient taking differently: Take 25 mg by mouth at bedtime. Hold if systolic blood pressure less than 100.), Disp: 45 tablet, Rfl: 3   torsemide (DEMADEX) 20 MG tablet, TAKE 4 TABLETS BY MOUTH IN THE MORNING AND 2 TABLETS EVERY EVENING, Disp: 545 tablet, Rfl: 2   cyclobenzaprine (FLEXERIL) 10 MG tablet, Take 1 tablet (10 mg total) by mouth 3 (three) times daily as needed for muscle spasms., Disp: 20 tablet, Rfl: 0   doxylamine, Sleep, (UNISOM) 25 MG tablet, Take 25  mg by mouth at bedtime as needed for sleep. (Patient not taking: Reported on 08/17/2023), Disp: , Rfl:    ferrous sulfate 325 (65 FE) MG tablet, Take 325 mg by mouth daily with breakfast., Disp: , Rfl:  No Known Allergies    Social History   Socioeconomic History   Marital status: Single    Spouse name: Not on file   Number of children: 3   Years of education: Not on file   Highest education level: Not on file  Occupational History   Occupation: receptionist    Employer: [employer]   Occupation: office cleaning  Tobacco Use   Smoking status: Never   Smokeless tobacco: Never  Vaping Use   Vaping status: Never Used   Substance and Sexual Activity   Alcohol use: Yes    Alcohol/week: 5.0 standard drinks of alcohol    Types: 5 Glasses of wine per week    Comment: daily   Drug use: Never   Sexual activity: Yes    Birth control/protection: Condom  Other Topics Concern   Not on file  Social History Narrative   Not on file   Social Determinants of Health   Financial Resource Strain: High Risk (07/30/2023)   Overall Financial Resource Strain (CARDIA)    Difficulty of Paying Living Expenses: Very hard  Food Insecurity: No Food Insecurity (07/02/2022)   Hunger Vital Sign    Worried About Running Out of Food in the Last Year: Never true    Ran Out of Food in the Last Year: Never true  Transportation Needs: No Transportation Needs (07/02/2022)   PRAPARE - Administrator, Civil Service (Medical): No    Lack of Transportation (Non-Medical): No  Physical Activity: Not on file  Stress: Not on file  Social Connections: Not on file  Intimate Partner Violence: Not on file    Physical Exam      Future Appointments  Date Time Provider Department Center  10/26/2023  1:40 PM Bensimhon, Bevelyn Buckles, MD [facility] None  12/14/2023  3:05 PM [facility] DEVICE REMOTES [facility]  12/21/2023  3:00 PM Duke Salvia, MD [facility]  03/14/2024  3:05 PM [facility] DEVICE REMOTES [facility]       Kerry Hough, Paramedic [phone] [facility] Paramedic  10/08/23

## 2023-10-08 NOTE — Telephone Encounter (Signed)
Sydney Shaffer with disability Called to to request medical/cardiac clearance for PFT AND DLCO -CLEARANCE NEEDED BEFORE TEST IS PERFORMED  FAX# [phone] Sydney Shaffer # [phone]

## 2023-10-09 ENCOUNTER — Telehealth (HOSPITAL_COMMUNITY): Payer: Self-pay | Admitting: Licensed Clinical Social Worker

## 2023-10-09 NOTE — Telephone Encounter (Signed)
H&V Care Navigation CSW Progress Note  Clinical Social Worker received call from pt requesting assistance with housing application fees.  Patient still only making $400/month in income and has court day Monday for eviction from current apartment.  Discussed how patient planned to afford alternative housing given that income- patient has no plan at this time just knows she needs to be out of her current apartment though not sure when.  CSW and patient discussed that income based housing would be the only option for her currently and that waitlist for those options are quite long but usually don't include application fees- CSW sent list of options under $400 to patient for review and follow up.   Discussed again need for her to consider moving in with relatives.  Pt stated that some family members had discussed them moving in with patient but now she is evicted they are looking for alternative places- encouraged her to discuss combining incomes with them to find a location as that might open up more options for housing that is available now.  Reported if they needed help with application fees for these types of apartments we could discuss further.  CSW sent referral to [facility] eviction medication program through [facility]- hopefully they can assist in delaying eviction.  Patient will look into options provided by CSW and will reach out for further assistance as needed.  Patient is participating in a Managed Medicaid Plan:  Yes  SDOH Screenings   Food Insecurity: No Food Insecurity (07/02/2022)  Housing: Medium Risk (10/09/2023)  Transportation Needs: No Transportation Needs (07/02/2022)  Depression (PHQ2-9): Medium Risk (05/14/2023)  Financial Resource Strain: High Risk (10/09/2023)  Tobacco Use: Low Risk  (08/17/2023)   Burna Sis, LCSW Clinical Social Worker [facility] Desk#: [phone] Cell#: [phone]

## 2023-10-14 ENCOUNTER — Telehealth (HOSPITAL_COMMUNITY): Payer: Self-pay | Admitting: Licensed Clinical Social Worker

## 2023-10-14 NOTE — Telephone Encounter (Signed)
H&V Care Navigation CSW Progress Note  Clinical Social Worker received call from pt requesting help with disability case.  States her work says they are missing documents from cardiology in order to proceed.  CSW reached out to case worker:  Cala Bradford [phone]  Unable to reach- left VM requesting return call.  Patient is participating in a Managed Medicaid Plan:  Yes  SDOH Screenings   Food Insecurity: No Food Insecurity (07/02/2022)  Housing: Medium Risk (10/09/2023)  Transportation Needs: No Transportation Needs (07/02/2022)  Depression (PHQ2-9): Medium Risk (05/14/2023)  Financial Resource Strain: High Risk (10/09/2023)  Tobacco Use: Low Risk  (08/17/2023)

## 2023-10-22 ENCOUNTER — Encounter (HOSPITAL_COMMUNITY): Payer: Self-pay | Admitting: *Deleted

## 2023-10-22 ENCOUNTER — Other Ambulatory Visit (HOSPITAL_COMMUNITY): Payer: Self-pay

## 2023-10-22 NOTE — Telephone Encounter (Signed)
Letter faxed to Kim at [phone] for clearance for CPX test

## 2023-10-22 NOTE — Progress Notes (Signed)
Paramedicine Encounter    Patient ID: Sydney Shaffer, female    DOB: 1966/02/10, 57 y.o.   MRN: [id number]   Complaints-none   Edema-none   Compliance with meds-yes   Pill box filled-yes If so, by whom-paramedic   Refills needed- -potassium  -torsemide -spiro -carvedilol   Pt reports she is doing ok. She denies increased sob, no dizziness, no c/p.  Pt reports no changes from last clinic visit.  She sees provider in office next Monday. Things seem to be stable symptom wise.  She is in the process of dealing with eviction. So she is packing things up.  She has an order with the court to extend her eviction and now has to make the rent payments to the court instead of landlord. If any of those payments are not full or late, then she will have a 10 day notice. Disability wants her to do another stress test for their process. They will not take the one she had done previously this year.  Possibly will be moving in with one of her kids if she can't get disability or apt before evicted.  I have told her to go ahead and put in applications at various places, and I think she has to a few apts to get on their wait list.  Meds verified and pill box refilled.     BP 108/68   Pulse 76   Resp 16   SpO2 97%  Weight yesterday-? 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   Anemia 03/29/2012    Current Outpatient Medications:    acetaminophen (TYLENOL) 500 MG tablet, Take 1,000 mg by mouth every 6 (six) hours as needed for moderate  pain., Disp: , Rfl:    amiodarone (PACERONE) 200 MG tablet, Take 1/2 tablet (100 mg total) by mouth daily., Disp: 15 tablet, Rfl: 6   apixaban (ELIQUIS) 5 MG TABS tablet, Take 1 tablet (5 mg total) by mouth 2 (two) times daily., Disp: 60 tablet, Rfl: 6   carvedilol (COREG) 3.125 MG tablet, Take 1 tablet (3.125 mg total) by mouth 2 (two) times daily., Disp: 60 tablet, Rfl: 3   dapagliflozin propanediol (FARXIGA) 10 MG TABS tablet, Take 1 tablet (10 mg total) by mouth daily. Needs appt for future refills, Disp: 30 tablet, Rfl: 6   digoxin (LANOXIN) 0.125 MG tablet, Take 1 tablet (0.125 mg total) by mouth daily., Disp: 90 tablet, Rfl: 3   ferrous sulfate 325 (65 FE) MG tablet, Take 325 mg by mouth daily with breakfast., Disp: , Rfl:    losartan (COZAAR) 25 MG tablet, Take 1/2 tablet (12.5 mg total) by mouth at bedtime., Disp: 15 tablet, Rfl: 3   Multiple Vitamin (MULTIVITAMIN WITH MINERALS) TABS, Take 1 tablet by mouth daily., Disp: , Rfl:    potassium chloride SA (KLOR-CON M) 20 MEQ tablet, Take 4 tablets (80 mEq total) by mouth every morning AND 4 tablets (80 mEq total) every evening., Disp: 240 tablet, Rfl: 11   spironolactone (ALDACTONE) 25 MG tablet, Take 1 tablet (25 mg total) by mouth daily. Hold if systolic blood pressure  less than 100. (Patient taking differently: Take 25 mg by mouth at bedtime. Hold if systolic blood pressure less than 100.), Disp: 45 tablet, Rfl: 3   torsemide (DEMADEX) 20 MG tablet, TAKE 4 TABLETS BY MOUTH IN THE MORNING AND 2 TABLETS EVERY EVENING, Disp: 545 tablet, Rfl: 2   cyclobenzaprine (FLEXERIL) 10 MG tablet, Take 1 tablet (10 mg total) by mouth 3 (three) times daily as needed for muscle spasms. (Patient not taking: Reported on 10/26/2023), Disp: 20 tablet, Rfl: 0   doxylamine, Sleep, (UNISOM) 25 MG tablet, Take 25 mg by mouth at bedtime as needed for sleep. (Patient not taking: Reported on 10/26/2023), Disp: , Rfl:  No Known Allergies    Social History    Socioeconomic History   Marital status: Single    Spouse name: Not on file   Number of children: 3   Years of education: Not on file   Highest education level: Not on file  Occupational History   Occupation: receptionist    Employer: [employer]   Occupation: office cleaning  Tobacco Use   Smoking status: Never   Smokeless tobacco: Never  Vaping Use   Vaping status: Never Used  Substance and Sexual Activity   Alcohol use: Yes    Alcohol/week: 5.0 standard drinks of alcohol    Types: 5 Glasses of wine per week    Comment: daily   Drug use: Never   Sexual activity: Yes    Birth control/protection: Condom  Other Topics Concern   Not on file  Social History Narrative   Not on file   Social Determinants of Health   Financial Resource Strain: High Risk (10/09/2023)   Overall Financial Resource Strain (CARDIA)    Difficulty of Paying Living Expenses: Very hard  Food Insecurity: No Food Insecurity (07/02/2022)   Hunger Vital Sign    Worried About Running Out of Food in the Last Year: Never true    Ran Out of Food in the Last Year: Never true  Transportation Needs: No Transportation Needs (07/02/2022)   PRAPARE - Administrator, Civil Service (Medical): No    Lack of Transportation (Non-Medical): No  Physical Activity: Not on file  Stress: Not on file  Social Connections: Not on file  Intimate Partner Violence: Not on file    Physical Exam      Future Appointments  Date Time Provider Department Center  10/26/2023  1:40 PM Bensimhon, Bevelyn Buckles, MD [facility] None  12/14/2023  3:05 PM [facility] DEVICE REMOTES [facility]  12/21/2023  3:00 PM Duke Salvia, MD [facility]  03/14/2024  3:05 PM [facility] DEVICE REMOTES [facility]       Kerry Hough, Paramedic [phone] [facility] Paramedic  10/26/23

## 2023-10-22 NOTE — Telephone Encounter (Signed)
Per Sydney Shaffer patient needs to have a CPX test done through their office for disability determination, and clearance is needed.  Per Dr Gala Romney ok to proceed with CPX testing, note faxed to [facility]

## 2023-10-26 ENCOUNTER — Encounter (HOSPITAL_COMMUNITY): Payer: BC Managed Care – PPO | Admitting: Internal Medicine

## 2023-10-27 ENCOUNTER — Encounter (HOSPITAL_COMMUNITY): Payer: Self-pay | Admitting: Internal Medicine

## 2023-10-27 ENCOUNTER — Inpatient Hospital Stay (HOSPITAL_COMMUNITY)
Admission: RE | Admit: 2023-10-27 | Discharge: 2023-10-27 | Discharge status: Home / Self Care | Payer: Self-pay | Source: Ambulatory Visit | Attending: Internal Medicine | Admitting: Internal Medicine

## 2023-10-27 VITALS — BP 124/88 | HR 95 | Wt 188.2 lb

## 2023-10-27 DIAGNOSIS — I472 Ventricular tachycardia, unspecified: Secondary | ICD-10-CM

## 2023-10-27 DIAGNOSIS — I5022 Chronic systolic (congestive) heart failure: Secondary | ICD-10-CM | POA: Diagnosis not present

## 2023-10-27 LAB — BASIC METABOLIC PANEL
Anion gap: 7 (ref 5–15)
BUN: 16 mg/dL (ref 6–20)
CO2: 27 mmol/L (ref 22–32)
Calcium: 9.6 mg/dL (ref 8.9–10.3)
Chloride: 102 mmol/L (ref 98–111)
Creatinine, Ser: 1.08 mg/dL — ABNORMAL HIGH (ref 0.44–1.00)
GFR, Estimated: 60 mL/min — ABNORMAL LOW (ref >60)
Glucose, Bld: 90 mg/dL (ref 70–99)
Potassium: 3.8 mmol/L (ref 3.5–5.1)
Sodium: 136 mmol/L (ref 135–145)

## 2023-10-27 LAB — DIGOXIN LEVEL: Digoxin Level: 0.7 ng/mL — ABNORMAL LOW (ref 0.8–2.0)

## 2023-10-27 LAB — BRAIN NATRIURETIC PEPTIDE: B Natriuretic Peptide: 507.9 pg/mL — ABNORMAL HIGH (ref 0.0–100.0)

## 2023-10-27 MED ORDER — LOSARTAN POTASSIUM 25 MG PO TABS: 25.0000 mg | ORAL_TABLET | Freq: Every day | ORAL | 3 refills | Status: DC

## 2023-10-27 NOTE — Progress Notes (Addendum)
Advanced Heart Failure Clinic Note   PCP: Farley Ly, PA-C ([facility]) EP: Dr Graciela Husbands Medtronic ICD HF Cardiologist: Dr. Gala Romney  HPI: Sydney Shaffer is a 57 y.o.female with chronic systolic HF due to NICM s/p Medtronic ICD, HTN, and SVT s/p 2/15 ablation.  Cath 4/13 and 10/17 normal coronaries.  Lost to follow up until 5/19. Echo 6/19 EF 30-35% mild MR. S/p ICD gen change 12/20.  Reestablished with HF clinic in April 2023.    Echo 4/23: EF 25-30%, LV severely dilated, grade III DD, RV okay, RVSP 49 mmHg, moderate MR, mild to moderate TR   Admitted 7/23 with ADHF after being out of meds. Referred for paramedicine.  Amiodarone increased d/t episodes of VT (monitor only) and runs SVT and AF.   CPX 4/24  FVC 1.67 (52%)      FEV1 1.37 (54%)        FEV1/FVC 82 (103%)        MVV 50 (49%)      BP rest: 88/68 ->  BP peak: 122/68  pVO2: 12.4 (55% predicted peak VO2) VE/VCO2 slope:  31  Peak RER: 1.12  VE/MVV:  79% (VE/FEV1*40 = 72%)   Echo  03/02/23 EF 20-25% RV mild HK   R/L cath 07/01/23 Ao = 89/68 (80) LV = 105/28 RA = 4 RV = 47/6 PA = 46/20 (31) PCW = 19 (v = 25-30) Fick cardiac output/index =4.5/2.3 Thermo CP/CI = 3.9/2.0 PVR = 2.6 WU Ao sat = 91% PA sat = 62%, 64%  Returns for f/u. Overall doing ok. Following closely.with Paramedicine Florentina Addison). Feeling pretty good. Trying to find a new place to live. Able to do ADLs without problem but SOB easily if she does more. Minimal ankle swelling    Cardiac Studies:  - Echo (4/23): EF 25-30% - Echo (10/17): EF 25-30%, Grade 1 DD - Echo (6/16): EF 30-35% mild MR - Echo (2/15): EF 20-25%  - Echo (7/13): EF 20-25%   R/LHC (10/17): normal cors  CPX (5/18)  Peak VO2: 12.3 (61% predicted peak VO2) VE/VCO2 slope:  25 Peak RER: 0.98  CPX (6/15):  Peak VO2: 12.6 (57.8% predicted peak VO2) VE/VCO2 slope: 28.6 Peak RER: 1.01   SH: She is not a smoker. She does not drink alcohol. Lives alone. She has 3 grown children    FH: Mom breast cancer. Father cancer.   ROS: All systems negative except as listed in HPI, PMH and Problem List.  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    Chronic systolic heart failure (HCC)    Dysrhythmia    Bradycardia   Hypertension    Hypertensive heart disease with CHF (HCC)    ICD (implantable cardiac defibrillator) in place 08/13/2012   Menorrhagia    Metrorrhagia 06/04/2015   SVT (supraventricular tachycardia) (HCC) 03/30/2014   VT (ventricular tachycardia) (HCC) 07/22/2014   Current Outpatient Medications  Medication Sig Dispense Refill   acetaminophen (TYLENOL) 500 MG tablet Take 1,000 mg by mouth every 6 (six) hours as needed for moderate pain.     amiodarone (  PACERONE) 200 MG tablet Take 1/2 tablet (100 mg total) by mouth daily. 15 tablet 6   apixaban (ELIQUIS) 5 MG TABS tablet Take 1 tablet (5 mg total) by mouth 2 (two) times daily. 60 tablet 6   carvedilol (COREG) 3.125 MG tablet Take 1 tablet (3.125 mg total) by mouth 2 (two) times daily. 60 tablet 3   cyclobenzaprine (FLEXERIL) 10 MG tablet Take 1 tablet (10 mg total) by mouth 3 (three) times daily as needed for muscle spasms. 20 tablet 0   dapagliflozin propanediol (FARXIGA) 10 MG TABS tablet Take 1 tablet (10 mg total) by mouth daily. Needs appt for future refills 30 tablet 6   digoxin (LANOXIN) 0.125 MG tablet Take 1 tablet (0.125 mg total) by mouth daily. 90 tablet 3   doxylamine, Sleep, (UNISOM) 25 MG tablet Take 25 mg by mouth at bedtime as needed for sleep.     ferrous sulfate 325 (65 FE) MG tablet Take 325 mg by mouth daily with breakfast.      losartan (COZAAR) 25 MG tablet Take 1/2 tablet (12.5 mg total) by mouth at bedtime. 15 tablet 3   Multiple Vitamin (MULTIVITAMIN WITH MINERALS) TABS Take 1 tablet by mouth daily.     potassium chloride SA (KLOR-CON M) 20 MEQ tablet Take 4 tablets (80 mEq total) by mouth every morning AND 4 tablets (80 mEq total) every evening. 240 tablet 11   spironolactone (ALDACTONE) 25 MG tablet Take 1 tablet (25 mg total) by mouth daily. Hold if systolic blood pressure less than 100. (Patient taking differently: Take 25 mg by mouth at bedtime. Hold if systolic blood pressure less than 100.) 45 tablet 3   torsemide (DEMADEX) 20 MG tablet TAKE 4 TABLETS BY MOUTH IN THE MORNING AND 2 TABLETS EVERY EVENING 545 tablet 2   No current facility-administered medications for this encounter.   BP 124/88   Pulse 95   Wt 85.4 kg (188 lb 3.2 oz)   SpO2 95%   BMI 28.62 kg/m   Wt Readings from Last 3 Encounters:  10/27/23 85.4 kg (188 lb 3.2 oz)  10/08/23 79.8 kg (176 lb)  09/24/23 79.8 kg (176 lb)   Vitals:   10/27/23 1453  BP: 124/88  Pulse: 95  SpO2: 95%  Weight: 85.4 kg (188 lb 3.2 oz)    PHYSICAL EXAM: General:  Well appearing. No resp difficulty HEENT: normal Neck: supple. no JVD. Carotids 2+ bilat; no bruits. No lymphadenopathy or thryomegaly appreciated. Cor: PMI nondisplaced. Regular rate & rhythm. No rubs, gallops or murmurs. Lungs: clear Abdomen: soft, nontender, nondistended. No hepatosplenomegaly. No bruits or masses. Good bowel sounds. Extremities: no cyanosis, clubbing, rash, edema Neuro: alert & orientedx3, cranial nerves grossly intact. moves all 4 extremities w/o difficulty. Affect pleasant  Device interrogation: No VT/AF. Optivol ok. Personally reviewed  ASSESSMENT & PLAN:  1. Chronic Systolic Heart Failure:  - Normal cors by cath 2013. Nonischemic cardiomyopathy s/p Medtronic ICD.  - Echo (6/16): EF 30-35%. - Echo (10/17): EF 25-30%.   - Echo (6/19): EF 30-35%. - Echo (4/23): EF  25-30% - Echo 03/02/23 EF 20-25% RV mild HK Personally reviewed -> echo concerning for LVNC will need cMRI - ICD generator change 11/14/19 - CPX 4/24 FVC 1.67 (52%) FEV1 1.37 (54%) FEV1/FVC 82 (103%) BP rest: 88/68 ->  BP peak: 122/68 pVO2: 12.4 (55% predicted peak VO2) VE/VCO2 31 pRER: 1.12 VE/MVV:  79% - R/L cath 07/01/23 normal cors RA 4 PA 46/20 (31) PCW 19 (v = 25-30)  Fick 4.5/2.3 Thermo 3.9/2.0 PVR 2.6 WU - Improved NYHA III - Volume status looks good on exam and ICD Continue torsemide 80/40 - Increase losartan 12.5 at bedtime -> 25 qhs - Continue spiro 25 mg daily - Continue Coreg to 3.125 mg bid (dose decreased due to intolerance) - Continue Farxiga 10 mg daily - Continue digoxin 0.125 daily  - Continue Eliquis for possible LVNC  - Blood Type O - She is interested in advanced therapies (particularly transplant) but is still a bit early a bit early. Will continue to follow closely                                                                                            -  2. SVT and VT  - s/p atrial tachycardia ablation in 01/2014.   - No VT on ICD interrogation today - Continue amio 100 daily - Follows with Dr. Graciela Husbands  3. SDOH - Compliance has improved greatly  with Paramedicine program. Appreciate assistance. - Continue support  Arvilla Meres, MD 10/27/2023

## 2023-10-27 NOTE — Patient Instructions (Signed)
Medication Changes:  INCREASE LOSARTAN TO 25MG  DAILY   Lab Work:  Labs done today, your results will be available in MyChart, we will contact you for abnormal readings.  Follow-Up in: 3 MONTHS WITH DR. Gala Romney  WITH ECHO AS WELL PLEASE CALL OUR OFFICE AROUND DECEMBER TO GET SCHEDULED FOR YOUR APPOINTMENT. PHONE NUMBER IS [phone] OPTION 2   At the [facility], you and your health needs are our priority. We have a designated team specialized in the treatment of Heart Failure. This Care Team includes your primary Heart Failure Specialized Cardiologist (physician), Advanced Practice Providers (APPs- Physician Assistants and Nurse Practitioners), and Pharmacist who all work together to provide you with the care you need, when you need it.   You may see any of the following providers on your designated Care Team at your next follow up:  Dr. Arvilla Meres Dr. Marca Ancona Dr. Dorthula Nettles Dr. Theresia Bough Tonye Becket, NP Robbie Lis, [place] [facility] [place], [place] Brynda Peon, NP [place] Lee, NP Karle Plumber, PharmD   Please be sure to bring in all your medications bottles to every appointment.   Need to Contact us:  If you have any questions or concerns before your next appointment please send [place] a message through [place] or call our office at [phone].    TO LEAVE A MESSAGE FOR THE NURSE SELECT OPTION 2, PLEASE LEAVE A MESSAGE INCLUDING: YOUR NAME DATE OF BIRTH CALL BACK NUMBER REASON FOR CALL**this is important as we prioritize the call backs  YOU WILL RECEIVE A CALL BACK THE SAME DAY AS LONG AS YOU CALL BEFORE 4:00 PM

## 2023-10-29 ENCOUNTER — Other Ambulatory Visit (HOSPITAL_COMMUNITY): Payer: Self-pay

## 2023-10-29 NOTE — Progress Notes (Signed)
Paramedicine Encounter    Patient ID: Sydney Shaffer, female    DOB: [birth year], 57 y.o.   MRN: [id number]   Complaints-none   Edema-none  Compliance with meds-yes  Pill box filled-yes  If so, by whom-paramedic   Refills needed- Carvedilol-she has thurs pm to sun am   Spiro-she has it thru thurs pm to sun pm  Losartan-needs new dose filled   She was recently seen in clinic the other day. Her losartan was increased to 25mg  daily at bedtime.  Meds verified and pill box refilled.   Per pharmacy she is still showing to have both insurances, which I know for a fact she has called multiple times in my presence and they told her it would be handled in 24hrs and it has not yet.  So she is going to call the insurance again and medicaid to have them override it and call pharmacy so they can fill. Int he meantime pharmacy is able to give her a few days worth.   Meds verified and pill box refilled and labeled the days that needs the carvedilol and spiro.    BP 100/82   Pulse 78   Resp 16   Wt 178 lb (80.7 kg)   SpO2 96%   BMI 27.06 kg/m  Weight yesterday-? 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   Anemia 03/29/2012    Current Outpatient Medications:    acetaminophen (TYLENOL) 500 MG tablet, Take 1,000 mg by mouth every 6 (six) hours as needed for moderate pain., Disp: , Rfl:    amiodarone (PACERONE) 200 MG tablet, Take 1/2 tablet (100 mg total) by mouth daily., Disp: 15 tablet, Rfl: 6   apixaban (ELIQUIS) 5  MG TABS tablet, Take 1 tablet (5 mg total) by mouth 2 (two) times daily., Disp: 60 tablet, Rfl: 6   carvedilol (COREG) 3.125 MG tablet, Take 1 tablet (3.125 mg total) by mouth 2 (two) times daily., Disp: 60 tablet, Rfl: 3   dapagliflozin propanediol (FARXIGA) 10 MG TABS tablet, Take 1 tablet (10 mg total) by mouth daily. Needs appt for future refills, Disp: 30 tablet, Rfl: 6   digoxin (LANOXIN) 0.125 MG tablet, Take 1 tablet (0.125 mg total) by mouth daily., Disp: 90 tablet, Rfl: 3   ferrous sulfate 325 (65 FE) MG tablet, Take 325 mg by mouth daily with breakfast., Disp: , Rfl:    losartan (COZAAR) 25 MG tablet, Take 1 tablet (25 mg total) by mouth daily., Disp: 30 tablet, Rfl: 3   Multiple Vitamin (MULTIVITAMIN WITH MINERALS) TABS, Take 1 tablet by mouth daily., Disp: , Rfl:    potassium chloride SA (KLOR-CON M) 20 MEQ tablet, Take 4 tablets (80 mEq total) by mouth every morning AND 4 tablets (80 mEq total) every evening., Disp: 240 tablet, Rfl: 11   spironolactone (ALDACTONE) 25 MG tablet, Take 1 tablet (25 mg total) by mouth daily. Hold if systolic blood pressure less than 100. (Patient taking differently: Take 25 mg by mouth at bedtime. Hold if systolic blood pressure less than 100.), Disp: 45 tablet, Rfl: 3  torsemide (DEMADEX) 20 MG tablet, TAKE 4 TABLETS BY MOUTH IN THE MORNING AND 2 TABLETS EVERY EVENING, Disp: 545 tablet, Rfl: 2   cyclobenzaprine (FLEXERIL) 10 MG tablet, Take 1 tablet (10 mg total) by mouth 3 (three) times daily as needed for muscle spasms., Disp: 20 tablet, Rfl: 0   doxylamine, Sleep, (UNISOM) 25 MG tablet, Take 25 mg by mouth at bedtime as needed for sleep., Disp: , Rfl:  No Known Allergies    Social History   Socioeconomic History   Marital status: Single    Spouse name: Not on file   Number of children: 3   Years of education: Not on file   Highest education level: Not on file  Occupational History   Occupation: receptionist    Employer: [employer]    Occupation: office cleaning  Tobacco Use   Smoking status: Never   Smokeless tobacco: Never  Vaping Use   Vaping status: Never Used  Substance and Sexual Activity   Alcohol use: Yes    Alcohol/week: 5.0 standard drinks of alcohol    Types: 5 Glasses of wine per week    Comment: daily   Drug use: Never   Sexual activity: Yes    Birth control/protection: Condom  Other Topics Concern   Not on file  Social History Narrative   Not on file   Social Determinants of Health   Financial Resource Strain: High Risk (10/09/2023)   Overall Financial Resource Strain (CARDIA)    Difficulty of Paying Living Expenses: Very hard  Food Insecurity: No Food Insecurity (07/02/2022)   Hunger Vital Sign    Worried About Running Out of Food in the Last Year: Never true    Ran Out of Food in the Last Year: Never true  Transportation Needs: No Transportation Needs (07/02/2022)   PRAPARE - Administrator, Civil Service (Medical): No    Lack of Transportation (Non-Medical): No  Physical Activity: Not on file  Stress: Not on file  Social Connections: Not on file  Intimate Partner Violence: Not on file    Physical Exam      Future Appointments  Date Time Provider Department Center  12/14/2023  3:05 PM [facility] DEVICE REMOTES [facility]  12/21/2023  3:00 PM Duke Salvia, MD [facility]  03/14/2024  3:05 PM [facility] DEVICE REMOTES [facility]       Kerry Hough, Paramedic [phone] [facility] Paramedic  10/29/23

## 2023-10-30 ENCOUNTER — Telehealth (HOSPITAL_COMMUNITY): Payer: Self-pay | Admitting: Licensed Clinical Social Worker

## 2023-11-03 ENCOUNTER — Other Ambulatory Visit (HOSPITAL_COMMUNITY): Payer: Self-pay | Admitting: Internal Medicine

## 2023-11-04 ENCOUNTER — Other Ambulatory Visit (HOSPITAL_COMMUNITY): Payer: Self-pay

## 2023-11-04 NOTE — Progress Notes (Signed)
Paramedicine Encounter    Patient ID: Sydney Shaffer, female    DOB: 1966-11-17, 57 y.o.   MRN: [id number]   Complaints-stressed  Edema-none   Compliance with meds-yes-usually but she missed 2 am and 2 pm doses this week   Pill box filled-yes  If so, by whom-paramedic   Refills needed- Carvedilol  Pt reports she is doing ok. She is stressed right now with the living situation but doing ok. Pt denies increased sob, she reports she had c/p a few days ago while at rest but it subsided after about 10 min.  She was upset that day and crying about the situation, same thing happened that night.  She did get heavy breathing and sweaty at that time, I told her if it happened again to not wait to call 911.   Meds verified and pill box refilled.  Needs carvedilol in pill box- pharm did not give to her.  She said she is going by on her way out and will see if its ready.  It will need to be placed in pill box.   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   Anemia 03/29/2012    Current Outpatient Medications:    acetaminophen (TYLENOL) 500 MG tablet, Take 1,000 mg by mouth every 6 (six) hours as needed for moderate pain., Disp: , Rfl:    amiodarone (PACERONE) 200 MG tablet, TAKE 1/2 TABLET ( 100 MG ) BY MOUTH DAILIY, Disp: 45 tablet, Rfl: 3   apixaban (ELIQUIS) 5 MG TABS tablet, Take 1 tablet (5 mg total) by mouth 2 (two) times daily.,  Disp: 60 tablet, Rfl: 6   carvedilol (COREG) 3.125 MG tablet, Take 1 tablet (3.125 mg total) by mouth 2 (two) times daily., Disp: 60 tablet, Rfl: 3   dapagliflozin propanediol (FARXIGA) 10 MG TABS tablet, Take 1 tablet (10 mg total) by mouth daily. Needs appt for future refills, Disp: 30 tablet, Rfl: 6   digoxin (LANOXIN) 0.125 MG tablet, Take 1 tablet (0.125 mg total) by mouth daily., Disp: 90 tablet, Rfl: 3   losartan (COZAAR) 25 MG tablet, Take 1 tablet (25 mg total) by mouth daily., Disp: 30 tablet, Rfl: 3   Multiple Vitamin (MULTIVITAMIN WITH MINERALS) TABS, Take 1 tablet by mouth daily., Disp: , Rfl:    potassium chloride SA (KLOR-CON M) 20 MEQ tablet, Take 4 tablets (80 mEq total) by mouth every morning AND 4 tablets (80 mEq total) every evening., Disp: 240 tablet, Rfl: 11   spironolactone (ALDACTONE) 25 MG tablet, Take 1 tablet (25 mg total) by mouth daily. Hold if systolic blood pressure less than 100. (Patient taking differently: Take 25 mg by mouth at bedtime. Hold if systolic blood pressure less than 100.), Disp: 45 tablet, Rfl: 3   torsemide (DEMADEX) 20 MG tablet, TAKE 4 TABLETS BY MOUTH IN THE MORNING AND 2 TABLETS EVERY EVENING, Disp: 545 tablet, Rfl: 2   cyclobenzaprine (FLEXERIL) 10 MG tablet, Take  1 tablet (10 mg total) by mouth 3 (three) times daily as needed for muscle spasms., Disp: 20 tablet, Rfl: 0   doxylamine, Sleep, (UNISOM) 25 MG tablet, Take 25 mg by mouth at bedtime as needed for sleep., Disp: , Rfl:    ferrous sulfate 325 (65 FE) MG tablet, Take 325 mg by mouth daily with breakfast., Disp: , Rfl:  No Known Allergies    Social History   Socioeconomic History   Marital status: Single    Spouse name: Not on file   Number of children: 3   Years of education: Not on file   Highest education level: Not on file  Occupational History   Occupation: Database administrator: [employer]   Occupation: office cleaning  Tobacco Use   Smoking status: Never   Smokeless  tobacco: Never  Vaping Use   Vaping status: Never Used  Substance and Sexual Activity   Alcohol use: Yes    Alcohol/week: 5.0 standard drinks of alcohol    Types: 5 Glasses of wine per week    Comment: daily   Drug use: Never   Sexual activity: Yes    Birth control/protection: Condom  Other Topics Concern   Not on file  Social History Narrative   Not on file   Social Determinants of Health   Financial Resource Strain: High Risk (10/09/2023)   Overall Financial Resource Strain (CARDIA)    Difficulty of Paying Living Expenses: Very hard  Food Insecurity: No Food Insecurity (07/02/2022)   Hunger Vital Sign    Worried About Running Out of Food in the Last Year: Never true    Ran Out of Food in the Last Year: Never true  Transportation Needs: No Transportation Needs (07/02/2022)   PRAPARE - Administrator, Civil Service (Medical): No    Lack of Transportation (Non-Medical): No  Physical Activity: Not on file  Stress: Not on file  Social Connections: Not on file  Intimate Partner Violence: Not on file    Physical Exam      Future Appointments  Date Time Provider Department Center  12/14/2023  3:05 PM [facility]  12/21/2023  3:00 PM Duke Salvia, MD [facility]  03/14/2024  3:05 PM [facility] DEVICE [facility]OTES [facility]       Kerry Hough, Paramedic [phone] [facility] Paramedic  11/04/23

## 2023-11-09 NOTE — Telephone Encounter (Signed)
Patient called in regarding paperwork for assistance- asking CSW for help sending in- CSW informed pt she should gather paperwork and reach out when she has everything together and I can help fax in.  Burna Sis, LCSW Clinical Social Worker [facility] Desk#: [phone] Cell#: [phone]

## 2023-11-11 ENCOUNTER — Encounter: Payer: Self-pay | Admitting: Internal Medicine

## 2023-11-12 ENCOUNTER — Other Ambulatory Visit (HOSPITAL_COMMUNITY): Payer: Self-pay

## 2023-11-12 NOTE — Progress Notes (Signed)
Paramedicine Encounter    Patient ID: Sydney Shaffer, female    DOB: Sep 15, 1966, 57 y.o.   MRN: [id number]   Complaints-stressed   Edema-none   Compliance with meds-yes   Pill box filled-yes If so, by whom-paramedic   Refills needed-none   Pt reports just being stressed right now dealing with the double insurance still and the housing situation. Other than that she reports feeling good. No dizziness, no sob, no edema noted.   Her weight is showing to be down from last visit. But she doesn't know if the scales are right.  Meds verified and pill box refilled.      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   Anemia 03/29/2012    Current Outpatient Medications:    acetaminophen (TYLENOL) 500 MG tablet, Take 1,000 mg by mouth every 6 (six) hours as needed for moderate pain., Disp: , Rfl:    amiodarone (PACERONE) 200 MG tablet, TAKE 1/2 TABLET ( 100 MG ) BY MOUTH DAILIY, Disp: 45 tablet, Rfl: 3   apixaban (ELIQUIS) 5 MG TABS tablet, Take 1 tablet (5 mg total) by mouth 2 (two) times daily., Disp: 60 tablet, Rfl: 6   carvedilol (COREG) 3.125 MG tablet, Take 1 tablet (3.125 mg total) by mouth 2 (two) times daily., Disp: 60 tablet, Rfl: 3   cyclobenzaprine (FLEXERIL) 10 MG tablet, Take 1 tablet (10 mg total) by mouth 3 (three) times daily as needed for muscle spasms., Disp: 20 tablet, Rfl: 0   dapagliflozin propanediol  (FARXIGA) 10 MG TABS tablet, Take 1 tablet (10 mg total) by mouth daily. Needs appt for future refills, Disp: 30 tablet, Rfl: 6   digoxin (LANOXIN) 0.125 MG tablet, Take 1 tablet (0.125 mg total) by mouth daily., Disp: 90 tablet, Rfl: 3   doxylamine, Sleep, (UNISOM) 25 MG tablet, Take 25 mg by mouth at bedtime as needed for sleep., Disp: , Rfl:    ferrous sulfate 325 (65 FE) MG tablet, Take 325 mg by mouth daily with breakfast., Disp: , Rfl:    losartan (COZAAR) 25 MG tablet, Take 1 tablet (25 mg total) by mouth daily., Disp: 30 tablet, Rfl: 3   Multiple Vitamin (MULTIVITAMIN WITH MINERALS) TABS, Take 1 tablet by mouth daily., Disp: , Rfl:    potassium chloride SA (KLOR-CON M) 20 MEQ tablet, Take 4 tablets (80 mEq total) by mouth every morning AND 4 tablets (80 mEq total) every evening., Disp: 240 tablet, Rfl: 11   spironolactone (ALDACTONE) 25 MG tablet, Take 1 tablet (25 mg total) by mouth daily. Hold if systolic blood pressure less than 100. (Patient taking differently: Take 25 mg by mouth at bedtime. Hold if systolic blood pressure less than 100.), Disp: 45 tablet, Rfl: 3   torsemide (DEMADEX) 20 MG tablet, TAKE 4 TABLETS BY MOUTH IN THE MORNING AND 2 TABLETS EVERY EVENING, Disp: 545 tablet, Rfl: 2 No Known Allergies    Social History  Socioeconomic History   Marital status: Single    Spouse name: Not on file   Number of children: 3   Years of education: Not on file   Highest education level: Not on file  Occupational History   Occupation: receptionist    Employer: [employer]   Occupation: office cleaning  Tobacco Use   Smoking status: Never   Smokeless tobacco: Never  Vaping Use   Vaping status: Never Used  Substance and Sexual Activity   Alcohol use: Yes    Alcohol/week: 5.0 standard drinks of alcohol    Types: 5 Glasses of wine per week    Comment: daily   Drug use: Never   Sexual activity: Yes    Birth control/protection: Condom  Other Topics Concern   Not on file   Social History Narrative   Not on file   Social Determinants of Health   Financial Resource Strain: High Risk (10/09/2023)   Overall Financial Resource Strain (CARDIA)    Difficulty of Paying Living Expenses: Very hard  Food Insecurity: No Food Insecurity (07/02/2022)   Hunger Vital Sign    Worried About Running Out of Food in the Last Year: Never true    Ran Out of Food in the Last Year: Never true  Transportation Needs: No Transportation Needs (07/02/2022)   PRAPARE - Administrator, Civil Service (Medical): No    Lack of Transportation (Non-Medical): No  Physical Activity: Not on file  Stress: Not on file  Social Connections: Not on file  Intimate Partner Violence: Not on file    Physical Exam      Future Appointments  Date Time Provider Department Center  12/14/2023  3:05 PM [facility]  12/21/2023  3:00 PM Duke Salvia, MD [facility]  03/14/2024  3:05 PM [facility]ICE [facility]       Kerry Hough, Paramedic [phone] [facility] Paramedic  11/12/23

## 2023-11-19 ENCOUNTER — Telehealth (HOSPITAL_COMMUNITY): Payer: Self-pay

## 2023-11-19 NOTE — Telephone Encounter (Signed)
Reached out to pt to confirm todays appointment, she reported that she is tired and depressed and doesn't want a visit today.  Asked if there was anything I could do for her but she denied.  She asked to resch for next week and she can handle her meds this week.    Kerry Hough, EMT-Paramedic  [phone] 11/19/2023

## 2023-11-26 ENCOUNTER — Other Ambulatory Visit (HOSPITAL_COMMUNITY): Payer: Self-pay

## 2023-11-26 NOTE — Progress Notes (Signed)
Paramedicine Encounter    Patient ID: Sydney Shaffer, female    DOB: 12/09/1965, 57 y.o.   MRN: [id number]   Complaints-none   Edema-slight -per usual   Compliance with meds-yes  Pill box filled-yes  If so, by whom-paramedic   Refills needed-none    Pt reports she is doing good. She denies increased sob, no dizziness, she reports she went to disability doc appoint yesterday. She also reports her weight as being 198--she is usually in the 170s.  She has no symptoms whatsoever to suggest she has fluid overload much less that big of difference in the weight. I think something going on with scales. Or the previous scales she was using with the 170s have been wrong all along.  She was 188 in clinic a month ago.  She said she sent in remote transmission this past Monday. I sent message to clinic staff and the device clinic to see if it went through. I know in  her apt complex service is sketchy and sometimes even texts dont go through.  Pt denies n/v/d, able to sleep good at night. No increased sob, no dizziness, no c/p. No nothing physically that can be seen to suggest she has that much weight of fluid. Her appetite is good.  Will f/u on Monday with this also. No missed doses of her meds.  Meds verified and pill box refilled.   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   Anemia 03/29/2012    Current Outpatient  Medications:    acetaminophen (TYLENOL) 500 MG tablet, Take 1,000 mg by mouth every 6 (six) hours as needed for moderate pain., Disp: , Rfl:    amiodarone (PACERONE) 200 MG tablet, TAKE 1/2 TABLET ( 100 MG ) BY MOUTH DAILIY, Disp: 45 tablet, Rfl: 3   apixaban (ELIQUIS) 5 MG TABS tablet, Take 1 tablet (5 mg total) by mouth 2 (two) times daily., Disp: 60 tablet, Rfl: 6   carvedilol (COREG) 3.125 MG tablet, Take 1 tablet (3.125 mg total) by mouth 2 (two) times daily., Disp: 60 tablet, Rfl: 3   dapagliflozin propanediol (FARXIGA) 10 MG TABS tablet, Take 1 tablet (10 mg total) by mouth daily. Needs appt for future refills, Disp: 30 tablet, Rfl: 6   digoxin (LANOXIN) 0.125 MG tablet, Take 1 tablet (0.125 mg total) by mouth daily., Disp: 90 tablet, Rfl: 3   ferrous sulfate 325 (65 FE) MG tablet, Take 325 mg by mouth daily with breakfast., Disp: , Rfl:    losartan (COZAAR) 25 MG tablet, Take 1 tablet (25 mg total) by mouth daily., Disp: 30 tablet, Rfl: 3   Multiple Vitamin (MULTIVITAMIN WITH MINERALS) TABS, Take 1 tablet by mouth daily., Disp: , Rfl:    potassium chloride SA (KLOR-CON M) 20 MEQ tablet, Take 4 tablets (80 mEq total) by mouth every morning AND 4 tablets (80 mEq total) every evening., Disp: 240 tablet, Rfl:  11   spironolactone (ALDACTONE) 25 MG tablet, Take 1 tablet (25 mg total) by mouth daily. Hold if systolic blood pressure less than 100. (Patient taking differently: Take 25 mg by mouth at bedtime. Hold if systolic blood pressure less than 100.), Disp: 45 tablet, Rfl: 3   torsemide (DEMADEX) 20 MG tablet, TAKE 4 TABLETS BY MOUTH IN THE MORNING AND 2 TABLETS EVERY EVENING, Disp: 545 tablet, Rfl: 2   cyclobenzaprine (FLEXERIL) 10 MG tablet, Take 1 tablet (10 mg total) by mouth 3 (three) times daily as needed for muscle spasms. (Patient not taking: Reported on 11/26/2023), Disp: 20 tablet, Rfl: 0   doxylamine, Sleep, (UNISOM) 25 MG tablet, Take 25 mg by mouth at bedtime as needed for sleep.  (Patient not taking: Reported on 11/26/2023), Disp: , Rfl:  No Known Allergies    Social History   Socioeconomic History   Marital status: Single    Spouse name: Not on file   Number of children: 3   Years of education: Not on file   Highest education level: Not on file  Occupational History   Occupation: receptionist    Employer: [employer]   Occupation: office cleaning  Tobacco Use   Smoking status: Never   Smokeless tobacco: Never  Vaping Use   Vaping status: Never Used  Substance and Sexual Activity   Alcohol use: Yes    Alcohol/week: 5.0 standard drinks of alcohol    Types: 5 Glasses of wine per week    Comment: daily   Drug use: Never   Sexual activity: Yes    Birth control/protection: Condom  Other Topics Concern   Not on file  Social History Narrative   Not on file   Social Drivers of Health   Financial Resource Strain: High Risk (10/09/2023)   Overall Financial Resource Strain (CARDIA)    Difficulty of Paying Living Expenses: Very hard  Food Insecurity: No Food Insecurity (07/02/2022)   Hunger Vital Sign    Worried About Running Out of Food in the Last Year: Never true    Ran Out of Food in the Last Year: Never true  Transportation Needs: No Transportation Needs (07/02/2022)   PRAPARE - Administrator, Civil Service (Medical): No    Lack of Transportation (Non-Medical): No  Physical Activity: Not on file  Stress: Not on file  Social Connections: Not on file  Intimate Partner Violence: Not on file    Physical Exam      Future Appointments  Date Time Provider Department Center  12/14/2023  3:05 PM [facility]  12/21/2023  3:00 PM Duke Salvia, MD [facility]  03/14/2024  3:05 PM [facility]ICE [facility]OT[facility]       Kerry Hough, Paramedic [phone] [facility] Paramedic  11/26/23

## 2023-11-27 ENCOUNTER — Telehealth (HOSPITAL_COMMUNITY): Payer: Self-pay | Admitting: Licensed Clinical Social Worker

## 2023-11-27 ENCOUNTER — Telehealth (HOSPITAL_COMMUNITY): Payer: Self-pay | Admitting: Cardiology

## 2023-11-27 NOTE — Telephone Encounter (Signed)
H&V Care Navigation CSW Progress Note  Clinical Social Worker assisted patient in scanning and Scientist, forensic for rental assistance to [place] [facility] [place] case Financial controller.  Patient is participating in a Managed Medicaid Plan:  Yes  SDOH Screenings   Food Insecurity: No Food Insecurity (07/02/2022)  Housing: Medium Risk (10/09/2023)  Transportation Needs: No Transportation Needs (07/02/2022)  Depression (PHQ2-9): Medium Risk (05/14/2023)  Financial Resource Strain: High Risk (10/09/2023)  Tobacco Use: Low Risk  (10/27/2023)   Burna Sis, LCSW Clinical Social Worker [facility] Desk#: [phone] Cell#: [phone]

## 2023-11-27 NOTE — Telephone Encounter (Signed)
-----   Message from [facility] Katie L sent at 11/26/2023  7:07 PM EST ----- Regarding: transmission HF Paramedicine Message to [facility]  Pharmacy (if applicable):   Issue/reason for call:  weight/transmission     Weight gain? (How much over a week?) 2 wks-- 170>>>198 Weight loss? (How much over a week?)  Edema ? (Baseline or worse?)  none   Dyspnea?  (Baseline or worse?)   none      Medications are taken as prescribed?  Yes    Can you check to see if her transmission went through when she sent it Monday?   She reports her weight being 198 and 2 wks ago she was in the 170's.   She has no symptoms whatsoever, no sob, no c/p, edema. Able to lay flat sleeping as usual, nothing to show she has this much fluid on her. No bloating, appetite good. No n/v/d.   B/p 118/80   Thanks, Kerry Hough, EMT-Paramedic  [phone] 11/26/2023

## 2023-11-30 NOTE — Telephone Encounter (Signed)
Spoke with patient.  Advised reason for call is the manual remote transmission was not received on 12/20 as expected and HF clinic would like for her to try and send a manual transmission today.  She will not be home until 7:30 PM and will try tomorrow morning.  She is using an old monitor because the newest model she has was not working. Explained her device is paired only with the last monitor she received.  Offered assistance to send manual transmission tomorrow morning since she will not be home before the office closes today.  She will call back around 8:30 AM tomorrow morning.

## 2023-12-01 NOTE — Telephone Encounter (Signed)
Spoke with patient.  Unable to obtain manual remote transmission due to monitor is not working.  Receiving Error Code of 3230.  Provided [employer] tech support number and advised to call to report monitor trouble.  She will call today.    Update sent to [facility], NP and Michaelyn Barter, CMA that unable to obtain remote transmission.    Pt reports she is taking her fluid pill but weight remains at 198 lbs.  She reports baseline weight is in the 170's.  Advised HF clinic will follow up with her if needed.

## 2023-12-04 NOTE — Telephone Encounter (Signed)
Is device working now

## 2023-12-04 NOTE — Telephone Encounter (Signed)
Spoke with patient, she has not tried to a remote transmission since Tuesday but will try again today and will let us know what happens

## 2023-12-10 ENCOUNTER — Other Ambulatory Visit (HOSPITAL_COMMUNITY): Payer: Self-pay

## 2023-12-10 NOTE — Progress Notes (Signed)
 Paramedicine Encounter    Patient ID: Sydney Shaffer, female    DOB: [birth year], 58 y.o.   MRN: [id number]   Complaints-stressful  Edema-yes--advised compression stockings.   Compliance with meds-yes  Pill box filled-yes If so, by whom-paramedic   Refills needed-spiro--she is going by pharmacy to get      Pt reports feeling ok but stressed with housing situation.  She was asking help for rent but she is going to check with [facility].   Her device is not compatible with the older unit she has, so the transmissions weren't going through. They have ordered her another unit to match.   Her weights are still elevated, down 5lbs from 2 wks ago, but not showing any signs of fluid overload.  I am inclined to think her scales was not working properly maybe?   She denies increased sob, no dizziness, appetite ok.  Not sleeping well- she thinks its more related to stress.  Feels slight;y bloated.   She does have edema to her legs, advised her to get some compression stockings. She is on her feet while she does work the couple hours she does, so I told her that would be very helpful.   Meds verified and pill box refilled.    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   Anemia 03/29/2012    Current Outpatient Medications:    acetaminophen  (TYLENOL ) 500 MG tablet, Take 1,000 mg by mouth  every 6 (six) hours as needed for moderate pain., Disp: , Rfl:    amiodarone  (PACERONE ) 200 MG tablet, TAKE 1/2 TABLET ( 100 MG ) BY MOUTH DAILIY, Disp: 45 tablet, Rfl: 3   apixaban  (ELIQUIS ) 5 MG TABS tablet, Take 1 tablet (5 mg total) by mouth 2 (two) times daily., Disp: 60 tablet, Rfl: 6   carvedilol  (COREG ) 3.125 MG tablet, Take 1 tablet (3.125 mg total) by mouth 2 (two) times daily., Disp: 60 tablet, Rfl: 3   dapagliflozin  propanediol (FARXIGA ) 10 MG TABS tablet, Take 1 tablet (10 mg total) by mouth daily. Needs appt for future refills, Disp: 30 tablet, Rfl: 6   digoxin  (LANOXIN ) 0.125 MG tablet, Take 1 tablet (0.125 mg total) by mouth daily., Disp: 90 tablet, Rfl: 3   ferrous sulfate  325 (65 FE) MG tablet, Take 325 mg by mouth daily with breakfast., Disp: , Rfl:    losartan  (COZAAR ) 25 MG tablet, Take 1 tablet (25 mg total) by mouth daily. (Patient taking differently: Take 25 mg by mouth at bedtime.), Disp: 30 tablet, Rfl: 3   Multiple Vitamin (MULTIVITAMIN WITH MINERALS) TABS, Take 1 tablet by mouth daily., Disp: , Rfl:    potassium chloride  SA (KLOR-CON  M) 20 MEQ tablet, Take 4 tablets (80 mEq total) by mouth every morning AND 4 tablets (80 mEq total) every evening., Disp: 240 tablet, Rfl: 11   spironolactone  (ALDACTONE ) 25 MG tablet, Take 1 tablet (25 mg  total) by mouth daily. Hold if systolic blood pressure less than 100. (Patient taking differently: Take 25 mg by mouth at bedtime. Hold if systolic blood pressure less than 100.), Disp: 45 tablet, Rfl: 3   torsemide  (DEMADEX ) 20 MG tablet, TAKE 4 TABLETS BY MOUTH IN THE MORNING AND 2 TABLETS EVERY EVENING, Disp: 545 tablet, Rfl: 2   cyclobenzaprine  (FLEXERIL ) 10 MG tablet, Take 1 tablet (10 mg total) by mouth 3 (three) times daily as needed for muscle spasms. (Patient not taking: Reported on 11/26/2023), Disp: 20 tablet, Rfl: 0   doxylamine , Sleep, (UNISOM ) 25 MG tablet, Take 25 mg by mouth at bedtime as needed for sleep. (Patient not taking:  Reported on 11/26/2023), Disp: , Rfl:  No Known Allergies    Social History   Socioeconomic History   Marital status: Single    Spouse name: Not on file   Number of children: 3   Years of education: Not on file   Highest education level: Not on file  Occupational History   Occupation: receptionist    Employer: [employer]   Occupation: office cleaning  Tobacco Use   Smoking status: Never   Smokeless tobacco: Never  Vaping Use   Vaping status: Never Used  Substance and Sexual Activity   Alcohol use: Yes    Alcohol/week: 5.0 standard drinks of alcohol    Types: 5 Glasses of wine per week    Comment: daily   Drug use: Never   Sexual activity: Yes    Birth control/protection: Condom  Other Topics Concern   Not on file  Social History Narrative   Not on file   Social Drivers of Health   Financial Resource Strain: High Risk (10/09/2023)   Overall Financial Resource Strain (CARDIA)    Difficulty of Paying Living Expenses: Very hard  Food Insecurity: No Food Insecurity (07/02/2022)   Hunger Vital Sign    Worried About Running Out of Food in the Last Year: Never true    Ran Out of Food in the Last Year: Never true  Transportation Needs: No Transportation Needs (07/02/2022)   PRAPARE - Administrator, Civil Service (Medical): No    Lack of Transportation (Non-Medical): No  Physical Activity: Not on file  Stress: Not on file  Social Connections: Not on file  Intimate Partner Violence: Not on file    Physical Exam      Future Appointments  Date Time Provider Department Center  12/14/2023  3:05 PM [facility] DEVICE REMOTES [facility]  12/21/2023  3:00 PM Fernande Elspeth BROCKS, MD [facility]  01/15/2024  8:00 AM [facility] ECHO OP 1 [facility]  01/15/2024  9:20 AM Bensimhon, Toribio SAUNDERS, MD [facility] None  03/14/2024  3:05 PM [facility] DEVICE REMOTES [facility]       Izetta Quivers, Paramedic [phone] [facility] Paramedic   12/10/23

## 2023-12-11 ENCOUNTER — Telehealth: Payer: Self-pay

## 2023-12-11 NOTE — Telephone Encounter (Signed)
 I asked the pt to send a manual transmission but her handheld is not working. She called [employer] and They are sending her a new one. She will send a transmission when she receive her handheld for her monitor.

## 2023-12-11 NOTE — Telephone Encounter (Signed)
-----   Message from [facility] Katie L sent at 11/26/2023  7:07 PM EST ----- Regarding: transmission HF Paramedicine Message to [facility]  Pharmacy (if applicable):   Issue/reason for call:  weight/transmission     Weight gain? (How much over a week?) 2 wks-- 170>>>198 Weight loss? (How much over a week?)  Edema ? (Baseline or worse?)  none   Dyspnea?  (Baseline or worse?)   none      Medications are taken as prescribed?  Yes    Can you check to see if her transmission went through when she sent it Monday?   She reports her weight being 198 and 2 wks ago she was in the 170's.   She has no symptoms whatsoever, no sob, no c/p, edema. Able to lay flat sleeping as usual, nothing to show she has this much fluid on her. No bloating, appetite good. No n/v/d.   B/p 118/80   Thanks, Kerry Hough, EMT-Paramedic  [phone] 11/26/2023

## 2023-12-16 ENCOUNTER — Other Ambulatory Visit (HOSPITAL_COMMUNITY): Payer: Self-pay

## 2023-12-16 ENCOUNTER — Telehealth (HOSPITAL_COMMUNITY): Payer: Self-pay | Admitting: Licensed Clinical Social Worker

## 2023-12-16 NOTE — Progress Notes (Signed)
 Paramedicine Encounter    Patient ID: Sydney Shaffer, female    DOB: 04/24/1966, 58 y.o.   MRN: [id number]   Complaints-stressful   Edema-slight   Compliance with meds-yes  Pill box filled-yes  If so, by whom-paramedic   Refills needed-none   Pt reports feeling ok, just stressed about her living situation.   Still trying to find resources to help with rent. Found a few more places she hasn't tried online so gave her the phone numbers for those.  Her weight is coming down a few lbs. She still doesn't feel bloated or anything. Appetite ok.  She has not received new remote/base yet for transmitting her device.  Meds verified and pill box refilled.  She denies increased sob, no dizziness, no c/p.   BP 102/70   Pulse 80   Resp 16   Wt 189 lb (85.7 kg)   SpO2 99%   BMI 28.74 kg/m  Weight yesterday-? 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   Anemia 03/29/2012    Current Outpatient Medications:    acetaminophen  (TYLENOL ) 500 MG tablet, Take 1,000 mg by mouth every 6 (six) hours as needed for moderate pain., Disp: , Rfl:    amiodarone  (PACERONE ) 200 MG tablet, TAKE 1/2 TABLET ( 100 MG ) BY MOUTH DAILIY, Disp: 45 tablet, Rfl: 3   apixaban  (ELIQUIS ) 5 MG TABS tablet, Take 1 tablet (5 mg total) by mouth 2 (two) times daily., Disp: 60 tablet, Rfl: 6   carvedilol  (COREG ) 3.125 MG tablet, Take 1 tablet (3.125 mg total) by mouth 2 (two) times daily., Disp: 60 tablet, Rfl: 3   dapagliflozin  propanediol (FARXIGA ) 10 MG TABS  tablet, Take 1 tablet (10 mg total) by mouth daily. Needs appt for future refills, Disp: 30 tablet, Rfl: 6   digoxin  (LANOXIN ) 0.125 MG tablet, Take 1 tablet (0.125 mg total) by mouth daily., Disp: 90 tablet, Rfl: 3   ferrous sulfate  325 (65 FE) MG tablet, Take 325 mg by mouth daily with breakfast., Disp: , Rfl:    losartan  (COZAAR ) 25 MG tablet, Take 1 tablet (25 mg total) by mouth daily. (Patient taking differently: Take 25 mg by mouth at bedtime.), Disp: 30 tablet, Rfl: 3   Multiple Vitamin (MULTIVITAMIN WITH MINERALS) TABS, Take 1 tablet by mouth daily., Disp: , Rfl:    potassium chloride  SA (KLOR-CON  M) 20 MEQ tablet, Take 4 tablets (80 mEq total) by mouth every morning AND 4 tablets (80 mEq total) every evening., Disp: 240 tablet, Rfl: 11   spironolactone  (ALDACTONE ) 25 MG tablet, Take 1 tablet (25 mg total) by mouth daily. Hold if systolic blood pressure less than 100. (Patient taking differently: Take 25 mg by mouth at bedtime. Hold if systolic blood pressure less than 100.), Disp: 45 tablet, Rfl: 3   torsemide  (DEMADEX ) 20 MG tablet, TAKE 4 TABLETS BY MOUTH IN THE MORNING AND 2 TABLETS EVERY EVENING, Disp: 545 tablet, Rfl: 2   cyclobenzaprine  (FLEXERIL ) 10 MG tablet, Take 1 tablet (10 mg total) by mouth 3 (three) times daily as needed for muscle spasms. (Patient not taking: Reported  on 11/26/2023), Disp: 20 tablet, Rfl: 0   doxylamine , Sleep, (UNISOM ) 25 MG tablet, Take 25 mg by mouth at bedtime as needed for sleep. (Patient not taking: Reported on 11/26/2023), Disp: , Rfl:  No Known Allergies    Social History   Socioeconomic History   Marital status: Single    Spouse name: Not on file   Number of children: 3   Years of education: Not on file   Highest education level: Not on file  Occupational History   Occupation: receptionist    Employer: [employer]   Occupation: office cleaning  Tobacco Use   Smoking status: Never   Smokeless tobacco: Never  Vaping Use   Vaping status:  Never Used  Substance and Sexual Activity   Alcohol use: Yes    Alcohol/week: 5.0 standard drinks of alcohol    Types: 5 Glasses of wine per week    Comment: daily   Drug use: Never   Sexual activity: Yes    Birth control/protection: Condom  Other Topics Concern   Not on file  Social History Narrative   Not on file   Social Drivers of Health   Financial Resource Strain: High Risk (12/16/2023)   Overall Financial Resource Strain (CARDIA)    Difficulty of Paying Living Expenses: Very hard  Food Insecurity: No Food Insecurity (07/02/2022)   Hunger Vital Sign    Worried About Running Out of Food in the Last Year: Never true    Ran Out of Food in the Last Year: Never true  Transportation Needs: No Transportation Needs (07/02/2022)   PRAPARE - Administrator, Civil Service (Medical): No    Lack of Transportation (Non-Medical): No  Physical Activity: Not on file  Stress: Not on file  Social Connections: Not on file  Intimate Partner Violence: Not on file    Physical Exam      Future Appointments  Date Time Provider Department Center  12/21/2023  3:00 PM Fernande Elspeth BROCKS, MD [facility]  01/15/2024  8:00 AM [facility] ECHO OP 1 [facility]  01/15/2024  9:20 AM Bensimhon, Toribio SAUNDERS, MD [facility] None  03/14/2024  3:05 PM [facility] DEVICE REMOTES [facility]       Izetta Quivers, Paramedic [phone] [facility] Paramedic  12/16/23

## 2023-12-16 NOTE — Telephone Encounter (Signed)
 H&V Care Navigation CSW Progress Note  Clinical Social Worker received call from pt requesting guidance for financial assistance with rent.  Patient currently working with [facility] to apply for [employer] assistance but no updates on this yet- encouraged her to reach out to ensure she does not need to do anything further.  She has already spoken with other local resources and no funding available.  Patient lives alone and only makes $400/month at this time- disability pending.  Is behind about $4000 in rent by estimate and is being required to pay $700/month in rent bond to the court of avoid eviction.  Patient has been talking about having someone move in with her with another source of income but this has not happened yet.  CSW informed we could not assist with patient care fund until realistic plan made for how she will be able to pay moving forward- encouraged her to pursue roommate possibilities and follow up with [facility] assistance and get back with me to see if we could help once her situation has changed.  Also encouraged her to call Managed Medicaid customer service to ask about crisis assistance.  Patient is participating in a Managed Medicaid Plan:  Yes  SDOH Screenings   Food Insecurity: No Food Insecurity (07/02/2022)  Housing: Medium Risk (10/09/2023)  Transportation Needs: No Transportation Needs (07/02/2022)  Depression (PHQ2-9): Medium Risk (05/14/2023)  Financial Resource Strain: High Risk (12/16/2023)  Tobacco Use: Low Risk  (10/27/2023)   Sydney HILARIO Leech, LCSW Clinical Social Worker [facility] Desk#: [phone] Cell#: [phone]

## 2023-12-21 ENCOUNTER — Encounter: Payer: Self-pay | Admitting: Internal Medicine

## 2023-12-21 ENCOUNTER — Ambulatory Visit: Payer: Medicaid Other | Attending: Internal Medicine | Admitting: Internal Medicine

## 2023-12-21 VITALS — BP 112/76 | HR 84 | Ht 68.0 in | Wt 189.6 lb

## 2023-12-21 DIAGNOSIS — I472 Ventricular tachycardia, unspecified: Secondary | ICD-10-CM

## 2023-12-21 DIAGNOSIS — I420 Dilated cardiomyopathy: Secondary | ICD-10-CM

## 2023-12-21 DIAGNOSIS — Z79899 Other long term (current) drug therapy: Secondary | ICD-10-CM

## 2023-12-21 DIAGNOSIS — Z95810 Presence of automatic (implantable) cardiac defibrillator: Secondary | ICD-10-CM

## 2023-12-21 DIAGNOSIS — I5022 Chronic systolic (congestive) heart failure: Secondary | ICD-10-CM

## 2023-12-21 NOTE — Patient Instructions (Signed)
 Medication Instructions:  Your physician recommends that you continue on your current medications as directed. Please refer to the Current Medication list given to you today.  *If you need a refill on your cardiac medications before your next appointment, please call your pharmacy*   Lab Work: Amiodarone  surveillance labs today: Digoxin  level, TSH & LFTs If you have labs (blood work) drawn today and your tests are completely normal, you will receive your results only by: MyChart Message (if you have MyChart) OR A paper copy in the mail If you have any lab test that is abnormal or we need to change your treatment, we will call you to review the results.   Testing/Procedures: None ordered   Follow-Up: At [facility], you and your health needs are our priority.  As part of our continuing mission to provide you with exceptional heart care, we have created designated Provider Care Teams.  These Care Teams include your primary Cardiologist (physician) and Advanced Practice Providers (APPs -  Physician Assistants and Nurse Practitioners) who all work together to provide you with the care you need, when you need it.  We recommend signing up for the patient portal called MyChart.  Sign up information is provided on this After Visit Summary.  MyChart is used to connect with patients for Virtual Visits (Telemedicine).  Patients are able to view lab/test results, encounter notes, upcoming appointments, etc.  Non-urgent messages can be sent to your provider as well.   To learn more about what you can do with MyChart, go to [email or web address].    Remote monitoring is used to monitor your Pacemaker or ICD from home. This monitoring reduces the number of office visits required to check your device to one time per year. It allows us  to keep an eye on the functioning of your device to ensure it is working properly. You are scheduled for a device check from home on 03/14/2024. You may send your  transmission at any time that day. If you have a wireless device, the transmission will be sent automatically. After your physician reviews your transmission, you will receive a postcard with your next transmission date.  Your next appointment:   1 year(s)  The format for your next appointment:   In Person  Provider:   Elspeth Sage, MD{  Thank you for choosing [facility]!!   [phone]

## 2023-12-21 NOTE — Progress Notes (Signed)
 Patient Care Team: Patient, No Pcp Per as PCP - General (General Practice)   HPI  Sydney Shaffer is a 58 y.o. 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  Was started on Eliquis .  (Up-to-date acknowledges scant data but recommendation is for anticoagulation in people with noncompaction ejection fraction of less than 20% as well as atrial fibrillation regardless of CHADS-VASc 4).  GEN change 12/20  She has had SVT and underwent ablation of the septal atrial tachycardia as well as slow pathway.  Recurrent atrial arrhythmias 2017 and ventricular tachycardia treated with amiodarone .  Has been maintained on amiodarone . \    The patient denies chest pain, nocturnal dyspnea, orthopnea or peripheral edema.  There have been no palpitations, lightheadedness or syncope.  Complains of some mild dyspnea.  But overall has felt better these last months than in a long time.  She is no longer working.   Patient denies symptoms of respiratory, GI intolerance, sun sensitivity, neurological symptoms attributable to amiodarone .  Productive cough>> high-resolution CT 2/24 was negative for interstitial lung disease .  DATE TEST EF    7/13 Echo  20-25 %    2/15 Echo 20-25 %    10/17 Echo  30-35    10/17 Cath   Normal CA  6/19 Echo  30-35%    4/23 Echo  25-30% MR mod  3/24 Echo   <20% LAE severe MR mod  7/24 L/RHC <20% Normal CA Mildly elevated filling pressure           Date Cr K Hgb TSH LFTs PFTs  9/17        2.555 22    1/18 0.83 4.5          9/19 0.78 4.6 12.8(4/19) 2.9 15    12/20 1.97 3.8 13.6 1.46(9/20) 21   9/23 1.12 3.8<<2.8 13.0 (7/23) 1.8 (4/23)    1/24 1.14 4.0       11/24 1.08 3.8 14.3          Records and Results Reviewed   Past Medical History:  Diagnosis Date   Acute on chronic systolic heart failure (HCC) 05/05/2012   Anemia     felt to be due to heavy menstrual flow   Atrial tachycardia (HCC)    s/p ablation   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    Chronic systolic heart failure (HCC)    Dysrhythmia    Bradycardia   Hypertension    Hypertensive heart disease with CHF (HCC)    ICD (implantable cardiac defibrillator) in place 08/13/2012   Menorrhagia    Metrorrhagia 06/04/2015   SVT (supraventricular tachycardia) (HCC) 03/30/2014   VT (ventricular tachycardia) (HCC) 07/22/2014    Past Surgical History:  Procedure Laterality Date   APPENDECTOMY     CARDIAC CATHETERIZATION  April 2013   normal coronaries   CARDIAC CATHETERIZATION [name] 09/12/2016   Procedure: Right/Left Heart Cath and Coronary Angiography;  Surgeon: Toribio JONELLE Fuel, MD;  Location: [facility] INVASIVE CV LAB;  Service: Cardiovascular;  Laterality: [name];   DILITATION & CURRETTAGE/HYSTROSCOPY WITH VERSAPOINT RESECTION [name] 10/05/2015   Procedure: DILATATION & CURETTAGE/HYSTEROSCOPY WITH VERSAPOINT RESECTION;  Surgeon: Percilla Burly, MD;  Location:  [facility] ORS;  Service: Gynecology;  Laterality: [name];   EP study and ablation  01/07/13   Ablation of AVNRT and atrial tachycardia (arising from the anteroseptal RA 12mm above the HIS)   ICD  08/13/2012   ICD GENERATOR CHANGEOUT [name] 11/14/2019   Procedure: ICD GENERATOR CHANGEOUT;  Surgeon: Fernande Elspeth BROCKS, MD;  Location: [facility] INVASIVE CV LAB;  Service: Cardiovascular;  Laterality: [name];   IMPLANTABLE CARDIOVERTER DEFIBRILLATOR IMPLANT [name] 08/13/2012   Procedure: IMPLANTABLE CARDIOVERTER DEFIBRILLATOR IMPLANT;  Surgeon: Elspeth BROCKS Fernande, MD;  Location: [facility] CATH LAB;  Service: Cardiovascular;  Laterality: [name];   LEFT HEART CATHETERIZATION WITH CORONARY ANGIOGRAM [name] 04/02/2012   Procedure: LEFT HEART CATHETERIZATION WITH CORONARY ANGIOGRAM;  Surgeon:  Peter M Jordan, MD;  Location: [facility] CATH LAB;  Service: Cardiovascular;  Laterality: [name];   RIGHT/LEFT HEART CATH AND CORONARY ANGIOGRAPHY [name] 07/01/2023   Procedure: RIGHT/LEFT HEART CATH AND CORONARY ANGIOGRAPHY;  Surgeon: Cherrie Toribio SAUNDERS, MD;  Location: [facility] INVASIVE CV LAB;  Service: Cardiovascular;  Laterality: [name];   SUPRAVENTRICULAR TACHYCARDIA ABLATION [name] 01/07/2013   Procedure: SUPRAVENTRICULAR TACHYCARDIA ABLATION;  Surgeon: Lynwood Rakers, MD;  Location: [facility] CATH LAB;  Service: Cardiovascular;  Laterality: [name];   TUBAL LIGATION      No outpatient medications have been marked as taking for the 12/21/23 encounter (Office Visit) with Fernande Elspeth BROCKS, MD.    No Known Allergies    Review of Systems negative except from HPI and PMH  Physical Exam BP 112/76   Pulse 84   Ht 5' 8 (1.727 m)   Wt 189 lb 9.6 oz (86 kg)   SpO2 97%   BMI 28.83 kg/m   Well developed and well nourished in no acute distress HENT normal Neck supple with JVP-flat Clear Device pocket well healed; without hematoma or erythema.  There is no tethering  Regular rate and rhythm, no  gallop No  murmur Abd-soft with active BS No Clubbing cyanosis  edema Skin-warm and dry A & Oriented  Grossly normal sensory and motor function  ECG sinus @ 84 18/10/44  Device function is normal. Programming changes none  See Paceart for details    CrCl cannot be calculated (Patient's most recent lab result is older than the maximum 21 days allowed.).   Assessment and  Plan  Nonischemic cardiomyopathy question cardiac noncompaction   Atrial Tachycardia/Flutter (CL310)   ICD Medtronic     CHF chronic systolic  High Risk Medication Surveillance amiodarone   Sinus tachycardia  Cough-active   Continue GDMT for cardiomyopathy continue Farxiga  Aldactone  losartan  and carvedilol .  Ventricular tachycardia quiescient.  Atrial arrhythmias quiescient.  Continue amiodarone  at 100 and apixaban   Will check Lanoxin   level

## 2023-12-22 LAB — HEPATIC FUNCTION PANEL
ALT: 22 [IU]/L (ref 0–32)
AST: 25 [IU]/L (ref 0–40)
Albumin: 4.3 g/dL (ref 3.8–4.9)
Alkaline Phosphatase: 144 [IU]/L — ABNORMAL HIGH (ref 44–121)
Bilirubin Total: 0.7 mg/dL (ref 0.0–1.2)
Bilirubin, Direct: 0.24 mg/dL (ref 0.00–0.40)
Total Protein: 7.7 g/dL (ref 6.0–8.5)

## 2023-12-22 LAB — DIGOXIN LEVEL: Digoxin, Serum: 0.5 ng/mL (ref 0.5–0.9)

## 2023-12-22 LAB — TSH: TSH: 2.55 u[IU]/mL (ref 0.450–4.500)

## 2023-12-23 ENCOUNTER — Telehealth (HOSPITAL_COMMUNITY): Payer: Self-pay | Admitting: Licensed Clinical Social Worker

## 2023-12-23 NOTE — Telephone Encounter (Signed)
 H&V Care Navigation CSW Progress Note  Clinical Social Worker received call from pt requesting help turning in paperwork to [facility] for potential rental assistance.  Patient came by clinic with paperwork and CSW made copies and emailed to [facility] case worker for review.   SDOH Screenings   Food Insecurity: No Food Insecurity (07/02/2022)  Housing: Medium Risk (10/09/2023)  Transportation Needs: No Transportation Needs (07/02/2022)  Depression (PHQ2-9): Medium Risk (05/14/2023)  Financial Resource Strain: High Risk (12/16/2023)  Tobacco Use: Low Risk  (12/21/2023)   Denton Flakes, LCSW Clinical Social Worker [facility] Desk#: [phone] Cell#: [phone]

## 2023-12-24 ENCOUNTER — Other Ambulatory Visit (HOSPITAL_COMMUNITY): Payer: Self-pay

## 2023-12-24 NOTE — Progress Notes (Signed)
Paramedicine Encounter    Patient ID: Sydney Shaffer, female    DOB: December 06, 1966, 58 y.o.   MRN: [id number]   Complaints-slight bloated   Edema-very slight to legs  Compliance with meds-yes  Pill box filled-yes X 2 wks  If so, by whom-paramedic   Refills needed-none    Pt reports physically she is feeling ok, but mentally stressed. She was asked to turn in more paperwork for rent assist, which she did, but turns out they can't help until the back rent is paid.  So she is starting to pack things up.  Plan to move in with son if forced to move.  Meds verified and pill boxes for 2 wks filled.  I filled 2 wks due to possible winter weather next week.  No changes from dr Koren Bound office visit.   Pt denies sob, no dizziness, no c/p.  She is getting a new remote device to send transmissions.    BP 102/72   Pulse 72   Resp 16   Wt 189 lb (85.7 kg)   SpO2 99%   BMI 28.74 kg/m  Weight yesterday-? 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   Anemia 03/29/2012    Current Outpatient Medications:    acetaminophen (TYLENOL) 500 MG tablet, Take 1,000 mg by mouth every 6 (six) hours as needed for moderate pain., Disp: , Rfl:    amiodarone (PACERONE) 200 MG tablet, TAKE 1/2 TABLET ( 100 MG ) BY MOUTH DAILIY, Disp: 45 tablet, Rfl: 3   apixaban (ELIQUIS) 5 MG TABS tablet, Take 1 tablet (5 mg total) by mouth 2 (two) times daily., Disp: 60 tablet, Rfl: 6   carvedilol (COREG) 3.125 MG tablet, Take 1 tablet (3.125 mg total) by mouth 2  (two) times daily., Disp: 60 tablet, Rfl: 3   dapagliflozin propanediol (FARXIGA) 10 MG TABS tablet, Take 1 tablet (10 mg total) by mouth daily. Needs appt for future refills, Disp: 30 tablet, Rfl: 6   digoxin (LANOXIN) 0.125 MG tablet, Take 1 tablet (0.125 mg total) by mouth daily., Disp: 90 tablet, Rfl: 3   losartan (COZAAR) 25 MG tablet, Take 1 tablet (25 mg total) by mouth daily. (Patient taking differently: Take 25 mg by mouth at bedtime.), Disp: 30 tablet, Rfl: 3   Multiple Vitamin (MULTIVITAMIN WITH MINERALS) TABS, Take 1 tablet by mouth daily., Disp: , Rfl:    potassium chloride SA (KLOR-CON M) 20 MEQ tablet, Take 4 tablets (80 mEq total) by mouth every morning AND 4 tablets (80 mEq total) every evening., Disp: 240 tablet, Rfl: 11   spironolactone (ALDACTONE) 25 MG tablet, Take 1 tablet (25 mg total) by mouth daily. Hold if systolic blood pressure less than 100. (Patient taking differently: Take 25 mg by mouth at bedtime. Hold if systolic blood pressure less than 100.), Disp: 45 tablet, Rfl: 3   torsemide (DEMADEX) 20 MG tablet, TAKE 4 TABLETS BY MOUTH IN THE MORNING AND 2 TABLETS EVERY EVENING, Disp: 545 tablet, Rfl: 2   cyclobenzaprine (FLEXERIL) 10 MG tablet, Take 1 tablet (10 mg total) by mouth 3 (three) times daily  as needed for muscle spasms., Disp: 20 tablet, Rfl: 0   doxylamine, Sleep, (UNISOM) 25 MG tablet, Take 25 mg by mouth at bedtime as needed for sleep. (Patient not taking: Reported on 12/24/2023), Disp: , Rfl:    ferrous sulfate 325 (65 FE) MG tablet, Take 325 mg by mouth daily with breakfast., Disp: , Rfl:  No Known Allergies    Social History   Socioeconomic History   Marital status: Single    Spouse name: Not on file   Number of children: 3   Years of education: Not on file   Highest education level: Not on file  Occupational History   Occupation: receptionist    Employer: [employer]   Occupation: office cleaning  Tobacco Use   Smoking status: Never   Smokeless  tobacco: Never  Vaping Use   Vaping status: Never Used  Substance and Sexual Activity   Alcohol use: Yes    Alcohol/week: 5.0 standard drinks of alcohol    Types: 5 Glasses of wine per week    Comment: daily   Drug use: Never   Sexual activity: Yes    Birth control/protection: Condom  Other Topics Concern   Not on file  Social History Narrative   Not on file   Social Drivers of Health   Financial Resource Strain: High Risk (12/16/2023)   Overall Financial Resource Strain (CARDIA)    Difficulty of Paying Living Expenses: Very hard  Food Insecurity: No Food Insecurity (07/02/2022)   Hunger Vital Sign    Worried About Running Out of Food in the Last Year: Never true    Ran Out of Food in the Last Year: Never true  Transportation Needs: No Transportation Needs (07/02/2022)   PRAPARE - Administrator, Civil Service (Medical): No    Lack of Transportation (Non-Medical): No  Physical Activity: Not on file  Stress: Not on file  Social Connections: Not on file  Intimate Partner Violence: Not on file    Physical Exam      Future Appointments  Date Time Provider Department Center  01/15/2024  8:00 AM [facility] ECHO OP 1 [facility]  01/15/2024  9:20 AM Bensimhon, Bevelyn Buckles, MD [facility] None  03/14/2024  3:05 PM [facility] DEVICE REMOTES [facility]       Kerry Hough, Paramedic [phone] [facility] Paramedic  12/24/23

## 2023-12-30 ENCOUNTER — Telehealth: Payer: Self-pay

## 2023-12-30 NOTE — Telephone Encounter (Signed)
-----   Message from Sherryl Manges sent at 12/28/2023  9:07 PM EST -----  Please inform patient that drug surveillance labs are normal  ALK phos is mildly elevated almost never seen with amio

## 2023-12-30 NOTE — Telephone Encounter (Signed)
LMOVM asking pt to return device clinic call.

## 2023-12-30 NOTE — Telephone Encounter (Signed)
Spoke with pt and advised of results as below per Dr Graciela Husbands.  Pt advised she may also follow up with PCP as Dr Graciela Husbands elevated Alk Phos almost never associated with Amiodarone.  Pt verbalized understanding and thanked Charity fundraiser for the call.

## 2023-12-31 ENCOUNTER — Other Ambulatory Visit (HOSPITAL_COMMUNITY): Payer: Self-pay

## 2023-12-31 NOTE — Progress Notes (Signed)
Paramedicine Encounter    Patient ID: Sydney Shaffer, female    DOB: February 13, 1966, 58 y.o.   MRN: [id number]   Complaints-had c/p few days ago   Edema-no   Compliance with meds-yes  Pill box filled-yes  If so, by whom-paramedic   Refills needed-spiro, potassium   Pt reports she is doing ok, no help in sight for her rent issue. She is still trying to figure things out. She reports she felt some chest tightness a few days ago while at work. She wasn't exerting herself at work when this started. She sat down and after a few min it went away.  Weight is coming down.   She denies increased sob, no dizziness.  Meds verified and pill box refilled.   I would like to get her started on bubble packs this next visit.    I advised her to continue to reach out to agencies that can assist with rent, as they may get funds at any time, its really not known when these places get them, so I told her to keep checking.    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   Anemia 03/29/2012    Current Outpatient Medications:    acetaminophen (TYLENOL) 500 MG tablet, Take 1,000 mg by mouth every 6 (six) hours as needed for moderate pain., Disp: , Rfl:    amiodarone (PACERONE) 200 MG tablet, TAKE 1/2 TABLET ( 100 MG ) BY MOUTH DAILIY, Disp: 45 tablet, Rfl: 3   apixaban (ELIQUIS) 5 MG TABS tablet, Take 1 tablet (5 mg total) by mouth 2 (two)  times daily., Disp: 60 tablet, Rfl: 6   carvedilol (COREG) 3.125 MG tablet, Take 1 tablet (3.125 mg total) by mouth 2 (two) times daily., Disp: 60 tablet, Rfl: 3   dapagliflozin propanediol (FARXIGA) 10 MG TABS tablet, Take 1 tablet (10 mg total) by mouth daily. Needs appt for future refills, Disp: 30 tablet, Rfl: 6   digoxin (LANOXIN) 0.125 MG tablet, Take 1 tablet (0.125 mg total) by mouth daily., Disp: 90 tablet, Rfl: 3   losartan (COZAAR) 25 MG tablet, Take 1 tablet (25 mg total) by mouth daily. (Patient taking differently: Take 25 mg by mouth at bedtime.), Disp: 30 tablet, Rfl: 3   Multiple Vitamin (MULTIVITAMIN WITH MINERALS) TABS, Take 1 tablet by mouth daily., Disp: , Rfl:    potassium chloride SA (KLOR-CON M) 20 MEQ tablet, Take 4 tablets (80 mEq total) by mouth every morning AND 4 tablets (80 mEq total) every evening., Disp: 240 tablet, Rfl: 11   spironolactone (ALDACTONE) 25 MG tablet, Take 1 tablet (25 mg total) by mouth daily. Hold if systolic blood pressure less than 100. (Patient taking differently: Take 25 mg by mouth at bedtime. Hold if systolic blood pressure less than 100.), Disp: 45 tablet, Rfl: 3   torsemide (DEMADEX) 20 MG tablet, TAKE 4 TABLETS BY MOUTH IN THE MORNING AND 2 TABLETS EVERY  EVENING, Disp: 545 tablet, Rfl: 2   cyclobenzaprine (FLEXERIL) 10 MG tablet, Take 1 tablet (10 mg total) by mouth 3 (three) times daily as needed for muscle spasms. (Patient not taking: Reported on 12/31/2023), Disp: 20 tablet, Rfl: 0   doxylamine, Sleep, (UNISOM) 25 MG tablet, Take 25 mg by mouth at bedtime as needed for sleep. (Patient not taking: Reported on 12/24/2023), Disp: , Rfl:    ferrous sulfate 325 (65 FE) MG tablet, Take 325 mg by mouth daily with breakfast., Disp: , Rfl:  No Known Allergies    Social History   Socioeconomic History   Marital status: Single    Spouse name: Not on file   Number of children: 3   Years of education: Not on file   Highest education level: Not on  file  Occupational History   Occupation: receptionist    Employer: [employer]   Occupation: office cleaning  Tobacco Use   Smoking status: Never   Smokeless tobacco: Never  Vaping Use   Vaping status: Never Used  Substance and Sexual Activity   Alcohol use: Yes    Alcohol/week: 5.0 standard drinks of alcohol    Types: 5 Glasses of wine per week    Comment: daily   Drug use: Never   Sexual activity: Yes    Birth control/protection: Condom  Other Topics Concern   Not on file  Social History Narrative   Not on file   Social Drivers of Health   Financial Resource Strain: High Risk (12/16/2023)   Overall Financial Resource Strain (CARDIA)    Difficulty of Paying Living Expenses: Very hard  Food Insecurity: No Food Insecurity (07/02/2022)   Hunger Vital Sign    Worried About Running Out of Food in the Last Year: Never true    Ran Out of Food in the Last Year: Never true  Transportation Needs: No Transportation Needs (07/02/2022)   PRAPARE - Administrator, Civil Service (Medical): No    Lack of Transportation (Non-Medical): No  Physical Activity: Not on file  Stress: Not on file  Social Connections: Not on file  Intimate Partner Violence: Not on file    Physical Exam      Future Appointments  Date Time Provider Department Center  01/15/2024  8:00 AM [facility] ECHO OP 1 [facility]  01/15/2024  9:20 AM Bensimhon, Bevelyn Buckles, MD [facility] None  03/14/2024  3:05 PM [facility] DEVICE REMOTES [facility]       Kerry Hough, Paramedic [phone] [facility] Paramedic  12/31/23

## 2024-01-03 LAB — CUP PACEART INCLINIC DEVICE CHECK
Date Time Interrogation Session: 20250113160450
Implantable Lead Connection Status: 753985
Implantable Lead Connection Status: 753985
Implantable Lead Implant Date: 20130906
Implantable Lead Implant Date: 20130906
Implantable Lead Location: 753859
Implantable Lead Location: 753860
Implantable Lead Model: 181
Implantable Lead Model: 5076
Implantable Lead Serial Number: 322962
Implantable Pulse Generator Implant Date: 20201207

## 2024-01-07 ENCOUNTER — Other Ambulatory Visit (HOSPITAL_COMMUNITY): Payer: Self-pay

## 2024-01-07 NOTE — Progress Notes (Signed)
Paramedicine Encounter    Patient ID: Sydney Shaffer, female    DOB: 12-12-1965, 58 y.o.   MRN: [id number]   Complaints-stressed   Edema-feels bloated  Compliance with meds-yes  Pill box filled-yes  If so, by whom-paramedic   Refills needed- Carvedilol Farxiga Losartan Cleda Daub -missing in sat PM dose  Torsemide    Pt reports just being stressed right now with her living situation. She is still hanging in there, but feels defeated and emotionally drained and tired.  Her weight is back up to 191. She has drank more fluids that she should.  She does feel a little bloated. She reports her breathing is doing ok, some sob every now and then,  She does have come tightness epigastric pains every now and then for a minute. She does not recall if she has done anything strenuous at these times. But it lasts for a minute but then goes away.  Last episode was last night while laying down.   12 lead EKG was done and no changes from last EKG on the 1/13  She has clinic appoint next Friday with an ECHO.   Meds verified and pill box refilled.  Med refills requested ordered.   BP 122/84   Pulse 64   Resp 16   Wt 191 lb (86.6 kg)   SpO2 98%   BMI 29.04 kg/m  Weight yesterday--? 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   Anemia 03/29/2012    Current Outpatient Medications:    acetaminophen (TYLENOL) 500 MG tablet, Take 1,000 mg by mouth every 6 (six) hours as needed for moderate pain., Disp: , Rfl:    amiodarone (PACERONE) 200  MG tablet, TAKE 1/2 TABLET ( 100 MG ) BY MOUTH DAILIY, Disp: 45 tablet, Rfl: 3   apixaban (ELIQUIS) 5 MG TABS tablet, Take 1 tablet (5 mg total) by mouth 2 (two) times daily., Disp: 60 tablet, Rfl: 6   carvedilol (COREG) 3.125 MG tablet, Take 1 tablet (3.125 mg total) by mouth 2 (two) times daily., Disp: 60 tablet, Rfl: 3   cyclobenzaprine (FLEXERIL) 10 MG tablet, Take 1 tablet (10 mg total) by mouth 3 (three) times daily as needed for muscle spasms. (Patient not taking: Reported on 12/31/2023), Disp: 20 tablet, Rfl: 0   dapagliflozin propanediol (FARXIGA) 10 MG TABS tablet, Take 1 tablet (10 mg total) by mouth daily. Needs appt for future refills, Disp: 30 tablet, Rfl: 6   digoxin (LANOXIN) 0.125 MG tablet, Take 1 tablet (0.125 mg total) by mouth daily., Disp: 90 tablet, Rfl: 3   doxylamine, Sleep, (UNISOM) 25 MG tablet, Take 25 mg by mouth at bedtime as needed for sleep. (Patient not taking: Reported on 12/24/2023), Disp: , Rfl:    ferrous sulfate 325 (65 FE) MG tablet, Take 325 mg by mouth daily with breakfast., Disp: , Rfl:    losartan (COZAAR) 25 MG tablet, Take 1 tablet (25 mg total) by mouth daily. (Patient taking differently: Take 25 mg by mouth at bedtime.), Disp: 30 tablet, Rfl: 3   Multiple Vitamin (MULTIVITAMIN WITH MINERALS) TABS, Take 1 tablet by  mouth daily., Disp: , Rfl:    potassium chloride SA (KLOR-CON M) 20 MEQ tablet, Take 4 tablets (80 mEq total) by mouth every morning AND 4 tablets (80 mEq total) every evening., Disp: 240 tablet, Rfl: 11   spironolactone (ALDACTONE) 25 MG tablet, Take 1 tablet (25 mg total) by mouth daily. Hold if systolic blood pressure less than 100. (Patient taking differently: Take 25 mg by mouth at bedtime. Hold if systolic blood pressure less than 100.), Disp: 45 tablet, Rfl: 3   torsemide (DEMADEX) 20 MG tablet, TAKE 4 TABLETS BY MOUTH IN THE MORNING AND 2 TABLETS EVERY EVENING, Disp: 545 tablet, Rfl: 2 No Known Allergies    Social History    Socioeconomic History   Marital status: Single    Spouse name: Not on file   Number of children: 3   Years of education: Not on file   Highest education level: Not on file  Occupational History   Occupation: receptionist    Employer: [employer]   Occupation: office cleaning  Tobacco Use   Smoking status: Never   Smokeless tobacco: Never  Vaping Use   Vaping status: Never Used  Substance and Sexual Activity   Alcohol use: Yes    Alcohol/week: 5.0 standard drinks of alcohol    Types: 5 Glasses of wine per week    Comment: daily   Drug use: Never   Sexual activity: Yes    Birth control/protection: Condom  Other Topics Concern   Not on file  Social History Narrative   Not on file   Social Drivers of Health   Financial Resource Strain: High Risk (12/16/2023)   Overall Financial Resource Strain (CARDIA)    Difficulty of Paying Living Expenses: Very hard  Food Insecurity: No Food Insecurity (07/02/2022)   Hunger Vital Sign    Worried About Running Out of Food in the Last Year: Never true    Ran Out of Food in the Last Year: Never true  Transportation Needs: No Transportation Needs (07/02/2022)   PRAPARE - Administrator, Civil Service (Medical): No    Lack of Transportation (Non-Medical): No  Physical Activity: Not on file  Stress: Not on file  Social Connections: Not on file  Intimate Partner Violence: Not on file    Physical Exam      Future Appointments  Date Time Provider Department Center  01/15/2024  8:00 AM [facility] ECHO OP 1 [facility]  01/15/2024  9:20 AM Bensimhon, Bevelyn Buckles, MD [facility] None  03/14/2024  3:05 PM [facility] DEVICE REMOTES [facility]       Kerry Hough, Paramedic [phone] [facility] Paramedic  01/07/24

## 2024-01-08 ENCOUNTER — Telehealth (HOSPITAL_COMMUNITY): Payer: Self-pay | Admitting: Licensed Clinical Social Worker

## 2024-01-08 NOTE — Telephone Encounter (Signed)
H&V Care Navigation CSW Progress Note  Clinical Social Worker received call from pt informing me that she spoke with clerk of courts and that she is due to be padlocked today.  CSW sent messages to legal aid workers to see if she can be padlocked without official notice and am waiting on a response.  Pt contacted landlord who reports she actually won't be padlocked until Wednesday next week.  Pt reports if she is padlocked today she would have a place to go.  CSW provided pt with coordinated re-entry number to call since she will soon be unhoused- pt to call and complete assessment.  Unfortunately patient unable to afford any housing options with current income- continue to await disability determination.  Patient is participating in a Managed Medicaid Plan:  Yes  SDOH Screenings   Food Insecurity: No Food Insecurity (07/02/2022)  Housing: High Risk (01/08/2024)  Transportation Needs: No Transportation Needs (07/02/2022)  Depression (PHQ2-9): Medium Risk (05/14/2023)  Financial Resource Strain: High Risk (12/16/2023)  Tobacco Use: Low Risk  (12/21/2023)   Burna Sis, LCSW Clinical Social Worker [facility] Desk#: [phone] Cell#: [phone]

## 2024-01-12 ENCOUNTER — Telehealth (HOSPITAL_COMMUNITY): Payer: Self-pay | Admitting: Licensed Clinical Social Worker

## 2024-01-12 NOTE — Telephone Encounter (Signed)
 H&V Care Navigation CSW Progress Note  Clinical Social Worker received call from pt stating that [employer] has approved her request for rental assistance.  States that she has talked to management company ([employer] [place] [phone]) and that they won't continue with eviction if full amount due can be paid- states that after [employer] [place] pays she will owe $525 that they can't cover as it is related to late fees.    CSW informed of ability of patient care fund to assist with this $525 if she is able to provide us  with certain paperwork- food stamp card, proof of disability approval.  CSW spoke with management company and informed of need for invoice and w-9.  All necessary paperwork received- check request submitted.  Patient is participating in a Managed Medicaid Plan:  Yes  SDOH Screenings   Food Insecurity: No Food Insecurity (07/02/2022)  Housing: High Risk (01/12/2024)  Transportation Needs: No Transportation Needs (07/02/2022)  Depression (PHQ2-9): Medium Risk (05/14/2023)  Financial Resource Strain: High Risk (12/16/2023)  Tobacco Use: Low Risk  (12/21/2023)    Sydney HILARIO Leech, LCSW Clinical Social Worker [facility] Desk#: [phone] Cell#: [phone]

## 2024-01-14 ENCOUNTER — Telehealth (HOSPITAL_COMMUNITY): Payer: Self-pay | Admitting: Internal Medicine

## 2024-01-14 ENCOUNTER — Other Ambulatory Visit (HOSPITAL_COMMUNITY): Payer: Self-pay | Admitting: Family Medicine

## 2024-01-14 NOTE — Telephone Encounter (Signed)
 Called patient at [phone] and spoke to patient about her appointments.   Reminder call for patient about her echocardiogram at 8:00 AM and then to see Dr. Cherrie right after her echo at 9:20 AM.   Confirmed appointments with patient - - per patient, she will be here tomorrow 01/15/24 for her appointments.

## 2024-01-15 ENCOUNTER — Encounter (HOSPITAL_COMMUNITY): Payer: Self-pay | Admitting: Internal Medicine

## 2024-01-15 ENCOUNTER — Ambulatory Visit (HOSPITAL_BASED_OUTPATIENT_CLINIC_OR_DEPARTMENT_OTHER)
Admission: RE | Admit: 2024-01-15 | Discharge: 2024-01-15 | Disposition: A | Discharge status: Home / Self Care | Payer: Medicaid Other | Source: Ambulatory Visit | Attending: Internal Medicine | Admitting: Internal Medicine

## 2024-01-15 ENCOUNTER — Ambulatory Visit (HOSPITAL_COMMUNITY)
Admission: RE | Admit: 2024-01-15 | Discharge: 2024-01-15 | Disposition: A | Discharge status: Home / Self Care | Payer: Medicaid Other | Source: Ambulatory Visit | Attending: Internal Medicine | Admitting: Internal Medicine

## 2024-01-15 VITALS — BP 126/82 | HR 76 | Ht 69.0 in | Wt 187.0 lb

## 2024-01-15 DIAGNOSIS — I5022 Chronic systolic (congestive) heart failure: Secondary | ICD-10-CM

## 2024-01-15 DIAGNOSIS — I081 Rheumatic disorders of both mitral and tricuspid valves: Secondary | ICD-10-CM | POA: Diagnosis not present

## 2024-01-15 DIAGNOSIS — I472 Ventricular tachycardia, unspecified: Secondary | ICD-10-CM

## 2024-01-15 DIAGNOSIS — I371 Nonrheumatic pulmonary valve insufficiency: Secondary | ICD-10-CM | POA: Insufficient documentation

## 2024-01-15 LAB — BASIC METABOLIC PANEL
Anion gap: 12 (ref 5–15)
BUN: 17 mg/dL (ref 6–20)
CO2: 31 mmol/L (ref 22–32)
Calcium: 9.8 mg/dL (ref 8.9–10.3)
Chloride: 100 mmol/L (ref 98–111)
Creatinine, Ser: 0.94 mg/dL (ref 0.44–1.00)
GFR, Estimated: >60 mL/min (ref >60)
Glucose, Bld: 98 mg/dL (ref 70–99)
Potassium: 3.1 mmol/L — ABNORMAL LOW (ref 3.5–5.1)
Sodium: 143 mmol/L (ref 135–145)

## 2024-01-15 LAB — ECHOCARDIOGRAM COMPLETE
Area-P 1/2: 7.16 cm2
Est EF: 25
MV M vel: 4.28 m/s
MV Peak grad: 73.3 mm[Hg]
S' Lateral: 5.8 cm
Single Plane A4C EF: 26.3 %

## 2024-01-15 LAB — DIGOXIN LEVEL: Digoxin Level: 0.4 ng/mL — ABNORMAL LOW (ref 0.8–2.0)

## 2024-01-15 LAB — CBC
HCT: 40.8 % (ref 36.0–46.0)
Hemoglobin: 13.5 g/dL (ref 12.0–15.0)
MCH: 27.4 pg (ref 26.0–34.0)
MCHC: 33.1 g/dL (ref 30.0–36.0)
MCV: 82.9 fL (ref 80.0–100.0)
Platelets: 284 10*3/uL (ref 150–400)
RBC: 4.92 MIL/uL (ref 3.87–5.11)
RDW: 14.1 % (ref 11.5–15.5)
WBC: 7 10*3/uL (ref 4.0–10.5)
nRBC: 0 % (ref 0.0–0.2)

## 2024-01-15 LAB — BRAIN NATRIURETIC PEPTIDE: B Natriuretic Peptide: 521.8 pg/mL — ABNORMAL HIGH (ref 0.0–100.0)

## 2024-01-15 MED ORDER — LOSARTAN POTASSIUM 25 MG PO TABS: 25.0000 mg | ORAL_TABLET | Freq: Two times a day (BID) | ORAL | 3 refills | Status: DC

## 2024-01-15 NOTE — Patient Instructions (Addendum)
 Good to see you today!  INCREASE LOSARTAN  TO 25MG  TWICE DAILY   Labs done today, your results will be available in MyChart, we will contact you for abnormal readings.  FOLLOW UP IN 4-6 MONTHS PLEASE CALL OUR OFFICE AROUND MAY  TO GET SCHEDULED FOR YOUR APPOINTMENT. PHONE NUMBER IS [phone] OPTION 2   If you have any questions or concerns before your next appointment please send us  a message through [facility] or call our office at [phone].    TO LEAVE A MESSAGE FOR THE NURSE SELECT OPTION 2, PLEASE LEAVE A MESSAGE INCLUDING: YOUR NAME DATE OF BIRTH CALL BACK NUMBER REASON FOR CALL**this is important as we prioritize the call backs  YOU WILL RECEIVE A CALL BACK THE SAME DAY AS LONG AS YOU CALL BEFORE 4:00 PM  At the [facility], you and your health needs are our priority. As part of our continuing mission to provide you with exceptional heart care, we have created designated Provider Care Teams. These Care Teams include your primary Cardiologist (physician) and Advanced Practice Providers (APPs- Physician Assistants and Nurse Practitioners) who all work together to provide you with the care you need, when you need it.   You may see any of the following providers on your designated Care Team at your next follow up: Dr Toribio Fuel Dr Ezra Shuck Dr. Ria Commander Dr. Morene Brownie Amy Lenetta, NP Caffie Shed, [place] [facility] [place], [place] Beckey Coe, NP Jordan Lee, NP Tinnie Redman, PharmD   Please be sure to bring in all your medications bottles to every appointment.    Thank you for choosing [facility]

## 2024-01-15 NOTE — Progress Notes (Signed)
  Echocardiogram 2D Echocardiogram has been performed.  Sydney Shaffer 01/15/2024, 8:46 AM

## 2024-01-15 NOTE — Progress Notes (Signed)
 [facility] Note   PCP: Lum Dross, PA-C ([facility]) EP: Dr Fernande Medtronic ICD HF Cardiologist: Dr. Cherrie  HPI: Sydney Shaffer is a 58 y.o.female with chronic systolic HF due to NICM s/p Medtronic ICD, HTN, and SVT s/p ablation 2/1  Cath 4/13 and 10/17 and 7/24 normal coronaries.  Echo 6/19 EF 30-35% mild MR. S/p ICD gen change 12/20.  Reestablished with HF clinic in April 2023.    Echo 4/23: EF 25-30%, LV severely dilated, grade III DD, RV okay, RVSP 49 mmHg, moderate MR, mild to moderate TR   Admitted 7/23 with ADHF after being out of meds. Referred for paramedicine.  Amiodarone  increased d/t episodes of VT (monitor only) and runs SVT and AF.   CPX 4/24  FVC 1.67 (52%)      FEV1 1.37 (54%)        FEV1/FVC 82 (103%)        MVV 50 (49%)      BP rest: 88/68 ->  BP peak: 122/68  pVO2: 12.4 (55% predicted peak VO2) VE/VCO2 slope:  31  Peak RER: 1.12  VE/MVV:  79% (VE/FEV1*40 = 72%)   Echo  03/02/23 EF 20-25% RV mild HK   R/L cath 07/01/23 Normal coronaries Ao = 89/68 (80) LV = 105/28 RA = 4 RV = 47/6 PA = 46/20 (31) PCW = 19 (v = 25-30) Fick cardiac output/index =4.5/2.3 Thermo CP/CI = 3.9/2.0 PVR = 2.6 WU Ao sat = 91% PA sat = 62%, 64%  Returns for f/u. Doing well. Still following with Katie in Paramedicine. Occasional brief CP last 2 minutes and goes away. Mild DOE but able to do all her activities as needed.  No edema. No problems with meds.   Echo today 01/15/24 EF 25% + LVNC RV ok Personally reviewed   Cardiac Studies:  - Echo (4/23): EF 25-30% - Echo (10/17): EF 25-30%, Grade 1 DD - Echo (6/16): EF 30-35% mild MR - Echo (2/15): EF 20-25%  - Echo (7/13): EF 20-25%   R/LHC (10/17): normal cors  CPX (5/18)  Peak VO2: 12.3 (61% predicted peak VO2) VE/VCO2 slope:  25 Peak RER: 0.98  CPX (6/15):  Peak VO2: 12.6 (57.8% predicted peak VO2) VE/VCO2 slope: 28.6 Peak RER: 1.01   SH: She is not a smoker. She does not drink  alcohol. Lives alone. She has 3 grown children   FH: Mom breast cancer. Father cancer.   ROS: All systems negative except as listed in HPI, PMH and Problem List.  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    Chronic systolic heart failure (HCC)    Dysrhythmia    Bradycardia   Hypertension    Hypertensive heart disease with CHF (HCC)    ICD (implantable cardiac defibrillator) in place 08/13/2012   Menorrhagia    Metrorrhagia 06/04/2015   SVT (supraventricular tachycardia) (HCC) 03/30/2014   VT (ventricular tachycardia) (HCC) 07/22/2014   Current Outpatient Medications  Medication Sig Dispense Refill   acetaminophen  (TYLENOL ) 500 MG tablet Take 1,000 mg by mouth every 6 (six)  hours as needed for moderate pain.     amiodarone  (PACERONE ) 200 MG tablet TAKE 1/2 TABLET ( 100 MG ) BY MOUTH DAILIY 45 tablet 3   apixaban  (ELIQUIS ) 5 MG TABS tablet Take 1 tablet (5 mg total) by mouth 2 (two) times daily. 60 tablet 6   carvedilol  (COREG ) 3.125 MG tablet Take 1 tablet (3.125 mg total) by mouth 2 (two) times daily. 60 tablet 3   cyclobenzaprine  (FLEXERIL ) 10 MG tablet Take 1 tablet (10 mg total) by mouth 3 (three) times daily as needed for muscle spasms. 20 tablet 0   dapagliflozin  propanediol (FARXIGA ) 10 MG TABS tablet Take 1 tablet (10 mg total) by mouth daily. Needs appt for future refills 30 tablet 6   digoxin  (LANOXIN ) 0.125 MG tablet Take 1 tablet (0.125 mg total) by mouth daily. 90 tablet 3   doxylamine , Sleep, (UNISOM ) 25 MG tablet Take 25 mg by mouth at bedtime as needed for sleep.     ferrous sulfate  325 (65 FE) MG tablet Take  325 mg by mouth daily with breakfast.     losartan  (COZAAR ) 25 MG tablet Take 1 tablet (25 mg total) by mouth daily. (Patient taking differently: Take 25 mg by mouth at bedtime.) 30 tablet 3   Multiple Vitamin (MULTIVITAMIN WITH MINERALS) TABS Take 1 tablet by mouth daily.     potassium chloride  SA (KLOR-CON  M) 20 MEQ tablet Take 4 tablets (80 mEq total) by mouth every morning AND 4 tablets (80 mEq total) every evening. 240 tablet 11   spironolactone  (ALDACTONE ) 25 MG tablet Take 1 tablet (25 mg total) by mouth daily. Hold if systolic blood pressure less than 100. (Patient taking differently: Take 25 mg by mouth at bedtime. Hold if systolic blood pressure less than 100.) 45 tablet 3   torsemide  (DEMADEX ) 20 MG tablet TAKE 4 TABLETS BY MOUTH IN THE MORNING AND 2 TABLETS EVERY EVENING 545 tablet 2   No current facility-administered medications for this encounter.   BP 126/82   Pulse 76   Ht 5' 9 (1.753 m)   Wt 84.8 kg (187 lb)   SpO2 97%   BMI 27.62 kg/m   Wt Readings from Last 3 Encounters:  01/15/24 84.8 kg (187 lb)  01/07/24 86.6 kg (191 lb)  12/31/23 83.9 kg (185 lb)   Vitals:   01/15/24 0903  BP: 126/82  Pulse: 76  SpO2: 97%  Weight: 84.8 kg (187 lb)  Height: 5' 9 (1.753 m)    PHYSICAL EXAM: General:  Well appearing. No resp difficulty HEENT: normal Neck: supple. no JVD. Carotids 2+ bilat; no bruits. No lymphadenopathy or thryomegaly appreciated. Cor: PMI nondisplaced. Regular rate & rhythm. No rubs, gallops or murmurs. Lungs: clear Abdomen: soft, nontender, nondistended. No hepatosplenomegaly. No bruits or masses. Good bowel sounds. Extremities: no cyanosis, clubbing, rash, edema Neuro: alert & orientedx3, cranial nerves grossly intact. moves all 4 extremities w/o difficulty. Affect pleasant  Device interrogation: No VT/AF fluid ok activty level up to 2h/day Personally reviewed    ASSESSMENT & PLAN:  1. Chronic Systolic Heart Failure:  - Normal cors by cath 2013.  Nonischemic cardiomyopathy s/p Medtronic ICD.  - Echo (6/16): EF 30-35%.   - Echo (6/19): EF 30-35%. - Echo (4/23): EF 25-30% - Echo 03/02/23 EF 20-25% RV mild HK Personally reviewed -> echo concerning for LVNC will need cMRI - ICD generator change 11/14/19 - CPX 4/24 FVC 1.67 (52%) FEV1 1.37 (54%) FEV1/FVC 82 (103%) BP rest: 88/68 ->  BP peak:  122/68 pVO2: 12.4 (55% predicted peak VO2) VE/VCO2 31 pRER: 1.12 VE/MVV:  79% - R/L cath 07/01/23 normal cors RA 4 PA 46/20 (31) PCW 19 (v = 25-30) Fick 4.5/2.3 Thermo 3.9/2.0 PVR 2.6 WU - Echo today 01/15/24 EF 25% + LVNC RV ok Personally reviewed - Improved NYHA II-III. She has been very compliant with meds - Volume status looks good on exam and ICD Continue torsemide  80/40 - Increase losartan  to 25 bid - Continue spiro 25 mg daily - Continue Coreg  to 3.125 mg bid (dose decreased due to intolerance) - Continue Farxiga  10 mg daily - Continue digoxin  0.125 daily  - Continue Eliquis  in setting of LVNC and reduced EF  - Blood Type O - She is interested in advanced therapies (particularly transplant) but still a bit early.  - Repeat CPX if symptoms worsen - Check labs today                                                                                            -  2. SVT and VT  - s/p atrial tachycardia ablation in 01/2014.   - No VT on ICD interorgation tday - Continue amio 100 daily - Follows with Dr. Fernande - Amio labs today  3. SDOH - Has done very well with Paramedicine support. Compliance much improved.   Toribio Fuel, MD 01/15/2024

## 2024-01-19 ENCOUNTER — Telehealth (HOSPITAL_COMMUNITY): Payer: Self-pay | Admitting: *Deleted

## 2024-01-19 DIAGNOSIS — I5022 Chronic systolic (congestive) heart failure: Secondary | ICD-10-CM

## 2024-01-19 NOTE — Telephone Encounter (Signed)
Spoke w/pt, she is aware, agreeable, and verbalized understanding. She states she has been taking 3 tabs (60 meq) BID, she will take extra 80 meq (4 tabs) Today and will increase dose to 4 tabs (80 meq) BID, repeat lab sch

## 2024-01-19 NOTE — Telephone Encounter (Signed)
-----   Message from Arvilla Meres sent at 01/15/2024  6:39 PM EST ----- Regarding: FW: Please have her take kcl 80 x 1 then increase daily dose from 20 to 40.   Repeat bmet 1 week ----- Message ----- From: Leory Plowman, Lab In [place] Sent: 01/15/2024  10:45 AM EST To: Dolores Patty, MD

## 2024-01-20 ENCOUNTER — Telehealth (HOSPITAL_COMMUNITY): Payer: Self-pay

## 2024-01-20 NOTE — Telephone Encounter (Signed)
Reached out to pt to confirm our appoint tomor. She asked to resch for next week.  She had her apt pad locked but her rent got paid to get her back in her place, so she is going thru that and getting settled back in.   Will f/u next week.    Kerry Hough, EMT-Paramedic  [phone] 01/20/2024

## 2024-01-21 ENCOUNTER — Telehealth (HOSPITAL_COMMUNITY): Payer: Self-pay | Admitting: Licensed Clinical Social Worker

## 2024-01-21 NOTE — Telephone Encounter (Signed)
H&V Care Navigation CSW Progress Note  Clinical Social Worker got check from accounts payable yesterday and was able to get to landlord.  Spoke with patient today and she was able to get apartment unpadlocked and is back in.   Patient is participating in a Managed Medicaid Plan:  Yes  SDOH Screenings   Food Insecurity: No Food Insecurity (07/02/2022)  Housing: High Risk (01/12/2024)  Transportation Needs: No Transportation Needs (07/02/2022)  Depression (PHQ2-9): Medium Risk (05/14/2023)  Financial Resource Strain: High Risk (12/16/2023)  Tobacco Use: Low Risk  (01/15/2024)   Burna Sis, LCSW Clinical Social Worker [facility] Desk#: [phone] Cell#: [phone]

## 2024-01-28 ENCOUNTER — Telehealth (HOSPITAL_COMMUNITY): Payer: Self-pay | Admitting: Licensed Clinical Social Worker

## 2024-01-28 ENCOUNTER — Telehealth (HOSPITAL_COMMUNITY): Payer: Self-pay

## 2024-01-28 NOTE — Telephone Encounter (Signed)
H&V Care Navigation CSW Progress Note  Clinical Social Worker CSW informed by community paramedic that pt's electricity has been shut off due to lack of payment.  Pt had applied for [employer] but hadn't heard back yet- CSW instructed her to take turn off notice to [facility] to see if they can expedite her case with this new information.  Patient staying with her son while she has not electricity.  Will get her first social security check next week so will plan to pay off what she can at that time.  Patient is participating in a Managed Medicaid Plan:  Yes  SDOH Screenings   Food Insecurity: No Food Insecurity (07/02/2022)  Housing: High Risk (01/12/2024)  Transportation Needs: No Transportation Needs (07/02/2022)  Utilities: At Risk (01/28/2024)  Depression (PHQ2-9): Medium Risk (05/14/2023)  Financial Resource Strain: High Risk (12/16/2023)  Tobacco Use: Low Risk  (01/15/2024)   Burna Sis, LCSW Clinical Social Worker [facility] Desk#: [phone] Cell#: [phone]

## 2024-01-28 NOTE — Telephone Encounter (Signed)
Reached out to pt to confirm if today was still good for our appoint. She has had a lot go on with her apt.  She got back in her apt but the lights/power are not on yet. She is trying to get it paid down to get it turned back on.  She plans to have it back on by next week. Staying with her son right now.   Will f/u next week.   Kerry Hough, EMT-Paramedic  [phone] 01/28/2024

## 2024-01-29 ENCOUNTER — Other Ambulatory Visit (HOSPITAL_COMMUNITY): Payer: Medicaid Other

## 2024-02-01 ENCOUNTER — Telehealth (HOSPITAL_COMMUNITY): Payer: Self-pay | Admitting: Licensed Clinical Social Worker

## 2024-02-01 NOTE — Telephone Encounter (Signed)
 H&V Care Navigation CSW Progress Note  Clinical Social Worker received call from pt who wanted to update CSW that she was able to get lights turned back on last week after going to [facility] with updated turn off notice- pt expressed appreciation for help and guidance in the matter.  States no further needs at this time.   Patient is participating in a Managed Medicaid Plan:  Yes  SDOH Screenings   Food Insecurity: No Food Insecurity (07/02/2022)  Housing: High Risk (01/12/2024)  Transportation Needs: No Transportation Needs (07/02/2022)  Utilities: At Risk (01/28/2024)  Depression (PHQ2-9): Medium Risk (05/14/2023)  Financial Resource Strain: High Risk (12/16/2023)  Tobacco Use: Low Risk  (01/15/2024)   Burna Sis, LCSW Clinical Social Worker [facility] Desk#: [phone] Cell#: [phone]

## 2024-02-04 ENCOUNTER — Telehealth (HOSPITAL_COMMUNITY): Payer: Self-pay

## 2024-02-04 NOTE — Telephone Encounter (Signed)
 Pt running behind today and asked to resch appoint for Monday so I was able to do that. Will see her then.   Kerry Hough, EMT-Paramedic  [phone] 02/04/2024

## 2024-02-05 ENCOUNTER — Other Ambulatory Visit (HOSPITAL_COMMUNITY): Payer: Medicaid Other

## 2024-02-08 ENCOUNTER — Telehealth (HOSPITAL_COMMUNITY): Payer: Self-pay

## 2024-02-08 NOTE — Telephone Encounter (Signed)
 Pt reached out that she is behind sch and is stuck in traffic and will be late for appoint that we sch for today so she asked to resch for Wednesday so I was able to accommodate that.   Kerry Hough, EMT-Paramedic  [phone] 02/08/2024

## 2024-02-10 ENCOUNTER — Other Ambulatory Visit (HOSPITAL_COMMUNITY): Payer: Self-pay

## 2024-02-10 NOTE — Progress Notes (Signed)
 Paramedicine Encounter    Patient ID: Sydney Shaffer, female    DOB: 23-Feb-1966, 58 y.o.   MRN: [id number]   Complaints-tired sometimes, no new changes  Edema-slight   Compliance with meds-yes  Pill box filled-yes  If so, by whom-paramedic   Refills needed-none   Finally able to see her again, there has been a lot going on with her apt and living situation.  She is back in her apt, she now has disability income.  She reports feeling ok.  She was seen in clinic a few wks ago and her losartan was increased to BID.  She has been doing ok with that.  She reports dizziness improved, no increased sob, no c/p.  She does feel bloated, her weight is up from when she was seen in clinic last. No sob. Her appetite has been too good.  Last ECHO is 25% last month. Same as it was before.  Still wants to pursue transplant, but per dr bensimhon a bit early for now, will continue to watch.  Meds verified and pill box refilled.    BP 90/64   Pulse 82   Resp 16   Wt 193 lb (87.5 kg)   SpO2 97%   BMI 28.50 kg/m  Weight yesterday-? Last visit weight-187  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   Anemia 03/29/2012    Current Outpatient Medications:    acetaminophen (TYLENOL) 500 MG tablet, Take 1,000 mg by mouth every 6 (six) hours as needed for moderate pain., Disp: , Rfl:    amiodarone (PACERONE) 200 MG tablet, TAKE 1/2 TABLET ( 100 MG ) BY MOUTH DAILIY, Disp: 45 tablet, Rfl: 3   apixaban (ELIQUIS) 5 MG TABS tablet, Take 1 tablet (5 mg total) by mouth  2 (two) times daily., Disp: 60 tablet, Rfl: 6   carvedilol (COREG) 3.125 MG tablet, Take 1 tablet (3.125 mg total) by mouth 2 (two) times daily., Disp: 60 tablet, Rfl: 3   dapagliflozin propanediol (FARXIGA) 10 MG TABS tablet, Take 1 tablet (10 mg total) by mouth daily. Needs appt for future refills, Disp: 30 tablet, Rfl: 6   digoxin (LANOXIN) 0.125 MG tablet, Take 1 tablet (0.125 mg total) by mouth daily., Disp: 90 tablet, Rfl: 3   losartan (COZAAR) 25 MG tablet, Take 1 tablet (25 mg total) by mouth 2 (two) times daily., Disp: 60 tablet, Rfl: 3   potassium chloride SA (KLOR-CON M) 20 MEQ tablet, Take 4 tablets (80 mEq total) by mouth every morning AND 4 tablets (80 mEq total) every evening., Disp: 240 tablet, Rfl: 11   spironolactone (ALDACTONE) 25 MG tablet, TAKE 1 TABLET BY MOUTH DAILY. HOLD IF SYSTOLIC BLOOD PRESSURE LESS THAN 100, Disp: 45 tablet, Rfl: 3   torsemide (DEMADEX) 20 MG tablet, TAKE 4 TABLETS BY MOUTH IN THE MORNING AND 2 TABLETS EVERY EVENING, Disp: 545 tablet, Rfl: 2   cyclobenzaprine (FLEXERIL) 10 MG tablet, Take 1 tablet (10 mg total) by mouth 3 (three) times daily as needed for muscle spasms., Disp: 20 tablet, Rfl: 0   doxylamine, Sleep, (UNISOM) 25 MG tablet, Take 25 mg by  mouth at bedtime as needed for sleep., Disp: , Rfl:    ferrous sulfate 325 (65 FE) MG tablet, Take 325 mg by mouth daily with breakfast., Disp: , Rfl:    Multiple Vitamin (MULTIVITAMIN WITH MINERALS) TABS, Take 1 tablet by mouth daily., Disp: , Rfl:  No Known Allergies    Social History   Socioeconomic History   Marital status: Single    Spouse name: Not on file   Number of children: 3   Years of education: Not on file   Highest education level: Not on file  Occupational History   Occupation: receptionist    Employer: [employer]   Occupation: office cleaning  Tobacco Use   Smoking status: Never   Smokeless tobacco: Never  Vaping Use   Vaping status: Never Used  Substance and Sexual Activity    Alcohol use: Yes    Alcohol/week: 5.0 standard drinks of alcohol    Types: 5 Glasses of wine per week    Comment: daily   Drug use: Never   Sexual activity: Yes    Birth control/protection: Condom  Other Topics Concern   Not on file  Social History Narrative   Not on file   Social Drivers of Health   Financial Resource Strain: High Risk (12/16/2023)   Overall Financial Resource Strain (CARDIA)    Difficulty of Paying Living Expenses: Very hard  Food Insecurity: No Food Insecurity (07/02/2022)   Hunger Vital Sign    Worried About Running Out of Food in the Last Year: Never true    Ran Out of Food in the Last Year: Never true  Transportation Needs: No Transportation Needs (07/02/2022)   PRAPARE - Administrator, Civil Service (Medical): No    Lack of Transportation (Non-Medical): No  Physical Activity: Not on file  Stress: Not on file  Social Connections: Not on file  Intimate Partner Violence: Not on file    Physical Exam      Future Appointments  Date Time Provider Department Center  02/12/2024  3:15 PM [facility] LAB [facility] None  03/14/2024  3:05 PM [facility] DEVICE REMOTES [facility]       Kerry Hough, Paramedic [phone] [facility] Paramedic  02/10/24

## 2024-02-12 ENCOUNTER — Ambulatory Visit (HOSPITAL_COMMUNITY)
Admission: RE | Admit: 2024-02-12 | Discharge: 2024-02-12 | Disposition: A | Discharge status: Home / Self Care | Payer: Medicaid Other | Source: Ambulatory Visit | Attending: Internal Medicine | Admitting: Internal Medicine

## 2024-02-12 DIAGNOSIS — I5022 Chronic systolic (congestive) heart failure: Secondary | ICD-10-CM | POA: Insufficient documentation

## 2024-02-12 LAB — BASIC METABOLIC PANEL
Anion gap: 5 (ref 5–15)
BUN: 22 mg/dL — ABNORMAL HIGH (ref 6–20)
CO2: 27 mmol/L (ref 22–32)
Calcium: 9.4 mg/dL (ref 8.9–10.3)
Chloride: 103 mmol/L (ref 98–111)
Creatinine, Ser: 1.1 mg/dL — ABNORMAL HIGH (ref 0.44–1.00)
GFR, Estimated: 58 mL/min — ABNORMAL LOW (ref >60)
Glucose, Bld: 109 mg/dL — ABNORMAL HIGH (ref 70–99)
Potassium: 3.7 mmol/L (ref 3.5–5.1)
Sodium: 135 mmol/L (ref 135–145)

## 2024-02-18 ENCOUNTER — Telehealth (HOSPITAL_COMMUNITY): Payer: Self-pay

## 2024-02-18 NOTE — Telephone Encounter (Signed)
 Reached out to pt about visit this week and when she is avail other than thurs since I had a meeting and another pt prior to her pref time,  come Thursday she did not respond back.  Will try again next week.   Kerry Hough, EMT-Paramedic  [phone] 02/18/2024

## 2024-02-22 ENCOUNTER — Other Ambulatory Visit: Payer: Self-pay

## 2024-02-25 ENCOUNTER — Other Ambulatory Visit (HOSPITAL_COMMUNITY): Payer: Self-pay

## 2024-02-25 NOTE — Progress Notes (Addendum)
 Paramedicine Encounter    Patient ID: Sydney Shaffer, female    DOB: Jun 18, 1966, 58 y.o.   MRN: [id number]   Complaints-none   Edema-slight/bloated  Compliance with meds-yes  Pill box filled-yes  If so, by whom-paramedic   Refills needed-none   Pt reports she is doing good. She has been feeling good. Still looking for another apt. She has not moved all her things back to old apt yet. She wants to move closer to her son which lives [place]. There are a few places around there we talked about and she is going to try to go by there. Staying with son a lot also.   She does have slight swelling to her and feels a little bloated Eating and drinking ok.   Meds verified and pill box refilled.   We talked about the bubble packs and since we miss visits sometimes this would be a great way to keep up with meds. She got confused on her torsemide and has been taking 3 tabs in the PM dose instead of the 2.  Weight is coming down though but returned to the 2 tabs.   Wants bubble packs -will start on that next visit  BP 110/72   Pulse 85   Resp 16   Wt 190 lb (86.2 kg)   SpO2 96%   BMI 28.06 kg/m  Weight yesterday-190 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   Anemia 03/29/2012    Current Outpatient Medications:    acetaminophen (TYLENOL) 500 MG tablet, Take 1,000 mg by mouth every 6 (six) hours as needed for moderate pain., Disp: , Rfl:    amiodarone (PACERONE) 200 MG tablet, TAKE 1/2 TABLET ( 100 MG ) BY  MOUTH DAILIY, Disp: 45 tablet, Rfl: 3   apixaban (ELIQUIS) 5 MG TABS tablet, Take 1 tablet (5 mg total) by mouth 2 (two) times daily., Disp: 60 tablet, Rfl: 6   carvedilol (COREG) 3.125 MG tablet, Take 1 tablet (3.125 mg total) by mouth 2 (two) times daily., Disp: 60 tablet, Rfl: 3   dapagliflozin propanediol (FARXIGA) 10 MG TABS tablet, Take 1 tablet (10 mg total) by mouth daily. Needs appt for future refills, Disp: 30 tablet, Rfl: 6   digoxin (LANOXIN) 0.125 MG tablet, Take 1 tablet (0.125 mg total) by mouth daily., Disp: 90 tablet, Rfl: 3   losartan (COZAAR) 25 MG tablet, Take 1 tablet (25 mg total) by mouth 2 (two) times daily., Disp: 60 tablet, Rfl: 3   Multiple Vitamin (MULTIVITAMIN WITH MINERALS) TABS, Take 1 tablet by mouth daily., Disp: , Rfl:    potassium chloride SA (KLOR-CON M) 20 MEQ tablet, Take 4 tablets (80 mEq total) by mouth every morning AND 4 tablets (80 mEq total) every evening., Disp: 240 tablet, Rfl: 11   spironolactone (ALDACTONE) 25 MG tablet, TAKE 1 TABLET BY MOUTH DAILY. HOLD IF SYSTOLIC BLOOD PRESSURE LESS THAN 100, Disp: 45 tablet, Rfl: 3   torsemide (DEMADEX) 20 MG tablet, TAKE 4 TABLETS BY MOUTH IN THE MORNING AND 2 TABLETS EVERY EVENING, Disp: 545 tablet, Rfl: 2  cyclobenzaprine (FLEXERIL) 10 MG tablet, Take 1 tablet (10 mg total) by mouth 3 (three) times daily as needed for muscle spasms., Disp: 20 tablet, Rfl: 0   doxylamine, Sleep, (UNISOM) 25 MG tablet, Take 25 mg by mouth at bedtime as needed for sleep., Disp: , Rfl:    ferrous sulfate 325 (65 FE) MG tablet, Take 325 mg by mouth daily with breakfast., Disp: , Rfl:  No Known Allergies    Social History   Socioeconomic History   Marital status: Single    Spouse name: Not on file   Number of children: 3   Years of education: Not on file   Highest education level: Not on file  Occupational History   Occupation: Database administrator: [employer]   Occupation: office cleaning  Tobacco Use   Smoking  status: Never   Smokeless tobacco: Never  Vaping Use   Vaping status: Never Used  Substance and Sexual Activity   Alcohol use: Yes    Alcohol/week: 5.0 standard drinks of alcohol    Types: 5 Glasses of wine per week    Comment: daily   Drug use: Never   Sexual activity: Yes    Birth control/protection: Condom  Other Topics Concern   Not on file  Social History Narrative   Not on file   Social Drivers of Health   Financial Resource Strain: High Risk (12/16/2023)   Overall Financial Resource Strain (CARDIA)    Difficulty of Paying Living Expenses: Very hard  Food Insecurity: No Food Insecurity (07/02/2022)   Hunger Vital Sign    Worried About Running Out of Food in the Last Year: Never true    Ran Out of Food in the Last Year: Never true  Transportation Needs: No Transportation Needs (07/02/2022)   PRAPARE - Administrator, Civil Service (Medical): No    Lack of Transportation (Non-Medical): No  Physical Activity: Not on file  Stress: Not on file  Social Connections: Not on file  Intimate Partner Violence: Not on file    Physical Exam      Future Appointments  Date Time Provider Department Center  03/14/2024  3:05 PM [facility] DEVICE REMOTES [facility]       Kerry Hough, Paramedic [phone] [facility] Paramedic  02/25/24

## 2024-03-03 ENCOUNTER — Other Ambulatory Visit (HOSPITAL_COMMUNITY): Payer: Self-pay

## 2024-03-03 NOTE — Progress Notes (Signed)
 Paramedicine Encounter    Patient ID: Sydney Shaffer, female    DOB: September 06, 1966, 58 y.o.   MRN: [id number]   Complaints-tired, sob last night, chills -better today   Edema-bloated, some ankle/leg edema   Compliance with meds-yes  Pill box filled-yes If so, by whom-paramedic   Refills needed-transition to bubble packs this week    Pt reports that she is slowly moving things back to her apt. She is not having any luck with any openings to any other apt and they all have a long wait list.  She reports yesterday she didn't feel well,  increased sob, chills/feeling tired, but today she does feel some better.  She thinks its b/c she was doing way more recently with moving etc and she just got tired out.  She is up 2 lbs from last week. No missed doses of her meds.  She does report that she has been eating A LOT of ice. So we talked about that and she really needs to slow that down. She is getting too much fluids.  No c/p, no dizziness.  She is to see clinic again in 4-6 months from 2/25 so will reach out in may to get something sch. Advised her if she feels she needs to be seen sooner then that can def be arranged.   Meds verified and pill box refilled.  Taking her meds to pharm so they can start the bubble packs for next week.    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   Anemia 03/29/2012    Current Outpatient  Medications:    acetaminophen (TYLENOL) 500 MG tablet, Take 1,000 mg by mouth every 6 (six) hours as needed for moderate pain., Disp: , Rfl:    amiodarone (PACERONE) 200 MG tablet, TAKE 1/2 TABLET ( 100 MG ) BY MOUTH DAILIY, Disp: 45 tablet, Rfl: 3   apixaban (ELIQUIS) 5 MG TABS tablet, Take 1 tablet (5 mg total) by mouth 2 (two) times daily., Disp: 60 tablet, Rfl: 6   carvedilol (COREG) 3.125 MG tablet, Take 1 tablet (3.125 mg total) by mouth 2 (two) times daily., Disp: 60 tablet, Rfl: 3   dapagliflozin propanediol (FARXIGA) 10 MG TABS tablet, Take 1 tablet (10 mg total) by mouth daily. Needs appt for future refills, Disp: 30 tablet, Rfl: 6   digoxin (LANOXIN) 0.125 MG tablet, Take 1 tablet (0.125 mg total) by mouth daily., Disp: 90 tablet, Rfl: 3   losartan (COZAAR) 25 MG tablet, Take 1 tablet (25 mg total) by mouth 2 (two) times daily., Disp: 60 tablet, Rfl: 3   potassium chloride SA (KLOR-CON M) 20 MEQ tablet, Take 4 tablets (80 mEq total) by mouth every morning AND 4 tablets (80 mEq total) every evening., Disp: 240 tablet, Rfl: 11   spironolactone (ALDACTONE) 25 MG tablet, TAKE 1 TABLET BY MOUTH DAILY. HOLD IF SYSTOLIC BLOOD PRESSURE LESS THAN 100, Disp: 45 tablet, Rfl: 3  torsemide (DEMADEX) 20 MG tablet, TAKE 4 TABLETS BY MOUTH IN THE MORNING AND 2 TABLETS EVERY EVENING, Disp: 545 tablet, Rfl: 2   cyclobenzaprine (FLEXERIL) 10 MG tablet, Take 1 tablet (10 mg total) by mouth 3 (three) times daily as needed for muscle spasms., Disp: 20 tablet, Rfl: 0   doxylamine, Sleep, (UNISOM) 25 MG tablet, Take 25 mg by mouth at bedtime as needed for sleep., Disp: , Rfl:    ferrous sulfate 325 (65 FE) MG tablet, Take 325 mg by mouth daily with breakfast., Disp: , Rfl:    Multiple Vitamin (MULTIVITAMIN WITH MINERALS) TABS, Take 1 tablet by mouth daily., Disp: , Rfl:  No Known Allergies    Social History   Socioeconomic History   Marital status: Single    Spouse name: Not on file   Number of children:  3   Years of education: Not on file   Highest education level: Not on file  Occupational History   Occupation: receptionist    Employer: [employer]   Occupation: office cleaning  Tobacco Use   Smoking status: Never   Smokeless tobacco: Never  Vaping Use   Vaping status: Never Used  Substance and Sexual Activity   Alcohol use: Yes    Alcohol/week: 5.0 standard drinks of alcohol    Types: 5 Glasses of wine per week    Comment: daily   Drug use: Never   Sexual activity: Yes    Birth control/protection: Condom  Other Topics Concern   Not on file  Social History Narrative   Not on file   Social Drivers of Health   Financial Resource Strain: High Risk (12/16/2023)   Overall Financial Resource Strain (CARDIA)    Difficulty of Paying Living Expenses: Very hard  Food Insecurity: No Food Insecurity (07/02/2022)   Hunger Vital Sign    Worried About Running Out of Food in the Last Year: Never true    Ran Out of Food in the Last Year: Never true  Transportation Needs: No Transportation Needs (07/02/2022)   PRAPARE - Administrator, Civil Service (Medical): No    Lack of Transportation (Non-Medical): No  Physical Activity: Not on file  Stress: Not on file  Social Connections: Not on file  Intimate Partner Violence: Not on file    Physical Exam      Future Appointments  Date Time Provider Department Center  03/14/2024  3:05 PM [facility] DEVICE REMOTES [facility]       Kerry Hough, Paramedic [phone] [facility] Paramedic  03/03/24

## 2024-03-07 ENCOUNTER — Other Ambulatory Visit (HOSPITAL_COMMUNITY): Payer: Self-pay

## 2024-03-07 NOTE — Progress Notes (Signed)
 I took her meds to [facility] so he can place them in bubble packs.  He will let me know when ready and I will p/u to ensure accuracy and show her the use, which is very simple.   Kerry Hough, EMT-Paramedic  [phone] 03/07/2024

## 2024-03-08 ENCOUNTER — Other Ambulatory Visit (HOSPITAL_COMMUNITY): Payer: Self-pay | Admitting: Internal Medicine

## 2024-03-10 ENCOUNTER — Emergency Department (HOSPITAL_COMMUNITY)

## 2024-03-10 ENCOUNTER — Encounter (HOSPITAL_COMMUNITY): Payer: Self-pay | Admitting: Emergency Medicine

## 2024-03-10 ENCOUNTER — Other Ambulatory Visit: Payer: Self-pay

## 2024-03-10 ENCOUNTER — Emergency Department (HOSPITAL_COMMUNITY)
Admission: EM | Admit: 2024-03-10 | Discharge: 2024-03-10 | Disposition: A | Discharge status: Home / Self Care | Attending: Emergency Medicine | Admitting: Emergency Medicine

## 2024-03-10 ENCOUNTER — Other Ambulatory Visit (HOSPITAL_COMMUNITY): Payer: Self-pay

## 2024-03-10 DIAGNOSIS — R42 Dizziness and giddiness: Secondary | ICD-10-CM | POA: Diagnosis not present

## 2024-03-10 DIAGNOSIS — I509 Heart failure, unspecified: Secondary | ICD-10-CM | POA: Diagnosis not present

## 2024-03-10 DIAGNOSIS — R109 Unspecified abdominal pain: Secondary | ICD-10-CM | POA: Insufficient documentation

## 2024-03-10 DIAGNOSIS — I499 Cardiac arrhythmia, unspecified: Secondary | ICD-10-CM | POA: Diagnosis not present

## 2024-03-10 DIAGNOSIS — Z7901 Long term (current) use of anticoagulants: Secondary | ICD-10-CM | POA: Diagnosis not present

## 2024-03-10 DIAGNOSIS — R531 Weakness: Secondary | ICD-10-CM | POA: Insufficient documentation

## 2024-03-10 DIAGNOSIS — I110 Hypertensive heart disease with heart failure: Secondary | ICD-10-CM | POA: Diagnosis not present

## 2024-03-10 DIAGNOSIS — Z79899 Other long term (current) drug therapy: Secondary | ICD-10-CM | POA: Insufficient documentation

## 2024-03-10 DIAGNOSIS — R000 Tachycardia, unspecified: Secondary | ICD-10-CM | POA: Diagnosis not present

## 2024-03-10 DIAGNOSIS — R001 Bradycardia, unspecified: Secondary | ICD-10-CM | POA: Diagnosis not present

## 2024-03-10 DIAGNOSIS — I498 Other specified cardiac arrhythmias: Secondary | ICD-10-CM

## 2024-03-10 DIAGNOSIS — R002 Palpitations: Secondary | ICD-10-CM | POA: Insufficient documentation

## 2024-03-10 DIAGNOSIS — I517 Cardiomegaly: Secondary | ICD-10-CM | POA: Diagnosis not present

## 2024-03-10 LAB — TROPONIN I (HIGH SENSITIVITY)
Troponin I (High Sensitivity): 6 ng/L (ref <18)
Troponin I (High Sensitivity): 8 ng/L (ref <18)

## 2024-03-10 LAB — CBC WITH DIFFERENTIAL/PLATELET
Abs Immature Granulocytes: 0.01 10*3/uL (ref 0.00–0.07)
Basophils Absolute: 0 10*3/uL (ref 0.0–0.1)
Basophils Relative: 1 %
Eosinophils Absolute: 0.2 10*3/uL (ref 0.0–0.5)
Eosinophils Relative: 4 %
HCT: 40 % (ref 36.0–46.0)
Hemoglobin: 13.2 g/dL (ref 12.0–15.0)
Immature Granulocytes: 0 %
Lymphocytes Relative: 36 %
Lymphs Abs: 2 10*3/uL (ref 0.7–4.0)
MCH: 28.2 pg (ref 26.0–34.0)
MCHC: 33 g/dL (ref 30.0–36.0)
MCV: 85.5 fL (ref 80.0–100.0)
Monocytes Absolute: 0.5 10*3/uL (ref 0.1–1.0)
Monocytes Relative: 9 %
Neutro Abs: 2.9 10*3/uL (ref 1.7–7.7)
Neutrophils Relative %: 50 %
Platelets: 246 10*3/uL (ref 150–400)
RBC: 4.68 MIL/uL (ref 3.87–5.11)
RDW: 14 % (ref 11.5–15.5)
WBC: 5.7 10*3/uL (ref 4.0–10.5)
nRBC: 0 % (ref 0.0–0.2)

## 2024-03-10 LAB — COMPREHENSIVE METABOLIC PANEL WITH GFR
ALT: 20 U/L (ref 0–44)
AST: 24 U/L (ref 15–41)
Albumin: 3.8 g/dL (ref 3.5–5.0)
Alkaline Phosphatase: 88 U/L (ref 38–126)
Anion gap: 9 (ref 5–15)
BUN: 14 mg/dL (ref 6–20)
CO2: 25 mmol/L (ref 22–32)
Calcium: 9.4 mg/dL (ref 8.9–10.3)
Chloride: 104 mmol/L (ref 98–111)
Creatinine, Ser: 1.05 mg/dL — ABNORMAL HIGH (ref 0.44–1.00)
GFR, Estimated: >60 mL/min (ref >60)
Glucose, Bld: 93 mg/dL (ref 70–99)
Potassium: 3.4 mmol/L — ABNORMAL LOW (ref 3.5–5.1)
Sodium: 138 mmol/L (ref 135–145)
Total Bilirubin: 0.7 mg/dL (ref 0.0–1.2)
Total Protein: 7.3 g/dL (ref 6.5–8.1)

## 2024-03-10 LAB — MAGNESIUM: Magnesium: 2.2 mg/dL (ref 1.7–2.4)

## 2024-03-10 LAB — DIGOXIN LEVEL: Digoxin Level: 0.4 ng/mL — ABNORMAL LOW (ref 0.8–2.0)

## 2024-03-10 LAB — BRAIN NATRIURETIC PEPTIDE: B Natriuretic Peptide: 273.1 pg/mL — ABNORMAL HIGH (ref 0.0–100.0)

## 2024-03-10 MED ORDER — POTASSIUM CHLORIDE 20 MEQ PO PACK
20.0000 meq | PACK | Freq: Once | ORAL | Status: AC
  Administered 2024-03-10: 20 meq via ORAL
  Filled 2024-03-10: qty 1

## 2024-03-10 NOTE — Progress Notes (Signed)
 Paramedicine Encounter    Patient ID: Sydney Shaffer, female    DOB: 14-Nov-1966, 58 y.o.   MRN: [id number]   Complaints-tired/weak/run down   Edema-  Compliance with meds-yes  Pill box filled-now has bubble packs  If so, by whom=-pharmacy   Refills needed-none   Pt reports not feeling well starting today. She just feels tired/worn out. Pt reports some c/p earlier in the day and short of breath also.  Upon checking v/s her HR was 40bpm.  I did EKG and she was in bigeminy at electrical rate around 78-85. She would have runs of this and then return to NSR and keep going back and forth for the entirety of the visit. Pt reports she is feeling quite poorly and on a scale 8/10. She is agreeable to be txp to ER via EMS.  Attempted some 02 to see if this decreased the frequency and I didn't see a noticeable difference. IV started.  EMS arrived and continued ALS care en route to hosp.  Sent message to triage as FYI and to see if they can let provider aware also.  Lungs were clear but seemed to have a "rub" sound to the lower lobes. Denies any recent illnesses or fevers.   I went by [facility] to get her bubble packs and I took them to her and showed her usage.    Only thing she has to take is her OTC iron and multi vitamin.    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   Anemia 03/29/2012    Current Outpatient Medications:    acetaminophen (TYLENOL) 500 MG  tablet, Take 1,000 mg by mouth every 6 (six) hours as needed for moderate pain., Disp: , Rfl:    amiodarone (PACERONE) 200 MG tablet, TAKE 1/2 TABLET ( 100 MG ) BY MOUTH DAILIY, Disp: 45 tablet, Rfl: 3   carvedilol (COREG) 3.125 MG tablet, TAKE ONE TABLET BY MOUTH TWICE DAILY (AM+BEDTIME), Disp: 60 tablet, Rfl: 3   cyclobenzaprine (FLEXERIL) 10 MG tablet, Take 1 tablet (10 mg total) by mouth 3 (three) times daily as needed for muscle spasms., Disp: 20 tablet, Rfl: 0   dapagliflozin propanediol (FARXIGA) 10 MG TABS tablet, Take 1 tablet (10 mg total) by mouth daily. Needs appt for future refills, Disp: 30 tablet, Rfl: 6   digoxin (LANOXIN) 0.125 MG tablet, TAKE 1 TABLET BY MOUTH DAILY, Disp: 90 tablet, Rfl: 3   doxylamine, Sleep, (UNISOM) 25 MG tablet, Take 25 mg by mouth at bedtime as needed for sleep., Disp: , Rfl:    ELIQUIS 5 MG TABS tablet, TAKE ONE TABLET BY MOUTH TWICE DAILY (AM+BEDTIME), Disp: 60 tablet, Rfl: 6   ferrous sulfate 325 (65 FE) MG tablet, Take 325 mg by mouth daily with breakfast., Disp: , Rfl:    losartan (COZAAR) 25 MG tablet, Take 1 tablet (25 mg total) by mouth 2 (two) times daily., Disp: 60 tablet, Rfl: 3   Multiple Vitamin (MULTIVITAMIN WITH MINERALS) TABS, Take 1  tablet by mouth daily., Disp: , Rfl:    potassium chloride SA (KLOR-CON M) 20 MEQ tablet, Take 4 tablets (80 mEq total) by mouth every morning AND 4 tablets (80 mEq total) every evening., Disp: 240 tablet, Rfl: 11   spironolactone (ALDACTONE) 25 MG tablet, TAKE 1 TABLET BY MOUTH DAILY. HOLD IF SYSTOLIC BLOOD PRESSURE LESS THAN 100, Disp: 45 tablet, Rfl: 3   torsemide (DEMADEX) 20 MG tablet, TAKE 4 TABLETS BY MOUTH IN THE MORNING AND 2 TABLETS EVERY EVENING, Disp: 545 tablet, Rfl: 2 No Known Allergies    Social History   Socioeconomic History   Marital status: Single    Spouse name: Not on file   Number of children: 3   Years of education: Not on file   Highest education level: Not on file  Occupational  History   Occupation: receptionist    Employer: [employer]   Occupation: office cleaning  Tobacco Use   Smoking status: Never   Smokeless tobacco: Never  Vaping Use   Vaping status: Never Used  Substance and Sexual Activity   Alcohol use: Yes    Alcohol/week: 5.0 standard drinks of alcohol    Types: 5 Glasses of wine per week    Comment: daily   Drug use: Never   Sexual activity: Yes    Birth control/protection: Condom  Other Topics Concern   Not on file  Social History Narrative   Not on file   Social Drivers of Health   Financial Resource Strain: High Risk (12/16/2023)   Overall Financial Resource Strain (CARDIA)    Difficulty of Paying Living Expenses: Very hard  Food Insecurity: No Food Insecurity (07/02/2022)   Hunger Vital Sign    Worried About Running Out of Food in the Last Year: Never true    Ran Out of Food in the Last Year: Never true  Transportation Needs: No Transportation Needs (07/02/2022)   PRAPARE - Administrator, Civil Service (Medical): No    Lack of Transportation (Non-Medical): No  Physical Activity: Not on file  Stress: Not on file  Social Connections: Not on file  Intimate Partner Violence: Not on file    Physical Exam      Future Appointments  Date Time Provider Department Center  03/10/2024  5:25 PM [facility]  03/14/2024  3:05 PM [facility] DEVICE REMOTES [facility]       Kerry Hough, Paramedic [phone] [facility] Paramedic  03/10/24

## 2024-03-10 NOTE — ED Provider Notes (Signed)
 Sydney Shaffer EMERGENCY DEPARTMENT AT [facility] Provider Note   CSN: [id number] Arrival date & time: 03/10/24  1611     History {Add pertinent medical, surgical, social history, OB history to HPI:1} No chief complaint on file.   Sydney Shaffer is a 58 y.o. female with PMHx HTN, CHF, anemia who was seen today during routine checkup for her CHF and was reporting recent weakness and dizziness.  Patient was in intermittent bigeminy with EMS.  Endorses some palpitations over the left chest.  HPI     Home Medications Prior to Admission medications   Medication Sig Start Date End Date Taking? Authorizing Provider  acetaminophen (TYLENOL) 500 MG tablet Take 1,000 mg by mouth every 6 (six) hours as needed for moderate pain.    [provider]  amiodarone (PACERONE) 200 MG tablet TAKE 1/2 TABLET ( 100 MG ) BY MOUTH DAILIY 11/04/23   Bensimhon, Bevelyn Buckles, MD  carvedilol (COREG) 3.125 MG tablet TAKE ONE TABLET BY MOUTH TWICE DAILY (AM+BEDTIME) 03/08/24   Bensimhon, Bevelyn Buckles, MD  cyclobenzaprine (FLEXERIL) 10 MG tablet Take 1 tablet (10 mg total) by mouth 3 (three) times daily as needed for muscle spasms. 05/14/23   Arnette Felts, FNP  dapagliflozin propanediol (FARXIGA) 10 MG TABS tablet Take 1 tablet (10 mg total) by mouth daily. Needs appt for future refills 09/15/23   Jacklynn Ganong, FNP  digoxin (LANOXIN) 0.125 MG tablet TAKE 1 TABLET BY MOUTH DAILY 03/08/24   Bensimhon, Bevelyn Buckles, MD  doxylamine, Sleep, (UNISOM) 25 MG tablet Take 25 mg by mouth at bedtime as needed for sleep. 04/16/21   [provider]  ELIQUIS 5 MG TABS tablet TAKE ONE TABLET BY MOUTH TWICE DAILY (AM+BEDTIME) 03/08/24   Bensimhon, Bevelyn Buckles, MD  ferrous sulfate 325 (65 FE) MG tablet Take 325 mg by mouth daily with breakfast.    [provider]  losartan (COZAAR) 25 MG tablet Take 1 tablet (25 mg total) by mouth 2 (two) times daily. 01/15/24   Bensimhon, Bevelyn Buckles, MD  Multiple Vitamin (MULTIVITAMIN  WITH MINERALS) TABS Take 1 tablet by mouth daily.    [provider]  potassium chloride SA (KLOR-CON M) 20 MEQ tablet Take 4 tablets (80 mEq total) by mouth every morning AND 4 tablets (80 mEq total) every evening. 06/18/23   Bensimhon, Bevelyn Buckles, MD  spironolactone (ALDACTONE) 25 MG tablet TAKE 1 TABLET BY MOUTH DAILY. HOLD IF SYSTOLIC BLOOD PRESSURE LESS THAN 100 01/15/24   Milford, Anderson Malta, FNP  torsemide (DEMADEX) 20 MG tablet TAKE 4 TABLETS BY MOUTH IN THE MORNING AND 2 TABLETS EVERY EVENING 08/06/23   Bensimhon, Bevelyn Buckles, MD      Allergies    Patient has no known allergies.    Review of Systems   Review of Systems  Physical Exam Updated Vital Signs BP 111/69   Pulse 79   Temp 98.1 F (36.7 C) (Oral)   Resp 19   Ht 5\' 6"  (1.676 m)   Wt 86.2 kg   SpO2 95%   BMI 30.67 kg/m  Physical Exam  ED Results / Procedures / Treatments   Labs (all labs ordered are listed, but only abnormal results are displayed) Labs Reviewed - No data to display  EKG None  Radiology No results found.  Procedures Procedures  {Document cardiac monitor, telemetry assessment procedure when appropriate:1}  Medications Ordered in ED Medications - No data to display  ED Course/ Medical Decision Making/ A&P   {  Click here for ABCD2, HEART and other calculatorsREFRESH Note before signing :1}                              Medical Decision Making  ***  Differential diagnosis includes electrolyte or metabolic derangement, digoxin deficiency, volume overload in the setting of CHF  {Document critical care time when appropriate:1} {Document review of labs and clinical decision tools ie heart score, Chads2Vasc2 etc:1}  {Document your independent review of radiology images, and any outside records:1} {Document your discussion with family members, caretakers, and with consultants:1} {Document social determinants of health affecting pt's care:1} {Document your decision making why or why not  admission, treatments were needed:1} Final Clinical Impression(s) / ED Diagnoses Final diagnoses:  None    Rx / DC Orders ED Discharge Orders     None      Renella Cunas, PGY-2 Emergency Medicine

## 2024-03-10 NOTE — ED Triage Notes (Signed)
 Pt BIB [facility] from home due to EMS doing routine check up on CHF patients.  Pt reported weakness and dizziness. Hx CHF.  Per [facility] patient was in bigeminy but other times NSR.   VS BP 130/80, HR 40

## 2024-03-10 NOTE — Discharge Instructions (Addendum)
 You were seen in the department for bigeminy.  Your workup was reassuring.  We interrogated your Medtronics device which was reassuring.  Your electrolytes were all appropriate.  You do not have too much digoxin which can be a cause of the symptoms.  Please follow-up with your cardiologist as needed.

## 2024-03-12 LAB — CUP PACEART REMOTE DEVICE CHECK
Battery Remaining Longevity: 83 mo
Battery Voltage: 2.99 V
Brady Statistic AP VP Percent: 0 %
Brady Statistic AP VS Percent: 0.74 %
Brady Statistic AS VP Percent: 0 %
Brady Statistic AS VS Percent: 99.26 %
Brady Statistic RA Percent Paced: 0.74 %
Brady Statistic RV Percent Paced: 0 %
Date Time Interrogation Session: 20250403201103
HighPow Impedance: 79 Ohm
Implantable Lead Connection Status: 753985
Implantable Lead Connection Status: 753985
Implantable Lead Implant Date: 20130906
Implantable Lead Implant Date: 20130906
Implantable Lead Location: 753859
Implantable Lead Location: 753860
Implantable Lead Model: 181
Implantable Lead Model: 5076
Implantable Lead Serial Number: 322962
Implantable Pulse Generator Implant Date: 20201207
Lead Channel Impedance Value: 399 Ohm
Lead Channel Impedance Value: 399 Ohm
Lead Channel Impedance Value: 418 Ohm
Lead Channel Pacing Threshold Amplitude: 0.5 V
Lead Channel Pacing Threshold Amplitude: 1.625 V
Lead Channel Pacing Threshold Pulse Width: 0.4 ms
Lead Channel Pacing Threshold Pulse Width: 0.4 ms
Lead Channel Sensing Intrinsic Amplitude: 10.625 mV
Lead Channel Sensing Intrinsic Amplitude: 10.625 mV
Lead Channel Sensing Intrinsic Amplitude: 2.375 mV
Lead Channel Sensing Intrinsic Amplitude: 2.375 mV
Lead Channel Setting Pacing Amplitude: 1.5 V
Lead Channel Setting Pacing Amplitude: 2.5 V
Lead Channel Setting Pacing Pulse Width: 1 ms
Lead Channel Setting Sensing Sensitivity: 0.3 mV
Zone Setting Status: 755011

## 2024-03-17 ENCOUNTER — Other Ambulatory Visit (HOSPITAL_COMMUNITY): Payer: Self-pay

## 2024-03-17 NOTE — Progress Notes (Signed)
 Paramedicine Encounter    Patient ID: Sydney Shaffer, female    DOB: August 18, 1966, 58 y.o.   MRN: [id number]   Complaints-none   Edema-slight to legs   Compliance with meds-yes  Pill box filled-n/a If so, by whom-bubble packs   Refills needed-none   Pt reports she is doing much better than last week. I had to send her out b/c she was HR at 42 and going in and out of bigeminy.  She denies increased sob, no dizziness, no c/p. she does have some edema noted. Her weight is down from last week.  She has bubble packs and really likes those.  V/s obtained as noted.   Will f/u next week.   BP 110/68   Pulse 68   Resp 16   Wt 185 lb (83.9 kg)   SpO2 98%   BMI 29.86 kg/m  Weight yesterday-? 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 (Patient not taking: Reported on 03/10/2024), Disp: 20 tablet, Rfl: 0   dapagliflozin propanediol (FARXIGA) 10 MG TABS tablet, Take 1 tablet (10 mg total) by mouth daily.  Needs appt for future refills, Disp: 30 tablet, Rfl: 6   digoxin (LANOXIN) 0.125 MG tablet, TAKE 1 TABLET BY MOUTH DAILY, Disp: 90 tablet, Rfl: 3   doxylamine, Sleep, (UNISOM) 25 MG tablet, Take 25 mg by mouth at bedtime as needed for sleep. (Patient not taking: Reported on 03/10/2024), Disp: , Rfl:    ELIQUIS 5 MG TABS tablet, TAKE ONE TABLET BY MOUTH TWICE DAILY (AM+BEDTIME), Disp: 60 tablet, Rfl: 6   ferrous sulfate 325 (65 FE) MG tablet, Take 325 mg by mouth daily with breakfast., Disp: , Rfl:    losartan (COZAAR) 25 MG tablet, Take 1 tablet (25 mg total) by mouth 2 (two) times daily., Disp: 60 tablet, Rfl: 3   Multiple Vitamin (MULTIVITAMIN WITH MINERALS) TABS, Take 1 tablet by mouth daily., Disp: , Rfl:    potassium chloride SA (KLOR-CON M) 20 MEQ tablet, Take 4 tablets (80 mEq total) by mouth every morning AND 4 tablets (80 mEq total) every evening., Disp: 240 tablet, Rfl: 11   spironolactone (ALDACTONE) 25 MG tablet, TAKE 1 TABLET BY MOUTH DAILY. HOLD IF SYSTOLIC BLOOD PRESSURE LESS THAN 100, Disp: 45 tablet, Rfl: 3   torsemide (DEMADEX) 20 MG tablet, TAKE 4 TABLETS BY MOUTH IN THE MORNING AND 2 TABLETS EVERY EVENING, Disp: 545 tablet, Rfl: 2 No Known Allergies    Social History   Socioeconomic History   Marital  status: Single    Spouse name: Not on file   Number of children: 3   Years of education: Not on file   Highest education level: Not on file  Occupational History   Occupation: receptionist    Employer: [employer]   Occupation: office cleaning  Tobacco Use   Smoking status: Never   Smokeless tobacco: Never  Vaping Use   Vaping status: Never Used  Substance and Sexual Activity   Alcohol use: Yes    Alcohol/week: 5.0 standard drinks of alcohol    Types: 5 Glasses of wine per week    Comment: daily   Drug use: Never   Sexual activity: Yes    Birth control/protection: Condom  Other Topics Concern   Not on file  Social History Narrative   Not on file   Social  Drivers of Health   Financial Resource Strain: High Risk (12/16/2023)   Overall Financial Resource Strain (CARDIA)    Difficulty of Paying Living Expenses: Very hard  Food Insecurity: No Food Insecurity (07/02/2022)   Hunger Vital Sign    Worried About Running Out of Food in the Last Year: Never true    Ran Out of Food in the Last Year: Never true  Transportation Needs: No Transportation Needs (07/02/2022)   PRAPARE - Administrator, Civil Service (Medical): No    Lack of Transportation (Non-Medical): No  Physical Activity: Not on file  Stress: Not on file  Social Connections: Not on file  Intimate Partner Violence: Not on file    Physical Exam      Future Appointments  Date Time Provider Department Center  03/29/2024  2:00 PM [facility] PA/NP [facility] None       Kerry Hough, Paramedic [phone] [facility] Paramedic  03/17/24

## 2024-03-24 ENCOUNTER — Other Ambulatory Visit (HOSPITAL_COMMUNITY): Payer: Self-pay

## 2024-03-24 NOTE — Progress Notes (Signed)
 Paramedicine Encounter    Patient ID: Sydney Shaffer, female    DOB: 1966-02-08, 58 y.o.   MRN: [id number]   Complaints-eye discomfort   Edema-slight edema to legs   Compliance with meds-yes   Pill box filled-n/a-bubble packs If so, by whom-bubble packs   Refills needed-none      Pt reports she is doing ok. Its been a busy week trying to get all her stuff together.  Pt reports feeling pretty good other than some eye discomfort for past couple days. Vision is not blurred or changed.  No more dizziness.  She does have rt eye discomfort- she will sch eye appoint for that.   Weight fluctuating from 190-192. Her weight is up from last week-185>>190 Appetite good. Sounds like too good. She does feel slightly bloated.  She has been thirsty, but she is trying to cut her fluids back.  No bleeding issues.  On bubble packs now and she is doing well with that.   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 (Patient not taking: Reported on 03/10/2024), Disp: 20 tablet, Rfl: 0   dapagliflozin propanediol (FARXIGA) 10 MG TABS tablet, Take 1 tablet (10 mg total) by mouth daily. Needs appt for future refills, Disp: 30 tablet, Rfl: 6   digoxin (LANOXIN) 0.125 MG tablet, TAKE 1 TABLET BY MOUTH DAILY, Disp: 90 tablet, Rfl: 3   doxylamine, Sleep, (UNISOM) 25 MG tablet, Take 25 mg by mouth at bedtime as needed for sleep. (Patient not taking: Reported on 03/10/2024), Disp: , Rfl:    ELIQUIS 5 MG TABS tablet, TAKE ONE TABLET BY MOUTH TWICE DAILY (AM+BEDTIME), Disp: 60 tablet, Rfl: 6   ferrous sulfate 325 (65 FE) MG tablet, Take 325 mg by mouth daily with breakfast., Disp: , Rfl:    losartan (COZAAR) 25 MG tablet, Take 1 tablet (25 mg total) by mouth 2 (two) times daily., Disp: 60 tablet, Rfl: 3   Multiple Vitamin (MULTIVITAMIN WITH MINERALS) TABS, Take 1 tablet by mouth daily., Disp: , Rfl:    potassium chloride SA (KLOR-CON M) 20 MEQ tablet, Take 4 tablets (80 mEq total) by mouth every morning AND 4 tablets (80 mEq total) every evening., Disp: 240 tablet, Rfl: 11   spironolactone (ALDACTONE) 25 MG tablet, TAKE 1 TABLET BY MOUTH DAILY. HOLD IF SYSTOLIC BLOOD PRESSURE  LESS THAN 100, Disp: 45 tablet, Rfl: 3   torsemide (DEMADEX) 20 MG tablet, TAKE 4 TABLETS BY MOUTH IN THE MORNING AND 2 TABLETS EVERY EVENING, Disp: 545 tablet, Rfl: 2 No Known Allergies    Social History   Socioeconomic History   Marital status: Single    Spouse name: Not on file   Number of children: 3   Years of education: Not on file   Highest education level: Not on file  Occupational History   Occupation: receptionist    Employer: [employer]   Occupation: office cleaning  Tobacco Use   Smoking status: Never   Smokeless tobacco: Never  Vaping Use   Vaping status: Never Used  Substance and Sexual Activity   Alcohol use: Yes     Alcohol/week: 5.0 standard drinks of alcohol    Types: 5 Glasses of wine per week    Comment: daily   Drug use: Never   Sexual activity: Yes    Birth control/protection: Condom  Other Topics Concern   Not on file  Social History Narrative   Not on file   Social Drivers of Health   Financial Resource Strain: High Risk (12/16/2023)   Overall Financial Resource Strain (CARDIA)    Difficulty of Paying Living Expenses: Very hard  Food Insecurity: No Food Insecurity (07/02/2022)   Hunger Vital Sign    Worried About Running Out of Food in the Last Year: Never true    Ran Out of Food in the Last Year: Never true  Transportation Needs: No Transportation Needs (07/02/2022)   PRAPARE - Administrator, Civil Service (Medical): No    Lack of Transportation (Non-Medical): No  Physical Activity: Not on file  Stress: Not on file  Social Connections: Not on file  Intimate Partner Violence: Not on file    Physical Exam      Future Appointments  Date Time Provider Department Center  03/29/2024  2:00 PM [facility] PA/NP [facility] None       Alfonza Angry, Paramedic [phone] [facility] Paramedic  03/24/24

## 2024-03-28 ENCOUNTER — Telehealth (HOSPITAL_COMMUNITY): Payer: Self-pay

## 2024-03-28 NOTE — Telephone Encounter (Signed)
 Called to confirm/remind patient of their appointment at the [facility] on 03/29/24.   Appointment:   [] Confirmed  [x] Left mess   [] No answer/No voice mail  [] VM Full/unable to leave message  And to bring in all medications and/or complete list.

## 2024-03-29 ENCOUNTER — Encounter (HOSPITAL_COMMUNITY): Payer: Self-pay

## 2024-03-29 ENCOUNTER — Ambulatory Visit (HOSPITAL_COMMUNITY)
Admission: RE | Admit: 2024-03-29 | Discharge: 2024-03-29 | Disposition: A | Discharge status: Home / Self Care | Source: Ambulatory Visit | Attending: Family Medicine | Admitting: Family Medicine

## 2024-03-29 VITALS — BP 100/68 | HR 90 | Wt 187.4 lb

## 2024-03-29 DIAGNOSIS — Z139 Encounter for screening, unspecified: Secondary | ICD-10-CM | POA: Diagnosis not present

## 2024-03-29 DIAGNOSIS — I5022 Chronic systolic (congestive) heart failure: Secondary | ICD-10-CM | POA: Diagnosis not present

## 2024-03-29 DIAGNOSIS — Z95810 Presence of automatic (implantable) cardiac defibrillator: Secondary | ICD-10-CM | POA: Insufficient documentation

## 2024-03-29 DIAGNOSIS — I471 Supraventricular tachycardia, unspecified: Secondary | ICD-10-CM | POA: Insufficient documentation

## 2024-03-29 DIAGNOSIS — Z7901 Long term (current) use of anticoagulants: Secondary | ICD-10-CM | POA: Insufficient documentation

## 2024-03-29 DIAGNOSIS — I472 Ventricular tachycardia, unspecified: Secondary | ICD-10-CM | POA: Diagnosis not present

## 2024-03-29 DIAGNOSIS — I428 Other cardiomyopathies: Secondary | ICD-10-CM | POA: Diagnosis not present

## 2024-03-29 DIAGNOSIS — I110 Hypertensive heart disease with heart failure: Secondary | ICD-10-CM | POA: Diagnosis not present

## 2024-03-29 DIAGNOSIS — I081 Rheumatic disorders of both mitral and tricuspid valves: Secondary | ICD-10-CM | POA: Insufficient documentation

## 2024-03-29 LAB — BASIC METABOLIC PANEL WITH GFR
Anion gap: 11 (ref 5–15)
BUN: 14 mg/dL (ref 6–20)
CO2: 26 mmol/L (ref 22–32)
Calcium: 9.9 mg/dL (ref 8.9–10.3)
Chloride: 101 mmol/L (ref 98–111)
Creatinine, Ser: 1.15 mg/dL — ABNORMAL HIGH (ref 0.44–1.00)
GFR, Estimated: 55 mL/min — ABNORMAL LOW (ref >60)
Glucose, Bld: 107 mg/dL — ABNORMAL HIGH (ref 70–99)
Potassium: 3.5 mmol/L (ref 3.5–5.1)
Sodium: 138 mmol/L (ref 135–145)

## 2024-03-29 NOTE — Progress Notes (Signed)
 [facility] Note   PCP: Vernel Golds, PA-C ([facility]) EP: Dr Rodolfo Clan Medtronic ICD HF Cardiologist: Dr. Julane Ny  HPI: Sydney Shaffer is a 58 y.o. emale with chronic systolic HF due to NICM s/p Medtronic ICD, HTN, and SVT s/p ablation 2/1  Cath 4/13 and 10/17 and 7/24 normal coronaries.  Echo 6/19 EF 30-35% mild MR. S/p ICD gen change 12/20.  Reestablished with HF clinic in April 2023.    Echo 4/23: EF 25-30%, LV severely dilated, grade III DD, RV okay, RVSP 49 mmHg, moderate MR, mild to moderate TR   Admitted 7/23 with ADHF after being out of meds. Referred for paramedicine.  Amiodarone  increased d/t episodes of VT (monitor only) and runs SVT and AF.   CPX 4/24  FVC 1.67 (52%)      FEV1 1.37 (54%)        FEV1/FVC 82 (103%)        MVV 50 (49%)      BP rest: 88/68 ->  BP peak: 122/68  pVO2: 12.4 (55% predicted peak VO2) VE/VCO2 slope:  31  Peak RER: 1.12  VE/MVV:  79% (VE/FEV1*40 = 72%)   Echo  03/02/23 EF 20-25% RV mild HK   R/L cath 07/01/23 Normal coronaries Ao = 89/68 (80) LV = 105/28 RA = 4 RV = 47/6 PA = 46/20 (31) PCW = 19 (v = 25-30) Fick cardiac output/index =4.5/2.3 Thermo CP/CI = 3.9/2.0 PVR = 2.6 WU Ao sat = 91% PA sat = 62%, 64%  Echo 01/15/24 EF 25% + LVNC RV ok   Seen in ED 03/10/24 with palpitations, intermittent bigeminy. ECG with NSR, K 3.4 an dshe was supplemented. HsTroponins 6>>8. Device interrogation showed no arrhythmias.   Today she returns for HF follow up. Overall feeling fine. No SOB with activity, she can clean her house with rest breaks. Walks around her apartment complex without dyspnea. Denies palpitations, abnormal bleeding, CP, dizziness, edema, or PND/Orthopnea. Appetite ok. No fever or chills. Weight at home 187-190 pounds. Taking all medications.   Cardiac Studies: - Echo 2/25: EF 25% + LVNC RV ok   - Echo (4/23): EF 25-30% - Echo (10/17): EF 25-30%, Grade 1 DD - Echo (6/16): EF 30-35% mild MR - Echo (2/15):  EF 20-25%  - Echo (7/13): EF 20-25%   R/LHC (10/17): normal cors  CPX (5/18)  Peak VO2: 12.3 (61% predicted peak VO2) VE/VCO2 slope:  25 Peak RER: 0.98  CPX (6/15):  Peak VO2: 12.6 (57.8% predicted peak VO2) VE/VCO2 slope: 28.6 Peak RER: 1.01  SH: She is not a smoker. She does not drink alcohol. Lives alone. She has 3 grown children   FH: Mom breast cancer. Father cancer.   ROS: All systems negative except as listed in HPI, PMH and Problem List.  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    Chronic systolic heart failure (HCC)    Dysrhythmia    Bradycardia   Hypertension    Hypertensive heart disease with CHF (HCC)    ICD (implantable cardiac defibrillator) in place 08/13/2012  Menorrhagia    Metrorrhagia 06/04/2015   SVT (supraventricular tachycardia) (HCC) 03/30/2014   VT (ventricular tachycardia) (HCC) 07/22/2014   Current Outpatient Medications  Medication Sig Dispense Refill   acetaminophen  (TYLENOL ) 500 MG tablet Take 1,000 mg by mouth every 6 (six) hours as needed for moderate pain.     amiodarone  (PACERONE ) 200 MG tablet TAKE 1/2 TABLET ( 100 MG ) BY MOUTH DAILIY 45 tablet 3   carvedilol  (COREG ) 3.125 MG tablet TAKE ONE TABLET BY MOUTH TWICE DAILY (AM+BEDTIME) 60 tablet 3   cyclobenzaprine  (FLEXERIL ) 10 MG tablet Take 1 tablet (10 mg total) by mouth 3 (three) times daily as needed for muscle spasms. 20 tablet 0   dapagliflozin  propanediol (FARXIGA ) 10 MG TABS tablet Take 1 tablet (10 mg total) by mouth daily. Needs appt for future refills 30 tablet 6   digoxin  (LANOXIN ) 0.125 MG tablet TAKE 1 TABLET BY  MOUTH DAILY 90 tablet 3   doxylamine , Sleep, (UNISOM ) 25 MG tablet Take 25 mg by mouth at bedtime as needed for sleep.     ELIQUIS  5 MG TABS tablet TAKE ONE TABLET BY MOUTH TWICE DAILY (AM+BEDTIME) 60 tablet 6   ferrous sulfate  325 (65 FE) MG tablet Take 325 mg by mouth daily with breakfast.     losartan  (COZAAR ) 25 MG tablet Take 1 tablet (25 mg total) by mouth 2 (two) times daily. 60 tablet 3   Multiple Vitamin (MULTIVITAMIN WITH MINERALS) TABS Take 1 tablet by mouth daily.     potassium chloride  SA (KLOR-CON  M) 20 MEQ tablet Take 4 tablets (80 mEq total) by mouth every morning AND 4 tablets (80 mEq total) every evening. 240 tablet 11   spironolactone  (ALDACTONE ) 25 MG tablet TAKE 1 TABLET BY MOUTH DAILY. HOLD IF SYSTOLIC BLOOD PRESSURE LESS THAN 100 45 tablet 3   torsemide  (DEMADEX ) 20 MG tablet TAKE 4 TABLETS BY MOUTH IN THE MORNING AND 2 TABLETS EVERY EVENING 545 tablet 2   No current facility-administered medications for this encounter.   Wt Readings from Last 3 Encounters:  03/29/24 85 kg (187 lb 6.4 oz)  03/24/24 86.2 kg (190 lb)  03/17/24 83.9 kg (185 lb)   BP 100/68   Pulse 90   Wt 85 kg (187 lb 6.4 oz)   SpO2 97%   BMI 30.25 kg/m   PHYSICAL EXAM: General:  NAD. No resp difficulty, walked into clinic HEENT: Normal Neck: Supple. No JVD. Cor: Regular rate & rhythm. No rubs, gallops or murmurs. Lungs: Clear Abdomen: Soft, nontender, nondistended.  Extremities: No cyanosis, clubbing, rash, edema Neuro: Alert & oriented x 3, moves all 4 extremities w/o difficulty. Affect pleasant.  Device interrogation (personally reviewed): OptiVol stable, <0.1 hr/day AF, 1.3 hr/day activity, no VT   ASSESSMENT & PLAN: 1. Chronic Systolic Heart Failure:  - Normal cors by cath 2013. Nonischemic cardiomyopathy s/p Medtronic ICD.  - Echo (6/16): EF 30-35%.   - Echo (6/19): EF 30-35%. - Echo (4/23): EF 25-30% - Echo 03/02/23 EF 20-25% RV mild HK Personally reviewed -> echo concerning for  LVNC will need cMRI - ICD generator change 11/14/19 - CPX 4/24 FVC 1.67 (52%) FEV1 1.37 (54%) FEV1/FVC 82 (103%) BP rest: 88/68 ->  BP peak: 122/68 pVO2: 12.4 (55% predicted peak VO2) VE/VCO2 31 pRER: 1.12 VE/MVV:  79% - R/L cath 07/01/23 normal cors RA 4 PA 46/20 (31) PCW 19 (v = 25-30) Fick 4.5/2.3 Thermo 3.9/2.0 PVR 2.6 WU - Echo 2/25 EF 25% + LVNC RV ok  -  Improved NYHA II. She has been very compliant with meds. Volume ok today. - Continue torsemide  80/40 + 80 KCL bid. - Continue losartan  25 mg bid. BP soft today, I asked her to notify clinic if she becomes symptomatic - Continue spiro 25 mg daily. - Continue Coreg  3.125 mg bid (dose decreased due to intolerance) - Continue Farxiga  10 mg daily. - Continue digoxin  0.125 daily. Dig level 0.4 03/10/24. - Continue Eliquis  in setting of LVNC and reduced EF  - Blood Type O - She is interested in advanced therapies (particularly transplant), but still a bit early.  - Repeat CPX if symptoms worsen. - Labs today.                                                                                            -  2. SVT and VT  - s/p atrial tachycardia ablation in 01/2014.   - No VT on ICD interorgation tday - Continue amio 100 daily - Follows with Dr. Rodolfo Clan - Amio labs UTD.  3. SDOH - Has done very well with Paramedicine support.  - Compliance much improved.   Doing well! Follow up in 4 months with Dr. Bensimhon.  Sydney Shaffer [facility] [place], FNP-BC 03/29/2024

## 2024-03-29 NOTE — Patient Instructions (Signed)
 No change in medications. Labs today - will call you if abnormal. Return to see Dr. Julane Ny in 4 months. CALL US  AT [phone] IN JULY TO SCHEDULE THIS APPOINTMENT. Please call us  at [phone] if any questions or concerns prior to your next visit.

## 2024-03-31 ENCOUNTER — Other Ambulatory Visit (HOSPITAL_COMMUNITY): Payer: Self-pay

## 2024-03-31 NOTE — Progress Notes (Signed)
 Paramedicine Encounter    Patient ID: Sydney Shaffer, female    DOB: 1966-03-27, 58 y.o.   MRN: [id number]   Complaints-none   Edema-none   Compliance with meds-yes   Pill box filled-bubble packs If so, by whom-bubble packs   Refills needed-none -she is on week 3 of her meds  She knows to call once she gets started on wk 4.    She went to clinic yesterday, got a good report. Labs were stable. No med changes.  Her b/p was on the lower side of normal at clinic- will continue to monitor.  Appetite is ok. Drinking fluids ok.  No bleeding issues.    BP 118/70   Pulse 72   Resp 16   SpO2 98%  Weight yesterday-187 Last visit weight-190  Patient Care Team: Patient, No Pcp Per as PCP - General (General Practice)  Patient Active Problem List   Diagnosis Date Noted  . Dilated cardiomyopathy (HCC) 06/04/2015  . Hypokalemia   . HTN (hypertension) 03/30/2014  . Chest pressure- unspecified 02/03/2014  . Atrial tachycardia (HCC) 10/01/2012  . 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  . Hypertensive heart disease with CHF (congestive heart failure) (HCC) 08/04/2012  . 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 Anemia 03/29/2012    Current Outpatient Medications:  .  acetaminophen  (TYLENOL ) 500 MG tablet, Take 1,000 mg by mouth every 6 (six) hours as needed for moderate pain., Disp: , Rfl:  .  amiodarone  (PACERONE ) 200 MG tablet, TAKE 1/2 TABLET ( 100 MG ) BY MOUTH DAILIY, Disp: 45 tablet, Rfl: 3 .  carvedilol  (COREG ) 3.125 MG tablet, TAKE ONE TABLET BY MOUTH TWICE DAILY (AM+BEDTIME), Disp: 60 tablet, Rfl: 3 .  cyclobenzaprine  (FLEXERIL ) 10 MG tablet, Take 1 tablet (10 mg total) by mouth 3 (three) times daily as needed for muscle spasms., Disp: 20 tablet, Rfl: 0 .  dapagliflozin  propanediol (FARXIGA ) 10 MG TABS tablet, Take 1 tablet (10 mg total) by mouth daily. Needs appt for future refills, Disp: 30 tablet, Rfl: 6 .  digoxin  (LANOXIN ) 0.125 MG tablet, TAKE 1  TABLET BY MOUTH DAILY, Disp: 90 tablet, Rfl: 3 .  doxylamine , Sleep, (UNISOM ) 25 MG tablet, Take 25 mg by mouth at bedtime as needed for sleep., Disp: , Rfl:  .  ELIQUIS  5 MG TABS tablet, TAKE ONE TABLET BY MOUTH TWICE DAILY (AM+BEDTIME), Disp: 60 tablet, Rfl: 6 .  ferrous sulfate  325 (65 FE) MG tablet, Take 325 mg by mouth daily with breakfast., Disp: , Rfl:  .  losartan  (COZAAR ) 25 MG tablet, Take 1 tablet (25 mg total) by mouth 2 (two) times daily., Disp: 60 tablet, Rfl: 3 .  Multiple Vitamin (MULTIVITAMIN WITH MINERALS) TABS, Take 1 tablet by mouth daily., Disp: , Rfl:  .  potassium chloride  SA (KLOR-CON  M) 20 MEQ tablet, Take 4 tablets (80 mEq total) by mouth every morning AND 4 tablets (80 mEq total) every evening., Disp: 240 tablet, Rfl: 11 .  spironolactone  (ALDACTONE ) 25 MG tablet, TAKE 1 TABLET BY MOUTH DAILY. HOLD IF SYSTOLIC BLOOD PRESSURE LESS THAN 100, Disp: 45 tablet, Rfl: 3 .  torsemide  (DEMADEX ) 20 MG tablet, TAKE 4 TABLETS BY MOUTH IN THE MORNING AND 2 TABLETS EVERY EVENING, Disp: 545 tablet, Rfl: 2 No Known Allergies    Social History   Socioeconomic History  . Marital status: Single    Spouse name: Not on file  . Number of children: 3  . Years of education: Not on file  . Highest education level: Not on  file  Occupational History  . Occupation: Database administrator: [employer]  . Occupation: office cleaning  Tobacco Use  . Smoking status: Never  . Smokeless tobacco: Never  Vaping Use  . Vaping status: Never Used  Substance and Sexual Activity  . Alcohol use: Yes    Alcohol/week: 5.0 standard drinks of alcohol    Types: 5 Glasses of wine per week    Comment: daily  . Drug use: Never  . Sexual activity: Yes    Birth control/protection: Condom  Other Topics Concern  . Not on file  Social History Narrative  . Not on file   Social Drivers of Health   Financial Resource Strain: High Risk (12/16/2023)   Overall Financial Resource Strain (CARDIA)   .  Difficulty of Paying Living Expenses: Very hard  Food Insecurity: No Food Insecurity (07/02/2022)   Hunger Vital Sign   . Worried About Programme researcher, broadcasting/film/video in the Last Year: Never true   . Ran Out of Food in the Last Year: Never true  Transportation Needs: No Transportation Needs (07/02/2022)   PRAPARE - Transportation   . Lack of Transportation (Medical): No   . Lack of Transportation (Non-Medical): No  Physical Activity: Not on file  Stress: Not on file  Social Connections: Not on file  Intimate Partner Violence: Not on file    Physical Exam      No future appointments.     Alfonza Angry, Paramedic [phone] [facility] Paramedic  03/31/24

## 2024-04-12 ENCOUNTER — Encounter: Payer: Self-pay | Admitting: Cardiology

## 2024-04-21 ENCOUNTER — Other Ambulatory Visit (HOSPITAL_COMMUNITY): Payer: Self-pay

## 2024-04-21 NOTE — Progress Notes (Signed)
 Paramedicine Encounter    Patient ID: Sydney Shaffer, female    DOB: August 13, 1966, 58 y.o.   MRN: [id number]   Complaints-none  Edema-to legs   Compliance with meds-yes  Pill box filled-bubble packs  If so, by whom-n/a  Refills needed-needs bubble packs      Pt reports she is feeling good. She denies increased sob, no dizziness , no c/p.   Pt got back from vacation last week and reports that went well. No issues or concerns with that.  She reports feeling tired due to her sleep pattern is off, she is having trouble falling asleep. She is going to try melatonin to see if that helps.  Her weight is up but she did eat a lot while on vacation, she does not feel bloated. But she does have some edema to her legs though.  No bleeding issues, no issues with urination.   Reached out to pharm to get her bubble packs filled for next week.   BP 126/82   Pulse 82   Resp 16   Wt 197 lb (89.4 kg)   SpO2 98%   BMI 31.80 kg/m  Weight yesterday- Last visit weight-187  Patient Care Team: Patient, No Pcp Per as PCP - General (General Practice)  Patient Active Problem List   Diagnosis Date Noted  . Dilated cardiomyopathy (HCC) 06/04/2015  . Hypokalemia   . HTN (hypertension) 03/30/2014  . Chest pressure- unspecified 02/03/2014  . Atrial tachycardia (HCC) 10/01/2012  . 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  . Hypertensive heart disease with CHF (congestive heart failure) (HCC) 08/04/2012  . 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 Anemia 03/29/2012    Current Outpatient Medications:  .  acetaminophen  (TYLENOL ) 500 MG tablet, Take 1,000 mg by mouth every 6 (six) hours as needed for moderate pain., Disp: , Rfl:  .  amiodarone  (PACERONE ) 200 MG tablet, TAKE 1/2 TABLET ( 100 MG ) BY MOUTH DAILIY, Disp: 45 tablet, Rfl: 3 .  carvedilol  (COREG ) 3.125 MG tablet, TAKE ONE TABLET BY MOUTH TWICE DAILY (AM+BEDTIME), Disp: 60 tablet, Rfl: 3 .  dapagliflozin  propanediol  (FARXIGA ) 10 MG TABS tablet, Take 1 tablet (10 mg total) by mouth daily. Needs appt for future refills, Disp: 30 tablet, Rfl: 6 .  digoxin  (LANOXIN ) 0.125 MG tablet, TAKE 1 TABLET BY MOUTH DAILY, Disp: 90 tablet, Rfl: 3 .  ELIQUIS  5 MG TABS tablet, TAKE ONE TABLET BY MOUTH TWICE DAILY (AM+BEDTIME), Disp: 60 tablet, Rfl: 6 .  ferrous sulfate  325 (65 FE) MG tablet, Take 325 mg by mouth daily with breakfast., Disp: , Rfl:  .  losartan  (COZAAR ) 25 MG tablet, Take 1 tablet (25 mg total) by mouth 2 (two) times daily., Disp: 60 tablet, Rfl: 3 .  Multiple Vitamin (MULTIVITAMIN WITH MINERALS) TABS, Take 1 tablet by mouth daily., Disp: , Rfl:  .  potassium chloride  SA (KLOR-CON  M) 20 MEQ tablet, Take 4 tablets (80 mEq total) by mouth every morning AND 4 tablets (80 mEq total) every evening., Disp: 240 tablet, Rfl: 11 .  spironolactone  (ALDACTONE ) 25 MG tablet, TAKE 1 TABLET BY MOUTH DAILY. HOLD IF SYSTOLIC BLOOD PRESSURE LESS THAN 100, Disp: 45 tablet, Rfl: 3 .  torsemide  (DEMADEX ) 20 MG tablet, TAKE 4 TABLETS BY MOUTH IN THE MORNING AND 2 TABLETS EVERY EVENING, Disp: 545 tablet, Rfl: 2 .  cyclobenzaprine  (FLEXERIL ) 10 MG tablet, Take 1 tablet (10 mg total) by mouth 3 (three) times daily as needed for muscle spasms., Disp: 20 tablet, Rfl: 0 .  doxylamine , Sleep, (UNISOM ) 25 MG tablet, Take 25 mg by mouth at bedtime as needed for sleep., Disp: , Rfl:  No Known Allergies    Social History   Socioeconomic History  . Marital status: Single    Spouse name: Not on file  . Number of children: 3  . Years of education: Not on file  . Highest education level: Not on file  Occupational History  . Occupation: Database administrator: [employer]  . Occupation: office cleaning  Tobacco Use  . Smoking status: Never  . Smokeless tobacco: Never  Vaping Use  . Vaping status: Never Used  Substance and Sexual Activity  . Alcohol use: Yes    Alcohol/week: 5.0 standard drinks of alcohol    Types: 5 Glasses of  wine per week    Comment: daily  . Drug use: Never  . Sexual activity: Yes    Birth control/protection: Condom  Other Topics Concern  . Not on file  Social History Narrative  . Not on file   Social Drivers of Health   Financial Resource Strain: High Risk (12/16/2023)   Overall Financial Resource Strain (CARDIA)   . Difficulty of Paying Living Expenses: Very hard  Food Insecurity: No Food Insecurity (07/02/2022)   Hunger Vital Sign   . Worried About Programme researcher, broadcasting/film/video in the Last Year: Never true   . Ran Out of Food in the Last Year: Never true  Transportation Needs: No Transportation Needs (07/02/2022)   PRAPARE - Transportation   . Lack of Transportation (Medical): No   . Lack of Transportation (Non-Medical): No  Physical Activity: Not on file  Stress: Not on file  Social Connections: Not on file  Intimate Partner Violence: Not on file    Physical Exam      No future appointments.     Alfonza Angry, Paramedic [phone] [facility] Paramedic  04/21/24

## 2024-04-28 ENCOUNTER — Other Ambulatory Visit (HOSPITAL_COMMUNITY): Payer: Self-pay

## 2024-04-28 NOTE — Progress Notes (Signed)
 Paramedicine Encounter    Patient ID: Sydney Shaffer, female    DOB: [birth year], 58 y.o.   MRN: [id number]   Complaints-tired  Edema-none   Compliance with meds-yes  Pill box filled-n/a If so, by whom-bubble packs   Refills needed-bubble packs are ready for p/u   Pt reports she just feels tired today. No increased sob, no dizziness, no c/p.  She had lower abd pain with slight vaginal bleeding yesterday-she has not had period in quite a few years-she does have f/u with gyno next month.  V/s stable. Weight has come down from last visit. No edema noted.  She is sleeping a little better with the melatonin.    Will f/u next week.    BP 108/80   Pulse 80   Resp 16   Wt 190 lb (86.2 kg)   SpO2 98%   BMI 30.67 kg/m  Weight yesterday-? 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   Anemia 03/29/2012    Current Outpatient Medications:    acetaminophen  (TYLENOL ) 500 MG tablet, Take 1,000 mg by mouth every 6 (six) hours as needed for moderate pain., Disp: , Rfl:    amiodarone  (PACERONE ) 200 MG tablet, TAKE 1/2 TABLET ( 100 MG ) BY MOUTH DAILIY, Disp: 45 tablet, Rfl: 3   carvedilol  (COREG ) 3.125 MG tablet, TAKE ONE TABLET BY MOUTH TWICE DAILY (AM+BEDTIME), Disp: 60 tablet, Rfl: 3   cyclobenzaprine  (FLEXERIL ) 10 MG tablet, Take 1 tablet (10 mg total) by mouth 3 (three) times daily as needed for muscle spasms., Disp: 20 tablet, Rfl: 0   dapagliflozin  propanediol (FARXIGA ) 10 MG TABS tablet, Take 1 tablet (10 mg total) by mouth  daily. Needs appt for future refills, Disp: 30 tablet, Rfl: 6   digoxin  (LANOXIN ) 0.125 MG tablet, TAKE 1 TABLET BY MOUTH DAILY, Disp: 90 tablet, Rfl: 3   doxylamine , Sleep, (UNISOM ) 25 MG tablet, Take 25 mg by mouth at bedtime as needed for sleep., Disp: , Rfl:    ELIQUIS  5 MG TABS tablet, TAKE ONE TABLET BY MOUTH TWICE DAILY (AM+BEDTIME), Disp: 60 tablet, Rfl: 6   ferrous sulfate  325 (65 FE) MG tablet, Take 325 mg by mouth daily with breakfast., Disp: , Rfl:    losartan  (COZAAR ) 25 MG tablet, Take 1 tablet (25 mg total) by mouth 2 (two) times daily., Disp: 60 tablet, Rfl: 3   Multiple Vitamin (MULTIVITAMIN WITH MINERALS) TABS, Take 1 tablet by mouth daily., Disp: , Rfl:    potassium chloride  SA (KLOR-CON  M) 20 MEQ tablet, Take 4 tablets (80 mEq total) by mouth every morning AND 4 tablets (80 mEq total) every evening., Disp: 240 tablet, Rfl: 11   spironolactone  (ALDACTONE ) 25 MG tablet, TAKE 1 TABLET BY MOUTH DAILY. HOLD IF SYSTOLIC BLOOD PRESSURE LESS THAN 100, Disp: 45 tablet, Rfl: 3   torsemide  (DEMADEX ) 20 MG tablet, TAKE 4 TABLETS BY MOUTH IN THE MORNING AND 2 TABLETS EVERY EVENING, Disp: 545 tablet, Rfl: 2 No Known Allergies    Social History   Socioeconomic History   Marital status: Single    Spouse name: Not on  file   Number of children: 3   Years of education: Not on file   Highest education level: Not on file  Occupational History   Occupation: receptionist    Employer: [employer]   Occupation: office cleaning  Tobacco Use   Smoking status: Never   Smokeless tobacco: Never  Vaping Use   Vaping status: Never Used  Substance and Sexual Activity   Alcohol use: Yes    Alcohol/week: 5.0 standard drinks of alcohol    Types: 5 Glasses of wine per week    Comment: daily   Drug use: Never   Sexual activity: Yes    Birth control/protection: Condom  Other Topics Concern   Not on file  Social History Narrative   Not on file   Social Drivers of Health   Financial Resource  Strain: High Risk (12/16/2023)   Overall Financial Resource Strain (CARDIA)    Difficulty of Paying Living Expenses: Very hard  Food Insecurity: No Food Insecurity (07/02/2022)   Hunger Vital Sign    Worried About Running Out of Food in the Last Year: Never true    Ran Out of Food in the Last Year: Never true  Transportation Needs: No Transportation Needs (07/02/2022)   PRAPARE - Administrator, Civil Service (Medical): No    Lack of Transportation (Non-Medical): No  Physical Activity: Not on file  Stress: Not on file  Social Connections: Not on file  Intimate Partner Violence: Not on file    Physical Exam      No future appointments.     Alfonza Angry, Paramedic [phone] [facility] Paramedic  04/28/24

## 2024-05-11 ENCOUNTER — Other Ambulatory Visit (HOSPITAL_COMMUNITY): Payer: Self-pay

## 2024-05-11 NOTE — Progress Notes (Signed)
 Paramedicine Encounter    Patient ID: Sydney Shaffer, female    DOB: 06-11-1966, 58 y.o.   MRN: [id number]   Complaints-denies  Edema-none   Compliance with meds-yes   Pill box filled-n/a bubble packs  If so, by whom-pharmacy   Refills needed-none   Pt reports she is doing well.  Denies dizziness, no bloating, no edema noted, no c/p, no bleeding issues, no abd pains, no sob, no palpitations.   She is due in aug for f/u.  She does get tired and slight sob when walking up lots of stairs or a lot of exertion but after rest she is fine and that doesn't happen often.  Sleeping better.  Will f/u in a few wks.    BP 120/70   Pulse 82   Resp 16   Wt 194 lb (88 kg)   SpO2 98%   BMI 31.31 kg/m  Weight yesterday-? 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 (Patient not taking: Reported on 04/28/2024), Disp: 20 tablet, Rfl: 0   dapagliflozin  propanediol (FARXIGA ) 10 MG TABS tablet, Take 1 tablet (10  mg total) by mouth daily. Needs appt for future refills, Disp: 30 tablet, Rfl: 6   digoxin  (LANOXIN ) 0.125 MG tablet, TAKE 1 TABLET BY MOUTH DAILY, Disp: 90 tablet, Rfl: 3   doxylamine , Sleep, (UNISOM ) 25 MG tablet, Take 25 mg by mouth at bedtime as needed for sleep. (Patient not taking: Reported on 04/28/2024), Disp: , Rfl:    ELIQUIS  5 MG TABS tablet, TAKE ONE TABLET BY MOUTH TWICE DAILY (AM+BEDTIME), Disp: 60 tablet, Rfl: 6   ferrous sulfate  325 (65 FE) MG tablet, Take 325 mg by mouth daily with breakfast., Disp: , Rfl:    losartan  (COZAAR ) 25 MG tablet, Take 1 tablet (25 mg total) by mouth 2 (two) times daily., Disp: 60 tablet, Rfl: 3   Multiple Vitamin (MULTIVITAMIN WITH MINERALS) TABS, Take 1 tablet by mouth daily., Disp: , Rfl:    potassium chloride  SA (KLOR-CON  M) 20 MEQ tablet, Take 4 tablets (80 mEq total) by mouth every morning AND 4 tablets (80 mEq total) every evening., Disp: 240 tablet, Rfl: 11   spironolactone  (ALDACTONE ) 25 MG tablet, TAKE 1 TABLET BY MOUTH DAILY. HOLD IF SYSTOLIC BLOOD PRESSURE LESS THAN 100, Disp: 45 tablet, Rfl: 3   torsemide  (DEMADEX ) 20 MG tablet, TAKE 4 TABLETS BY MOUTH IN THE MORNING AND 2 TABLETS EVERY EVENING, Disp: 545 tablet, Rfl: 2 No Known Allergies    Social History  Socioeconomic History   Marital status: Single    Spouse name: Not on file   Number of children: 3   Years of education: Not on file   Highest education level: Not on file  Occupational History   Occupation: receptionist    Employer: [employer]   Occupation: office cleaning  Tobacco Use   Smoking status: Never   Smokeless tobacco: Never  Vaping Use   Vaping status: Never Used  Substance and Sexual Activity   Alcohol use: Yes    Alcohol/week: 5.0 standard drinks of alcohol    Types: 5 Glasses of wine per week    Comment: daily   Drug use: Never   Sexual activity: Yes    Birth control/protection: Condom  Other Topics Concern   Not on file  Social History Narrative    Not on file   Social Drivers of Health   Financial Resource Strain: High Risk (12/16/2023)   Overall Financial Resource Strain (CARDIA)    Difficulty of Paying Living Expenses: Very hard  Food Insecurity: No Food Insecurity (07/02/2022)   Hunger Vital Sign    Worried About Running Out of Food in the Last Year: Never true    Ran Out of Food in the Last Year: Never true  Transportation Needs: No Transportation Needs (07/02/2022)   PRAPARE - Administrator, Civil Service (Medical): No    Lack of Transportation (Non-Medical): No  Physical Activity: Not on file  Stress: Not on file  Social Connections: Not on file  Intimate Partner Violence: Not on file    Physical Exam      No future appointments.     Alfonza Angry, Paramedic [phone] [facility] Paramedic  05/11/24

## 2024-05-26 ENCOUNTER — Other Ambulatory Visit (HOSPITAL_COMMUNITY): Payer: Self-pay

## 2024-05-26 NOTE — Progress Notes (Signed)
 Paramedicine Encounter    Patient ID: Sydney Shaffer, female    DOB: August 26, 1966, 58 y.o.   MRN: [id number]   Complaints-denies   Edema-slight to her legs  Compliance with meds-yes   Pill box filled-n/a on bubble packs  If so, by whom-bubble packs   Refills needed-none   Pt reports she is doing ok, she has had some extra outside stress-she has gotten a letter reporting an appeal was not closed out when she went to court about her rent a few months ago, and since then she has been to date on payments and the landlord said he was going to handle it but she has not heard anything yet.  She had a lawyer from [facility] helping her during this process so I suggested she reach out to her again and she if she can expedite this so she doesn't have to go back to court.  On top of that, her tuck broke down so having to share a car with her daughter which is stressful.   She reports feeling ok most of the time. She does get sob if she exerts herself too much.  Gets tired off and on.  She reports she had c/p on Monday while she was vacuuming and after a few min of rest it subsided.  None since then.     She is on bubble packs and reports compliance with those.  Will f/u in 2 wks.   BP 106/68   Pulse 68   Resp 16   Wt 189 lb (85.7 kg)   SpO2 98%   BMI 30.51 kg/m  Weight yesterday--?  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   Anemia 03/29/2012    Current Outpatient Medications:    acetaminophen  (TYLENOL ) 500 MG tablet, Take 1,000  mg by mouth every 6 (six) hours as needed for moderate pain., Disp: , Rfl:    amiodarone  (PACERONE ) 200 MG tablet, TAKE 1/2 TABLET ( 100 MG ) BY MOUTH DAILIY, Disp: 45 tablet, Rfl: 3   carvedilol  (COREG ) 3.125 MG tablet, TAKE ONE TABLET BY MOUTH TWICE DAILY (AM+BEDTIME), Disp: 60 tablet, Rfl: 3   cyclobenzaprine  (FLEXERIL ) 10 MG tablet, Take 1 tablet (10 mg total) by mouth 3 (three) times daily as needed for muscle spasms., Disp: 20 tablet, Rfl: 0   dapagliflozin  propanediol (FARXIGA ) 10 MG TABS tablet, Take 1 tablet (10 mg total) by mouth daily. Needs appt for future refills, Disp: 30 tablet, Rfl: 6   digoxin  (LANOXIN ) 0.125 MG tablet, TAKE 1 TABLET BY MOUTH DAILY, Disp: 90 tablet, Rfl: 3   doxylamine , Sleep, (UNISOM ) 25 MG tablet, Take 25 mg by mouth at bedtime as needed for sleep., Disp: , Rfl:    ELIQUIS  5 MG TABS tablet, TAKE ONE TABLET BY MOUTH TWICE DAILY (AM+BEDTIME), Disp: 60 tablet, Rfl: 6   ferrous sulfate  325 (65 FE) MG tablet, Take 325 mg by mouth daily with breakfast., Disp: , Rfl:    losartan  (COZAAR ) 25 MG tablet, Take 1 tablet (25 mg total) by mouth 2 (two) times daily., Disp: 60 tablet, Rfl: 3  Multiple Vitamin (MULTIVITAMIN WITH MINERALS) TABS, Take 1 tablet by mouth daily., Disp: , Rfl:    potassium chloride  SA (KLOR-CON  M) 20 MEQ tablet, Take 4 tablets (80 mEq total) by mouth every morning AND 4 tablets (80 mEq total) every evening., Disp: 240 tablet, Rfl: 11   spironolactone  (ALDACTONE ) 25 MG tablet, TAKE 1 TABLET BY MOUTH DAILY. HOLD IF SYSTOLIC BLOOD PRESSURE LESS THAN 100, Disp: 45 tablet, Rfl: 3   torsemide  (DEMADEX ) 20 MG tablet, TAKE 4 TABLETS BY MOUTH IN THE MORNING AND 2 TABLETS EVERY EVENING, Disp: 545 tablet, Rfl: 2 No Known Allergies    Social History   Socioeconomic History   Marital status: Single    Spouse name: Not on file   Number of children: 3   Years of education: Not on file   Highest education level: Not on file  Occupational History    Occupation: receptionist    Employer: [employer]   Occupation: office cleaning  Tobacco Use   Smoking status: Never   Smokeless tobacco: Never  Vaping Use   Vaping status: Never Used  Substance and Sexual Activity   Alcohol use: Yes    Alcohol/week: 5.0 standard drinks of alcohol    Types: 5 Glasses of wine per week    Comment: daily   Drug use: Never   Sexual activity: Yes    Birth control/protection: Condom  Other Topics Concern   Not on file  Social History Narrative   Not on file   Social Drivers of Health   Financial Resource Strain: High Risk (12/16/2023)   Overall Financial Resource Strain (CARDIA)    Difficulty of Paying Living Expenses: Very hard  Food Insecurity: No Food Insecurity (07/02/2022)   Hunger Vital Sign    Worried About Running Out of Food in the Last Year: Never true    Ran Out of Food in the Last Year: Never true  Transportation Needs: No Transportation Needs (07/02/2022)   PRAPARE - Administrator, Civil Service (Medical): No    Lack of Transportation (Non-Medical): No  Physical Activity: Not on file  Stress: Not on file  Social Connections: Not on file  Intimate Partner Violence: Not on file    Physical Exam      No future appointments.     Alfonza Angry, Paramedic [phone] [facility] Paramedic  05/26/24

## 2024-06-09 ENCOUNTER — Encounter

## 2024-06-14 ENCOUNTER — Encounter

## 2024-06-16 ENCOUNTER — Other Ambulatory Visit (HOSPITAL_COMMUNITY): Payer: Self-pay

## 2024-06-16 NOTE — Progress Notes (Signed)
 Paramedicine Encounter    Patient ID: Sydney Shaffer, female    DOB: December 24, 1965, 58 y.o.   MRN: [id number]   Complaints-none   Edema-slight   Compliance with meds-yes  Pill box filled-on bubble packs If so, by whom-pharmacy   Refills needed-she called this week to get refilled      Pt reports she is doing well. She celebrated her birthday since I seen her last so she did go out for that and splurged on dining and dessert.  She has very slight edema to legs.  This heat does make is more difficult to work etc, so she does get sob upon exertion in warmer temps.  She denies dizziness, no c/p.  Has bubble packs.  Seems to be doing well most of the time but does have day every now and then where she feels tired and drained.   Will f/u in 2 wks.    BP 104/64   Pulse 90   Resp 16   Wt 189 lb (85.7 kg)   SpO2 96%   BMI 30.51 kg/m  Weight yesterday-? 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   Anemia 03/29/2012    Current Outpatient Medications:    acetaminophen  (TYLENOL ) 500 MG tablet, Take 1,000 mg by mouth every 6 (six) hours as needed for moderate pain., Disp: , Rfl:    amiodarone  (PACERONE ) 200 MG tablet, TAKE 1/2 TABLET ( 100 MG ) BY MOUTH DAILIY, Disp: 45 tablet, Rfl: 3   carvedilol  (COREG ) 3.125 MG tablet, TAKE ONE TABLET BY MOUTH TWICE DAILY (AM+BEDTIME), Disp: 60 tablet, Rfl: 3   cyclobenzaprine  (FLEXERIL ) 10 MG tablet, Take 1 tablet (10 mg total) by mouth 3 (three) times daily as needed for muscle spasms.,  Disp: 20 tablet, Rfl: 0   dapagliflozin  propanediol (FARXIGA ) 10 MG TABS tablet, Take 1 tablet (10 mg total) by mouth daily. Needs appt for future refills, Disp: 30 tablet, Rfl: 6   digoxin  (LANOXIN ) 0.125 MG tablet, TAKE 1 TABLET BY MOUTH DAILY, Disp: 90 tablet, Rfl: 3   doxylamine , Sleep, (UNISOM ) 25 MG tablet, Take 25 mg by mouth at bedtime as needed for sleep., Disp: , Rfl:    ELIQUIS  5 MG TABS tablet, TAKE ONE TABLET BY MOUTH TWICE DAILY (AM+BEDTIME), Disp: 60 tablet, Rfl: 6   ferrous sulfate  325 (65 FE) MG tablet, Take 325 mg by mouth daily with breakfast., Disp: , Rfl:    losartan  (COZAAR ) 25 MG tablet, Take 1 tablet (25 mg total) by mouth 2 (two) times daily., Disp: 60 tablet, Rfl: 3   Multiple Vitamin (MULTIVITAMIN WITH MINERALS) TABS, Take 1 tablet by mouth daily., Disp: , Rfl:    potassium chloride  SA (KLOR-CON  M) 20 MEQ tablet, Take 4 tablets (80 mEq total) by mouth every morning AND 4 tablets (80 mEq total) every evening., Disp: 240 tablet, Rfl: 11   spironolactone  (ALDACTONE ) 25 MG tablet, TAKE 1 TABLET BY MOUTH DAILY. HOLD IF SYSTOLIC BLOOD PRESSURE LESS THAN 100, Disp: 45 tablet, Rfl: 3   torsemide  (DEMADEX ) 20 MG tablet, TAKE 4 TABLETS BY MOUTH IN THE MORNING AND  2 TABLETS EVERY EVENING, Disp: 545 tablet, Rfl: 2 No Known Allergies    Social History   Socioeconomic History   Marital status: Single    Spouse name: Not on file   Number of children: 3   Years of education: Not on file   Highest education level: Not on file  Occupational History   Occupation: receptionist    Employer: [employer]   Occupation: office cleaning  Tobacco Use   Smoking status: Never   Smokeless tobacco: Never  Vaping Use   Vaping status: Never Used  Substance and Sexual Activity   Alcohol use: Yes    Alcohol/week: 5.0 standard drinks of alcohol    Types: 5 Glasses of wine per week    Comment: daily   Drug use: Never   Sexual activity: Yes    Birth control/protection: Condom  Other  Topics Concern   Not on file  Social History Narrative   Not on file   Social Drivers of Health   Financial Resource Strain: High Risk (12/16/2023)   Overall Financial Resource Strain (CARDIA)    Difficulty of Paying Living Expenses: Very hard  Food Insecurity: No Food Insecurity (07/02/2022)   Hunger Vital Sign    Worried About Running Out of Food in the Last Year: Never true    Ran Out of Food in the Last Year: Never true  Transportation Needs: No Transportation Needs (07/02/2022)   PRAPARE - Administrator, Civil Service (Medical): No    Lack of Transportation (Non-Medical): No  Physical Activity: Not on file  Stress: Not on file  Social Connections: Not on file  Intimate Partner Violence: Not on file    Physical Exam      Future Appointments  Date Time Provider Department Center  09/13/2024  7:05 AM [facility] DEVICE REMOTES [facility]  12/13/2024  7:05 AM [facility] DEVICE REMOTES [facility]  03/14/2025  7:05 AM [facility] DEVICE REMOTES [facility]  06/13/2025  7:05 AM [facility] DEVICE REMOTES [facility]  09/12/2025  7:05 AM [facility] DEVICE REMOTES [facility]       Izetta Quivers, Paramedic [phone] [facility] Paramedic  06/16/24

## 2024-06-21 ENCOUNTER — Telehealth: Payer: Self-pay

## 2024-06-21 NOTE — Telephone Encounter (Signed)
 Pt called in letting us  know that she is waiting on her handheld and when she gets it she will send a transmission

## 2024-06-24 ENCOUNTER — Ambulatory Visit

## 2024-06-28 ENCOUNTER — Encounter

## 2024-06-28 LAB — CUP PACEART REMOTE DEVICE CHECK
Battery Remaining Longevity: 78 mo
Battery Voltage: 2.99 V
Brady Statistic AP VP Percent: 0 %
Brady Statistic AP VS Percent: 0.14 %
Brady Statistic AS VP Percent: 0 %
Brady Statistic AS VS Percent: 99.86 %
Brady Statistic RA Percent Paced: 0.14 %
Brady Statistic RV Percent Paced: 0 %
Date Time Interrogation Session: 20250718212033
HighPow Impedance: 77 Ohm
Implantable Lead Connection Status: 753985
Implantable Lead Connection Status: 753985
Implantable Lead Implant Date: 20130906
Implantable Lead Implant Date: 20130906
Implantable Lead Location: 753859
Implantable Lead Location: 753860
Implantable Lead Model: 181
Implantable Lead Model: 5076
Implantable Lead Serial Number: 322962
Implantable Pulse Generator Implant Date: 20201207
Lead Channel Impedance Value: 361 Ohm
Lead Channel Impedance Value: 399 Ohm
Lead Channel Impedance Value: 399 Ohm
Lead Channel Pacing Threshold Amplitude: 0.5 V
Lead Channel Pacing Threshold Amplitude: 1.625 V
Lead Channel Pacing Threshold Pulse Width: 0.4 ms
Lead Channel Pacing Threshold Pulse Width: 0.4 ms
Lead Channel Sensing Intrinsic Amplitude: 17.75 mV
Lead Channel Sensing Intrinsic Amplitude: 17.75 mV
Lead Channel Sensing Intrinsic Amplitude: 2.125 mV
Lead Channel Sensing Intrinsic Amplitude: 2.125 mV
Lead Channel Setting Pacing Amplitude: 1.5 V
Lead Channel Setting Pacing Amplitude: 2.5 V
Lead Channel Setting Pacing Pulse Width: 1 ms
Lead Channel Setting Sensing Sensitivity: 0.3 mV
Zone Setting Status: 755011

## 2024-06-30 ENCOUNTER — Other Ambulatory Visit (HOSPITAL_COMMUNITY): Payer: Self-pay

## 2024-06-30 NOTE — Progress Notes (Signed)
 Telephone--pt was not able to keep appointment today.  Will f/u next week.   Izetta Quivers, EMT-Paramedic  [phone] 06/30/2024

## 2024-07-06 ENCOUNTER — Other Ambulatory Visit (HOSPITAL_COMMUNITY): Payer: Self-pay

## 2024-07-06 ENCOUNTER — Ambulatory Visit: Payer: Self-pay | Admitting: Cardiology

## 2024-07-06 NOTE — Progress Notes (Signed)
 Paramedicine Encounter    Patient ID: Sydney Shaffer, female    DOB: 1966/05/14, 58 y.o.   MRN: [id number]   Complaints-abd pain to rt lower area   Edema-none   Compliance with meds-reports so   Pill box filled-bubble packs  If so, by whom-pharmacy   Refills needed-n/a  Pt reports she is doing ok. She has been having intermittent rt lower abd area pain for the past 3 days. She is going to f/u with UC if it worsens.  She denies n/v/d. Normal BM's. No dark/black stools.  Denies increased sob, she had dizzy episode on Monday. No c/p. No palpitations.  She reports compliance with meds.  Her device check came back normal.  V/s as noted. No edema noted.  No other issues or concerns at this time.  She had to go to court on Monday about her previous rent ordeal with landlord, but they didn't show up so everything got dismissed   Will f/u in 2 wks.   She is going to call CHF clinic to get her f/u sch. She is due for August.    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   Anemia 03/29/2012    Current Outpatient Medications:    acetaminophen  (TYLENOL ) 500 MG tablet, Take 1,000 mg by mouth every 6 (six) hours as needed for moderate pain., Disp: , Rfl:    amiodarone  (PACERONE ) 200 MG tablet, TAKE 1/2 TABLET ( 100 MG ) BY MOUTH DAILIY, Disp: 45 tablet, Rfl: 3   carvedilol  (COREG ) 3.125 MG tablet, TAKE ONE TABLET BY MOUTH  TWICE DAILY (AM+BEDTIME), Disp: 60 tablet, Rfl: 3   cyclobenzaprine  (FLEXERIL ) 10 MG tablet, Take 1 tablet (10 mg total) by mouth 3 (three) times daily as needed for muscle spasms., Disp: 20 tablet, Rfl: 0   dapagliflozin  propanediol (FARXIGA ) 10 MG TABS tablet, Take 1 tablet (10 mg total) by mouth daily. Needs appt for future refills, Disp: 30 tablet, Rfl: 6   digoxin  (LANOXIN ) 0.125 MG tablet, TAKE 1 TABLET BY MOUTH DAILY, Disp: 90 tablet, Rfl: 3   doxylamine , Sleep, (UNISOM ) 25 MG tablet, Take 25 mg by mouth at bedtime as needed for sleep., Disp: , Rfl:    ELIQUIS  5 MG TABS tablet, TAKE ONE TABLET BY MOUTH TWICE DAILY (AM+BEDTIME), Disp: 60 tablet, Rfl: 6   ferrous sulfate  325 (65 FE) MG tablet, Take 325 mg by mouth daily with breakfast., Disp: , Rfl:    losartan  (COZAAR ) 25 MG tablet, Take 1 tablet (25 mg total) by mouth 2 (two) times daily., Disp: 60 tablet, Rfl: 3   Multiple Vitamin (MULTIVITAMIN WITH MINERALS) TABS, Take 1 tablet by mouth daily., Disp: , Rfl:    potassium chloride  SA (KLOR-CON  M) 20 MEQ tablet, Take 4 tablets (80 mEq total) by mouth every morning AND 4 tablets (80 mEq total) every evening., Disp: 240 tablet, Rfl: 11   spironolactone  (ALDACTONE ) 25 MG tablet, TAKE  1 TABLET BY MOUTH DAILY. HOLD IF SYSTOLIC BLOOD PRESSURE LESS THAN 100, Disp: 45 tablet, Rfl: 3   torsemide  (DEMADEX ) 20 MG tablet, TAKE 4 TABLETS BY MOUTH IN THE MORNING AND 2 TABLETS EVERY EVENING, Disp: 545 tablet, Rfl: 2 No Known Allergies    Social History   Socioeconomic History   Marital status: Single    Spouse name: Not on file   Number of children: 3   Years of education: Not on file   Highest education level: Not on file  Occupational History   Occupation: receptionist    Employer: [employer]   Occupation: office cleaning  Tobacco Use   Smoking status: Never   Smokeless tobacco: Never  Vaping Use   Vaping status: Never Used  Substance and Sexual Activity   Alcohol use: Yes     Alcohol/week: 5.0 standard drinks of alcohol    Types: 5 Glasses of wine per week    Comment: daily   Drug use: Never   Sexual activity: Yes    Birth control/protection: Condom  Other Topics Concern   Not on file  Social History Narrative   Not on file   Social Drivers of Health   Financial Resource Strain: High Risk (12/16/2023)   Overall Financial Resource Strain (CARDIA)    Difficulty of Paying Living Expenses: Very hard  Food Insecurity: No Food Insecurity (07/02/2022)   Hunger Vital Sign    Worried About Running Out of Food in the Last Year: Never true    Ran Out of Food in the Last Year: Never true  Transportation Needs: No Transportation Needs (07/02/2022)   PRAPARE - Administrator, Civil Service (Medical): No    Lack of Transportation (Non-Medical): No  Physical Activity: Not on file  Stress: Not on file  Social Connections: Not on file  Intimate Partner Violence: Not on file    Physical Exam      Future Appointments  Date Time Provider Department Center  09/23/2024  7:00 AM [facility] HVT DEVICE REMOTES [facility]  12/23/2024  7:00 AM [facility] HVT DEVICE REMOTES [facility]  03/24/2025  7:00 AM [facility] HVT DEVICE REMOTES [facility]  06/23/2025  7:00 AM [facility] HVT DEVICE REMOTES [facility]       Izetta Quivers, Paramedic [phone] [facility] Paramedic  07/06/24

## 2024-07-20 ENCOUNTER — Encounter (HOSPITAL_COMMUNITY): Payer: Self-pay | Admitting: *Deleted

## 2024-07-20 ENCOUNTER — Ambulatory Visit (HOSPITAL_COMMUNITY)
Admission: EM | Admit: 2024-07-20 | Discharge: 2024-07-20 | Disposition: A | Discharge status: Home / Self Care | Attending: Internal Medicine | Admitting: Internal Medicine

## 2024-07-20 DIAGNOSIS — R1031 Right lower quadrant pain: Secondary | ICD-10-CM

## 2024-07-20 LAB — CBC WITH DIFFERENTIAL/PLATELET
Abs Immature Granulocytes: 0.02 K/uL (ref 0.00–0.07)
Basophils Absolute: 0 K/uL (ref 0.0–0.1)
Basophils Relative: 1 %
Eosinophils Absolute: 0.2 K/uL (ref 0.0–0.5)
Eosinophils Relative: 3 %
HCT: 35.4 % — ABNORMAL LOW (ref 36.0–46.0)
Hemoglobin: 11.5 g/dL — ABNORMAL LOW (ref 12.0–15.0)
Immature Granulocytes: 0 %
Lymphocytes Relative: 38 %
Lymphs Abs: 2.3 K/uL (ref 0.7–4.0)
MCH: 27.8 pg (ref 26.0–34.0)
MCHC: 32.5 g/dL (ref 30.0–36.0)
MCV: 85.5 fL (ref 80.0–100.0)
Monocytes Absolute: 0.5 K/uL (ref 0.1–1.0)
Monocytes Relative: 9 %
Neutro Abs: 2.9 K/uL (ref 1.7–7.7)
Neutrophils Relative %: 49 %
Platelets: 263 K/uL (ref 150–400)
RBC: 4.14 MIL/uL (ref 3.87–5.11)
RDW: 13.7 % (ref 11.5–15.5)
WBC: 5.9 K/uL (ref 4.0–10.5)
nRBC: 0 % (ref 0.0–0.2)

## 2024-07-20 LAB — COMPREHENSIVE METABOLIC PANEL WITH GFR
ALT: 21 U/L (ref 0–44)
AST: 25 U/L (ref 15–41)
Albumin: 3.5 g/dL (ref 3.5–5.0)
Alkaline Phosphatase: 104 U/L (ref 38–126)
Anion gap: 9 (ref 5–15)
BUN: 16 mg/dL (ref 6–20)
CO2: 24 mmol/L (ref 22–32)
Calcium: 9.6 mg/dL (ref 8.9–10.3)
Chloride: 106 mmol/L (ref 98–111)
Creatinine, Ser: 0.82 mg/dL (ref 0.44–1.00)
GFR, Estimated: >60 mL/min (ref >60)
Glucose, Bld: 89 mg/dL (ref 70–99)
Potassium: 3.5 mmol/L (ref 3.5–5.1)
Sodium: 139 mmol/L (ref 135–145)
Total Bilirubin: 0.4 mg/dL (ref 0.0–1.2)
Total Protein: 7.2 g/dL (ref 6.5–8.1)

## 2024-07-20 LAB — POCT URINALYSIS DIP (MANUAL ENTRY)
Bilirubin, UA: NEGATIVE
Blood, UA: NEGATIVE
Glucose, UA: NEGATIVE mg/dL
Ketones, POC UA: NEGATIVE mg/dL
Leukocytes, UA: NEGATIVE
Nitrite, UA: NEGATIVE
Protein Ur, POC: NEGATIVE mg/dL
Spec Grav, UA: 1.015 (ref 1.010–1.025)
Urobilinogen, UA: 0.2 U/dL
pH, UA: 5.5 (ref 5.0–8.0)

## 2024-07-20 LAB — LIPASE, BLOOD: Lipase: 41 U/L (ref 11–51)

## 2024-07-20 NOTE — Discharge Instructions (Addendum)
 Right lower quadrant abdominal pain.  Urinalysis done today is negative.  Appendix has already been removed and there is no uterus present.  We have drawn blood work today including a complete blood count, complete metabolic panel and lipase.  This will check for kidney function, liver function, electrolytes, pancreas function and blood counts including Delwyn Scoggin blood cells and red blood cells.  These results will take approximately 24 to 48 hours.  If any results are abnormal we will contact you.  Normal results will be available on your MyChart account.  Physical exam findings and vital signs are reassuring.  Symptoms could be secondary to a musculoskeletal source or possibly adhesions from previous abdominal surgeries or potentially remaining ovary. Imaging will likely be needed if symptoms persist.  It is reasonable to have these evaluated by your primary care doctor in an outpatient setting.  Recommend scheduling an appointment as soon as possible to see them.  If symptoms worsen then recommend going to the emergency room for further evaluation.

## 2024-07-20 NOTE — ED Triage Notes (Signed)
 C/O intermittent RLQ pain onset approx 1 wk ago. Denies fevers. Denies n/v. Denies any vaginal discharge.

## 2024-07-20 NOTE — ED Provider Notes (Signed)
 [facility]    CSN: [id number] Arrival date & time: 07/20/24  1945      History   Chief Complaint Chief Complaint  Patient presents with   Abdominal Pain    HPI Sydney Shaffer is a 58 y.o. female.   58 year old female who presents urgent care with complaints of right lower quadrant abdominal pain that has going on for about a week.  She reports that the pain is relatively steady but is worse with palpation.  She reports that she has not really had significant symptoms like this in the past.  She denies any nausea, vomiting, diarrhea, dysuria, hematuria, poor appetite, weight change, fevers, chills, recent illness.  In the past she has had an appendicitis in high school and had a hysterectomy with 1 ovary removed about 20 years ago.  She does still have 1 ovary remaining.  She denies any blood per rectum or changes in her stool recently.  She has not had a colonoscopy and knows that she needs to get one.  She does have a primary care physician.   Abdominal Pain Associated symptoms: no chest pain, no chills, no constipation, no cough, no diarrhea, no dysuria, no fatigue, no fever, no hematuria, no nausea, no shortness of breath, no sore throat, no vaginal discharge and no vomiting     Past Medical History:  Diagnosis Date   Acute on chronic systolic heart failure (HCC) 05/05/2012   Anemia    felt to be due to heavy menstrual flow   Atrial tachycardia (HCC)    s/p ablation   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    Chronic systolic heart failure (HCC)    Dysrhythmia    Bradycardia   Hypertension    Hypertensive heart disease with CHF (HCC)    ICD (implantable cardiac defibrillator) in place 08/13/2012   Menorrhagia    Metrorrhagia 06/04/2015   SVT (supraventricular tachycardia) (HCC) 03/30/2014    VT (ventricular tachycardia) (HCC) 07/22/2014    Patient Active Problem List   Diagnosis Date Noted   Dilated cardiomyopathy (HCC) 06/04/2015   Hypokalemia    HTN (hypertension) 03/30/2014   Chest pressure- unspecified 02/03/2014   Atrial tachycardia (HCC) 10/01/2012   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   Hypertensive heart disease with CHF (congestive heart failure) (HCC) 08/04/2012   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   Anemia 03/29/2012    Past Surgical History:  Procedure Laterality Date   APPENDECTOMY     CARDIAC CATHETERIZATION  April 2013   normal coronaries   CARDIAC CATHETERIZATION [name] 09/12/2016   Procedure: Right/Left Heart Cath and Coronary Angiography;  Surgeon: Toribio JONELLE Fuel, MD;  Location: [facility] INVASIVE CV LAB;  Service: Cardiovascular;  Laterality: [name];   DILITATION & CURRETTAGE/HYSTROSCOPY WITH VERSAPOINT RESECTION [name] 10/05/2015   Procedure: DILATATION & CURETTAGE/HYSTEROSCOPY WITH VERSAPOINT RESECTION;  Surgeon: Percilla Burly, MD;  Location: [facility];  Service: Gynecology;  Laterality: [name];   EP study and ablation  01/07/13   Ablation of AVNRT and atrial tachycardia (arising from the anteroseptal RA 12mm above the HIS)   ICD  08/13/2012   ICD GENERATOR CHANGEOUT [name] 11/14/2019   Procedure: ICD GENERATOR CHANGEOUT;  Surgeon: Fernande Elspeth BROCKS, MD;  Location: [facility] INVASIVE CV LAB;  Service: Cardiovascular;  Laterality: [name];   IMPLANTABLE CARDIOVERTER DEFIBRILLATOR IMPLANT [name] 08/13/2012  Procedure: IMPLANTABLE CARDIOVERTER DEFIBRILLATOR IMPLANT;  Surgeon: Elspeth JAYSON Sage, MD;  Location: [facility] CATH LAB;  Service: Cardiovascular;  Laterality: [name];   LEFT HEART CATHETERIZATION WITH CORONARY ANGIOGRAM [name] 04/02/2012   Procedure: LEFT HEART CATHETERIZATION WITH CORONARY ANGIOGRAM;  Surgeon: Peter M Swaziland, MD;  Location: [facility] CATH LAB;  Service: Cardiovascular;  Laterality: [name];   RIGHT/LEFT HEART CATH AND CORONARY ANGIOGRAPHY [name] 07/01/2023    Procedure: RIGHT/LEFT HEART CATH AND CORONARY ANGIOGRAPHY;  Surgeon: Cherrie Toribio SAUNDERS, MD;  Location: [facility] INVASIVE CV LAB;  Service: Cardiovascular;  Laterality: [name];   SUPRAVENTRICULAR TACHYCARDIA ABLATION [name] 01/07/2013   Procedure: SUPRAVENTRICULAR TACHYCARDIA ABLATION;  Surgeon: Lynwood Rakers, MD;  Location: [facility] CATH LAB;  Service: Cardiovascular;  Laterality: [name];   TUBAL LIGATION      OB History   No obstetric history on file.      Home Medications    Prior to Admission medications   Medication Sig Start Date End Date Taking? Authorizing Provider  amiodarone  (PACERONE ) 200 MG tablet TAKE 1/2 TABLET ( 100 MG ) BY MOUTH DAILIY 11/04/23  Yes Bensimhon, Daniel R, MD  carvedilol  (COREG ) 3.125 MG tablet TAKE ONE TABLET BY MOUTH TWICE DAILY (AM+BEDTIME) 03/08/24  Yes Bensimhon, Toribio SAUNDERS, MD  dapagliflozin  propanediol (FARXIGA ) 10 MG TABS tablet Take 1 tablet (10 mg total) by mouth daily. Needs appt for future refills 09/15/23  Yes [place], [place], FNP  digoxin  (LANOXIN ) 0.125 MG tablet TAKE 1 TABLET BY MOUTH DAILY 03/08/24  Yes Bensimhon, Toribio SAUNDERS, MD  ELIQUIS  5 MG TABS tablet TAKE ONE TABLET BY MOUTH TWICE DAILY (AM+BEDTIME) 03/08/24  Yes Bensimhon, Toribio SAUNDERS, MD  ferrous sulfate  325 (65 FE) MG tablet Take 325 mg by mouth daily with breakfast.   Yes [provider]  losartan  (COZAAR ) 25 MG tablet Take 1 tablet (25 mg total) by mouth 2 (two) times daily. 01/15/24  Yes Bensimhon, Daniel R, MD  Multiple Vitamin (MULTIVITAMIN WITH MINERALS) TABS Take 1 tablet by mouth daily.   Yes [provider]  potassium chloride  SA (KLOR-CON  M) 20 MEQ tablet Take 4 tablets (80 mEq total) by mouth every morning AND 4 tablets (80 mEq total) every evening. 06/18/23  Yes Bensimhon, Toribio SAUNDERS, MD  spironolactone  (ALDACTONE ) 25 MG tablet TAKE 1 TABLET BY MOUTH DAILY. HOLD IF SYSTOLIC BLOOD PRESSURE LESS THAN 100 01/15/24  Yes [place], [place], FNP  torsemide  (DEMADEX ) 20 MG tablet TAKE 4 TABLETS BY MOUTH  IN THE MORNING AND 2 TABLETS EVERY EVENING 08/06/23  Yes Bensimhon, Toribio SAUNDERS, MD  acetaminophen  (TYLENOL ) 500 MG tablet Take 1,000 mg by mouth every 6 (six) hours as needed for moderate pain.    [provider]  cyclobenzaprine  (FLEXERIL ) 10 MG tablet Take 1 tablet (10 mg total) by mouth 3 (three) times daily as needed for muscle spasms. 05/14/23   Georgina Speaks, FNP  doxylamine , Sleep, (UNISOM ) 25 MG tablet Take 25 mg by mouth at bedtime as needed for sleep. 04/16/21   [provider]    Family History Family History  Problem Relation Age of Onset   Breast cancer Mother 31   Cancer Father     Social History Social History   Tobacco Use   Smoking status: Never   Smokeless tobacco: Never  Vaping Use   Vaping status: Never Used  Substance Use Topics   Alcohol use: Yes    Comment: occasionally   Drug use: Never     Allergies   Patient has no known allergies.  Review of Systems Review of Systems  Constitutional:  Negative for activity change, appetite change, chills, fatigue and fever.  HENT:  Negative for ear pain and sore throat.   Eyes:  Negative for pain and visual disturbance.  Respiratory:  Negative for cough and shortness of breath.   Cardiovascular:  Negative for chest pain and palpitations.  Gastrointestinal:  Positive for abdominal pain (Right lower quadrant). Negative for abdominal distention, blood in stool, constipation, diarrhea, nausea, rectal pain and vomiting.  Genitourinary:  Negative for decreased urine volume, difficulty urinating, dysuria, frequency, hematuria, urgency and vaginal discharge.  Musculoskeletal:  Negative for arthralgias and back pain.  Skin:  Negative for color change and rash.  Neurological:  Negative for seizures and syncope.  All other systems reviewed and are negative.    Physical Exam Triage Vital Signs ED Triage Vitals  Encounter Vitals Group     BP 07/20/24 2016 123/80     Girls Systolic BP Percentile --       Girls Diastolic BP Percentile --      Boys Systolic BP Percentile --      Boys Diastolic BP Percentile --      Pulse Rate 07/20/24 2016 84     Resp 07/20/24 2016 16     Temp 07/20/24 2016 98.1 F (36.7 C)     Temp Source 07/20/24 2016 Oral     SpO2 07/20/24 2016 98 %     Weight --      Height --      Head Circumference --      Peak Flow --      Pain Score 07/20/24 2018 4     Pain Loc --      Pain Education --      Exclude from Growth Chart --    No data found.  Updated Vital Signs BP 123/80   Pulse 84   Temp 98.1 F (36.7 C) (Oral)   Resp 16   LMP  (LMP Unknown)   SpO2 98%   Visual Acuity Right Eye Distance:   Left Eye Distance:   Bilateral Distance:    Right Eye Near:   Left Eye Near:    Bilateral Near:     Physical Exam Vitals and nursing note reviewed.  Constitutional:      General: She is not in acute distress.    Appearance: She is well-developed.  HENT:     Head: Normocephalic and atraumatic.  Eyes:     Conjunctiva/sclera: Conjunctivae normal.  Cardiovascular:     Rate and Rhythm: Normal rate and regular rhythm.     Heart sounds: No murmur heard. Pulmonary:     Effort: Pulmonary effort is normal. No respiratory distress.     Breath sounds: Normal breath sounds.  Abdominal:     General: Bowel sounds are normal. There is no distension.     Palpations: Abdomen is soft. There is no hepatomegaly or mass.     Tenderness: There is no abdominal tenderness. There is no right CVA tenderness, left CVA tenderness, guarding or rebound.     Hernia: There is no hernia in the umbilical area or ventral area.  Musculoskeletal:        General: No swelling.     Cervical back: Neck supple.  Skin:    General: Skin is warm and dry.     Capillary Refill: Capillary refill takes less than 2 seconds.  Neurological:     Mental Status: She is alert.  Psychiatric:  Mood and Affect: Mood normal.      UC Treatments / Results  Labs (all labs ordered are listed,  but only abnormal results are displayed) Labs Reviewed  CBC WITH DIFFERENTIAL/PLATELET  COMPREHENSIVE METABOLIC PANEL WITH GFR  LIPASE, BLOOD  POCT URINALYSIS DIP (MANUAL ENTRY)    EKG   Radiology No results found.  Procedures Procedures (including critical care time)  Medications Ordered in UC Medications - No data to display  Initial Impression / Assessment and Plan / UC Course  I have reviewed the triage vital signs and the nursing notes.  Pertinent labs & imaging results that were available during my care of the patient were reviewed by me and considered in my medical decision making (see chart for details).     Right lower quadrant abdominal pain   Right lower quadrant abdominal pain.  Urinalysis done today is negative.  Appendix has already been removed and there is no uterus present.  We have drawn blood work today including a complete blood count, complete metabolic panel and lipase.  This will check for kidney function, liver function, electrolytes, pancreas function and blood counts including Sydney Shaffer blood cells and red blood cells.  These results will take approximately 24 to 48 hours.  If any results are abnormal we will contact you.  Normal results will be available on your MyChart account.  Physical exam findings and vital signs are reassuring.  Symptoms could be secondary to a musculoskeletal source or possibly adhesions from previous abdominal surgeries or potentially remaining ovary. Imaging will likely be needed if symptoms persist.  It is reasonable to have these evaluated by your primary care doctor in an outpatient setting.  Recommend scheduling an appointment as soon as possible to see them.  If symptoms worsen then recommend going to the emergency room for further evaluation.  Final Clinical Impressions(s) / UC Diagnoses   Final diagnoses:  Right lower quadrant abdominal pain     Discharge Instructions      Right lower quadrant abdominal pain.  Urinalysis  done today is negative.  Appendix has already been removed and there is no uterus present.  We have drawn blood work today including a complete blood count, complete metabolic panel and lipase.  This will check for kidney function, liver function, electrolytes, pancreas function and blood counts including Sydney Shaffer blood cells and red blood cells.  These results will take approximately 24 to 48 hours.  If any results are abnormal we will contact you.  Normal results will be available on your MyChart account.  Physical exam findings and vital signs are reassuring.  Symptoms could be secondary to a musculoskeletal source or possibly adhesions from previous abdominal surgeries or potentially remaining ovary. Imaging will likely be needed if symptoms persist.  It is reasonable to have these evaluated by your primary care doctor in an outpatient setting.  Recommend scheduling an appointment as soon as possible to see them.  If symptoms worsen then recommend going to the emergency room for further evaluation.    ED Prescriptions   None    PDMP not reviewed this encounter.   Teresa Almarie LABOR, [place] 07/20/24 2046

## 2024-07-21 ENCOUNTER — Other Ambulatory Visit (HOSPITAL_COMMUNITY): Payer: Self-pay

## 2024-07-21 ENCOUNTER — Ambulatory Visit (HOSPITAL_COMMUNITY): Payer: Self-pay

## 2024-07-21 NOTE — Progress Notes (Signed)
 Paramedicine Encounter    Patient ID: Sydney Shaffer, female    DOB: 1966-11-06, 58 y.o.   MRN: [id number]   Complaints-went to [facility] last night for abd pain   Edema-none  Compliance with meds-yes  Pill box filled-bubble packs  If so, by whom-pharmacy   Refills needed-n/a  Pt reports she ended up going to UC yesterday for the abd pain that has been occurring. No new findings, she feels better today-there was no intervention treatment noted.  I wonder if her pain is coming from gyno issues-she needs to find office that takes her medicaid.  She also needs to get back with pcp for f/u. I will send jane message-pt prefers to go to [facility] office instead of [facility]. I sent slater a message to see if she can be seen there.   She also reports that her food stamps got cut from $290>>25~  She is concerned with her medicaid being cut off also. I asked jenna to call her to help her walk thru this and her options.   Pt denies increased sob, no more than usual, she did report she had dizzy spell earlier when carrying the trash out.  No c/p, no edema noted.  Appetite ok, no bleeding issues.  Labs were done at [facility] yesterday.   She is on bubble packs.  She reports compliance with meds.  Will f/u in a couple wks and will relay any messages I get from [facility] ref appointment.   BP 104/70   Pulse 82   Resp 16   LMP  (LMP Unknown)   SpO2 98%  Weight yesterday-? 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   Anemia 03/29/2012     Current Outpatient Medications:    acetaminophen  (TYLENOL ) 500 MG tablet, Take 1,000 mg by mouth every 6 (six) hours as needed for moderate pain., Disp: , Rfl:    amiodarone  (PACERONE ) 200 MG tablet, TAKE 1/2 TABLET ( 100 MG ) BY MOUTH DAILIY, Disp: 45 tablet, Rfl: 3   carvedilol  (COREG ) 3.125 MG tablet, TAKE ONE TABLET BY MOUTH TWICE DAILY (AM+BEDTIME), Disp: 60 tablet, Rfl: 3   cyclobenzaprine  (FLEXERIL ) 10 MG tablet, Take 1 tablet (10 mg total) by mouth 3 (three) times daily as needed for muscle spasms., Disp: 20 tablet, Rfl: 0   dapagliflozin  propanediol (FARXIGA ) 10 MG TABS tablet, Take 1 tablet (10 mg total) by mouth daily. Needs appt for future refills, Disp: 30 tablet, Rfl: 6   digoxin  (LANOXIN ) 0.125 MG tablet, TAKE 1 TABLET BY MOUTH DAILY, Disp: 90 tablet, Rfl: 3   doxylamine , Sleep, (UNISOM ) 25 MG tablet, Take 25 mg by mouth at bedtime as needed for sleep., Disp: , Rfl:    ELIQUIS  5 MG TABS tablet, TAKE ONE TABLET BY MOUTH TWICE DAILY (AM+BEDTIME), Disp: 60 tablet, Rfl: 6   ferrous sulfate  325 (65 FE) MG tablet, Take 325 mg by mouth daily with breakfast., Disp: , Rfl:    losartan  (COZAAR ) 25 MG tablet, Take 1 tablet (25 mg total) by mouth 2 (two) times daily., Disp: 60 tablet, Rfl: 3  Multiple Vitamin (MULTIVITAMIN WITH MINERALS) TABS, Take 1 tablet by mouth daily., Disp: , Rfl:    potassium chloride  SA (KLOR-CON  M) 20 MEQ tablet, Take 4 tablets (80 mEq total) by mouth every morning AND 4 tablets (80 mEq total) every evening., Disp: 240 tablet, Rfl: 11   spironolactone  (ALDACTONE ) 25 MG tablet, TAKE 1 TABLET BY MOUTH DAILY. HOLD IF SYSTOLIC BLOOD PRESSURE LESS THAN 100, Disp: 45 tablet, Rfl: 3   torsemide  (DEMADEX ) 20 MG tablet, TAKE 4 TABLETS BY MOUTH IN THE MORNING AND 2 TABLETS EVERY EVENING, Disp: 545 tablet, Rfl: 2 No Known Allergies    Social History   Socioeconomic History   Marital status: Single    Spouse name: Not on file   Number of children: 3   Years of  education: Not on file   Highest education level: Not on file  Occupational History   Occupation: Database administrator: [employer]   Occupation: office cleaning  Tobacco Use   Smoking status: Never   Smokeless tobacco: Never  Vaping Use   Vaping status: Never Used  Substance and Sexual Activity   Alcohol use: Yes    Comment: occasionally   Drug use: Never   Sexual activity: Not Currently  Other Topics Concern   Not on file  Social History Narrative   Not on file   Social Drivers of Health   Financial Resource Strain: High Risk (12/16/2023)   Overall Financial Resource Strain (CARDIA)    Difficulty of Paying Living Expenses: Very hard  Food Insecurity: No Food Insecurity (07/02/2022)   Hunger Vital Sign    Worried About Running Out of Food in the Last Year: Never true    Ran Out of Food in the Last Year: Never true  Transportation Needs: No Transportation Needs (07/02/2022)   PRAPARE - Administrator, Civil Service (Medical): No    Lack of Transportation (Non-Medical): No  Physical Activity: Not on file  Stress: Not on file  Social Connections: Not on file  Intimate Partner Violence: Not on file    Physical Exam      Future Appointments  Date Time Provider Department Center  09/23/2024  7:00 AM CVD HVT DEVICE REMOTES [facility]  12/23/2024  7:00 AM CVD HVT DEVICE REMOTES [facility]  03/24/2025  7:00 AM [facility] HVT DEVICE REMOTES [facility]  06/23/2025  7:00 AM CVD HVT DEVICE REMOTES [facility]       Izetta Quivers, Paramedic [phone] [facility] Paramedic  07/21/24

## 2024-07-22 ENCOUNTER — Telehealth (HOSPITAL_COMMUNITY): Payer: Self-pay | Admitting: Licensed Clinical Social Worker

## 2024-07-22 NOTE — Telephone Encounter (Signed)
 H&V Care Navigation CSW Progress Note  Clinical Social Worker informed by paramedic that pt wanting to speak with CSW about concerns with losing Medicaid.  Pt reports she gets $1,619/month from disability and works a part time job getting $400/month.  CSW explained this is over the $1,732/month limit for a single adult for Medicaid so she would presumably lose her insurance next time it was reviewed.  Pt awaiting transplant work up and needs insurance to be considered for this- she will plan to stop her part time work.  Patient is participating in a Managed Medicaid Plan:  Yes  SDOH Screenings   Food Insecurity: No Food Insecurity (07/02/2022)  Housing: High Risk (01/12/2024)  Transportation Needs: No Transportation Needs (07/02/2022)  Utilities: At Risk (01/28/2024)  Depression (PHQ2-9): Medium Risk (05/14/2023)  Financial Resource Strain: High Risk (07/22/2024)  Tobacco Use: Low Risk  (07/20/2024)   Andriette HILARIO Leech, LCSW Clinical Social Worker [facility] Desk#: [phone] Cell#: [phone]

## 2024-07-30 ENCOUNTER — Other Ambulatory Visit (HOSPITAL_COMMUNITY): Payer: Self-pay | Admitting: Internal Medicine

## 2024-07-30 ENCOUNTER — Other Ambulatory Visit (HOSPITAL_COMMUNITY): Payer: Self-pay | Admitting: Family Medicine

## 2024-07-30 DIAGNOSIS — I420 Dilated cardiomyopathy: Secondary | ICD-10-CM

## 2024-08-04 ENCOUNTER — Telehealth (HOSPITAL_COMMUNITY): Payer: Self-pay

## 2024-08-04 NOTE — Telephone Encounter (Signed)
 Reached out to pt to confirm todays appoint and she asked to resch for next week.  Will f/u then.   Izetta Quivers, EMT-Paramedic  [phone] 08/04/2024

## 2024-08-11 ENCOUNTER — Other Ambulatory Visit (HOSPITAL_COMMUNITY): Payer: Self-pay

## 2024-08-11 ENCOUNTER — Telehealth (HOSPITAL_COMMUNITY): Payer: Self-pay | Admitting: Licensed Clinical Social Worker

## 2024-08-11 NOTE — Telephone Encounter (Signed)
 H&V Care Navigation CSW Progress Note  Clinical Social Worker informed by [employer] that pt in need of food assistance at this time.  CSW provided Heart and Vascular Food Bag as well as list of local food pantries and [facility] food pantry referral.  Provided to paramedic to take to patient.  Patient is participating in a Managed Medicaid Plan:  Yes  SDOH Screenings   Food Insecurity: Food Insecurity Present (08/11/2024)  Housing: High Risk (01/12/2024)  Transportation Needs: No Transportation Needs (07/02/2022)  Utilities: At Risk (01/28/2024)  Depression (PHQ2-9): Medium Risk (05/14/2023)  Financial Resource Strain: High Risk (07/22/2024)  Tobacco Use: Low Risk  (07/20/2024)   Andriette HILARIO Leech, LCSW Clinical Social Worker [facility] Desk#: [phone] Cell#: [phone]

## 2024-08-11 NOTE — Progress Notes (Signed)
 Paramedicine Encounter    Patient ID: Sydney Shaffer, female    DOB: January 08, 1966, 58 y.o.   MRN: [id number]   Complaints-stress-financial   Edema-yes to legs  Compliance with meds-yes  Pill box filled-pharmacy bubble packs  If so, by whom-pharmacy   Refills needed-just got delivered  Pt reports physically she is doing ok.  She reports she got out of breath mon and tues walking to dumpster and back, she reports probably weight was up some, possibly fluid overload related.  By Wednesday she felt better  She does have some edema noted to her lower legs.  Her appetite is very high.  Drinking fluids, advised her to cut back on salt intake also.   She has financial issues and that has been stressful for her.  She needs her car repaired which will be $1000, her food stamps have been cut back and she is concerned about her medicaid, so she is stopping her part time job.  She is going to social services next week to get better idea on her limits.  She also asked me earlier for food pantry bag from clinic so I went by to get that for her also food pantry info and referral to [facility].   She is going to call PCP office at [facility] to get back in there with them.   Will f/u in a couple wks.   BP 128/78   Pulse 76   Resp 15   LMP  (LMP Unknown)   SpO2 97%  Weight yesterday-? 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   Anemia 03/29/2012    Current Outpatient Medications:    acetaminophen  (TYLENOL ) 500 MG tablet, Take  1,000 mg by mouth every 6 (six) hours as needed for moderate pain., Disp: , Rfl:    amiodarone  (PACERONE ) 200 MG tablet, TAKE 1/2 TABLET ( 100 MG ) BY MOUTH DAILIY, Disp: 45 tablet, Rfl: 3   carvedilol  (COREG ) 3.125 MG tablet, TAKE ONE TABLET BY MOUTH TWICE DAILY (AM+BEDTIME), Disp: 60 tablet, Rfl: 3   cyclobenzaprine  (FLEXERIL ) 10 MG tablet, Take 1 tablet (10 mg total) by mouth 3 (three) times daily as needed for muscle spasms., Disp: 20 tablet, Rfl: 0   dapagliflozin  propanediol (FARXIGA ) 10 MG TABS tablet, TAKE 1 TABLET (10 MG TOTAL) BY MOUTH DAILY., Disp: 30 tablet, Rfl: 6   digoxin  (LANOXIN ) 0.125 MG tablet, TAKE 1 TABLET BY MOUTH DAILY, Disp: 90 tablet, Rfl: 3   doxylamine , Sleep, (UNISOM ) 25 MG tablet, Take 25 mg by mouth at bedtime as needed for sleep., Disp: , Rfl:    ELIQUIS  5 MG TABS tablet, TAKE ONE TABLET BY MOUTH TWICE DAILY (AM+BEDTIME), Disp: 60 tablet, Rfl: 6   ferrous sulfate  325 (65 FE) MG tablet, Take 325 mg by mouth daily with breakfast., Disp: , Rfl:    losartan  (COZAAR ) 25 MG tablet, TAKE ONE TABLET BY MOUTH TWICE DAILY (AM+BEDTIME), Disp: 60 tablet, Rfl: 3   Multiple Vitamin (MULTIVITAMIN WITH MINERALS) TABS, Take 1 tablet by mouth daily., Disp: , Rfl:    potassium chloride  SA (KLOR-CON  M) 20 MEQ  tablet, TAKE 4 TABS EVERY MORNING AND 4 TABS EVERY EVENING (2AM+2NOON+2PM+2BEDTIME), Disp: 240 tablet, Rfl: 11   spironolactone  (ALDACTONE ) 25 MG tablet, TAKE 1 TABLET BY MOUTH DAILY. HOLD IF SYSTOLIC BLOOD PRESSURE LESS THAN 100, Disp: 45 tablet, Rfl: 3   torsemide  (DEMADEX ) 20 MG tablet, TAKE 4 TABLETS BY MOUTH IN THE MORNING AND 2 TABLETS EVERY EVENING (4AM+2NOON), Disp: 545 tablet, Rfl: 2 No Known Allergies    Social History   Socioeconomic History   Marital status: Single    Spouse name: Not on file   Number of children: 3   Years of education: Not on file   Highest education level: Not on file  Occupational History   Occupation: Database administrator: [employer]    Occupation: office cleaning  Tobacco Use   Smoking status: Never   Smokeless tobacco: Never  Vaping Use   Vaping status: Never Used  Substance and Sexual Activity   Alcohol use: Yes    Comment: occasionally   Drug use: Never   Sexual activity: Not Currently  Other Topics Concern   Not on file  Social History Narrative   Not on file   Social Drivers of Health   Financial Resource Strain: High Risk (07/22/2024)   Overall Financial Resource Strain (CARDIA)    Difficulty of Paying Living Expenses: Hard  Food Insecurity: Food Insecurity Present (08/11/2024)   Hunger Vital Sign    Worried About Running Out of Food in the Last Year: Sometimes true    Ran Out of Food in the Last Year: Sometimes true  Transportation Needs: No Transportation Needs (07/02/2022)   PRAPARE - Administrator, Civil Service (Medical): No    Lack of Transportation (Non-Medical): No  Physical Activity: Not on file  Stress: Not on file  Social Connections: Not on file  Intimate Partner Violence: Not on file    Physical Exam      Future Appointments  Date Time Provider Department Center  09/23/2024  7:00 AM CVD HVT DEVICE REMOTES [facility]  12/23/2024  7:00 AM CVD HVT DEVICE REMOTES [facility]  03/24/2025  7:00 AM CVD HVT DEVICE REMOTES [facility]  06/23/2025  7:00 AM CVD HVT DEVICE REMOTES [facility]       Izetta Quivers, Paramedic [phone] [facility] Paramedic  08/11/24

## 2024-08-25 ENCOUNTER — Other Ambulatory Visit (HOSPITAL_COMMUNITY): Payer: Self-pay

## 2024-08-25 NOTE — Progress Notes (Signed)
 Paramedicine Encounter    Patient ID: Sydney Shaffer, female    DOB: 01-04-1966, 58 y.o.   MRN: [id number]   Complaints-none right now   Edema-none   Compliance with meds-yes  Pill box filled-n/a  If so, by whom-pharm bubble packs   Refills needed-n/a      Pt reports she is doing ok, just really worried about if she will have medicaid reinstated or not at end of this year.   She denies increased sob, dizziness off and on as usual, no c/p, no edema noted.  She did report that she had fluttering a few days ago that lasted about 2-3 min and it went away on its own and none since. HR normal at this time. No complaints presently.  I asked her to send in remote transmission and I sent note to device staff nurse to [facility] for it and reported that she had fluttering a few days ago.  She is on bubble packs. Reports missed her afternoon dose of torsemide  due to out running errands.  Not sure if she really weighing daily-last visit she reported to me 181 but today 198 so not sure that is accurate???  Quite a big difference and she surely doesn't look like she's up that much and no complaints.   Will f/u in couple wks unless needed otherwise.    BP 110/70   Pulse 84   Resp 16   Wt 198 lb (89.8 kg)   LMP  (LMP Unknown)   SpO2 98%   BMI 31.96 kg/m  Weight yesterday--198-is she weighing daily??  Last visit weight-?  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   Anemia 03/29/2012    Current Outpatient Medications:    acetaminophen  (TYLENOL ) 500 MG  tablet, Take 1,000 mg by mouth every 6 (six) hours as needed for moderate pain., Disp: , Rfl:    amiodarone  (PACERONE ) 200 MG tablet, TAKE 1/2 TABLET ( 100 MG ) BY MOUTH DAILIY, Disp: 45 tablet, Rfl: 3   carvedilol  (COREG ) 3.125 MG tablet, TAKE ONE TABLET BY MOUTH TWICE DAILY (AM+BEDTIME), Disp: 60 tablet, Rfl: 3   cyclobenzaprine  (FLEXERIL ) 10 MG tablet, Take 1 tablet (10 mg total) by mouth 3 (three) times daily as needed for muscle spasms., Disp: 20 tablet, Rfl: 0   dapagliflozin  propanediol (FARXIGA ) 10 MG TABS tablet, TAKE 1 TABLET (10 MG TOTAL) BY MOUTH DAILY., Disp: 30 tablet, Rfl: 6   digoxin  (LANOXIN ) 0.125 MG tablet, TAKE 1 TABLET BY MOUTH DAILY, Disp: 90 tablet, Rfl: 3   doxylamine , Sleep, (UNISOM ) 25 MG tablet, Take 25 mg by mouth at bedtime as needed for sleep., Disp: , Rfl:    ELIQUIS  5 MG TABS tablet, TAKE ONE TABLET BY MOUTH TWICE DAILY (AM+BEDTIME), Disp: 60 tablet, Rfl: 6   ferrous sulfate  325 (65 FE) MG tablet, Take 325 mg by mouth daily with breakfast., Disp: , Rfl:    losartan  (COZAAR ) 25 MG tablet, TAKE ONE TABLET BY MOUTH TWICE DAILY (AM+BEDTIME), Disp: 60 tablet, Rfl: 3   Multiple Vitamin (MULTIVITAMIN WITH MINERALS) TABS, Take 1 tablet by mouth daily., Disp: , Rfl:  potassium chloride  SA (KLOR-CON  M) 20 MEQ tablet, TAKE 4 TABS EVERY MORNING AND 4 TABS EVERY EVENING (2AM+2NOON+2PM+2BEDTIME), Disp: 240 tablet, Rfl: 11   spironolactone  (ALDACTONE ) 25 MG tablet, TAKE 1 TABLET BY MOUTH DAILY. 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   Occupation: office cleaning  Tobacco Use   Smoking status: Never   Smokeless tobacco: Never  Vaping Use   Vaping status: Never Used  Substance and Sexual Activity   Alcohol use: Yes    Comment: occasionally   Drug use: Never   Sexual activity: Not Currently  Other Topics Concern   Not on file  Social History Narrative   Not on file   Social Drivers of Health   Financial Resource Strain: High Risk (07/22/2024)   Overall Financial Resource Strain (CARDIA)    Difficulty of Paying Living Expenses: Hard  Food Insecurity: Food Insecurity Present (08/11/2024)   Hunger Vital Sign    Worried About Running Out of Food in the Last Year: Sometimes true    Ran Out of Food in the Last Year: Sometimes true  Transportation Needs: No Transportation Needs (07/02/2022)   PRAPARE - Administrator, Civil Service (Medical): No    Lack of Transportation (Non-Medical): No  Physical Activity: Not on file  Stress: Not on file  Social Connections: Not on file  Intimate Partner Violence: Not on file    Physical Exam      Future Appointments  Date Time Provider Department Center  09/20/2024  9:40 AM Bensimhon, Toribio SAUNDERS, MD [facility] None  09/23/2024  7:00 AM CVD HVT DEVICE REMOTES [facility]  12/23/2024  7:00 AM CVD HVT DEVICE REMOTES [facility]  03/24/2025  7:00 AM CVD HVT DEVICE REMOTES [facility]  06/23/2025  7:00 AM CVD HVT DEVICE REMOTES [facility]V       Izetta Quivers, Paramedic [phone] [facility] Paramedic  08/25/24

## 2024-09-08 ENCOUNTER — Encounter

## 2024-09-08 ENCOUNTER — Other Ambulatory Visit (HOSPITAL_COMMUNITY): Payer: Self-pay

## 2024-09-08 NOTE — Progress Notes (Signed)
 Paramedicine Encounter    Patient ID: Sydney Shaffer, female    DOB: 01/12/1966, 58 y.o.   MRN: [id number]   Complaints-denies presently-had chest tightness earlier in the week   Edema-slight   Compliance with meds-reports yes   Pill box filled-n/a  If so, by whom-n/a   Refills needed-on bubble packs     Pt reports she is doing ok. She reports having some chest tightness on Tuesday this week, that went away with tylenol  and rest.  She reported that the [facility] pant[facility] is not accepting new clients at this time.   She denies increased sob, some dizziness, no issues with meds.  Appetite not so good lately, she is stressed about medicaid approval when its time to recertify with income limits. She is getting nowhere when trying to go to DSS and figure it out, long waits and they wont call her back.   Reminded her appoint in clinic in 2 wks.  Will f/u after that.   BP 116/70   Pulse 86   Resp 15   Wt 190 lb (86.2 kg)   LMP  (LMP Unknown)   SpO2 98%   BMI 30.67 kg/m  Weight yesterday-? Last visit weight-181>>198???  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   Anemia 03/29/2012    Current Outpatient Medications:    acetaminophen  (TYLENOL ) 500 MG tablet, Take 1,000 mg by mouth every 6 (six) hours as needed for moderate pain., Disp: , Rfl:    amiodarone  (PACERONE ) 200 MG tablet, TAKE 1/2 TABLET ( 100 MG ) BY MOUTH DAILIY, Disp: 45 tablet, Rfl: 3   carvedilol  (COREG ) 3.125 MG tablet, TAKE ONE TABLET BY MOUTH TWICE DAILY (AM+BEDTIME), Disp: 60  tablet, Rfl: 3   cyclobenzaprine  (FLEXERIL ) 10 MG tablet, Take 1 tablet (10 mg total) by mouth 3 (three) times daily as needed for muscle spasms., Disp: 20 tablet, Rfl: 0   dapagliflozin  propanediol (FARXIGA ) 10 MG TABS tablet, TAKE 1 TABLET (10 MG TOTAL) BY MOUTH DAILY., Disp: 30 tablet, Rfl: 6   digoxin  (LANOXIN ) 0.125 MG tablet, TAKE 1 TABLET BY MOUTH DAILY, Disp: 90 tablet, Rfl: 3   doxylamine , Sleep, (UNISOM ) 25 MG tablet, Take 25 mg by mouth at bedtime as needed for sleep., Disp: , Rfl:    ELIQUIS  5 MG TABS tablet, TAKE ONE TABLET BY MOUTH TWICE DAILY (AM+BEDTIME), Disp: 60 tablet, Rfl: 6   ferrous sulfate  325 (65 FE) MG tablet, Take 325 mg by mouth daily with breakfast., Disp: , Rfl:    losartan  (COZAAR ) 25 MG tablet, TAKE ONE TABLET BY MOUTH TWICE DAILY (AM+BEDTIME), Disp: 60 tablet, Rfl: 3   Multiple Vitamin (MULTIVITAMIN WITH MINERALS) TABS, Take 1 tablet by mouth daily., Disp: , Rfl:    potassium chloride  SA (KLOR-CON  M) 20 MEQ tablet, TAKE 4 TABS EVERY MORNING AND 4 TABS EVERY EVENING (2AM+2NOON+2PM+2BEDTIME), Disp: 240 tablet, Rfl: 11   spironolactone  (ALDACTONE ) 25 MG tablet, TAKE 1 TABLET BY MOUTH DAILY. HOLD IF SYSTOLIC BLOOD PRESSURE LESS THAN 100, Disp: 45 tablet, Rfl: 3   torsemide  (DEMADEX ) 20 MG tablet, TAKE 4 TABLETS BY MOUTH IN THE MORNING  AND 2 TABLETS EVERY EVENING (4AM+2NOON), Disp: 545 tablet, Rfl: 2 No Known Allergies    Social History   Socioeconomic History   Marital status: Single    Spouse name: Not on file   Number of children: 3   Years of education: Not on file   Highest education level: Not on file  Occupational History   Occupation: Database administrator: [employer]   Occupation: office cleaning  Tobacco Use   Smoking status: Never   Smokeless tobacco: Never  Vaping Use   Vaping status: Never Used  Substance and Sexual Activity   Alcohol use: Yes    Comment: occasionally   Drug use: Never   Sexual activity: Not Currently  Other Topics  Concern   Not on file  Social History Narrative   Not on file   Social Drivers of Health   Financial Resource Strain: High Risk (07/22/2024)   Overall Financial Resource Strain (CARDIA)    Difficulty of Paying Living Expenses: Hard  Food Insecurity: Food Insecurity Present (08/11/2024)   Hunger Vital Sign    Worried About Running Out of Food in the Last Year: Sometimes true    Ran Out of Food in the Last Year: Sometimes true  Transportation Needs: No Transportation Needs (07/02/2022)   PRAPARE - Administrator, Civil Service (Medical): No    Lack of Transportation (Non-Medical): No  Physical Activity: Not on file  Stress: Not on file  Social Connections: Not on file  Intimate Partner Violence: Not on file    Physical Exam      Future Appointments  Date Time Provider Department Center  09/20/2024  9:40 AM Bensimhon, Toribio SAUNDERS, MD [facility] None  09/23/2024  7:00 AM CVD HVT DEVICE REMOTES [facility]  12/23/2024  7:00 AM CVD HVT DEVICE REMOTES [facility]V  03/24/2025  7:00 AM CVD HVT DEVICE REMOTES [facility]  06/23/2025  7:00 AM CVD HVT DEVICE REMOTES [facility]V       Izetta Quivers, Paramedic [phone] [facility] Paramedic  09/08/24

## 2024-09-13 ENCOUNTER — Encounter

## 2024-09-20 ENCOUNTER — Other Ambulatory Visit (HOSPITAL_COMMUNITY): Payer: Self-pay

## 2024-09-20 ENCOUNTER — Ambulatory Visit (HOSPITAL_COMMUNITY)
Admission: RE | Admit: 2024-09-20 | Discharge: 2024-09-20 | Disposition: A | Discharge status: Home / Self Care | Source: Ambulatory Visit | Attending: Internal Medicine | Admitting: Internal Medicine

## 2024-09-20 VITALS — BP 108/78 | HR 79 | Wt 195.0 lb

## 2024-09-20 DIAGNOSIS — Z95810 Presence of automatic (implantable) cardiac defibrillator: Secondary | ICD-10-CM | POA: Diagnosis not present

## 2024-09-20 DIAGNOSIS — I110 Hypertensive heart disease with heart failure: Secondary | ICD-10-CM | POA: Insufficient documentation

## 2024-09-20 DIAGNOSIS — Z79899 Other long term (current) drug therapy: Secondary | ICD-10-CM | POA: Diagnosis not present

## 2024-09-20 DIAGNOSIS — Z7984 Long term (current) use of oral hypoglycemic drugs: Secondary | ICD-10-CM | POA: Diagnosis not present

## 2024-09-20 DIAGNOSIS — I428 Other cardiomyopathies: Secondary | ICD-10-CM | POA: Insufficient documentation

## 2024-09-20 DIAGNOSIS — I471 Supraventricular tachycardia, unspecified: Secondary | ICD-10-CM

## 2024-09-20 DIAGNOSIS — Z7901 Long term (current) use of anticoagulants: Secondary | ICD-10-CM | POA: Diagnosis not present

## 2024-09-20 DIAGNOSIS — I472 Ventricular tachycardia, unspecified: Secondary | ICD-10-CM

## 2024-09-20 DIAGNOSIS — I5022 Chronic systolic (congestive) heart failure: Secondary | ICD-10-CM | POA: Diagnosis not present

## 2024-09-20 LAB — COMPREHENSIVE METABOLIC PANEL WITH GFR
ALT: 22 U/L (ref 0–44)
AST: 23 U/L (ref 15–41)
Albumin: 3.9 g/dL (ref 3.5–5.0)
Alkaline Phosphatase: 106 U/L (ref 38–126)
Anion gap: 11 (ref 5–15)
BUN: 12 mg/dL (ref 6–20)
CO2: 28 mmol/L (ref 22–32)
Calcium: 9.6 mg/dL (ref 8.9–10.3)
Chloride: 99 mmol/L (ref 98–111)
Creatinine, Ser: 0.96 mg/dL (ref 0.44–1.00)
GFR, Estimated: >60 mL/min (ref >60)
Glucose, Bld: 93 mg/dL (ref 70–99)
Potassium: 3.7 mmol/L (ref 3.5–5.1)
Sodium: 138 mmol/L (ref 135–145)
Total Bilirubin: 0.9 mg/dL (ref 0.0–1.2)
Total Protein: 7.9 g/dL (ref 6.5–8.1)

## 2024-09-20 LAB — TSH: TSH: 5.604 u[IU]/mL — ABNORMAL HIGH (ref 0.350–4.500)

## 2024-09-20 LAB — BRAIN NATRIURETIC PEPTIDE: B Natriuretic Peptide: 235.3 pg/mL — ABNORMAL HIGH (ref 0.0–100.0)

## 2024-09-20 LAB — DIGOXIN LEVEL: Digoxin Level: <0.6 ng/mL — ABNORMAL LOW (ref 0.8–2.0)

## 2024-09-20 LAB — T4, FREE: Free T4: 0.77 ng/dL (ref 0.61–1.12)

## 2024-09-20 MED ORDER — AMIODARONE HCL 200 MG PO TABS: 100.0000 mg | ORAL_TABLET | ORAL | 3 refills | Status: AC

## 2024-09-20 NOTE — Patient Instructions (Signed)
 CHANGE Amiodarone  to 100 mg every Monday, Wednesday and Friday.  Labs done today, your results will be available in MyChart, we will contact you for abnormal readings.  Your physician has requested that you have an echocardiogram. Echocardiography is a painless test that uses sound waves to create images of your heart. It provides your doctor with information about the size and shape of your heart and how well your heart's chambers and valves are working. This procedure takes approximately one hour. There are no restrictions for this procedure. Please do NOT wear cologne, perfume, aftershave, or lotions (deodorant is allowed). Please arrive 15 minutes prior to your appointment time.  Please note: We ask at that you not bring children with you during ultrasound (echo/ vascular) testing. Due to room size and safety concerns, children are not allowed in the ultrasound rooms during exams. Our front office staff cannot provide observation of children in our lobby area while testing is being conducted. An adult accompanying a patient to their appointment will only be allowed in the ultrasound room at the discretion of the ultrasound technician under special circumstances. We apologize for any inconvenience.  Your physician recommends that you schedule a follow-up appointment in: 6 months (April 2026) ** PLEASE CALL THE OFFICE IN FEBRUARY 2026 TO ARRANGE YOUR FOLLOW UP APPOINTMENT.**  If you have any questions or concerns before your next appointment please send us  a message through [place] or call our office at [phone].    TO LEAVE A MESSAGE FOR THE NURSE SELECT OPTION 2, PLEASE LEAVE A MESSAGE INCLUDING: YOUR NAME DATE OF BIRTH CALL BACK NUMBER REASON FOR CALL**this is important as we prioritize the call backs  YOU WILL RECEIVE A CALL BACK THE SAME DAY AS LONG AS YOU CALL BEFORE 4:00 PM  At the [facility], you and your health needs are our priority. As part of our continuing  mission to provide you with exceptional heart care, we have created designated Provider Care Teams. These Care Teams include your primary Cardiologist (physician) and Advanced Practice Providers (APPs- Physician Assistants and Nurse Practitioners) who all work together to provide you with the care you need, when you need it.   You may see any of the following providers on your designated Care Team at your next follow up: Dr Toribio Fuel Dr Ezra Shuck Dr. Ria Commander Dr. Morene Brownie Amy Lenetta, NP Caffie Shed, [place] [facility], [place] Beckey Coe, NP [place] Lee, NP Ellouise Class, NP Tinnie Redman, PharmD Jaun Bash, PharmD   Please be sure to bring in all your medications bottles to every appointment.    Thank you for choosing [facility]

## 2024-09-20 NOTE — Progress Notes (Signed)
 [facility] Note   PCP: Lum Dross, PA-C ([facility]) EP: Dr Fernande Medtronic ICD HF Cardiologist: Dr. Cherrie  HPI: Sydney Shaffer is a 58 y.o. emale with chronic systolic HF due to NICM s/p Medtronic ICD, HTN, and SVT s/p ablation 2/1  Cath 4/13 and 10/17 and 7/24 normal coronaries.  Echo 6/19 EF 30-35% mild MR. S/p ICD gen change 12/20.  Reestablished with HF clinic in April 2023.    Echo 4/23: EF 25-30%, LV severely dilated, grade III DD, RV okay, RVSP 49 mmHg, moderate MR, mild to moderate TR   Admitted 7/23 with ADHF after being out of meds. Referred for paramedicine.  Amiodarone  increased d/t episodes of VT (monitor only) and runs SVT and AF.   CPX 4/24  FVC 1.67 (52%)      FEV1 1.37 (54%)        FEV1/FVC 82 (103%)        MVV 50 (49%)      BP rest: 88/68 ->  BP peak: 122/68  pVO2: 12.4 (55% predicted peak VO2) VE/VCO2 slope:  31  Peak RER: 1.12  VE/MVV:  79% (VE/FEV1*40 = 72%)   Echo  03/02/23 EF 20-25% RV mild HK   R/L cath 07/01/23 Normal coronaries Ao = 89/68 (80) LV = 105/28 RA = 4 RV = 47/6 PA = 46/20 (31) PCW = 19 (v = 25-30) Fick cardiac output/index =4.5/2.3 Thermo CP/CI = 3.9/2.0 PVR = 2.6 WU Ao sat = 91% PA sat = 62%, 64%  Echo 01/15/24 EF 25% + LVNC RV ok   Today she returns for HF follow up with Katie from Paramedicine. 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 Can do all ADLs as long as she takes her time. Can go to the store as well. SOB with increased exertion. Occasional ankle edema. No orthopnea or PND. Compliant with meds.    Cardiac Studies: - Echo 2/25: EF 25% + LVNC RV ok   - Echo (4/23): EF 25-30% - Echo (10/17): EF 25-30%, Grade 1 DD - Echo (6/16): EF 30-35% mild MR - Echo (2/15): EF 20-25%  - Echo (7/13): EF 20-25%   R/LHC (10/17): normal cors  CPX (5/18)  Peak VO2: 12.3 (61% predicted peak VO2) VE/VCO2 slope:  25 Peak RER: 0.98   SH: She is not a smoker. She does not drink alcohol. Lives alone.  She has 3 grown children   FH: Mom breast cancer. Father cancer.   ROS: All systems negative except as listed in HPI, PMH and Problem List.  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    Chronic systolic heart failure (HCC)    Dysrhythmia    Bradycardia   Hypertension    Hypertensive heart disease with CHF (HCC)    ICD (implantable cardiac defibrillator) in place 08/13/2012   Menorrhagia    Metrorrhagia 06/04/2015   SVT (supraventricular tachycardia) (HCC) 03/30/2014   VT (ventricular tachycardia) (HCC) 07/22/2014   Current Outpatient Medications  Medication Sig Dispense Refill   acetaminophen  (TYLENOL ) 500 MG tablet Take 1,000 mg by mouth  every 6 (six) hours as needed for moderate pain.     amiodarone  (PACERONE ) 200 MG tablet TAKE 1/2 TABLET ( 100 MG ) BY MOUTH DAILIY 45 tablet 3   carvedilol  (COREG ) 3.125 MG tablet TAKE ONE TABLET BY MOUTH TWICE DAILY (AM+BEDTIME) 60 tablet 3   cyclobenzaprine  (FLEXERIL ) 10 MG tablet Take 1 tablet (10 mg total) by mouth 3 (three) times daily as needed for muscle spasms. 20 tablet 0   dapagliflozin  propanediol (FARXIGA ) 10 MG TABS tablet TAKE 1 TABLET (10 MG TOTAL) BY MOUTH DAILY. 30 tablet 6   digoxin  (LANOXIN ) 0.125 MG tablet TAKE 1 TABLET BY MOUTH DAILY 90 tablet 3   doxylamine , Sleep, (UNISOM ) 25 MG tablet Take 25 mg by mouth at bedtime as needed for sleep.     ELIQUIS  5 MG TABS tablet TAKE ONE TABLET BY MOUTH TWICE DAILY (AM+BEDTIME) 60 tablet 6   ferrous sulfate  325 (65 FE) MG tablet Take 325 mg by mouth daily with breakfast.     losartan  (COZAAR ) 25 MG tablet TAKE ONE TABLET BY MOUTH TWICE DAILY  (AM+BEDTIME) 60 tablet 3   Multiple Vitamin (MULTIVITAMIN WITH MINERALS) TABS Take 1 tablet by mouth daily.     potassium chloride  SA (KLOR-CON  M) 20 MEQ tablet TAKE 4 TABS EVERY MORNING AND 4 TABS EVERY EVENING (2AM+2NOON+2PM+2BEDTIME) 240 tablet 11   spironolactone  (ALDACTONE ) 25 MG tablet TAKE 1 TABLET BY MOUTH DAILY. HOLD IF SYSTOLIC BLOOD PRESSURE LESS THAN 100 45 tablet 3   torsemide  (DEMADEX ) 20 MG tablet TAKE 4 TABLETS BY MOUTH IN THE MORNING AND 2 TABLETS EVERY EVENING (4AM+2NOON) 545 tablet 2   No current facility-administered medications for this encounter.   Wt Readings from Last 3 Encounters:  09/20/24 88.5 kg (195 lb)  09/08/24 86.2 kg (190 lb)  08/25/24 89.8 kg (198 lb)   BP 108/78   Pulse 79   Wt 88.5 kg (195 lb)   LMP  (LMP Unknown)   SpO2 97%   BMI 31.47 kg/m   PHYSICAL EXAM: General:  Well appearing. No resp difficulty HEENT: normal Neck: supple. no JVD. Carotids 2+ bilat; no bruits. No lymphadenopathy or thryomegaly appreciated. Cor: PMI nondisplaced. Regular rate & rhythm. No rubs, gallops or murmurs. Lungs: clear Abdomen: soft, nontender, nondistended. No hepatosplenomegaly. No bruits or masses. Good bowel sounds. Extremities: no cyanosis, clubbing, rash, edema Neuro: alert & orientedx3, cranial nerves grossly intact. mo  Device interrogation (personally reviewed): Fluid ok. Activity level 1.4hr/day No AF/VT Personally reviewed   ASSESSMENT & PLAN: 1. Chronic Systolic Heart Failure:  - Normal cors by cath 2013. Nonischemic cardiomyopathy s/p Medtronic ICD.  - Echo (6/16): EF 30-35%.   - Echo (6/19): EF 30-35%. - Echo (4/23): EF 25-30% - Echo 03/02/23 EF 20-25% RV mild HK Personally reviewed -> echo concerning for LVNC will need cMRI - ICD generator change 11/14/19 - CPX 4/24 FVC 1.67 (52%) FEV1 1.37 (54%) FEV1/FVC 82 (103%) BP rest: 88/68 ->  BP peak: 122/68 pVO2: 12.4 (55% predicted peak VO2) VE/VCO2 31 pRER: 1.12 VE/MVV:  79% - R/L cath 07/01/23 normal  cors RA 4 PA 46/20 (31) PCW 19 (v = 25-30) Fick 4.5/2.3 Thermo 3.9/2.0 PVR 2.6 WU - Echo 2/25 EF 25% + LVNC RV ok  - Stable NYHA II-III -  Volume ok Continue torsemide  80/40 + 80 KCL bid. - Continue losartan  25 mg bid.  - Continue spiro 25 mg daily. - Continue Coreg  3.125 mg bid (dose decreased due to intolerance) - Continue Farxiga  10 mg  daily. - Continue digoxin  0.125 daily.  - Continue Eliquis  in setting of LVNC and reduced EF  - Blood Type O - She is interested in advanced therapies (particularly transplant), but still a bit early. We discussed timing of advanced therapies in detail - Repeat CPX +/- RHC if symptoms worsen. - Labs today - f/u 6 months with echo                                                                                             -  2. SVT and VT  - s/p atrial tachycardia ablation in 01/2014.   - No VT on ICD interorgation tday - Drop amio 100daily to 100 mwf - Follows with Dr. Fernande - ACheck amio labs   3. SDOH - Has done very well with Paramedicine support.  - Compliance is good   I spent a total of 45 minutes today: 1) reviewing the patient's medical records including previous charts, labs and recent notes from other providers; 2) examining the patient and counseling them on their medical issues/explaining the plan of care; 3) adjusting meds as needed and 4) ordering lab work or other needed tests.   Toribio Fuel, MD 09/20/2024

## 2024-09-20 NOTE — Addendum Note (Signed)
 Encounter addended by: Marcelina Lisa HERO, RN on: 09/20/2024 10:37 AM  Actions taken: Order list changed, Diagnosis association updated, Clinical Note Signed, Charge Capture section accepted

## 2024-09-20 NOTE — Progress Notes (Signed)
 Paramedicine Encounter   Patient ID: Sydney Shaffer , female,   DOB: 1966-11-10,58 y.o.,  MRN: [id number]   Met patient in clinic today with provider.  Weight @ clinic-195 B/P-108/78 P-79 [id number]  Weight at last clinic appoint in April-187  Med changes-amio will be 100mg  every other day--mon/wed/fri   Pt reports she is doing ok.  She said she could get the [employer] medicaid per the social services dept but it is based on income.  But doesn't think it would be an issue with her being denied medicaid.  She makes $10 over the limit for food stamps just with her disability alone.  Weight staying same at home.   ICD-fluid looks good, no heart arrhythmias   Med changes as noted- will let pharmacy know.   She asked about heart transplant plan-- Per dr bensimhon-  Needs 2/3 things- Symptoms of can't do the ADL's--level 2/3 right now--needs 4 of the symptoms like sob at rest-she has borderline symptoms right now.  Needs RHC or exercise test to show numbers.  Right now we are holding pattern.  It was explained to her that as long as she can continue like she is then she is ok for now, when that day comes that she says she can't do it anymore she will let dr know and move from that.   with echo  Labs today   Izetta Quivers, EMT-Paramedic [phone] 09/20/2024

## 2024-09-21 LAB — T3, FREE: T3, Free: 3.7 pg/mL (ref 2.0–4.4)

## 2024-09-22 ENCOUNTER — Other Ambulatory Visit (HOSPITAL_COMMUNITY): Payer: Self-pay | Admitting: Internal Medicine

## 2024-09-23 ENCOUNTER — Ambulatory Visit

## 2024-09-23 DIAGNOSIS — I420 Dilated cardiomyopathy: Secondary | ICD-10-CM | POA: Diagnosis not present

## 2024-09-27 ENCOUNTER — Encounter

## 2024-09-27 LAB — CUP PACEART REMOTE DEVICE CHECK
Battery Remaining Longevity: 73 mo
Battery Voltage: 2.99 V
Brady Statistic AP VP Percent: 0 %
Brady Statistic AP VS Percent: 0.03 %
Brady Statistic AS VP Percent: 0 %
Brady Statistic AS VS Percent: 99.97 %
Brady Statistic RA Percent Paced: 0.03 %
Brady Statistic RV Percent Paced: 0 %
Date Time Interrogation Session: 20251019001605
HighPow Impedance: 81 Ohm
Implantable Lead Connection Status: 753985
Implantable Lead Connection Status: 753985
Implantable Lead Implant Date: 20130906
Implantable Lead Implant Date: 20130906
Implantable Lead Location: 753859
Implantable Lead Location: 753860
Implantable Lead Model: 181
Implantable Lead Model: 5076
Implantable Lead Serial Number: 322962
Implantable Pulse Generator Implant Date: 20201207
Lead Channel Impedance Value: 418 Ohm
Lead Channel Impedance Value: 418 Ohm
Lead Channel Impedance Value: 418 Ohm
Lead Channel Pacing Threshold Amplitude: 0.5 V
Lead Channel Pacing Threshold Amplitude: 1.625 V
Lead Channel Pacing Threshold Pulse Width: 0.4 ms
Lead Channel Pacing Threshold Pulse Width: 0.4 ms
Lead Channel Sensing Intrinsic Amplitude: 15.25 mV
Lead Channel Sensing Intrinsic Amplitude: 15.25 mV
Lead Channel Sensing Intrinsic Amplitude: 2.375 mV
Lead Channel Sensing Intrinsic Amplitude: 2.375 mV
Lead Channel Setting Pacing Amplitude: 1.5 V
Lead Channel Setting Pacing Amplitude: 2.5 V
Lead Channel Setting Pacing Pulse Width: 1 ms
Lead Channel Setting Sensing Sensitivity: 0.3 mV
Zone Setting Status: 755011

## 2024-09-29 NOTE — Addendum Note (Signed)
 Addended by: VICCI SELLER A on: 09/29/2024 01:56 PM   Modules accepted: Orders

## 2024-09-29 NOTE — Progress Notes (Signed)
 Remote ICD transmission.

## 2024-09-30 ENCOUNTER — Ambulatory Visit: Payer: Self-pay | Admitting: Cardiology

## 2024-10-06 ENCOUNTER — Telehealth (HOSPITAL_COMMUNITY): Payer: Self-pay

## 2024-10-06 NOTE — Telephone Encounter (Signed)
 Pt reached out to me to resch our appoint for today for next week.  She said there is a lot going on and she can't meet me today.  I asked if there was anything that we can help with and she denied any help.  Will f/u next week.   Izetta Quivers, EMT-Paramedic  [phone] 10/06/2024

## 2024-10-13 ENCOUNTER — Other Ambulatory Visit (HOSPITAL_COMMUNITY): Payer: Self-pay

## 2024-10-13 NOTE — Progress Notes (Addendum)
 Paramedicine Encounter    Patient ID: Sydney Shaffer, female    DOB: 1966-08-08, 58 y.o.   MRN: [id number]   Complaints-tired   Edema-none  Compliance with meds-yes  Pill box filled-n/a  If so, by whom-bubble packs  Refills needed-n/a     Pt had to resch with me from last week due to a lot of family issues going on. Her sister and BIL was in a bad accident and has been in the hosp, her daughter changes shifts which caused pt to have to get kids from school now and help out with that.  So she has been feeling overwhelmed with everything happening at one time.   She reports feeling ok, but she had a day to where she was feeling very tired and felt bloated and dizzy.  And had to take more breaks at work which was around the same time as when the family things was going on.  She contributes it to having to get up earlier due and no t getting a lot of rest.  But she feels better.  Pt reports her breathing is doing ok.  She has had palpitations every now and then-usually upon exertion.  No c/p.  She did make appoint with PCP.SABRA  She is on bubble packs and I checked them to make sure it matches her med list and its all accurate.    BP 118/70   Pulse 78   Resp 16   Wt 195 lb (88.5 kg)   LMP  (LMP Unknown)   SpO2 96%   BMI 31.47 kg/m  Weight yesterday-? Last visit weight--195 @ clinic   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   Anemia 03/29/2012    Current Outpatient Medications:    amiodarone  (PACERONE ) 200 MG tablet, Take 0.5 tablets (100 mg  total) by mouth 3 (three) times a week. Monday, Wednesday and Friday, Disp: 45 tablet, Rfl: 3   carvedilol  (COREG ) 3.125 MG tablet, TAKE ONE TABLET BY MOUTH TWICE DAILY (AM+BEDTIME), Disp: 60 tablet, Rfl: 3   dapagliflozin  propanediol (FARXIGA ) 10 MG TABS tablet, TAKE 1 TABLET (10 MG TOTAL) BY MOUTH DAILY., Disp: 30 tablet, Rfl: 6   digoxin  (LANOXIN ) 0.125 MG tablet, TAKE 1 TABLET BY MOUTH DAILY, Disp: 90 tablet, Rfl: 3   ELIQUIS  5 MG TABS tablet, TAKE ONE TABLET BY MOUTH TWICE DAILY (AM+BEDTIME), Disp: 60 tablet, Rfl: 6   losartan  (COZAAR ) 25 MG tablet, TAKE ONE TABLET BY MOUTH TWICE DAILY (AM+BEDTIME), Disp: 60 tablet, Rfl: 3   Multiple Vitamin (MULTIVITAMIN WITH MINERALS) TABS, Take 1 tablet by mouth daily., Disp: , Rfl:    potassium chloride  SA (KLOR-CON  M) 20 MEQ tablet, TAKE 4 TABS EVERY MORNING AND 4 TABS EVERY EVENING (2AM+2NOON+2PM+2BEDTIME), Disp: 240 tablet, Rfl: 11   spironolactone  (ALDACTONE ) 25 MG tablet, TAKE 1 TABLET BY MOUTH DAILY. HOLD IF SYSTOLIC BLOOD PRESSURE LESS THAN 100, Disp: 45 tablet, Rfl: 3   acetaminophen  (TYLENOL ) 500 MG tablet, Take 1,000 mg by mouth every 6 (six) hours as needed for moderate pain., Disp: , Rfl:    cyclobenzaprine  (FLEXERIL ) 10 MG tablet, Take 1 tablet (  10 mg total) by mouth 3 (three) times daily as needed for muscle spasms., Disp: 20 tablet, Rfl: 0   doxylamine , Sleep, (UNISOM ) 25 MG tablet, Take 25 mg by mouth at bedtime as needed for sleep., Disp: , Rfl:    ferrous sulfate  325 (65 FE) MG tablet, Take 325 mg by mouth daily with breakfast., Disp: , Rfl:    torsemide  (DEMADEX ) 20 MG tablet, TAKE 4 TABLETS BY MOUTH IN THE MORNING AND 2 TABLETS EVERY EVENING (4AM+2NOON), Disp: 545 tablet, Rfl: 2 No Known Allergies    Social History   Socioeconomic History   Marital status: Single    Spouse name: Not on file   Number of children: 3   Years of education: Not on file   Highest education level: Not on file  Occupational History   Occupation:  Database Administrator: [employer]   Occupation: office cleaning  Tobacco Use   Smoking status: Never   Smokeless tobacco: Never  Vaping Use   Vaping status: Never Used  Substance and Sexual Activity   Alcohol use: Yes    Comment: occasionally   Drug use: Never   Sexual activity: Not Currently  Other Topics Concern   Not on file  Social History Narrative   Not on file   Social Drivers of Health   Financial Resource Strain: High Risk (07/22/2024)   Overall Financial Resource Strain (CARDIA)    Difficulty of Paying Living Expenses: Hard  Food Insecurity: Food Insecurity Present (08/11/2024)   Hunger Vital Sign    Worried About Running Out of Food in the Last Year: Sometimes true    Ran Out of Food in the Last Year: Sometimes true  Transportation Needs: No Transportation Needs (07/02/2022)   PRAPARE - Administrator, Civil Service (Medical): No    Lack of Transportation (Non-Medical): No  Physical Activity: Not on file  Stress: Not on file  Social Connections: Not on file  Intimate Partner Violence: Not on file    Physical Exam      Future Appointments  Date Time Provider Department Center  11/21/2024  3:30 PM Celestia Rosaline SQUIBB, NP [facility] [place] [facility]  12/23/2024  7:00 AM CVD H[facility] DEVICE REMOTES [facility]  03/24/2025  7:00 AM CVD HVT DEVICE REMOTES [facility]  06/23/2025  7:00 AM CVD HVT DEVICE REMOTES [facility]       Izetta Quivers, Paramedic [phone] [facility] Paramedic  10/13/24

## 2024-11-10 ENCOUNTER — Other Ambulatory Visit (HOSPITAL_COMMUNITY): Payer: Self-pay

## 2024-11-10 NOTE — Progress Notes (Addendum)
 Paramedicine Encounter    Patient ID: Sydney Shaffer, female    DOB: 1966/10/09, 58 y.o.   MRN: [id number]   Complaints-denies   Edema-no   Compliance with meds-yes  Pill box filled-no  If so, by whom-n/a-bubble packs  Refills needed-n/a   Pt reports she is doing good. She's had some really good days but this week on mon/tues she had some dizziness. She had to lay down when this happened, but she woke up really tired.  Taking all her meds.  She reports her breathing is doing good.  Appetite good.  In the beginning of the week she felt bloated but cut back on fluids and she felt better. She did not check her weight to see if it was up.  She is no longer working-her last day was 12/1.   She is due for f/u with n April-she will need to call in feb to set that up.   Will f/u in 2 wks.      BP 112/68   Pulse 88   Resp 16   Wt 196 lb (88.9 kg)   LMP  (LMP Unknown)   SpO2 95%   BMI 31.64 kg/m  Weight yesterday-196  Last visit weight-195  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   Anemia 03/29/2012    Current Outpatient Medications:    acetaminophen  (TYLENOL ) 500 MG tablet, Take 1,000 mg by mouth every 6 (six) hours as needed for moderate pain., Disp: , Rfl:    amiodarone  (PACERONE ) 200 MG tablet, Take 0.5 tablets (100 mg total) by mouth 3 (three) times a week. Monday, Wednesday and Friday, Disp: 45 tablet, Rfl: 3   carvedilol  (COREG ) 3.125 MG tablet, TAKE ONE TABLET BY MOUTH TWICE DAILY (AM+BEDTIME), Disp: 60 tablet, Rfl: 3   dapagliflozin   propanediol (FARXIGA ) 10 MG TABS tablet, TAKE 1 TABLET (10 MG TOTAL) BY MOUTH DAILY., Disp: 30 tablet, Rfl: 6   digoxin  (LANOXIN ) 0.125 MG tablet, TAKE 1 TABLET BY MOUTH DAILY, Disp: 90 tablet, Rfl: 3   ELIQUIS  5 MG TABS tablet, TAKE ONE TABLET BY MOUTH TWICE DAILY (AM+BEDTIME), Disp: 60 tablet, Rfl: 6   ferrous sulfate  325 (65 FE) MG tablet, Take 325 mg by mouth daily with breakfast., Disp: , Rfl:    losartan  (COZAAR ) 25 MG tablet, TAKE ONE TABLET BY MOUTH TWICE DAILY (AM+BEDTIME), Disp: 60 tablet, Rfl: 3   Multiple Vitamin (MULTIVITAMIN WITH MINERALS) TABS, Take 1 tablet by mouth daily., Disp: , Rfl:    potassium chloride  SA (KLOR-CON  M) 20 MEQ tablet, TAKE 4 TABS EVERY MORNING AND 4 TABS EVERY EVENING (2AM+2NOON+2PM+2BEDTIME), Disp: 240 tablet, Rfl: 11   spironolactone  (ALDACTONE ) 25 MG tablet, TAKE 1 TABLET BY MOUTH DAILY. HOLD IF SYSTOLIC BLOOD PRESSURE LESS THAN 100, Disp: 45 tablet, Rfl: 3   torsemide  (DEMADEX ) 20 MG tablet, TAKE 4 TABLETS BY MOUTH IN THE MORNING AND 2 TABLETS EVERY EVENING (4AM+2NOON), Disp: 545 tablet, Rfl: 2   cyclobenzaprine  (FLEXERIL ) 10 MG tablet, Take 1 tablet (10 mg total) by mouth 3 (three) times daily as needed for muscle spasms., Disp: 20 tablet, Rfl: 0   doxylamine ,  Sleep, (UNISOM ) 25 MG tablet, Take 25 mg by mouth at bedtime as needed for sleep., Disp: , Rfl:  No Known Allergies    Social History   Socioeconomic History   Marital status: Single    Spouse name: Not on file   Number of children: 3   Years of education: Not on file   Highest education level: Not on file  Occupational History   Occupation: Database Administrator: [employer]   Occupation: office cleaning  Tobacco Use   Smoking status: Never   Smokeless tobacco: Never  Vaping Use   Vaping status: Never Used  Substance and Sexual Activity   Alcohol use: Yes    Comment: occasionally   Drug use: Never   Sexual activity: Not Currently  Other Topics Concern   Not on file  Social  History Narrative   Not on file   Social Drivers of Health   Financial Resource Strain: High Risk (07/22/2024)   Overall Financial Resource Strain (CARDIA)    Difficulty of Paying Living Expenses: Hard  Food Insecurity: Food Insecurity Present (08/11/2024)   Hunger Vital Sign    Worried About Running Out of Food in the Last Year: Sometimes true    Ran Out of Food in the Last Year: Sometimes true  Transportation Needs: No Transportation Needs (07/02/2022)   PRAPARE - Administrator, Civil Service (Medical): No    Lack of Transportation (Non-Medical): No  Physical Activity: Not on file  Stress: Not on file  Social Connections: Not on file  Intimate Partner Violence: Not on file    Physical Exam      Future Appointments  Date Time Provider Department Center  11/21/2024  3:30 PM Celestia Rosaline SQUIBB, NP [facility] [place]  12/23/2024  7:00 AM CVD HVT DEVICE REMOTES [facility]  03/24/2025  7:00 AM CVD HVT DEVICE REMOTES [facility]  06/23/2025  7:00 AM CVD HVT DEVICE REMOTES [facility]       Izetta Quivers, Paramedic [phone] [facility] Paramedic  11/10/24

## 2024-11-21 ENCOUNTER — Encounter (INDEPENDENT_AMBULATORY_CARE_PROVIDER_SITE_OTHER): Payer: Self-pay | Admitting: Primary Care

## 2024-11-21 ENCOUNTER — Ambulatory Visit (INDEPENDENT_AMBULATORY_CARE_PROVIDER_SITE_OTHER): Admitting: Primary Care

## 2024-11-21 VITALS — BP 121/80 | HR 83 | Temp 98.2°F | Resp 16 | Ht 74.0 in | Wt 195.8 lb

## 2024-11-21 DIAGNOSIS — J069 Acute upper respiratory infection, unspecified: Secondary | ICD-10-CM | POA: Diagnosis not present

## 2024-11-21 DIAGNOSIS — R062 Wheezing: Secondary | ICD-10-CM

## 2024-11-21 DIAGNOSIS — Z1211 Encounter for screening for malignant neoplasm of colon: Secondary | ICD-10-CM

## 2024-11-21 MED ORDER — FLUTICASONE PROPIONATE 50 MCG/ACT NA SUSP: 2.0000 | Freq: Every day | NASAL | 6 refills | Status: AC

## 2024-11-21 MED ORDER — ALBUTEROL SULFATE HFA 108 (90 BASE) MCG/ACT IN AERS: 2.0000 | INHALATION_SPRAY | Freq: Four times a day (QID) | RESPIRATORY_TRACT | 2 refills | Status: AC | PRN

## 2024-11-21 NOTE — Progress Notes (Unsigned)
 New Patient Office Visit  Subjective    Patient ID: Sydney Shaffer female  DOB: [birth year]  Age: 58 y.o. MRN: [id number]   CC:   HPI     Establish Care    Additional comments: Re-establish  Haven't seen provider in 2 years   Sore throat, cough, chest pain with cough, body aches, fever and chills         Last edited by Casimir Juvenal SAUNDERS, RMA on 11/21/2024  4:46 PM.      HPI   Medications Ordered Prior to Encounter[1]   Allergies[2]  Past Medical History:  Diagnosis Date   Acute on chronic systolic heart failure (HCC) 05/05/2012   Anemia    felt to be due to heavy menstrual flow   Atrial tachycardia    s/p ablation   AVNRT (AV nodal re-entry tachycardia)    s/p ablation   CHF (congestive heart failure) (HCC)    Dx 03/2012 - dilated cardiomyopathy with EF 20-25% by echo (abnl nuc but normal coronaries 04/02/12 per cath.    Chronic systolic heart failure (HCC)    Dysrhythmia    Bradycardia   Hypertension    Hypertensive heart disease with CHF (HCC)    ICD (implantable cardiac defibrillator) in place 08/13/2012   Menorrhagia    Metrorrhagia 06/04/2015   SVT (supraventricular tachycardia) (HCC) 03/30/2014   VT (ventricular tachycardia) (HCC) 07/22/2014     Past Surgical History:  Procedure Laterality Date   APPENDECTOMY     CARDIAC CATHETERIZATION  April 2013   normal coronaries   CARDIAC CATHETERIZATION [name] 09/12/2016   Procedure: Right/Left Heart Cath and Coronary Angiography;  Surgeon: Toribio SAUNDERS Fuel, MD;  Location: [facility] INVASIVE CV LAB;  Service: Cardiovascular;  Laterality: [name];   DILITATION & CURRETTAGE/HYSTROSCOPY WITH VERSAPOINT RESECTION [name] 10/05/2015   Procedure: DILATATION & CURETTAGE/HYSTEROSCOPY WITH VERSAPOINT RESECTION;  Surgeon: Percilla Burly, MD;  Location: [facility];  Service: Gynecology;  Laterality: [name];   EP study and ablation  01/07/13   Ablation of AVNRT and atrial tachycardia (arising from the anteroseptal RA 12mm above the HIS)    ICD  08/13/2012   ICD GENERATOR CHANGEOUT [name] 11/14/2019   Procedure: ICD GENERATOR CHANGEOUT;  Surgeon: Fernande Elspeth BROCKS, MD;  Location: [facility] INVASIVE CV LAB;  Service: Cardiovascular;  Laterality: [name];   IMPLANTABLE CARDIOVERTER DEFIBRILLATOR IMPLANT [name] 08/13/2012   Procedure: IMPLANTABLE CARDIOVERTER DEFIBRILLATOR IMPLANT;  Surgeon: Elspeth BROCKS Fernande, MD;  Location: [facility] CATH LAB;  Service: Cardiovascular;  Laterality: [name];   LEFT HEART CATHETERIZATION WITH CORONARY ANGIOGRAM [name] 04/02/2012   Procedure: LEFT HEART CATHETERIZATION WITH CORONARY ANGIOGRAM;  Surgeon: Peter M Jordan, MD;  Location: [facility] CATH LAB;  Service: Cardiovascular;  Laterality: [name];   RIGHT/LEFT HEART CATH AND CORONARY ANGIOGRAPHY [name] 07/01/2023   Procedure: RIGHT/LEFT HEART CATH AND CORONARY ANGIOGRAPHY;  Surgeon: Fuel Toribio SAUNDERS, MD;  Location: [facility] INVASIVE CV LAB;  Service: Cardiovascular;  Laterality: [name];   SUPRAVENTRICULAR TACHYCARDIA ABLATION [name] 01/07/2013   Procedure: SUPRAVENTRICULAR TACHYCARDIA ABLATION;  Surgeon: Lynwood Rakers, MD;  Location: [facility] CATH LAB;  Service: Cardiovascular;  Laterality: [name];   TUBAL LIGATION       Family History  Problem Relation Age of Onset   Breast cancer Mother 19   Cancer Father     Social History   Socioeconomic History   Marital status: Single    Spouse name: Not on file   Number of children: 3   Years of education: Not on file   Highest education level: Not  on file  Occupational History   Occupation: Database Administrator: [employer]   Occupation: office cleaning  Tobacco Use   Smoking status: Never   Smokeless tobacco: Never  Vaping Use   Vaping status: Never Used  Substance and Sexual Activity   Alcohol use: Yes    Comment: occasionally   Drug use: Never   Sexual activity: Not Currently  Other Topics Concern   Not on file  Social History Narrative   Not on file   Social Drivers of Health   Tobacco Use: Low Risk (11/21/2024)   Patient History    Smoking  Tobacco Use: Never    Smokeless Tobacco Use: Never    Passive Exposure: Not on file  Financial Resource Strain: High Risk (07/22/2024)   Overall Financial Resource Strain (CARDIA)    Difficulty of Paying Living Expenses: Hard  Food Insecurity: Food Insecurity Present (08/11/2024)   Epic    Worried About Programme Researcher, Broadcasting/film/video in the Last Year: Sometimes true    Ran Out of Food in the Last Year: Sometimes true  Transportation Needs: No Transportation Needs (07/02/2022)   PRAPARE - Administrator, Civil Service (Medical): No    Lack of Transportation (Non-Medical): No  Physical Activity: Not on file  Stress: Not on file  Social Connections: Not on file  Intimate Partner Violence: Not on file  Depression (EYV7-0): Low Risk (11/21/2024)   Depression (PHQ2-9)    PHQ-2 Score: 0  Alcohol Screen: Not on file  Housing: High Risk (01/12/2024)   Housing Stability Vital Sign    Unable to Pay for Housing in the Last Year: Yes    Number of Times Moved in the Last Year: Not on file    Homeless in the Last Year: No  Utilities: At Risk (01/28/2024)   AHC Utilities    Threatened with loss of utilities: Yes  Health Literacy: Not on file       Health Maintenance  Topic Date Due   DTaP/Tdap/Td vaccine (1 - Tdap) Never done   Hepatitis B Vaccine (1 of 3 - 19+ 3-dose series) Never done   Pap with HPV screening  Never done   Colon Cancer Screening  Never done   Pneumococcal Vaccine for age over 51 (2 of 2 - PCV) 08/14/2013   Flu Shot  07/08/2024   COVID-19 Vaccine (4 - 2025-26 season) 08/08/2024   Breast Cancer Screening  06/22/2025   Hepatitis C Screening  Completed   HIV Screening  Completed   Zoster (Shingles) Vaccine  Completed   HPV Vaccine  Aged Out   Meningitis B Vaccine  Aged Out    Objective    BP 121/80   Pulse 83   Temp 98.2 F (36.8 C)   Resp 16   Ht 6' 2 (1.88 m)   Wt 195 lb 12.8 oz (88.8 kg)   LMP  (LMP Unknown)   SpO2 100%   BMI 25.14 kg/m  {Vitals History  (Optional):23777}   Physical Exam Constitutional:      Appearance: She is obese.  HENT:     Head: Normocephalic.     Right Ear: Tympanic membrane, ear canal and external ear normal.     Left Ear: Tympanic membrane, ear canal and external ear normal.     Nose: Congestion present.     Comments: Left > RT swollen red boggy  Eyes:     Extraocular Movements: Extraocular movements intact.     Pupils: Pupils  are equal, round, and reactive to light.  Cardiovascular:     Rate and Rhythm: Normal rate and regular rhythm.  Pulmonary:     Breath sounds: Wheezing and rhonchi present.     Comments: Adventitious all quadrants   Abdominal:     General: Abdomen is flat.     Palpations: Abdomen is soft.  Musculoskeletal:        General: Normal range of motion.     Cervical back: Normal range of motion and neck supple.  Skin:    General: Skin is warm and dry.  Neurological:     Mental Status: She is alert and oriented to person, place, and time.  Psychiatric:        Mood and Affect: Mood normal.        Behavior: Behavior normal.        Thought Content: Thought content normal.     {Labs (optional):23779}   Assessment & Plan:   There are no diagnoses linked to this encounter. There are no diagnoses linked to this encounter.   Follow-up:  No follow-ups on file.  The above assessment and management plan was discussed with the patient. The patient verbalized understanding of and has agreed to the management plan. Patient is aware to call the clinic if symptoms fail to improve or worsen. Patient is aware when to return to the clinic for a follow-up visit. Patient educated on when it is appropriate to go to the emergency department.   Sydney Bohr, NP-C    [1]  Current Outpatient Medications on File Prior to Visit  Medication Sig Dispense Refill   acetaminophen  (TYLENOL ) 500 MG tablet Take 1,000 mg by mouth every 6 (six) hours as needed for moderate pain.     amiodarone  (PACERONE )  200 MG tablet Take 0.5 tablets (100 mg total) by mouth 3 (three) times a week. Monday, Wednesday and Friday 45 tablet 3   carvedilol  (COREG ) 3.125 MG tablet TAKE ONE TABLET BY MOUTH TWICE DAILY (AM+BEDTIME) 60 tablet 3   cyclobenzaprine  (FLEXERIL ) 10 MG tablet Take 1 tablet (10 mg total) by mouth 3 (three) times daily as needed for muscle spasms. 20 tablet 0   dapagliflozin  propanediol (FARXIGA ) 10 MG TABS tablet TAKE 1 TABLET (10 MG TOTAL) BY MOUTH DAILY. 30 tablet 6   digoxin  (LANOXIN ) 0.125 MG tablet TAKE 1 TABLET BY MOUTH DAILY 90 tablet 3   doxylamine , Sleep, (UNISOM ) 25 MG tablet Take 25 mg by mouth at bedtime as needed for sleep.     ELIQUIS  5 MG TABS tablet TAKE ONE TABLET BY MOUTH TWICE DAILY (AM+BEDTIME) 60 tablet 6   ferrous sulfate  325 (65 FE) MG tablet Take 325 mg by mouth daily with breakfast.     losartan  (COZAAR ) 25 MG tablet TAKE ONE TABLET BY MOUTH TWICE DAILY (AM+BEDTIME) 60 tablet 3   Multiple Vitamin (MULTIVITAMIN WITH MINERALS) TABS Take 1 tablet by mouth daily.     potassium chloride  SA (KLOR-CON  M) 20 MEQ tablet TAKE 4 TABS EVERY MORNING AND 4 TABS EVERY EVENING (2AM+2NOON+2PM+2BEDTIME) 240 tablet 11   spironolactone  (ALDACTONE ) 25 MG tablet TAKE 1 TABLET BY MOUTH DAILY. HOLD IF SYSTOLIC BLOOD PRESSURE LESS THAN 100 45 tablet 3   torsemide  (DEMADEX ) 20 MG tablet TAKE 4 TABLETS BY MOUTH IN THE MORNING AND 2 TABLETS EVERY EVENING (4AM+2NOON) 545 tablet 2   No current facility-administered medications on file prior to visit.  [2] No Known Allergies

## 2024-11-22 ENCOUNTER — Ambulatory Visit (HOSPITAL_COMMUNITY)
Admission: RE | Admit: 2024-11-22 | Discharge: 2024-11-22 | Disposition: A | Source: Ambulatory Visit | Attending: Primary Care | Admitting: Primary Care

## 2024-11-22 ENCOUNTER — Ambulatory Visit: Payer: Self-pay

## 2024-11-22 ENCOUNTER — Other Ambulatory Visit (HOSPITAL_COMMUNITY): Payer: Self-pay | Admitting: Family Medicine

## 2024-11-22 DIAGNOSIS — J069 Acute upper respiratory infection, unspecified: Secondary | ICD-10-CM

## 2024-11-22 LAB — POC SOFIA 2 FLU + SARS ANTIGEN FIA
Influenza A, POC: NEGATIVE
Influenza B, POC: NEGATIVE
SARS Coronavirus 2 Ag: NEGATIVE

## 2024-11-22 NOTE — Telephone Encounter (Signed)
 noted

## 2024-11-22 NOTE — Telephone Encounter (Signed)
 FYI Only or Action Required?: Action required by provider: clinical question for provider. Pt just seen by PCP yesterday for current URI symptoms, worse today. Pt wanting to know CXR results when they are available and next steps. Wanting to know if additional medication will be prescribed. Preferred pharmacy below:   [facility] - [place], [place] - [address]   Patient was last seen in primary care on 11/21/2024 by Celestia Rosaline SQUIBB, NP.  Called Nurse Triage reporting Cough, Chest Pain, and Sore Throat.  Symptoms began several days ago.  Interventions attempted: OTC medications: Tylenol , nyquil.  Symptoms are: gradually worsening.  Triage Disposition: Call PCP When Office is Open  Patient/caregiver understands and will follow disposition?: Yes  Pt was seen in office yesterday by PCP for URI symptoms x3 days. Sore throat, cough, chest pain with cough, body aches, fever and chills at that time. Plan was to get CXR first and then depending on results would possibly prescribed medication. Prescribed an inhaler which will be delivered by tomorrow at the latest. CXR done today, results not yet available. Symptoms have gotten moderately worse, minus fever and chills which have resolved after taking tylenol . 8/10 pain in center of chest only when coughing for past 3 days. Does not radiate. Some SOB with exertion. Coughing up yellow green sputum, denies blood. Pt would like to know what CXR results are and next steps if provider would like to prescribe something depending on CXR results. Sending request to clinic. Advised UC or ED for worsening symptoms in the meantime.   Copied from CRM #[id number]. Topic: Clinical - Red Word Triage >> Nov 22, 2024  4:14 PM Rea ORN wrote: Red Word that prompted transfer to Nurse Triage: worsening sore throat Reason for Disposition  [1] Chest pain(s) lasting a few seconds from coughing AND [2] persists > 3 days  Answer Assessment - Initial  Assessment Questions 1. LOCATION: Where does it hurt?       Center of chest  2. RADIATION: Does the pain go anywhere else? (e.g., into neck, jaw, arms, back)     Stays in center of chest. A small amount of pain in middle part of back occasionally when coughing.  3. ONSET: When did the chest pain begin? (Minutes, hours or days)      3 days  4. PATTERN: Does the pain come and go, or has it been constant since it started?  Does it get worse with exertion?      Occurs with coughing  5. DURATION: How long does it last (e.g., seconds, minutes, hours)     Only when coughing  6. SEVERITY: How bad is the pain?  (e.g., Scale 1-10; mild, moderate, or severe)     8/10  7. CARDIAC RISK FACTORS: Do you have any history of heart problems or risk factors for heart disease? (e.g., angina, prior heart attack; diabetes, high blood pressure, high cholesterol, smoker, or strong family history of heart disease)     CHF, atrial tachycardia, htn  8. PULMONARY RISK FACTORS: Do you have any history of lung disease?  (e.g., blood clots in lung, asthma, emphysema, birth control pills)     Denies  9. CAUSE: What do you think is causing the chest pain?     Cough  10. OTHER SYMPTOMS: Do you have any other symptoms? (e.g., dizziness, nausea, vomiting, sweating, fever, difficulty breathing, cough)       Cough with yellow green mucus, no blood. Body aches. Some SOB  when getting up walking around. Denies fever  Protocols used: Chest Pain-A-AH

## 2024-11-23 ENCOUNTER — Telehealth (HOSPITAL_COMMUNITY): Payer: Self-pay

## 2024-11-23 ENCOUNTER — Other Ambulatory Visit (HOSPITAL_COMMUNITY): Payer: Self-pay

## 2024-11-23 NOTE — Telephone Encounter (Signed)
 Will forward to provider

## 2024-11-24 ENCOUNTER — Other Ambulatory Visit (HOSPITAL_COMMUNITY): Payer: Self-pay

## 2024-11-24 ENCOUNTER — Encounter (HOSPITAL_COMMUNITY): Payer: Self-pay

## 2024-11-24 NOTE — Progress Notes (Signed)
 HF Paramedicine Team Based Care Meeting    [facility] HF Paramedicine  Sydney Shaffer Sydney Shaffer Sydney Shaffer [facility] admit within the last 30 days for heart failure?  NONE  Medications concerns? Bubble packs  Transportation issues ?  Uses daughter vehicle   Education needs? None   SDOH- trying to get her car fixed for own txp  Eligible for discharge?   Not at this time, monitoring for increased symptoms, per provider to continue to monitor.  She may potentially need LVAD or transplant at some point in the future per providers.    Sydney Shaffer, EMT-Paramedic  [phone] 11/24/2024

## 2024-11-24 NOTE — Progress Notes (Signed)
 Paramedicine Encounter    Patient ID: Sydney Shaffer, female    DOB: Jun 11, 1966, 58 y.o.   MRN: [id number]   Complaints-nasal congestion/cough   Edema-none   Compliance with meds-yes  Pill box filled-n/a  If so, by whom-bubble packs   Refills needed-none  Pt was seen in pcp for flu like symptoms. She had chest xray done on Tuesday but nobody has reviewed that yet. She has reached out but no response. She is not happy with the care and lack of response of her results.  She denies increased sob right now, still has a cough, with productive mucous, she denies c/p, no dizziness, no bloating. Appetite good. Fluid intake ok.  No vomiting/no diarrhea.  No more fevers.  Taking meds ok.  She was prescribed an inhaler and nasal spray, inhaler helped a lot. Not so much usage on the nasal spray.  Has a slight wheeze to the left upper side. She does have inhaler but doesn't want to use it only as needed since it makes her HR increase.  Will f/u in a few wks.     BP 126/78   Pulse 86   Resp 16   LMP  (LMP Unknown)   SpO2 94%  Weight yesterday-? 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    Occupation: office cleaning  Tobacco Use   Smoking status: Never   Smokeless tobacco: Never  Vaping Use   Vaping status: Never Used  Substance and Sexual Activity   Alcohol use: Yes    Comment: occasionally   Drug use: Never   Sexual activity: Not Currently  Other Topics Concern   Not on file  Social History Narrative   Not on file   Social Drivers of Health   Tobacco Use: Low Risk (11/21/2024)   Patient History    Smoking Tobacco Use: Never    Smokeless Tobacco Use: Never    Passive Exposure: Not on file  Financial Resource Strain: High Risk (07/22/2024)   Overall Financial Resource Strain (CARDIA)    Difficulty of Paying Living Expenses: Hard  Food Insecurity: Food Insecurity Present (08/11/2024)   Epic    Worried About Programme Researcher, Broadcasting/film/video in the Last Year: Sometimes true    Ran Out of Food in the Last Year: Sometimes true  Transportation Needs: No Transportation Needs (07/02/2022)   PRAPARE - Administrator, Civil Service (Medical): No    Lack of Transportation (Non-Medical): No  Physical Activity: Not on file  Stress: Not on file  Social Connections: Not on file  Intimate Partner Violence: Not on file  Depression (PHQ2-9): Low Risk (11/21/2024)   Depression (PHQ2-9)    PHQ-2 Score: 0  Alcohol Screen: Not on file  Housing: High Risk (01/12/2024)   Housing Stability Vital Sign    Unable to Pay for Housing in the Last Year: Yes    Number of Times Moved in the Last Year: Not on file    Homeless in the Last Year: No  Utilities: At Risk (01/28/2024)   AHC Utilities    Threatened with loss of utilities: Yes  Health Literacy: Not on file    Physical Exam      Future Appointments  Date Time Provider Department Center  12/23/2024  7:00 AM CVD HVT DEVICE REMOTES [facility]  03/24/2025  7:00 AM CVD HVT DEVICE REMOTES [facility]  06/23/2025  7:00 AM CVD HVT DEVICE REMOTES [facility]       Izetta Quivers, Paramedic [phone] [facility]  Paramedic  11/24/2024     [1]  Current Outpatient Medications:    acetaminophen  (TYLENOL ) 500 MG tablet, Take 1,000 mg by mouth every 6 (six) hours as needed for moderate pain., Disp: , Rfl:    albuterol  (VENTOLIN  HFA) 108 (90 Base) MCG/ACT inhaler, Inhale 2 puffs into the lungs every 6 (six) hours as needed for wheezing or shortness of breath., Disp: 18 each, Rfl: 2   amiodarone  (PACERONE ) 200 MG tablet, Take 0.5 tablets (100 mg total) by mouth 3 (three) times a week. Monday, Wednesday and Friday, Disp: 45 tablet, Rfl: 3   carvedilol  (COREG ) 3.125 MG tablet, TAKE ONE TABLET BY MOUTH TWICE DAILY (AM+BEDTIME), Disp: 60 tablet, Rfl: 3   cyclobenzaprine  (FLEXERIL ) 10 MG tablet, Take 1 tablet (10 mg total) by mouth 3 (three) times daily as needed for muscle spasms., Disp: 20 tablet, Rfl: 0   dapagliflozin  propanediol (FARXIGA ) 10 MG TABS tablet, TAKE 1 TABLET (10 MG TOTAL) BY MOUTH DAILY., Disp: 30 tablet, Rfl: 6   digoxin  (LANOXIN ) 0.125 MG tablet, TAKE 1 TABLET BY MOUTH DAILY, Disp: 90 tablet, Rfl: 3   doxylamine , Sleep, (UNISOM ) 25 MG tablet, Take 25 mg by mouth at bedtime as needed for sleep., Disp: , Rfl:    ELIQUIS  5 MG TABS tablet, TAKE ONE TABLET BY MOUTH TWICE DAILY (AM+BEDTIME), Disp: 60 tablet, Rfl: 6   ferrous sulfate  325 (65 FE) MG tablet, Take 325 mg by mouth daily with breakfast., Disp: , Rfl:    fluticasone  (FLONASE ) 50 MCG/ACT nasal spray, Place 2 sprays into both nostrils daily., Disp: 16 g, Rfl: 6   losartan  (COZAAR ) 25 MG tablet, TAKE ONE TABLET BY MOUTH TWICE DAILY (AM+BEDTIME), Disp: 60 tablet, Rfl: 3   Multiple Vitamin (MULTIVITAMIN WITH MINERALS) TABS, Take 1 tablet by mouth daily., Disp: , Rfl:    potassium chloride  SA (KLOR-CON  M) 20 MEQ tablet, TAKE 4 TABS EVERY MORNING AND 4 TABS EVERY EVENING (2AM+2NOON+2PM+2BEDTIME), Disp: 240 tablet, Rfl: 11   spironolactone  (ALDACTONE ) 25 MG tablet, TAKE 1 TABLET BY MOUTH DAILY **HOLD IF SYSTOLIC BLOOD PRESSURE LESS THAN 100  (BEDTIME), Disp: 45 tablet, Rfl: 3   torsemide  (DEMADEX ) 20 MG tablet, TAKE 4 TABLETS BY MOUTH IN THE MORNING AND 2 TABLETS EVERY EVENING (4AM+2NOON), Disp: 545 tablet, Rfl: 2 [2] No Known Allergies

## 2024-11-25 ENCOUNTER — Other Ambulatory Visit (HOSPITAL_COMMUNITY): Payer: Self-pay

## 2024-11-25 NOTE — Telephone Encounter (Signed)
 Advanced Heart Failure Patient Advocate Encounter  Prior authorization for Farxiga  has been submitted and approved. Test billing returns $4 for 90 day supply.  Key: [id number] Effective: 11/25/2024 to 11/25/2025  Rachel DEL, CPhT Rx Patient Advocate Phone: [phone]

## 2024-11-26 ENCOUNTER — Ambulatory Visit (HOSPITAL_COMMUNITY)
Admission: EM | Admit: 2024-11-26 | Discharge: 2024-11-26 | Disposition: A | Discharge status: Home / Self Care | Attending: Emergency Medicine | Admitting: Emergency Medicine

## 2024-11-26 ENCOUNTER — Ambulatory Visit (HOSPITAL_COMMUNITY)

## 2024-11-26 ENCOUNTER — Encounter (HOSPITAL_COMMUNITY): Payer: Self-pay | Admitting: Emergency Medicine

## 2024-11-26 DIAGNOSIS — J209 Acute bronchitis, unspecified: Secondary | ICD-10-CM

## 2024-11-26 DIAGNOSIS — R051 Acute cough: Secondary | ICD-10-CM | POA: Diagnosis not present

## 2024-11-26 MED ORDER — PROMETHAZINE-DM 6.25-15 MG/5ML PO SYRP: 5.0000 mL | ORAL_SOLUTION | Freq: Every evening | ORAL | 0 refills | Status: AC | PRN

## 2024-11-26 MED ORDER — PREDNISONE 20 MG PO TABS: 40.0000 mg | ORAL_TABLET | Freq: Every day | ORAL | 0 refills | Status: AC

## 2024-11-26 MED ORDER — IPRATROPIUM-ALBUTEROL 0.5-2.5 (3) MG/3ML IN SOLN
RESPIRATORY_TRACT | Status: AC
  Filled 2024-11-26: qty 3

## 2024-11-26 MED ORDER — IPRATROPIUM-ALBUTEROL 0.5-2.5 (3) MG/3ML IN SOLN
3.0000 mL | Freq: Once | RESPIRATORY_TRACT | Status: AC
  Administered 2024-11-26: 3 mL via RESPIRATORY_TRACT

## 2024-11-26 MED ORDER — BENZONATATE 100 MG PO CAPS: 100.0000 mg | ORAL_CAPSULE | Freq: Three times a day (TID) | ORAL | 0 refills | Status: AC

## 2024-11-26 NOTE — Discharge Instructions (Signed)
 Your x-ray was negative for any underlying pneumonia.  I believe your symptoms are likely related to a bronchitis. Start taking 2 tablets of prednisone  once daily to help with wheezing and inflammation in the lungs. Take Tessalon  every 8 hours as needed for cough. Take Promethazine  DM cough syrup at bedtime as needed for cough.  This can make you drowsy so do not drive, work, or drink alcohol while taking this. You can use the albuterol  inhaler every 4-6 hours as needed for shortness of breath and wheezing. Follow-up with your primary care provider or return here as needed.

## 2024-11-26 NOTE — ED Provider Notes (Signed)
 " [facility]    CSN: [id number] Arrival date & time: 11/26/24  1442      History   Chief Complaint Chief Complaint  Patient presents with   Cough   Generalized Body Aches    HPI Sydney Shaffer is a 58 y.o. female.   Patient presents with cough, nasal congestion, body aches, chest congestion, shortness of breath, and right-sided chest pain with coughing that has progressively worsened over the last week.  Patient did see her PCP on 12/15 and was prescribed an inhaler and nasal spray at that time.  Patient states that she has been using these and minimal relief.  Patient reports that she did receive a chest x-ray that day as well but is still not received any results.  Denies history of asthma or COPD.  I independently reviewed previously performed his chest x-ray and did not visualize any active cardiopulmonary disease to suggest pneumonia.  The history is provided by the patient and medical records.  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    Chronic systolic heart failure (HCC)    Dysrhythmia    Bradycardia   Hypertension    Hypertensive heart disease with CHF (HCC)    ICD (implantable cardiac defibrillator) in place 08/13/2012   Menorrhagia    Metrorrhagia 06/04/2015   SVT (supraventricular tachycardia) (HCC) 03/30/2014   VT (ventricular tachycardia) (HCC) 07/22/2014    Patient Active Problem List   Diagnosis Date Noted   Dilated cardiomyopathy (HCC) 06/04/2015   Hypokalemia    HTN (hypertension) 03/30/2014   Chest pressure- unspecified 02/03/2014   Atrial tachycardia 10/01/2012   Implantable  defibrillator-Medtronic 08/16/2012   Hypertensive heart disease with CHF (congestive heart failure) (HCC) 08/04/2012   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   Anemia 03/29/2012    Past Surgical History:  Procedure Laterality Date   APPENDECTOMY     CARDIAC CATHETERIZATION  April 2013   normal coronaries   CARDIAC CATHETERIZATION [name] 09/12/2016   Procedure: Right/Left Heart Cath and Coronary Angiography;  Surgeon: Toribio JONELLE Fuel, MD;  Location: [facility] INVASIVE CV LAB;  Service: Cardiovascular;  Laterality: [name];   DILITATION & CURRETTAGE/HYSTROSCOPY WITH VERSAPOINT RESECTION [name] 10/05/2015   Procedure: DILATATION & CURETTAGE/HYSTEROSCOPY WITH VERSAPOINT RESECTION;  Surgeon: Percilla Burly, MD;  Location: [facility];  Service: Gynecology;  Laterality: [name];   EP study and ablation  01/07/13   Ablation of AVNRT and atrial tachycardia (arising from the anteroseptal RA 12mm above the HIS)   ICD  08/13/2012   ICD GENERATOR CHANGEOUT [name] 11/14/2019   Procedure: ICD GENERATOR CHANGEOUT;  Surgeon: Fernande Elspeth BROCKS, MD;  Location: [facility] INVASIVE CV LAB;  Service: Cardiovascular;  Laterality: [name];   IMPLANTABLE CARDIOVERTER DEFIBRILLATOR IMPLANT [name] 08/13/2012   Procedure: IMPLANTABLE CARDIOVERTER DEFIBRILLATOR IMPLANT;  Surgeon: Elspeth BROCKS Fernande, MD;  Location: [facility] CATH LAB;  Service: Cardiovascular;  Laterality: [name];   LEFT HEART CATHETERIZATION WITH CORONARY ANGIOGRAM [name] 04/02/2012   Procedure: LEFT HEART CATHETERIZATION WITH CORONARY ANGIOGRAM;  Surgeon: Peter M Jordan, MD;  Location: [facility] CATH LAB;  Service: Cardiovascular;  Laterality: [name];   RIGHT/LEFT HEART CATH AND CORONARY ANGIOGRAPHY [name] 07/01/2023   Procedure: RIGHT/LEFT HEART CATH AND CORONARY ANGIOGRAPHY;  Surgeon: Cherrie Toribio SAUNDERS, MD;  Location: [facility] INVASIVE CV LAB;  Service: Cardiovascular;  Laterality: [name];   SUPRAVENTRICULAR TACHYCARDIA ABLATION [name] 01/07/2013   Procedure: SUPRAVENTRICULAR TACHYCARDIA ABLATION;  Surgeon: Lynwood Rakers,  MD;  Location: [facility] CATH LAB;  Service: Cardiovascular;  Laterality: [name];   TUBAL LIGATION      OB History   No obstetric history on file.      Home Medications    Prior to Admission medications  Medication Sig Start Date End Date Taking? Authorizing Provider  benzonatate  (TESSALON ) 100 MG capsule Take 1 capsule (100 mg total) by mouth every 8 (eight) hours. 11/26/24  Yes Johnie, Eutha Cude A, NP  predniSONE  (DELTASONE ) 20 MG tablet Take 2 tablets (40 mg total) by mouth daily for 5 days. 11/26/24 12/01/24 Yes Johnie Flaming A, NP  promethazine -dextromethorphan (PROMETHAZINE -DM) 6.25-15 MG/5ML syrup Take 5 mLs by mouth at bedtime as needed for cough. 11/26/24  Yes Johnie, Zaide Mcclenahan A, NP  acetaminophen  (TYLENOL ) 500 MG tablet Take 1,000 mg by mouth every 6 (six) hours as needed for moderate pain.    [provider]  albuterol  (VENTOLIN  HFA) 108 (90 Base) MCG/ACT inhaler Inhale 2 puffs into the lungs every 6 (six) hours as needed for wheezing or shortness of breath. 11/21/24   Celestia Rosaline SQUIBB, NP  amiodarone  (PACERONE ) 200 MG tablet Take 0.5 tablets (100 mg total) by mouth 3 (three) times a week. Monday, Wednesday and Friday 09/21/24   Bensimhon, Toribio SAUNDERS, MD  carvedilol  (COREG ) 3.125 MG tablet TAKE ONE TABLET BY MOUTH TWICE DAILY (AM+BEDTIME) 09/23/24   Bensimhon, Toribio SAUNDERS, MD  cyclobenzaprine  (FLEXERIL ) 10 MG tablet Take 1 tablet (10 mg total) by mouth 3 (three) times daily as needed for muscle spasms. 05/14/23   Georgina Speaks, FNP  dapagliflozin  propanediol (FARXIGA ) 10 MG TABS tablet TAKE 1 TABLET (10 MG TOTAL) BY MOUTH DAILY. 08/03/24   Glena Harlene HERO, FNP  digoxin  (LANOXIN ) 0.125 MG tablet TAKE 1 TABLET BY MOUTH DAILY 03/08/24   Bensimhon, Toribio SAUNDERS, MD  doxylamine , Sleep, (UNISOM ) 25 MG tablet Take 25 mg by mouth at bedtime as needed for sleep. 04/16/21   [provider]  ELIQUIS  5 MG TABS tablet TAKE ONE TABLET BY MOUTH TWICE DAILY (AM+BEDTIME) 03/08/24   Bensimhon,  Toribio SAUNDERS, MD  ferrous sulfate  325 (65 FE) MG tablet Take 325 mg by mouth daily with breakfast.    [provider]  fluticasone  (FLONASE ) 50 MCG/ACT nasal spray Place 2 sprays into both nostrils daily. 11/21/24   Celestia Rosaline SQUIBB, NP  losartan  (COZAAR ) 25 MG tablet TAKE ONE TABLET BY MOUTH TWICE DAILY (AM+BEDTIME) 08/03/24   Bensimhon, Daniel R, MD  Multiple Vitamin (MULTIVITAMIN WITH MINERALS) TABS Take 1 tablet by mouth daily.    [provider]  potassium chloride  SA (KLOR-CON  M) 20 MEQ tablet TAKE 4 TABS EVERY MORNING AND 4 TABS EVERY EVENING (2AM+2NOON+2PM+2BEDTIME) 08/03/24   Bensimhon, Toribio SAUNDERS, MD  spironolactone  (ALDACTONE ) 25 MG tablet TAKE 1 TABLET BY MOUTH DAILY **HOLD IF SYSTOLIC BLOOD PRESSURE LESS THAN 100 (BEDTIME) 11/23/24   [place], Harlene HERO, FNP  torsemide  (DEMADEX ) 20 MG tablet TAKE 4 TABLETS BY MOUTH IN THE MORNING AND 2 TABLETS EVERY EVENING (4AM+2NOON) 08/03/24   Bensimhon, Toribio SAUNDERS, MD    Family History Family History  Problem Relation Age of Onset   Breast cancer Mother 14  Cancer Father     Social History Social History[1]   Allergies   Patient has no known allergies.   Review of Systems Review of Systems  Respiratory:  Positive for cough.    Per HPI  Physical Exam Triage Vital Signs ED Triage Vitals  Encounter Vitals Group     BP 11/26/24 1534 (!) 86/62     Girls Systolic BP Percentile --      Girls Diastolic BP Percentile --      Boys Systolic BP Percentile --      Boys Diastolic BP Percentile --      Pulse Rate 11/26/24 1534 85     Resp 11/26/24 1534 18     Temp 11/26/24 1534 97.9 F (36.6 C)     Temp Source 11/26/24 1534 Oral     SpO2 11/26/24 1534 94 %     Weight --      Height --      Head Circumference --      Peak Flow --      Pain Score 11/26/24 1533 4     Pain Loc --      Pain Education --      Exclude from Growth Chart --    No data found.  Updated Vital Signs BP (!) 86/62 (BP Location: Left Arm)    Pulse 85   Temp 97.9 F (36.6 C) (Oral)   Resp 18   LMP  (LMP Unknown)   SpO2 94%   Visual Acuity Right Eye Distance:   Left Eye Distance:   Bilateral Distance:    Right Eye Near:   Left Eye Near:    Bilateral Near:     Physical Exam Vitals and nursing note reviewed.  Constitutional:      General: She is awake. She is not in acute distress.    Appearance: Normal appearance. She is well-developed and well-groomed. She is not ill-appearing, toxic-appearing or diaphoretic.  HENT:     Right Ear: Tympanic membrane, ear canal and external ear normal.     Left Ear: Tympanic membrane, ear canal and external ear normal.     Nose: Congestion and rhinorrhea present.     Mouth/Throat:     Mouth: Mucous membranes are moist.     Pharynx: Posterior oropharyngeal erythema and postnasal drip present. No oropharyngeal exudate.  Cardiovascular:     Rate and Rhythm: Normal rate and regular rhythm.  Pulmonary:     Effort: Pulmonary effort is normal.     Breath sounds: Examination of the right-upper field reveals wheezing. Examination of the right-middle field reveals wheezing. Examination of the right-lower field reveals wheezing. Wheezing present.  Skin:    General: Skin is warm and dry.  Neurological:     Mental Status: She is alert.  Psychiatric:        Behavior: Behavior is cooperative.      UC Treatments / Results  Labs (all labs ordered are listed, but only abnormal results are displayed) Labs Reviewed - No data to display  EKG   Radiology DG Chest 2 View Result Date: 11/26/2024 CLINICAL DATA:  Shortness of breath and cough. EXAM: DG CHEST 2V COMPARISON:  Chest radiograph dated 11/22/2024. FINDINGS: No focal consolidation, pleural effusion or pneumothorax. Mild cardiomegaly. Left pectoral AICD device. No acute osseous pathology. IMPRESSION: No active cardiopulmonary disease. Electronically Signed   By: Vanetta Chou M.D.   On: 11/26/2024 16:50    Procedures Procedures  (including critical care time)  Medications Ordered in  UC Medications  ipratropium-albuterol  (DUONEB) 0.5-2.5 (3) MG/3ML nebulizer solution 3 mL (3 mLs Nebulization Given 11/26/24 1625)    Initial Impression / Assessment and Plan / UC Course  I have reviewed the triage vital signs and the nursing notes.  Pertinent labs & imaging results that were available during my care of the patient were reviewed by me and considered in my medical decision making (see chart for details).     Patient is overall well-appearing.  Vitals are overall stable.  Patient was initially mildly hypotensive 86/62.  I rechecked this and it was 97/70.  Diffuse wheezing auscultated throughout right lung.  Repeated chest x-ray to ensure that patient has not developed a pneumonia since previous visit due to significant wheezing auscultated in right lobe.  I independently interpreted these images and there is no active cardiopulmonary disease still at this time.  Radiology report confirms this.  DuoNeb given in clinic with significant relief of wheezing as well as symptomatic relief.  Suspect symptoms are likely related to a bronchitis.  Prescribed short course of prednisone  to assist with this.  Prescribed Tessalon  and Promethazine  DM as needed for cough.  Discussed when to use albuterol  inhaler.  Discussed follow-up and return precautions. Final Clinical Impressions(s) / UC Diagnoses   Final diagnoses:  Acute cough  Acute bronchitis, unspecified organism     Discharge Instructions      Your x-ray was negative for any underlying pneumonia.  I believe your symptoms are likely related to a bronchitis. Start taking 2 tablets of prednisone  once daily to help with wheezing and inflammation in the lungs. Take Tessalon  every 8 hours as needed for cough. Take Promethazine  DM cough syrup at bedtime as needed for cough.  This can make you drowsy so do not drive, work, or drink alcohol while taking this. You can use the  albuterol  inhaler every 4-6 hours as needed for shortness of breath and wheezing. Follow-up with your primary care provider or return here as needed.   ED Prescriptions     Medication Sig Dispense Auth. Provider   predniSONE  (DELTASONE ) 20 MG tablet Take 2 tablets (40 mg total) by mouth daily for 5 days. 10 tablet Johnie Flaming A, NP   benzonatate  (TESSALON ) 100 MG capsule Take 1 capsule (100 mg total) by mouth every 8 (eight) hours. 21 capsule Johnie Flaming A, NP   promethazine -dextromethorphan (PROMETHAZINE -DM) 6.25-15 MG/5ML syrup Take 5 mLs by mouth at bedtime as needed for cough. 118 mL Johnie Flaming A, NP      PDMP not reviewed this encounter.    [1]  Social History Tobacco Use   Smoking status: Never   Smokeless tobacco: Never  Vaping Use   Vaping status: Never Used  Substance Use Topics   Alcohol use: Yes    Comment: occasionally   Drug use: Never     Johnie Flaming LABOR, NP 11/26/24 1722  "

## 2024-11-26 NOTE — ED Triage Notes (Signed)
 Pt reports over week ago had cough, congestion, body aches. Reports saw PCP who sent her for xray few days ago. Medications help then symptoms return.

## 2024-12-02 ENCOUNTER — Other Ambulatory Visit (INDEPENDENT_AMBULATORY_CARE_PROVIDER_SITE_OTHER): Payer: Self-pay | Admitting: Primary Care

## 2024-12-02 ENCOUNTER — Ambulatory Visit (INDEPENDENT_AMBULATORY_CARE_PROVIDER_SITE_OTHER): Payer: Self-pay | Admitting: Primary Care

## 2024-12-02 DIAGNOSIS — J209 Acute bronchitis, unspecified: Secondary | ICD-10-CM

## 2024-12-02 MED ORDER — SULFAMETHOXAZOLE-TRIMETHOPRIM 800-160 MG PO TABS: 1.0000 | ORAL_TABLET | Freq: Two times a day (BID) | ORAL | 0 refills | Status: AC

## 2024-12-02 MED ORDER — FLUCONAZOLE 150 MG PO TABS: 150.0000 mg | ORAL_TABLET | Freq: Every day | ORAL | 1 refills | Status: AC

## 2024-12-08 ENCOUNTER — Encounter

## 2024-12-13 ENCOUNTER — Encounter

## 2024-12-22 ENCOUNTER — Other Ambulatory Visit (HOSPITAL_COMMUNITY): Payer: Self-pay

## 2024-12-22 NOTE — Progress Notes (Signed)
 Paramedicine Encounter    Patient ID: Sydney Shaffer, female    DOB: Oct 04, 1966, 59 y.o.   MRN: [id number]   Complaints-denies  Edema-none   Compliance with meds-yes  Pill box filled-bubble packs If so, by whom-pharmacy   Refills needed-n/a      Pt reports she is doing ok. She is getting over bronchitis, has a couple days left on antibiotic.  She denies increased sob, no dizziness, no c/p. Lungs clear.  She is no longer at her part time job due to it possibly interfering with medicaid/disability. Meds are in bubble packs.  Tried to set up appoint in clinic but the sch is not out yet.  No other issues or concerns at this time.    LMP  (LMP Unknown)  Weight yesterday-? Last visit weight-195  Patient Care Team: Celestia Rosaline SQUIBB, NP as PCP - General (Internal Medicine)  Patient Active Problem List   Diagnosis Date Noted   Dilated cardiomyopathy (HCC) 06/04/2015   Hypokalemia    HTN (hypertension) 03/30/2014   Chest pressure- unspecified 02/03/2014   Atrial tachycardia 10/01/2012   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   Hypertensive heart disease with CHF (congestive heart failure) (HCC) 08/04/2012   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   Anemia 03/29/2012   Current Medications[1] Allergies[2]    Social History   Socioeconomic History   Marital status: Single    Spouse name: Not on file   Number of children: 3   Years of education: Not on file   Highest education level: Not on file  Occupational History   Occupation: receptionist    Employer: [employer]   Occupation: office cleaning  Tobacco Use   Smoking status: Never   Smokeless tobacco: Never  Vaping Use   Vaping status: Never Used  Substance and Sexual Activity   Alcohol use: Yes    Comment: occasionally   Drug use: Never   Sexual activity: Not Currently  Other Topics Concern   Not on file  Social History Narrative   Not on file   Social Drivers of Health   Tobacco  Use: Low Risk (11/21/2024)   Patient History    Smoking Tobacco Use: Never    Smokeless Tobacco Use: Never    Passive Exposure: Not on file  Financial Resource Strain: High Risk (07/22/2024)   Overall Financial Resource Strain (CARDIA)    Difficulty of Paying Living Expenses: Hard  Food Insecurity: Food Insecurity Present (08/11/2024)   Epic    Worried About Programme Researcher, Broadcasting/film/video in the Last Year: Sometimes true    Ran Out of Food in the Last Year: Sometimes true  Transportation Needs: No Transportation Needs (07/02/2022)   PRAPARE - Administrator, Civil Service (Medical): No    Lack of Transportation (Non-Medical): No  Physical Activity: Not on file  Stress: Not on file  Social Connections: Not on file  Intimate Partner Violence: Not on file  Depression (EYV7-0): Low Risk (11/21/2024)   Depression (PHQ2-9)    PHQ-2 Score: 0  Alcohol Screen: Not on file  Housing: High Risk (01/12/2024)   Housing Stability Vital Sign    Unable to Pay for Housing in the Last Year: Yes    Number of Times Moved in the Last Year: Not on file    Homeless in the Last Year: No  Utilities: At Risk (01/28/2024)   AHC Utilities    Threatened with loss of utilities: Yes  Health Literacy: Not on file    Physical Exam  Future Appointments  Date Time Provider Department Center  12/23/2024  7:00 AM CVD HVT DEVICE REMOTES [facility]  03/24/2025  7:00 AM [facility] HVT DEVICE REMOTES [facility]  06/23/2025  7:00 AM [facility] HVT DEVICE REMOTES [facility]       Izetta Quivers, Paramedic [phone] [facility] Paramedic  12/22/24    [1]  Current Outpatient Medications:    acetaminophen  (TYLENOL ) 500 MG tablet, Take 1,000 mg by mouth every 6 (six) hours as needed for moderate pain., Disp: , Rfl:    albuterol  (VENTOLIN  HFA) 108 (90 Base) MCG/ACT inhaler, Inhale 2 puffs into the lungs every 6 (six) hours as needed for wheezing or shortness of breath., Disp: 18 each, Rfl: 2   amiodarone   (PACERONE ) 200 MG tablet, Take 0.5 tablets (100 mg total) by mouth 3 (three) times a week. Monday, Wednesday and Friday, Disp: 45 tablet, Rfl: 3   benzonatate  (TESSALON ) 100 MG capsule, Take 1 capsule (100 mg total) by mouth every 8 (eight) hours., Disp: 21 capsule, Rfl: 0   carvedilol  (COREG ) 3.125 MG tablet, TAKE ONE TABLET BY MOUTH TWICE DAILY (AM+BEDTIME), Disp: 60 tablet, Rfl: 3   cyclobenzaprine  (FLEXERIL ) 10 MG tablet, Take 1 tablet (10 mg total) by mouth 3 (three) times daily as needed for muscle spasms., Disp: 20 tablet, Rfl: 0   dapagliflozin  propanediol (FARXIGA ) 10 MG TABS tablet, TAKE 1 TABLET (10 MG TOTAL) BY MOUTH DAILY., Disp: 30 tablet, Rfl: 6   digoxin  (LANOXIN ) 0.125 MG tablet, TAKE 1 TABLET BY MOUTH DAILY, Disp: 90 tablet, Rfl: 3   doxylamine , Sleep, (UNISOM ) 25 MG tablet, Take 25 mg by mouth at bedtime as needed for sleep., Disp: , Rfl:    ELIQUIS  5 MG TABS tablet, TAKE ONE TABLET BY MOUTH TWICE DAILY (AM+BEDTIME), Disp: 60 tablet, Rfl: 6   ferrous sulfate  325 (65 FE) MG tablet, Take 325 mg by mouth daily with breakfast., Disp: , Rfl:    fluconazole  (DIFLUCAN ) 150 MG tablet, Take 1 tablet (150 mg total) by mouth daily., Disp: 1 tablet, Rfl: 1   fluticasone  (FLONASE ) 50 MCG/ACT nasal spray, Place 2 sprays into both nostrils daily., Disp: 16 g, Rfl: 6   losartan  (COZAAR ) 25 MG tablet, TAKE ONE TABLET BY MOUTH TWICE DAILY (AM+BEDTIME), Disp: 60 tablet, Rfl: 3   Multiple Vitamin (MULTIVITAMIN WITH MINERALS) TABS, Take 1 tablet by mouth daily., Disp: , Rfl:    potassium chloride  SA (KLOR-CON  M) 20 MEQ tablet, TAKE 4 TABS EVERY MORNING AND 4 TABS EVERY EVENING (2AM+2NOON+2PM+2BEDTIME), Disp: 240 tablet, Rfl: 11   promethazine -dextromethorphan (PROMETHAZINE -DM) 6.25-15 MG/5ML syrup, Take 5 mLs by mouth at bedtime as needed for cough., Disp: 118 mL, Rfl: 0   spironolactone  (ALDACTONE ) 25 MG tablet, TAKE 1 TABLET BY MOUTH DAILY **HOLD IF SYSTOLIC BLOOD PRESSURE LESS THAN 100 (BEDTIME),  Disp: 45 tablet, Rfl: 3   sulfamethoxazole -trimethoprim  (BACTRIM  DS) 800-160 MG tablet, Take 1 tablet by mouth 2 (two) times daily., Disp: 20 tablet, Rfl: 0   torsemide  (DEMADEX ) 20 MG tablet, TAKE 4 TABLETS BY MOUTH IN THE MORNING AND 2 TABLETS EVERY EVENING (4AM+2NOON), Disp: 545 tablet, Rfl: 2 [2] No Known Allergies

## 2024-12-23 ENCOUNTER — Ambulatory Visit

## 2024-12-23 DIAGNOSIS — I420 Dilated cardiomyopathy: Secondary | ICD-10-CM

## 2024-12-27 ENCOUNTER — Encounter

## 2024-12-28 LAB — CUP PACEART REMOTE DEVICE CHECK
Battery Remaining Longevity: 68 mo
Battery Voltage: 2.98 V
Brady Statistic AP VP Percent: 0 %
Brady Statistic AP VS Percent: 0.01 %
Brady Statistic AS VP Percent: 0 %
Brady Statistic AS VS Percent: 99.99 %
Brady Statistic RA Percent Paced: 0.01 %
Brady Statistic RV Percent Paced: 0 %
Date Time Interrogation Session: 20260121131946
HighPow Impedance: 71 Ohm
Implantable Lead Connection Status: 753985
Implantable Lead Connection Status: 753985
Implantable Lead Implant Date: 20130906
Implantable Lead Implant Date: 20130906
Implantable Lead Location: 753859
Implantable Lead Location: 753860
Implantable Lead Model: 181
Implantable Lead Model: 5076
Implantable Lead Serial Number: 322962
Implantable Pulse Generator Implant Date: 20201207
Lead Channel Impedance Value: 342 Ohm
Lead Channel Impedance Value: 361 Ohm
Lead Channel Impedance Value: 361 Ohm
Lead Channel Pacing Threshold Amplitude: 0.5 V
Lead Channel Pacing Threshold Amplitude: 1.625 V
Lead Channel Pacing Threshold Pulse Width: 0.4 ms
Lead Channel Pacing Threshold Pulse Width: 0.4 ms
Lead Channel Sensing Intrinsic Amplitude: 1.75 mV
Lead Channel Sensing Intrinsic Amplitude: 1.75 mV
Lead Channel Sensing Intrinsic Amplitude: 11.75 mV
Lead Channel Sensing Intrinsic Amplitude: 11.75 mV
Lead Channel Setting Pacing Amplitude: 1.5 V
Lead Channel Setting Pacing Amplitude: 2.5 V
Lead Channel Setting Pacing Pulse Width: 1 ms
Lead Channel Setting Sensing Sensitivity: 0.3 mV
Zone Setting Status: 755011

## 2024-12-29 ENCOUNTER — Ambulatory Visit: Payer: Self-pay | Admitting: Cardiology

## 2024-12-30 ENCOUNTER — Other Ambulatory Visit (INDEPENDENT_AMBULATORY_CARE_PROVIDER_SITE_OTHER): Payer: Self-pay | Admitting: Primary Care

## 2024-12-30 ENCOUNTER — Other Ambulatory Visit (HOSPITAL_COMMUNITY): Payer: Self-pay | Admitting: Internal Medicine

## 2024-12-30 DIAGNOSIS — J209 Acute bronchitis, unspecified: Secondary | ICD-10-CM

## 2024-12-30 NOTE — Telephone Encounter (Signed)
 Requested medication (s) are due for refill today: Yes  Requested medication (s) are on the active medication list: Yes  Last refill:  12/02/24  Future visit scheduled: No  Notes to clinic:  Unable to refill due to no refill protocol for this medication.      Requested Prescriptions  Pending Prescriptions Disp Refills   fluconazole  (DIFLUCAN ) 150 MG tablet [Pharmacy Med Name: FLUCONAZOLE  150 MG ORAL TABLET] 1 tablet 1    Sig: Take 1 tablet (150 mg total) by mouth daily.     Off-Protocol Failed - 12/30/2024  5:00 PM      Failed - Medication not assigned to a protocol, review manually.      Passed - Valid encounter within last 12 months    Recent Outpatient Visits           1 month ago Acute upper respiratory infection   [place] [facility] Celestia Rosaline SQUIBB, NP   2 years ago Need for immunization against influenza   [place] [facility] Celestia Rosaline SQUIBB, NP

## 2024-12-30 NOTE — Progress Notes (Signed)
 Remote ICD Transmission

## 2025-01-12 ENCOUNTER — Other Ambulatory Visit (HOSPITAL_COMMUNITY): Payer: Self-pay

## 2025-01-12 NOTE — Progress Notes (Unsigned)
 Paramedicine Encounter    Patient ID: Sydney Shaffer, female    DOB: 08/05/1966, 59 y.o.   MRN: [id number]   Complaints-cough-productive green phlegm for over 30 days   Edema-none  Compliance with meds-yes  Pill box filled-n/a  If so, by whom-bubble packs  Refills needed-none     Pt reports she is still having this lingering cough and its been over 30 days. She reports that it does have green production at times. She has reached out to PCP for this and if she doesn't hear back soon then she will go back to urgent care.  She is still having the pain around her rt rib cage area as well. She does have mild coarse lung sounds to the rt upper and lower.   She does report appetite ok. No fevers. No c/p, no increased sob, no dizziness.  Meds are good-still in bubble packs. No issues or concerns with that.  No bleeding issues.  Will f/u in a few wks.  Will send message to clinic-she is due for f/u with echo in April..   BP 102/70   Pulse 90   Resp 15   Wt 189 lb (85.7 kg)   LMP  (LMP Unknown)   SpO2 97%   BMI 24.27 kg/m  Weight yesterday-? 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    Occupation: office cleaning  Tobacco Use   Smoking status: Never   Smokeless tobacco: Never  Vaping Use   Vaping status: Never Used  Substance and Sexual Activity   Alcohol use: Yes    Comment: occasionally   Drug use: Never   Sexual activity: Not Currently  Other Topics Concern   Not on file  Social History Narrative   Not on file   Social Drivers of Health   Tobacco Use: Low Risk (11/21/2024)   Patient History    Smoking Tobacco Use: Never    Smokeless Tobacco Use: Never    Passive Exposure: Not on file  Financial Resource Strain: High Risk (07/22/2024)   Overall Financial Resource Strain (CARDIA)    Difficulty of Paying Living Expenses: Hard  Food Insecurity: Food Insecurity Present (08/11/2024)   Epic    Worried About Programme Researcher, Broadcasting/film/video in the Last Year: Sometimes true    Ran Out of Food in the Last Year: Sometimes true  Transportation Needs: No Transportation Needs (07/02/2022)   PRAPARE - Administrator, Civil Service (Medical): No    Lack of Transportation (Non-Medical): No  Physical Activity: Not on file  Stress: Not on file  Social Connections: Not on file  Intimate Partner Violence: Not on file  Depression (PHQ2-9): Low Risk (11/21/2024)   Depression (PHQ2-9)  PHQ-2 Score: 0  Alcohol Screen: Not on file  Housing: High Risk (01/12/2024)   Housing Stability Vital Sign    Unable to Pay for Housing in the Last Year: Yes    Number of Times Moved in the Last Year: Not on file    Homeless in the Last Year: No  Utilities: At Risk (01/28/2024)   [facility] Utilities    Threatened with loss of utilities: Yes  Health Literacy: Not on file    Physical Exam      Future Appointments  Date Time Provider Department Center  03/24/2025  7:00 AM [facility] H[facility] DEVICE [facility]  06/23/2025  7:00 AM [facility] DEVICE REMOT[facility]       Izetta Quivers, Paramedic [phone] [facility] Paramedic  01/12/25      [1]  Current Outpatient  Medications:    acetaminophen  (TYLENOL ) 500 MG tablet, Take 1,000 mg by mouth every 6 (six) hours as needed for moderate pain., Disp: , Rfl:    albuterol  (VENTOLIN  HFA) 108 (90 Base) MCG/ACT inhaler, Inhale 2 puffs into the lungs every 6 (six) hours as needed for wheezing or shortness of breath., Disp: 18 each, Rfl: 2   amiodarone  (PACERONE ) 200 MG tablet, Take 0.5 tablets (100 mg total) by mouth 3 (three) times a week. Monday, Wednesday and Friday, Disp: 45 tablet, Rfl: 3   benzonatate  (TESSALON ) 100 MG capsule, Take 1 capsule (100 mg total) by mouth every 8 (eight) hours., Disp: 21 capsule, Rfl: 0   carvedilol  (COREG ) 3.125 MG tablet, TAKE ONE TABLET BY MOUTH TWICE DAILY (AM+BEDTIME), Disp: 60 tablet, Rfl: 3   cyclobenzaprine  (FLEXERIL ) 10 MG tablet, Take 1 tablet (10 mg total) by mouth 3 (three) times daily as needed for muscle spasms., Disp: 20 tablet, Rfl: 0   dapagliflozin  propanediol (FARXIGA ) 10 MG TABS tablet, TAKE 1 TABLET (10 MG TOTAL) BY MOUTH DAILY., Disp: 30 tablet, Rfl: 6   digoxin  (LANOXIN ) 0.125 MG tablet, TAKE 1 TABLET BY MOUTH DAILY, Disp: 90 tablet, Rfl: 3   doxylamine , Sleep, (UNISOM ) 25 MG tablet, Take 25 mg by mouth at bedtime as needed for sleep., Disp: , Rfl:    ELIQUIS  5 MG TABS tablet, TAKE ONE TABLET BY MOUTH TWICE DAILY (AM+BEDTIME), Disp: 60 tablet, Rfl: 6   ferrous sulfate  325 (65 FE) MG tablet, Take 325 mg by mouth daily with breakfast., Disp: , Rfl:    fluconazole  (DIFLUCAN ) 150 MG tablet, Take 1 tablet (150 mg total) by mouth daily., Disp: 1 tablet, Rfl: 1   fluticasone  (FLONASE ) 50 MCG/ACT nasal spray, Place 2 sprays into both nostrils daily., Disp: 16 g, Rfl: 6   losartan  (COZAAR ) 25 MG tablet, TAKE ONE TABLET BY MOUTH TWICE DAILY (AM+BEDTIME), Disp: 60 tablet, Rfl: 3   Multiple Vitamin (MULTIVITAMIN WITH MINERALS) TABS, Take 1 tablet by mouth daily., Disp: , Rfl:    potassium chloride  SA (KLOR-CON  M) 20 MEQ tablet, TAKE 4 TABS EVERY MORNING AND 4 TABS EVERY  EVENING (2AM+2NOON+2PM+2BEDTIME), Disp: 240 tablet, Rfl: 11   promethazine -dextromethorphan (PROMETHAZINE -DM) 6.25-15 MG/5ML syrup, Take 5 mLs by mouth at bedtime as needed for cough., Disp: 118 mL, Rfl: 0   spironolactone  (ALDACTONE ) 25 MG tablet, TAKE 1 TABLET BY MOUTH DAILY **HOLD IF SYSTOLIC BLOOD PRESSURE LESS THAN 100 (BEDTIME), Disp: 45 tablet, Rfl: 3   sulfamethoxazole -trimethoprim  (BACTRIM  DS) 800-160 MG tablet, Take 1 tablet by mouth 2 (two) times daily., Disp: 20 tablet, Rfl: 0   torsemide  (DEMADEX ) 20 MG tablet, TAKE 4 TABLETS  BY MOUTH IN THE MORNING AND 2 TABLETS EVERY EVENING (4AM+2NOON), Disp: 545 tablet, Rfl: 2 [2] No Known Allergies

## 2025-03-09 ENCOUNTER — Encounter

## 2025-03-14 ENCOUNTER — Encounter

## 2025-03-24 ENCOUNTER — Encounter

## 2025-03-28 ENCOUNTER — Encounter

## 2025-04-10 ENCOUNTER — Ambulatory Visit (HOSPITAL_COMMUNITY): Admitting: Internal Medicine

## 2025-04-10 ENCOUNTER — Other Ambulatory Visit (HOSPITAL_COMMUNITY)

## 2025-06-08 ENCOUNTER — Encounter

## 2025-06-13 ENCOUNTER — Encounter

## 2025-06-23 ENCOUNTER — Encounter

## 2025-06-27 ENCOUNTER — Encounter

## 2025-09-07 ENCOUNTER — Encounter

## 2025-09-12 ENCOUNTER — Encounter
